# Patient Record
Sex: Female | Born: 1950
Health system: Southern US, Community
[De-identification: ages and names within clinical notes are randomized; demographics above are authoritative.]

## PROBLEM LIST (undated history)

## (undated) DIAGNOSIS — G5603 Carpal tunnel syndrome, bilateral upper limbs: Secondary | ICD-10-CM

## (undated) DIAGNOSIS — M7071 Other bursitis of hip, right hip: Secondary | ICD-10-CM

## (undated) DIAGNOSIS — H269 Unspecified cataract: Secondary | ICD-10-CM

## (undated) DIAGNOSIS — M2420 Disorder of ligament, unspecified site: Secondary | ICD-10-CM

## (undated) DIAGNOSIS — K219 Gastro-esophageal reflux disease without esophagitis: Secondary | ICD-10-CM

## (undated) DIAGNOSIS — M4800 Spinal stenosis, site unspecified: Secondary | ICD-10-CM

## (undated) DIAGNOSIS — M722 Plantar fascial fibromatosis: Secondary | ICD-10-CM

## (undated) DIAGNOSIS — N3010 Interstitial cystitis (chronic) without hematuria: Secondary | ICD-10-CM

## (undated) DIAGNOSIS — Z974 Presence of external hearing-aid: Secondary | ICD-10-CM

## (undated) DIAGNOSIS — IMO0002 Reserved for concepts with insufficient information to code with codable children: Secondary | ICD-10-CM

## (undated) DIAGNOSIS — R202 Paresthesia of skin: Secondary | ICD-10-CM

## (undated) DIAGNOSIS — M1990 Unspecified osteoarthritis, unspecified site: Secondary | ICD-10-CM

## (undated) DIAGNOSIS — F32A Depression, unspecified: Secondary | ICD-10-CM

## (undated) DIAGNOSIS — R011 Cardiac murmur, unspecified: Secondary | ICD-10-CM

## (undated) DIAGNOSIS — M75122 Complete rotator cuff tear or rupture of left shoulder, not specified as traumatic: Secondary | ICD-10-CM

## (undated) DIAGNOSIS — T7840XA Allergy, unspecified, initial encounter: Secondary | ICD-10-CM

## (undated) DIAGNOSIS — F419 Anxiety disorder, unspecified: Secondary | ICD-10-CM

## (undated) DIAGNOSIS — N281 Cyst of kidney, acquired: Secondary | ICD-10-CM

## (undated) DIAGNOSIS — M4322 Fusion of spine, cervical region: Secondary | ICD-10-CM

## (undated) DIAGNOSIS — M8580 Other specified disorders of bone density and structure, unspecified site: Secondary | ICD-10-CM

## (undated) HISTORY — PX: CYSTOSCOPY: SUR368

## (undated) HISTORY — PX: CARPAL TUNNEL RELEASE: SHX101

## (undated) HISTORY — PX: TONSILLECTOMY: SUR1361

## (undated) HISTORY — DX: Allergy, unspecified, initial encounter: T78.40XA

## (undated) HISTORY — DX: Depression, unspecified: F32.A

## (undated) HISTORY — DX: Complete rotator cuff tear or rupture of left shoulder, not specified as traumatic: M75.122

## (undated) HISTORY — PX: TYMPANOSTOMY TUBE PLACEMENT: SHX32

## (undated) HISTORY — DX: Spinal stenosis, site unspecified: M48.00

## (undated) HISTORY — DX: Carpal tunnel syndrome, bilateral upper limbs: G56.03

## (undated) HISTORY — DX: Disorder of ligament, unspecified site: M24.20

## (undated) HISTORY — DX: Unspecified cataract: H26.9

## (undated) HISTORY — DX: Gastro-esophageal reflux disease without esophagitis: K21.9

## (undated) HISTORY — PX: MYRINGOTOMY: SUR874

## (undated) HISTORY — DX: Other bursitis of hip, right hip: M70.71

## (undated) HISTORY — PX: TRIGGER FINGER RELEASE: SHX641

## (undated) HISTORY — DX: Interstitial cystitis (chronic) without hematuria: N30.10

## (undated) HISTORY — PX: LUMBAR LAMINECTOMY: SHX95

## (undated) HISTORY — PX: NASAL SINUS SURGERY: SHX719

## (undated) HISTORY — DX: Plantar fascial fibromatosis: M72.2

## (undated) HISTORY — PX: ANAL SPHINCTEROTOMY: SHX1140

## (undated) HISTORY — DX: Presence of external hearing-aid: Z97.4

## (undated) HISTORY — DX: Unspecified osteoarthritis, unspecified site: M19.90

## (undated) HISTORY — PX: SPINE SURGERY: SHX786

---

## 1996-02-18 HISTORY — PX: HEMORRHOID SURGERY: SHX153

## 1998-01-19 ENCOUNTER — Ambulatory Visit (HOSPITAL_COMMUNITY): Admission: RE | Admit: 1998-01-19 | Discharge: 1998-01-19 | Payer: Self-pay | Admitting: Internal Medicine

## 1998-01-19 ENCOUNTER — Encounter: Payer: Self-pay | Admitting: Internal Medicine

## 1998-05-07 ENCOUNTER — Other Ambulatory Visit: Admission: RE | Admit: 1998-05-07 | Discharge: 1998-05-07 | Payer: Self-pay | Admitting: Obstetrics and Gynecology

## 1999-05-23 ENCOUNTER — Encounter: Payer: Self-pay | Admitting: Internal Medicine

## 1999-05-23 ENCOUNTER — Ambulatory Visit (HOSPITAL_COMMUNITY): Admission: RE | Admit: 1999-05-23 | Discharge: 1999-05-23 | Payer: Self-pay | Admitting: Internal Medicine

## 1999-06-06 ENCOUNTER — Other Ambulatory Visit: Admission: RE | Admit: 1999-06-06 | Discharge: 1999-06-06 | Payer: Self-pay | Admitting: Obstetrics and Gynecology

## 2000-11-02 ENCOUNTER — Other Ambulatory Visit: Admission: RE | Admit: 2000-11-02 | Discharge: 2000-11-02 | Payer: Self-pay | Admitting: Obstetrics and Gynecology

## 2001-02-17 HISTORY — PX: ABDOMINAL HYSTERECTOMY: SHX81

## 2001-09-30 ENCOUNTER — Ambulatory Visit (HOSPITAL_COMMUNITY): Admission: RE | Admit: 2001-09-30 | Discharge: 2001-09-30 | Payer: Self-pay | Admitting: Gastroenterology

## 2001-10-01 ENCOUNTER — Emergency Department (HOSPITAL_COMMUNITY): Admission: EM | Admit: 2001-10-01 | Discharge: 2001-10-02 | Payer: Self-pay | Admitting: *Deleted

## 2001-10-21 ENCOUNTER — Other Ambulatory Visit: Admission: RE | Admit: 2001-10-21 | Discharge: 2001-10-21 | Payer: Self-pay | Admitting: Obstetrics and Gynecology

## 2001-11-16 ENCOUNTER — Inpatient Hospital Stay (HOSPITAL_COMMUNITY): Admission: RE | Admit: 2001-11-16 | Discharge: 2001-11-18 | Payer: Self-pay | Admitting: Obstetrics and Gynecology

## 2001-11-16 ENCOUNTER — Encounter (INDEPENDENT_AMBULATORY_CARE_PROVIDER_SITE_OTHER): Payer: Self-pay

## 2001-11-18 ENCOUNTER — Other Ambulatory Visit: Admission: RE | Admit: 2001-11-18 | Discharge: 2001-11-18 | Payer: Self-pay | Admitting: Obstetrics and Gynecology

## 2005-09-02 ENCOUNTER — Ambulatory Visit: Payer: Self-pay | Admitting: Licensed Clinical Social Worker

## 2005-09-23 ENCOUNTER — Ambulatory Visit: Payer: Self-pay | Admitting: Licensed Clinical Social Worker

## 2005-09-30 ENCOUNTER — Ambulatory Visit (HOSPITAL_COMMUNITY): Admission: RE | Admit: 2005-09-30 | Discharge: 2005-09-30 | Payer: Self-pay | Admitting: Dermatology

## 2005-09-30 ENCOUNTER — Ambulatory Visit: Payer: Self-pay | Admitting: Licensed Clinical Social Worker

## 2005-10-07 ENCOUNTER — Ambulatory Visit: Payer: Self-pay | Admitting: Licensed Clinical Social Worker

## 2005-10-14 ENCOUNTER — Ambulatory Visit: Payer: Self-pay | Admitting: Licensed Clinical Social Worker

## 2005-12-08 ENCOUNTER — Other Ambulatory Visit: Admission: RE | Admit: 2005-12-08 | Discharge: 2005-12-08 | Payer: Self-pay | Admitting: Obstetrics & Gynecology

## 2006-09-16 ENCOUNTER — Ambulatory Visit (HOSPITAL_COMMUNITY): Admission: RE | Admit: 2006-09-16 | Discharge: 2006-09-17 | Payer: Self-pay | Admitting: Specialist

## 2008-01-26 ENCOUNTER — Other Ambulatory Visit: Admission: RE | Admit: 2008-01-26 | Discharge: 2008-01-26 | Payer: Self-pay | Admitting: Obstetrics & Gynecology

## 2008-07-14 ENCOUNTER — Encounter: Admission: RE | Admit: 2008-07-14 | Discharge: 2008-07-14 | Payer: Self-pay | Admitting: Otolaryngology

## 2010-07-02 NOTE — Op Note (Signed)
Helen Glover, Helen Glover               ACCOUNT NO.:  [id number]   MEDICAL RECORD NO.:  [id number]          PATIENT TYPE:  AMB   LOCATION:  DAY                          FACILITY:  [facility]   PHYSICIAN:  Jene Every, M.D.    DATE OF BIRTH:  01-Mar-1950   DATE OF PROCEDURE:  09/16/2006  DATE OF DISCHARGE:                               OPERATIVE REPORT   PREOPERATIVE DIAGNOSIS:  Spinal stenosis L4-L5, bilateral.   POSTOPERATIVE DIAGNOSIS:  Spinal stenosis L4-L5, bilateral.   PROCEDURE PERFORMED:  Bilateral hemilaminotomy, lateral recess  decompression, foraminotomy.   ANESTHESIA:  General.   SURGEON:  Jene Every, M.D.   ASSISTANT:  Roma Schanz, P.A.-C.   BRIEF HISTORY:  The patient is a 60 year old with bilateral L5  radiculopathy and neurogenic claudication, EHL weakness and MRI  indicating stenosis at L4-L5 in the lateral recess fairly superiorly.  She had undergone epidural steroid injections with temporary relief.  Due to the persistence of her symptoms and EHL weakness, she is  indicated for bilateral decompression of L4-L5.  She had an asymptomatic  foraminal disc herniation at L3-L4, so we elected not to proceed with  that given the lack of symptomatology and the need for facetectomy and  fusion to address that disc.  The risks and benefits have been discussed  including wound infection, damage to neurovascular structures, CSF  leakage, epidural fibrosis, need for fusion in the future, anesthetic  complications, etc.   SURGICAL TECHNIQUE:  The patient was placed in the supine position.  After the induction of adequate anesthesia, 1 gram Kefzol, she was  placed prone on the Coleman frame.  All bony prominences were well  padded.  The lumbar region was prepped and draped in the usual fashion.  A 20 gauge spinal needle was utilized to localize the L4-L5 interspace  and confirmed with x-ray.  An incision was made from the spinous process  of L4 to L5.  The subcutaneous  tissue was dissected and electrocautery  was utilized to achieve hemostasis.  The dorsal lumbar fascia was  identified and divided in line with the skin incision.  The paraspinous  muscle was elevated from the lamina of L4 and L5.  The operating  microscope was draped and brought into the surgical field.   Attention ws turned towards the more symptomatic side on the left.  A  Leksell rongeur and 3 mm Kerrison were utilized to perform a  hemilaminotomy of the caudad edge of L4 to the cephalad attachment of  the ligamentum flavum, preserving an adequate pars distance.  I then  performed a foraminotomy of L5.  There was significant stenosis of the  lateral aspect of the sac and the L5 nerve root into the lateral recess.  I decompressed the lateral recess in the lower pedicle.  I gently  mobilized the nerve root medially.  There was a bulging disc but it was  not herniated.  Bipolar electrocautery was utilized to achieve  hemostasis.  A hockey stick probe was then placed freely in the foramen  of L4 and L5.  We passed a probe freely into the  next interspace without  any significant stenosis noted.  In a similar fashion, we decompressed  the right side performing a hemilaminotomy at the caudad edge of L4 and  the cephalad edge of L5 performing foraminotomies of L5, protecting the  nerve root, gently mobilizing them medially, decompressing the lateral  recess to the medial border of the pedicle, there was significant  compression of the L5 nerve root and the lateral recess.  This was  multifactorial.  In preserving the pars, I examined the disc and there  was no disc herniation.  Electrocautery was utilized to achieve  hemostasis.  A hockey stick probe passed freely in the foramen of L4 and  L5 following the decompression and up to the interspace at L3-L4 without  significant stenosis noted there.   After copious irrigation, bone wax was placed on the cancellous  surfaces.  I re-examined the  disc bilaterally, there was no disc  herniation, no pars defect.  We had obtained a confirmatory radiograph  with the McCullough retractor at the space at L4-L5.  We then removed  the [facility] retractor.  The paraspinous muscle were inspected with no  evidence of active bleeding.  The dorsal lumbar fascia was  reapproximated with #1 Vicryl figure-of-eight sutures, the subcutaneous  tissues reapproximated with 2-0 Vicryl simple sutures, and the skin was  reapproximated with 0 Prolene.  The wound was reinforced with Steri-  Strips.  A sterile dressing was applied.  He was placed supine on the  hospital bed, extubated without difficulty, and transported to the  recovery room in satisfactory condition.  The patient tolerated the  procedure well with no complications.      Jene Every, M.D.  Electronically Signed     JB/MEDQ  D:  09/16/2006  T:  09/16/2006  Job:  [id number]

## 2010-12-02 LAB — TYPE AND SCREEN
ABO/RH(D): O POS
Antibody Screen: NEGATIVE

## 2010-12-02 LAB — HEMOGLOBIN AND HEMATOCRIT, BLOOD
HCT: 40
Hemoglobin: 13.7

## 2010-12-02 LAB — PROTIME-INR
INR: 1
Prothrombin Time: 12.8

## 2010-12-02 LAB — ABO/RH: ABO/RH(D): O POS

## 2010-12-02 LAB — APTT: aPTT: 29

## 2011-10-07 ENCOUNTER — Encounter: Payer: Self-pay | Admitting: Gastroenterology

## 2011-11-16 ENCOUNTER — Ambulatory Visit: Payer: 59 | Admitting: Emergency Medicine

## 2011-11-16 VITALS — BP 108/64 | HR 78 | Temp 98.2°F | Resp 16 | Ht 59.0 in | Wt 118.0 lb

## 2011-11-16 DIAGNOSIS — IMO0002 Reserved for concepts with insufficient information to code with codable children: Secondary | ICD-10-CM

## 2011-11-16 MED ORDER — AMOXICILLIN-POT CLAVULANATE 875-125 MG PO TABS: 1.0000 | ORAL_TABLET | Freq: Two times a day (BID) | ORAL | Status: DC

## 2011-11-16 NOTE — Progress Notes (Signed)
[facility] [address], [place] [address] [phone]  Date:  11/16/2011   Name:  Helen Glover   DOB:  1950-07-21   MRN:  [id number]  PCP:  Nadean Corwin, MD    Chief Complaint: Paronychia and URI   History of Present Illness:  Helen Glover is a 61 y.o. very pleasant female patient who presents with the following:  Has been biting her cuticles, particularly on the right third and middle fingers.  Has pain in third finger associated with redness and swelling around the proximal nail fold.  No drainage, no cellulitis or lymphangitis.  There is no problem list on file for this patient.   No past medical history on file.  No past surgical history on file.  History  Substance Use Topics  . Smoking status: Former Games developer  . Smokeless tobacco: Not on file  . Alcohol Use: Not on file    No family history on file.  Allergies  Allergen Reactions  . Codeine Hives  . Sulfa Antibiotics Nausea And Vomiting    Medication list has been reviewed and updated.  Current Outpatient Prescriptions on File Prior to Visit  Medication Sig Dispense Refill  . omeprazole (PRILOSEC) 40 MG capsule Take 40 mg by mouth daily.        Review of Systems:  As per HPI, otherwise negative.    Physical Examination: Filed Vitals:   11/16/11 1151  BP: 108/64  Pulse: 78  Temp: 98.2 F (36.8 C)  Resp: 16   Filed Vitals:   11/16/11 1151  Height: 4\' 11"  (1.499 m)  Weight: 118 lb (53.524 kg)   Body mass index is 23.83 kg/(m^2). Ideal Body Weight: Weight in (lb) to have BMI = 25: 123.5    GEN: WDWN, NAD, Non-toxic, Alert & Oriented x 3 HEENT: Atraumatic, Normocephalic.  Ears and Nose: No external deformity. EXTR: No clubbing/cyanosis/edema NEURO: Normal gait.  PSYCH: Normally interactive. Conversant. Not depressed or anxious appearing.  Calm demeanor.  Hand:  Paronychia right third finger.  NATI.  Assessment and Plan: Paronychia augmentin Warm  soaks Follow up as needed.  Carmelina Dane, MD   I have reviewed and agree with documentation. Robert P. Merla Riches, M.D.

## 2011-11-17 ENCOUNTER — Telehealth: Payer: Self-pay

## 2011-11-17 ENCOUNTER — Ambulatory Visit: Payer: 59 | Admitting: Physician Assistant

## 2011-11-17 VITALS — BP 120/80 | HR 81 | Temp 98.0°F | Resp 18 | Wt 122.0 lb

## 2011-11-17 DIAGNOSIS — M79609 Pain in unspecified limb: Secondary | ICD-10-CM

## 2011-11-17 DIAGNOSIS — M79646 Pain in unspecified finger(s): Secondary | ICD-10-CM

## 2011-11-17 DIAGNOSIS — K219 Gastro-esophageal reflux disease without esophagitis: Secondary | ICD-10-CM

## 2011-11-17 DIAGNOSIS — IMO0002 Reserved for concepts with insufficient information to code with codable children: Secondary | ICD-10-CM

## 2011-11-17 DIAGNOSIS — N3010 Interstitial cystitis (chronic) without hematuria: Secondary | ICD-10-CM | POA: Insufficient documentation

## 2011-11-17 NOTE — Progress Notes (Signed)
Subjective:    Patient ID: Helen Glover, female    DOB: Apr 16, 1950, 61 y.o.   MRN: [id number]  HPI This 61 y.o. female presents for evaluation of a paronychia.  She was seen yesterday  With pain and swelling of the right middle finger and was placed on Augmentin.  She had no improvement overnight and this morning noted an area of "white" next to the nail at the area of maximum tenderness.  She called for advice, and was advised to come in for evaluation.  She is tolerating the medication without any difficulty.  No fever, chills.     Review of Systems As above.   Past Medical History  Diagnosis Date  . GERD (gastroesophageal reflux disease)   . Interstitial cystitis   . Eagle's syndrome   . Spinal stenosis     Past Surgical History  Procedure Date  . Abdominal hysterectomy   . Cesarean section   . Myringotomy   . Tonsillectomy     LEFT only, due to Eagle Syndrome  . Lumbar laminectomy   . Carpal tunnel release     LEFT  . Trigger finger release     RIGHT  . Anal sphincterotomy   . Nasal sinus surgery     Prior to Admission medications   Medication Sig Start Date End Date Taking? Authorizing Provider  amoxicillin-clavulanate (AUGMENTIN) 875-125 MG per tablet Take 1 tablet by mouth 2 (two) times daily. 11/16/11  Yes Phillips Odor, MD  omeprazole (PRILOSEC) 40 MG capsule Take 40 mg by mouth daily.   Yes Historical Provider, MD    Allergies  Allergen Reactions  . Codeine Hives  . Sulfa Antibiotics Nausea And Vomiting    History   Social History  . Marital Status: Married    Spouse Name: Tinnie Gens    Number of Children: 2  . Years of Education: N/A   Occupational History  . OWNER     [employer]   Social History Main Topics  . Smoking status: Former Games developer  . Smokeless tobacco: Never Used  . Alcohol Use: 4.2 - 4.8 oz/week    7-8 Glasses of wine per week  . Drug Use: No  . Sexually Active: Yes -- Female partner(s)    Birth Control/ Protection:  Surgical   Other Topics Concern  . Not on file   Social History Narrative  . No narrative on file    Family History  Problem Relation Age of Onset  . Diabetes Mother   . Hypertension Mother   . Hypertension Brother   . Hyperlipidemia Brother        Objective:   Physical Exam  Blood pressure 120/80, pulse 81, temperature 98 F (36.7 C), temperature source Oral, resp. rate 18, weight 122 lb (55.339 kg). There is no height on file to calculate BMI. (height yesterday measured 4'11") Well-developed, well nourished WF who is awake, alert and oriented, in NAD. HEENT: [place]/AT, sclera and conjunctiva are clear.   Lungs: normal effort. Extremities: no cyanosis, clubbing.  Mild edema, erythema and moderate-severe tenderness with palpation of the distal middle finger.   Skin: warm and dry, as above. Psychologic: good mood and appropriate affect, normal speech and behavior.  PROCEDURE: Verbal consent obtained.  Prep with Betadine.  11 blade to incise.  Moderate purulence expressed.  Culture obtained.  Area cleansed.  Small bandage applied.     Assessment & Plan:   1. Paronychia  Wound culture  2. Pain in finger  Continue the antibiotic and local wound care.  Anticipatory guidance provided.

## 2011-11-17 NOTE — Telephone Encounter (Signed)
The patient called with concerns about her finger infection.  The patient states she has some whitish-greenish areas near the nail and would like a return call to discuss this.  Please call the patient on her cell phone at [phone].

## 2011-11-17 NOTE — Patient Instructions (Signed)
Continue with the current care of your finger.

## 2011-11-17 NOTE — Telephone Encounter (Signed)
I called patient back, she was seen yesterday for this, she is getting worse. The area of paronychia is now discolored. I advised her to come in for this, it may need to be opened.

## 2011-11-21 LAB — WOUND CULTURE
Gram Stain: NONE SEEN
Gram Stain: NONE SEEN
Gram Stain: NONE SEEN

## 2012-04-03 ENCOUNTER — Other Ambulatory Visit: Payer: Self-pay

## 2012-06-03 ENCOUNTER — Encounter: Payer: Self-pay | Admitting: Gastroenterology

## 2012-11-17 HISTORY — PX: TONSILLECTOMY: SUR1361

## 2012-12-03 ENCOUNTER — Encounter: Payer: Self-pay | Admitting: Internal Medicine

## 2012-12-03 ENCOUNTER — Encounter: Payer: Self-pay | Admitting: Physician Assistant

## 2012-12-21 ENCOUNTER — Ambulatory Visit: Payer: 59 | Admitting: Physician Assistant

## 2012-12-21 ENCOUNTER — Encounter: Payer: Self-pay | Admitting: Physician Assistant

## 2012-12-21 VITALS — BP 128/70 | HR 72 | Temp 97.7°F | Resp 16 | Ht 59.0 in | Wt 120.0 lb

## 2012-12-21 DIAGNOSIS — Z87898 Personal history of other specified conditions: Secondary | ICD-10-CM | POA: Insufficient documentation

## 2012-12-21 DIAGNOSIS — R7303 Prediabetes: Secondary | ICD-10-CM | POA: Insufficient documentation

## 2012-12-21 DIAGNOSIS — E559 Vitamin D deficiency, unspecified: Secondary | ICD-10-CM | POA: Insufficient documentation

## 2012-12-21 DIAGNOSIS — I10 Essential (primary) hypertension: Secondary | ICD-10-CM | POA: Insufficient documentation

## 2012-12-21 DIAGNOSIS — Z0000 Encounter for general adult medical examination without abnormal findings: Secondary | ICD-10-CM

## 2012-12-21 DIAGNOSIS — E785 Hyperlipidemia, unspecified: Secondary | ICD-10-CM | POA: Insufficient documentation

## 2012-12-21 LAB — CBC WITH DIFFERENTIAL/PLATELET
Basophils Absolute: 0 10*3/uL (ref 0.0–0.1)
Basophils Relative: 1 % (ref 0–1)
Eosinophils Absolute: 0.1 10*3/uL (ref 0.0–0.7)
Eosinophils Relative: 2 % (ref 0–5)
HCT: 40.6 % (ref 36.0–46.0)
Hemoglobin: 13.5 g/dL (ref 12.0–15.0)
Lymphocytes Relative: 34 % (ref 12–46)
Lymphs Abs: 2.1 10*3/uL (ref 0.7–4.0)
MCH: 31.7 pg (ref 26.0–34.0)
MCHC: 33.3 g/dL (ref 30.0–36.0)
MCV: 95.3 fL (ref 78.0–100.0)
Monocytes Absolute: 0.5 10*3/uL (ref 0.1–1.0)
Monocytes Relative: 8 % (ref 3–12)
Neutro Abs: 3.5 10*3/uL (ref 1.7–7.7)
Neutrophils Relative %: 55 % (ref 43–77)
Platelets: 248 10*3/uL (ref 150–400)
RBC: 4.26 MIL/uL (ref 3.87–5.11)
RDW: 13 % (ref 11.5–15.5)
WBC: 6.2 10*3/uL (ref 4.0–10.5)

## 2012-12-21 LAB — BASIC METABOLIC PANEL WITH GFR
BUN: 14 mg/dL (ref 6–23)
CO2: 28 mEq/L (ref 19–32)
Calcium: 9.3 mg/dL (ref 8.4–10.5)
Chloride: 103 mEq/L (ref 96–112)
Creat: 0.65 mg/dL (ref 0.50–1.10)
GFR, Est African American: >89 mL/min
GFR, Est Non African American: >89 mL/min
Glucose, Bld: 98 mg/dL (ref 70–99)
Potassium: 4.3 mEq/L (ref 3.5–5.3)
Sodium: 139 mEq/L (ref 135–145)

## 2012-12-21 LAB — LIPID PANEL
Cholesterol: 218 mg/dL — ABNORMAL HIGH (ref 0–200)
HDL: 99 mg/dL (ref >39)
LDL Cholesterol: 108 mg/dL — ABNORMAL HIGH (ref 0–99)
Total CHOL/HDL Ratio: 2.2 Ratio
Triglycerides: 57 mg/dL (ref <150)
VLDL: 11 mg/dL (ref 0–40)

## 2012-12-21 LAB — HEPATIC FUNCTION PANEL
ALT: 16 U/L (ref 0–35)
AST: 18 U/L (ref 0–37)
Albumin: 4.4 g/dL (ref 3.5–5.2)
Alkaline Phosphatase: 52 U/L (ref 39–117)
Bilirubin, Direct: 0.1 mg/dL (ref 0.0–0.3)
Indirect Bilirubin: 0.4 mg/dL (ref 0.0–0.9)
Total Bilirubin: 0.5 mg/dL (ref 0.3–1.2)
Total Protein: 6.8 g/dL (ref 6.0–8.3)

## 2012-12-21 LAB — IRON AND TIBC
%SAT: 30 % (ref 20–55)
Iron: 93 ug/dL (ref 42–145)
TIBC: 308 ug/dL (ref 250–470)
UIBC: 215 ug/dL (ref 125–400)

## 2012-12-21 LAB — MAGNESIUM: Magnesium: 2 mg/dL (ref 1.5–2.5)

## 2012-12-21 LAB — HEMOGLOBIN A1C
Hgb A1c MFr Bld: 5.7 % — ABNORMAL HIGH (ref <5.7)
Mean Plasma Glucose: 117 mg/dL — ABNORMAL HIGH (ref <117)

## 2012-12-21 NOTE — Progress Notes (Signed)
Patient ID: Helen Glover, female   DOB: 12/29/1950, 62 y.o.   MRN: [id number]  Complete Physical HPI Patient presents for complete physical.  Patient's blood pressure has been controlled at home. Patient denies chest pain, shortness of breath, dizziness. Patient's cholesterol is diet controlled and LDL was 107 last visit, she denies myalgias . she has been working on diet and exercise for prediabetes, denies changes in vision, polys, and paresthesias. Last A1C in office was 5.5 and at goal.  .  Current outpatient prescriptions:Biotin 1 MG CAPS, Take by mouth., Disp: , Rfl: ;  Calcium Carbonate-Vitamin D (CALCIUM-VITAMIN D) 500-200 MG-UNIT per tablet, Take 1 tablet by mouth daily., Disp: , Rfl: ;  estradiol (VIVELLE-DOT) 0.0375 MG/24HR, Place 1 patch onto the skin 2 (two) times a week., Disp: , Rfl: ;  Flaxseed, Linseed, (FLAXSEED OIL PO), Take 1,000 mg by mouth daily., Disp: , Rfl:  meloxicam (MOBIC) 15 MG tablet, Take 15 mg by mouth daily., Disp: , Rfl: ;  pyridoxine (B-6) 500 MG tablet, Take 500 mg by mouth daily., Disp: , Rfl: ;  amoxicillin-clavulanate (AUGMENTIN) 875-125 MG per tablet, Take 1 tablet by mouth 2 (two) times daily., Disp: 20 tablet, Rfl: 0;  cholecalciferol (VITAMIN D) 1000 UNITS tablet, Take 3,000 Units by mouth daily. , Disp: , Rfl:  omeprazole (PRILOSEC) 40 MG capsule, Take 40 mg by mouth daily., Disp: , Rfl: ;  vitamin B-12 (CYANOCOBALAMIN) 1000 MCG tablet, Take 1,000 mcg by mouth daily., Disp: , Rfl: ;  vitamin C (ASCORBIC ACID) 500 MG tablet, Take 500 mg by mouth daily., Disp: , Rfl: . Immunization History  Administered Date(s) Administered  . DTaP 10/04/2010  . Influenza-Generic 11/29/2012  . Pneumococcal-Generic 12/04/2002  . Zoster 12/03/2005    Allergies  Allergen Reactions  . Codeine Hives  . Sulfa Antibiotics Nausea And Vomiting   Past Medical History  Diagnosis Date  . Interstitial cystitis   . Eagle's syndrome   . Spinal stenosis   . Prediabetes   .  Vitamin D deficiency   . Hyperlipidemia   . Hypertension     remote history diet controlled  . GERD (gastroesophageal reflux disease)    Past Surgical History  Procedure Laterality Date  . Cesarean section    . Myringotomy    . Tonsillectomy      LEFT only, due to Eagle Syndrome  . Lumbar laminectomy    . Carpal tunnel release      LEFT  . Trigger finger release      RIGHT  . Anal sphincterotomy    . Nasal sinus surgery    . Abdominal hysterectomy  2003  . Hemorrhoid surgery N/A 1998   Family History  Problem Relation Age of Onset  . Diabetes Mother   . Hypertension Mother   . Hyperlipidemia Mother   . Hypertension Brother   . Hyperlipidemia Brother   . Prostate cancer Father    History   Social History  . Marital Status: Married    Spouse Name: Tinnie Gens    Number of Children: 2  . Years of Education: N/A   Occupational History  . OWNER     [employer]   Social History Main Topics  . Smoking status: Former Smoker -- 0.50 packs/day for 15 years    Quit date: 12/04/1986  . Smokeless tobacco: Never Used  . Alcohol Use: 4.2 - 4.8 oz/week    7-8 Glasses of wine per week  . Drug Use: No  . Sexual Activity: Yes  Partners: Male    Pharmacist, hospital Protection: Surgical   Other Topics Concern  . Not on file   Social History Narrative  . No narrative on file   ROS Constitutional: Denies fever, chills, weight loss/gain, headaches, insomnia, fatigue, night sweats, and change in appetite. Eyes: Denies redness, blurred vision, diplopia, discharge, itchy, watery eyes.  ENT: Denies discharge, congestion, post nasal drip, epistaxis, sore throat, earache, hearing loss, dental pain, Tinnitus, Vertigo, Sinus pain, snoring.  Cardio: Denies chest pain, palpitations, irregular heartbeat, syncope, dyspnea, diaphoresis, orthopnea, PND, claudication, edema Respiratory: denies cough, dyspnea, DOE, pleurisy, hoarseness, laryngitis, wheezing.  Gastrointestinal: Patient  complains of some pills getting caught on right side of throat, no food or liquid. Denies  pain, cramps, nausea, vomiting, bloating, diarrhea, constipation, hematemesis, melena, hematochezia, jaundice, hemorrhoids Genitourinary: Denies dysuria, frequency, urgency, nocturia, hesitancy, discharge, hematuria, flank pain, vaginal dryness.  Breast: defer OB/GYN Musculoskeletal: Has paresthisias bilateral hands, seeing ortho for cervical stenosis. Otherwise denies myalgias.  Skin: Denies puritis, rash, hives, warts, acne, eczema, changing in skin lesion Neuro: Weakness, tremor, incoordination, spasms, pain Psychiatric: Denies confusion, memory loss, sensory loss Endocrine: Denies change in weight, skin, hair change, nocturia, and paresthesia, Diabetic Polys, visual blurring, hyper /hypo glycemic episodes.  Heme/Lymph: Excessive bleeding, bruising, enlarged lymph nodes Filed Vitals:   12/21/12 1044  BP: 128/70  Pulse: 72  Temp: 97.7 F (36.5 C)  Resp: 16   Physical Exam: General Appearance: Well nourished, in no apparent distress. Eyes: PERRLA, EOMs, conjunctiva no swelling or erythema, normal fundi and vessels. Sinuses: No Frontal/maxillary tenderness ENT/Mouth: Right tonsil swollen and enlarged without exudate. Ext aud canals clear, normal light reflex with TMs without erythema, bulging. Nares clear with no erythema, swelling, mucus on turbinates. No ulcers, cracking, on lips. Good dentition. No erythema, swelling, or exudate on post pharynx. Tongue normal, non-obstructing.  Hearing normal.  Neck: Supple, thyroid normal. No bruits or JVD. Respiratory: Respiratory effort normal, BS equal bilaterally without rales, rhonci, wheezing or stridor. Cardio: Heart sounds normal, regular rate and rhythm without murmurs, rubs or gallops. Peripheral pulses brisk and equal bilaterally, without edema. No aortic or femoral bruits. Chest: symmetric, with normal excursions and percussion. Breasts: Defer Dr.  Hyacinth Meeker Abdomen: Flat, soft, with bowl sounds. Nontender, no guarding, rebound, hernias, masses, or organomegaly.  Rectal: defer- getting colonoscopy with Dr. Madilyn Fireman.  Lymphatics: Non tender without lymphadenopathy.  Genitourinary: Defer Dr. Corrie Mckusick Musculoskeletal: Full ROM all peripheral extremities, joint stability, 5/5 strength, and normal gait. Skin: Defer Dr. Danella Deis  Neuro: Cranial nerves intact, reflexes equal bilaterally. Normal muscle tone, no cerebellar symptoms. Sensation intact.  Pysch: Awake and oriented X 3, normal affect, Insight and Judgment appropriate.   Assessment and Plan Patient Active Problem List   Diagnosis Date Noted  . Hyperlipidemia   . Hypertension   . Prediabetes   . Vitamin D deficiency   . Spinal stenosis   . GERD (gastroesophageal reflux disease) 11/17/2011  . Interstitial cystitis    Will schedule for 5 year colonoscopy. No symptoms but will do 5 year this time due to family history of polyps.  HTN- diet controlled Cholesterol- check Lipids, diet controlled.  Pre DM diet controlled. Check A1C Vitamin D Def- on supplements check Vit level GERD cont Prilosec Interstitial cystitis- continue diet controlled.

## 2012-12-22 LAB — URINALYSIS, COMPLETE
Bilirubin Urine: NEGATIVE
Casts: NONE SEEN
Crystals: NONE SEEN
Glucose, UA: NEGATIVE mg/dL
Hgb urine dipstick: NEGATIVE
Ketones, ur: NEGATIVE mg/dL
Leukocytes, UA: NEGATIVE
Nitrite: NEGATIVE
Protein, ur: NEGATIVE mg/dL
Specific Gravity, Urine: 1.007 (ref 1.005–1.030)
Urobilinogen, UA: 0.2 mg/dL (ref 0.0–1.0)
pH: 7 (ref 5.0–8.0)

## 2012-12-22 LAB — VITAMIN D 25 HYDROXY (VIT D DEFICIENCY, FRACTURES): Vit D, 25-Hydroxy: 66 ng/mL (ref 30–89)

## 2012-12-22 LAB — MICROALBUMIN / CREATININE URINE RATIO
Creatinine, Urine: 36.7 mg/dL
Microalb Creat Ratio: 13.6 mg/g (ref 0.0–30.0)
Microalb, Ur: 0.5 mg/dL (ref 0.00–1.89)

## 2012-12-22 LAB — TSH: TSH: 1.497 u[IU]/mL (ref 0.350–4.500)

## 2012-12-22 LAB — VITAMIN B12: Vitamin B-12: 1386 pg/mL — ABNORMAL HIGH (ref 211–911)

## 2012-12-22 LAB — INSULIN, FASTING: Insulin fasting, serum: 4 u[IU]/mL (ref 3–28)

## 2012-12-22 LAB — FERRITIN: Ferritin: 66 ng/mL (ref 10–291)

## 2012-12-28 ENCOUNTER — Telehealth: Payer: Self-pay

## 2012-12-28 NOTE — Telephone Encounter (Signed)
Patient aware of 11/4 lab results and instructions

## 2013-01-04 ENCOUNTER — Other Ambulatory Visit (INDEPENDENT_AMBULATORY_CARE_PROVIDER_SITE_OTHER): Payer: Self-pay | Admitting: Otolaryngology

## 2013-01-04 DIAGNOSIS — M542 Cervicalgia: Secondary | ICD-10-CM

## 2013-01-11 ENCOUNTER — Ambulatory Visit
Admission: RE | Admit: 2013-01-11 | Discharge: 2013-01-11 | Disposition: A | Discharge status: Home / Self Care | Payer: 59 | Source: Ambulatory Visit | Attending: Otolaryngology | Admitting: Otolaryngology

## 2013-01-11 DIAGNOSIS — M542 Cervicalgia: Secondary | ICD-10-CM

## 2013-01-11 MED ORDER — IOHEXOL 300 MG/ML  SOLN
75.0000 mL | Freq: Once | INTRAMUSCULAR | Status: AC | PRN
  Administered 2013-01-11: 75 mL via INTRAVENOUS

## 2013-01-26 ENCOUNTER — Other Ambulatory Visit: Payer: Self-pay | Admitting: Internal Medicine

## 2013-01-26 DIAGNOSIS — E079 Disorder of thyroid, unspecified: Secondary | ICD-10-CM

## 2013-01-31 ENCOUNTER — Inpatient Hospital Stay: Admission: RE | Admit: 2013-01-31 | Payer: 59 | Source: Ambulatory Visit

## 2013-02-02 ENCOUNTER — Ambulatory Visit
Admission: RE | Admit: 2013-02-02 | Discharge: 2013-02-02 | Disposition: A | Discharge status: Home / Self Care | Payer: 59 | Source: Ambulatory Visit | Attending: Internal Medicine | Admitting: Internal Medicine

## 2013-02-02 DIAGNOSIS — E079 Disorder of thyroid, unspecified: Secondary | ICD-10-CM

## 2013-02-17 DIAGNOSIS — T884XXA Failed or difficult intubation, initial encounter: Secondary | ICD-10-CM

## 2013-02-17 HISTORY — DX: Failed or difficult intubation, initial encounter: T88.4XXA

## 2013-04-05 ENCOUNTER — Other Ambulatory Visit: Payer: Self-pay | Admitting: Neurosurgery

## 2013-04-05 DIAGNOSIS — M5412 Radiculopathy, cervical region: Secondary | ICD-10-CM

## 2013-04-08 ENCOUNTER — Ambulatory Visit
Admission: RE | Admit: 2013-04-08 | Discharge: 2013-04-08 | Disposition: A | Discharge status: Home / Self Care | Payer: 59 | Source: Ambulatory Visit | Attending: Neurosurgery | Admitting: Neurosurgery

## 2013-04-08 VITALS — BP 107/58 | HR 51

## 2013-04-08 DIAGNOSIS — M5412 Radiculopathy, cervical region: Secondary | ICD-10-CM

## 2013-04-08 DIAGNOSIS — M4800 Spinal stenosis, site unspecified: Secondary | ICD-10-CM

## 2013-04-08 MED ORDER — ONDANSETRON HCL 4 MG/2ML IJ SOLN
4.0000 mg | Freq: Once | INTRAMUSCULAR | Status: AC
  Administered 2013-04-08: 4 mg via INTRAMUSCULAR

## 2013-04-08 MED ORDER — IOHEXOL 300 MG/ML  SOLN
10.0000 mL | Freq: Once | INTRAMUSCULAR | Status: AC | PRN
  Administered 2013-04-08: 10 mL via INTRATHECAL

## 2013-04-08 MED ORDER — MEPERIDINE HCL 100 MG/ML IJ SOLN
75.0000 mg | Freq: Once | INTRAMUSCULAR | Status: AC
  Administered 2013-04-08: 75 mg via INTRAMUSCULAR

## 2013-04-08 MED ORDER — DIAZEPAM 5 MG PO TABS
5.0000 mg | ORAL_TABLET | Freq: Once | ORAL | Status: AC
  Administered 2013-04-08: 5 mg via ORAL

## 2013-04-08 NOTE — Discharge Instructions (Signed)

## 2013-04-15 ENCOUNTER — Other Ambulatory Visit: Payer: Self-pay | Admitting: Gynecology

## 2013-04-15 NOTE — Telephone Encounter (Signed)
eScribe request from [facility] for refill on MINIVELLE Last filled - 04/13/12 X 1 YEAR Last AEX - 04/13/12 Next AEX - 06/23/13 Last MMG - 01/28/13, negative RX sent until AEX.

## 2013-04-19 ENCOUNTER — Other Ambulatory Visit: Payer: Self-pay | Admitting: Neurosurgery

## 2013-04-19 DIAGNOSIS — M47812 Spondylosis without myelopathy or radiculopathy, cervical region: Secondary | ICD-10-CM

## 2013-04-27 LAB — HM COLONOSCOPY

## 2013-05-04 ENCOUNTER — Other Ambulatory Visit: Payer: Self-pay | Admitting: Neurosurgery

## 2013-05-04 ENCOUNTER — Ambulatory Visit
Admission: RE | Admit: 2013-05-04 | Discharge: 2013-05-04 | Disposition: A | Discharge status: Home / Self Care | Payer: 59 | Source: Ambulatory Visit | Attending: Neurosurgery | Admitting: Neurosurgery

## 2013-05-04 DIAGNOSIS — M47812 Spondylosis without myelopathy or radiculopathy, cervical region: Secondary | ICD-10-CM

## 2013-05-04 MED ORDER — TRIAMCINOLONE ACETONIDE 40 MG/ML IJ SUSP (RADIOLOGY)
60.0000 mg | Freq: Once | INTRAMUSCULAR | Status: AC
  Administered 2013-05-04: 60 mg via INTRA_ARTICULAR

## 2013-05-04 MED ORDER — IOHEXOL 300 MG/ML  SOLN
1.0000 mL | Freq: Once | INTRAMUSCULAR | Status: AC | PRN
  Administered 2013-05-04: 1 mL via INTRA_ARTICULAR

## 2013-05-04 MED ORDER — DEXAMETHASONE SODIUM PHOSPHATE 4 MG/ML IJ SOLN: 4.0000 mg | Freq: Once | INTRAMUSCULAR | Status: DC

## 2013-05-04 NOTE — Discharge Instructions (Signed)

## 2013-06-20 ENCOUNTER — Encounter: Payer: Self-pay | Admitting: Physician Assistant

## 2013-06-23 ENCOUNTER — Ambulatory Visit: Payer: 59 | Admitting: Obstetrics & Gynecology

## 2013-06-27 ENCOUNTER — Encounter: Payer: Self-pay | Admitting: Obstetrics & Gynecology

## 2013-06-27 ENCOUNTER — Ambulatory Visit (INDEPENDENT_AMBULATORY_CARE_PROVIDER_SITE_OTHER): Payer: 59 | Admitting: Obstetrics & Gynecology

## 2013-06-27 VITALS — BP 130/72 | HR 68 | Resp 16 | Ht 58.5 in | Wt 121.4 lb

## 2013-06-27 DIAGNOSIS — Z124 Encounter for screening for malignant neoplasm of cervix: Secondary | ICD-10-CM

## 2013-06-27 DIAGNOSIS — Z01419 Encounter for gynecological examination (general) (routine) without abnormal findings: Secondary | ICD-10-CM

## 2013-06-27 MED ORDER — NORTRIPTYLINE HCL 10 MG/5ML PO SOLN: 2.5000 mg | Freq: Every day | ORAL | Status: DC

## 2013-06-27 MED ORDER — ESTRADIOL 0.0375 MG/24HR TD PTTW: 1.0000 | MEDICATED_PATCH | TRANSDERMAL | Status: DC

## 2013-06-27 NOTE — Patient Instructions (Signed)

## 2013-06-27 NOTE — Progress Notes (Signed)
63 y.o. [id number] MarriedCaucasianF here for annual exam.  IC is bothering pt more recently.  Not doing a strict IC diet but she really wants some iced tea from time to time.  Needs new rx for nortriptyline.  Uses a liquid 10mg /82ml.    No vaginal bleeding.  Full blood work 12/21/12 with Christel Mormon, PA.    Biggest issue this past year is neck pain.  Has seen Dr. Durene Cal.  Had CT myelogram and now injections.  This has helped.  She is waiting to see how long the injections will last.     Patient's last menstrual period was 10/18/2001.          Sexually active: no rarely The current method of family planning is status post hysterectomy.    Exercising: yes  weights, eliptical, walking, biking, and swimming Smoker:  Former smoker  Health Maintenance: Pap:  02/04/10 WNL History of abnormal Pap:  yes MMG:  01/28/13 normal Colonoscopy:  04/21/13-repeat in 5-10 years, Dr. Amedeo Plenty BMD:   01/27/12 TDaP:  09/24/10 Screening Labs: PCP, Hb today: PCP, Urine today: PCP   reports that she quit smoking about 26 years ago. She has never used smokeless tobacco. She reports that she drinks about 5 - 6 ounces of alcohol per week. She reports that she does not use illicit drugs.  Past Medical History  Diagnosis Date  . Interstitial cystitis   . Eagle's syndrome   . Spinal stenosis   . Prediabetes   . Vitamin D deficiency   . Hyperlipidemia   . Hypertension     remote history diet controlled  . GERD (gastroesophageal reflux disease)   . Anemia   . Plantar fasciitis   . Carpal tunnel syndrome, bilateral   . Arthritis     in neck    Past Surgical History  Procedure Laterality Date  . Cesarean section    . Myringotomy    . Tonsillectomy      LEFT only, due to Eagle Syndrome  . Lumbar laminectomy    . Carpal tunnel release      LEFT  . Trigger finger release      RIGHT  . Anal sphincterotomy    . Nasal sinus surgery    . Abdominal hysterectomy  2003    BSO  . Hemorrhoid surgery N/A 1998  .  Cystoscopy    . Colonoscopy      internal hemorrhoids  . Cortisone injection      in neck  . Tonsillectomy  10/14    right side due to Eagles syndrome  . Tympanostomy tube placement      Current Outpatient Prescriptions  Medication Sig Dispense Refill  . Biotin 1 MG CAPS Take by mouth.      . Calcium Carbonate-Vitamin D (CALCIUM-VITAMIN D) 500-200 MG-UNIT per tablet Take 1 tablet by mouth daily. Calcium magnesium      . cholecalciferol (VITAMIN D) 1000 UNITS tablet Take 3,000 Units by mouth daily.       . Flaxseed, Linseed, (FLAXSEED OIL PO) Take 1,000 mg by mouth daily.      Marland Kitchen glucosamine-chondroitin 500-400 MG tablet Take 1 tablet by mouth 3 (three) times daily.      . meloxicam (MOBIC) 15 MG tablet Take 15 mg by mouth daily.      . metroNIDAZOLE (METROGEL) 1 % gel Apply topically daily.      Marland Kitchen MINIVELLE 0.0375 MG/24HR APPLY 1 PATCH TWICE A WEEK AS DIRECTED  24 patch  0  .  omeprazole (PRILOSEC) 40 MG capsule Take 40 mg by mouth daily.      Marland Kitchen pyridoxine (B-6) 500 MG tablet Take 500 mg by mouth daily.      . vitamin B-12 (CYANOCOBALAMIN) 1000 MCG tablet Take 1,000 mcg by mouth daily.      . vitamin C (ASCORBIC ACID) 500 MG tablet Take 500 mg by mouth daily.      Marland Kitchen FINACEA 15 % cream        No current facility-administered medications for this visit.    Family History  Problem Relation Age of Onset  . Diabetes Mother   . Hypertension Mother   . Hyperlipidemia Mother   . Hypertension Brother   . Hyperlipidemia Brother   . Prostate cancer Father     Bone  . Osteoporosis Mother     ROS:  Pertinent items are noted in HPI.  Otherwise, a comprehensive ROS was negative.  Exam:   BP 130/72  Pulse 68  Resp 16  Ht 4' 10.5" (1.486 m)  Wt 121 lb 6.4 oz (55.067 kg)  BMI 24.94 kg/m2  [name] 10/18/2001  Weight change: +2#   Height: 4' 10.5" (148.6 cm)  Ht Readings from Last 3 Encounters:  06/27/13 4' 10.5" (1.486 m)  12/21/12 4\' 11"  (1.499 m)  11/16/11 4\' 11"  (1.499 m)     General appearance: alert, cooperative and appears stated age Head: Normocephalic, without obvious abnormality, atraumatic Neck: no adenopathy, supple, symmetrical, trachea midline and thyroid normal to inspection and palpation Lungs: clear to auscultation bilaterally Breasts: normal appearance, no masses or tenderness Heart: regular rate and rhythm Abdomen: soft, non-tender; bowel sounds normal; no masses,  no organomegaly Extremities: extremities normal, atraumatic, no cyanosis or edema Skin: Skin color, texture, turgor normal. No rashes or lesions Lymph nodes: Cervical, supraclavicular, and axillary nodes normal. No abnormal inguinal nodes palpated Neurologic: Grossly normal   Pelvic: External genitalia:  no lesions              Urethra:  normal appearing urethra with no masses, tenderness or lesions              Bartholins and Skenes: normal                 Vagina: normal appearing vagina with normal color and discharge, no lesions              Cervix: no lesions              Pap taken: yes Bimanual Exam:  Uterus:  uterus absent              Adnexa: no mass, fullness, tenderness               Rectovaginal: Confirms               Anus:  normal sphincter tone, no lesions  A:  Well Woman with normal exam PMP, on HRT H/O TAh/BSO on estrogen alone Grade C breast density Arthritis in neck Mild osteopenia I.C.  P:   Mammogram yearly.  D/W pt 3D MMG. pap smear obtained today. Needs Mobic rx.  Pt will call with dosage.   Vivelle dot 0.0375mg  patches twice weekly to skin.  #24/4RF. nortriptyline rx to pharmacy. return annually or prn  An After Visit Summary was printed and given to the patient.

## 2013-06-28 ENCOUNTER — Telehealth: Payer: Self-pay | Admitting: Obstetrics & Gynecology

## 2013-06-28 MED ORDER — MELOXICAM 15 MG PO TABS: 15.0000 mg | ORAL_TABLET | Freq: Every day | ORAL | Status: DC

## 2013-06-28 NOTE — Telephone Encounter (Signed)
Patient calling to inform Dr.Miller that dosage for Mobic is 15mg . Per OV on 5/11 order to be refilled when patient calls in with dosage information. Left message to call Meeker at [phone]. Advise Mobic 15mg  with refills until next AEX sent over to pharmacy of choice.

## 2013-06-28 NOTE — Telephone Encounter (Signed)
Patient calling re: meloxicam 15 mg tab. She needs refills and just needed to call back with the dosage for Dr. Sabra Heck, per the patient.  [facility]  [address]

## 2013-06-28 NOTE — Telephone Encounter (Signed)
Spoke with patient. Advised prescription sent to pharmacy of choice. Patient agreeable and verbalizes understanding.  Routing to provider for final review. Patient agreeable to disposition. Will close encounter

## 2013-06-30 LAB — IPS PAP SMEAR ONLY

## 2013-08-03 LAB — HM DEXA SCAN

## 2013-12-19 ENCOUNTER — Encounter: Payer: Self-pay | Admitting: Obstetrics & Gynecology

## 2013-12-21 ENCOUNTER — Encounter: Payer: Self-pay | Admitting: Physician Assistant

## 2013-12-21 ENCOUNTER — Ambulatory Visit (INDEPENDENT_AMBULATORY_CARE_PROVIDER_SITE_OTHER): Payer: 59 | Admitting: Physician Assistant

## 2013-12-21 VITALS — BP 102/60 | HR 64 | Temp 97.7°F | Resp 16 | Ht 59.0 in | Wt 123.0 lb

## 2013-12-21 DIAGNOSIS — Z0001 Encounter for general adult medical examination with abnormal findings: Secondary | ICD-10-CM

## 2013-12-21 DIAGNOSIS — E785 Hyperlipidemia, unspecified: Secondary | ICD-10-CM

## 2013-12-21 DIAGNOSIS — R6889 Other general symptoms and signs: Secondary | ICD-10-CM

## 2013-12-21 DIAGNOSIS — Z23 Encounter for immunization: Secondary | ICD-10-CM

## 2013-12-21 LAB — CBC WITH DIFFERENTIAL/PLATELET
Basophils Absolute: 0 10*3/uL (ref 0.0–0.1)
Basophils Relative: 0 % (ref 0–1)
Eosinophils Absolute: 0.2 10*3/uL (ref 0.0–0.7)
Eosinophils Relative: 3 % (ref 0–5)
HCT: 40.5 % (ref 36.0–46.0)
Hemoglobin: 13.3 g/dL (ref 12.0–15.0)
Lymphocytes Relative: 32 % (ref 12–46)
Lymphs Abs: 2.2 10*3/uL (ref 0.7–4.0)
MCH: 32 pg (ref 26.0–34.0)
MCHC: 32.8 g/dL (ref 30.0–36.0)
MCV: 97.6 fL (ref 78.0–100.0)
Monocytes Absolute: 0.6 10*3/uL (ref 0.1–1.0)
Monocytes Relative: 8 % (ref 3–12)
Neutro Abs: 3.9 10*3/uL (ref 1.7–7.7)
Neutrophils Relative %: 57 % (ref 43–77)
Platelets: 250 10*3/uL (ref 150–400)
RBC: 4.15 MIL/uL (ref 3.87–5.11)
RDW: 13.1 % (ref 11.5–15.5)
WBC: 6.9 10*3/uL (ref 4.0–10.5)

## 2013-12-21 NOTE — Progress Notes (Signed)
Complete Physical  Assessment and Plan: On estrogen then get on basa GERD- increase Prilosec, diet discussed 3D MGM PreDM-Discussed general issues about diabetes pathophysiology and management., Educational material distributed., Suggested low cholesterol diet., Encouraged aerobic exercise., Discussed foot care., Reminded to get yearly retinal exam. Chol--continue medications, check lipids, decrease fatty foods, increase activity.   Health Maintenance   Discussed med's effects and SE's. Screening labs and tests as requested with regular follow-up as recommended.  HPI  63 y.o. female  presents for a complete physical.   Her blood pressure has been controlled at home, today their BP is BP: 102/60 mmHg She does workout. She denies chest pain, shortness of breath, dizziness.  She is on cholesterol medication and denies myalgias. Her cholesterol is at goal. The cholesterol last visit was:   Lab Results  Component Value Date   CHOL 218* 12/21/2012   HDL 99 12/21/2012   LDLCALC 108* 12/21/2012   TRIG 57 12/21/2012   CHOLHDL 2.2 12/21/2012    She has been working on diet and exercise for prediabetes, and denies paresthesia of the feet, polydipsia and polyuria. Last A1C in the office was:  Lab Results  Component Value Date   HGBA1C 5.7* 12/21/2012   Patient is on Vitamin D supplement.   Lab Results  Component Value Date   VD25OH 66 12/21/2012     She is on estradiol 1 patch 2 x week and is not on low dose ASA.  Has GERD, has had some trouble swallowing, she will increase to 2 a day of the prilosec. She take naproxen for her back and neck pain but states she can not go without that.  Current Medications:  Current Outpatient Prescriptions on File Prior to Visit  Medication Sig Dispense Refill  . Biotin 1 MG CAPS Take by mouth.    . Calcium Carbonate-Vitamin D (CALCIUM-VITAMIN D) 500-200 MG-UNIT per tablet Take 1 tablet by mouth daily. Calcium magnesium    . cholecalciferol (VITAMIN  D) 1000 UNITS tablet Take 3,000 Units by mouth daily.     Marland Kitchen estradiol (MINIVELLE) 0.0375 MG/24HR Place 1 patch onto the skin 2 (two) times a week. 24 patch 4  . FINACEA 15 % cream     . Flaxseed, Linseed, (FLAXSEED OIL PO) Take 1,000 mg by mouth daily.    Marland Kitchen glucosamine-chondroitin 500-400 MG tablet Take 1 tablet by mouth 3 (three) times daily.    . metroNIDAZOLE (METROGEL) 1 % gel Apply topically daily.    Marland Kitchen omeprazole (PRILOSEC) 40 MG capsule Take 40 mg by mouth daily.    Marland Kitchen pyridoxine (B-6) 500 MG tablet Take 500 mg by mouth daily.    . vitamin B-12 (CYANOCOBALAMIN) 1000 MCG tablet Take 1,000 mcg by mouth daily.    . vitamin C (ASCORBIC ACID) 500 MG tablet Take 500 mg by mouth daily.     No current facility-administered medications on file prior to visit.   Health Maintenance:   Immunization History  Administered Date(s) Administered  . DTaP 10/04/2010  . Influenza-Unspecified 11/29/2012, 11/29/2013  . Pneumococcal-Unspecified 12/04/2002  . Zoster 12/03/2005   Tetanus: 2012 Pneumovax: 2004 Flu vaccine:2015 Prevnar 13: Will get today Zostavax: 2007 [name]: TAH/ BSO 2003 Pap: 06/2013 Dr. Ammie Ferrier MGM: 01/2013 + dense breast will get 3D MGM DEXA: within 2 years with Dr. Sabra Heck, normal  Colonoscopy: March 2015 due in 5 years EGD: Last Dental Exam: Dr. Toy Cookey, q 6 months  Patient Care Team: Unk Pinto, MD as PCP - General (Internal Medicine) Juanda Crumble  Donato Heinz, OD as Referring Physician (Optometry)- Jan/Feb 2014, yearly Missy Sabins, MD as Consulting Physician (Gastroenterology) Meredith Pel, MD as Consulting Physician (Orthopedic Surgery) Berenice Primas, MD as Referring Physician (Orthopedic Surgery) Lyman Speller, MD as Consulting Physician (Gynecology)  Allergies:  Allergies  Allergen Reactions  . Other     Band-aid  . Codeine Hives  . Sulfa Antibiotics Nausea And Vomiting   Medical History:  Past Medical History  Diagnosis Date  . Interstitial  cystitis   . Eagle's syndrome   . Spinal stenosis   . Prediabetes   . Vitamin D deficiency   . Hyperlipidemia   . Hypertension     remote history diet controlled  . GERD (gastroesophageal reflux disease)   . Anemia   . Plantar fasciitis   . Carpal tunnel syndrome, bilateral   . Arthritis     in neck   Surgical History:  Past Surgical History  Procedure Laterality Date  . Cesarean section    . Myringotomy    . Tonsillectomy      LEFT only, due to Eagle Syndrome  . Lumbar laminectomy    . Carpal tunnel release      LEFT  . Trigger finger release      RIGHT  . Anal sphincterotomy    . Nasal sinus surgery    . Abdominal hysterectomy  2003    BSO  . Hemorrhoid surgery N/A 1998  . Cystoscopy    . Colonoscopy      internal hemorrhoids  . Cortisone injection      in neck  . Tonsillectomy  10/14    right side due to Eagles syndrome  . Tympanostomy tube placement     Family History:  Family History  Problem Relation Age of Onset  . Diabetes Mother   . Hypertension Mother   . Hyperlipidemia Mother   . Hypertension Brother   . Hyperlipidemia Brother   . Prostate cancer Father     Bone  . Osteoporosis Mother    Social History:  History  Substance Use Topics  . Smoking status: Former Smoker -- 0.50 packs/day for 15 years    Quit date: 12/04/1986  . Smokeless tobacco: Never Used  . Alcohol Use: 5.0 - 6.0 oz/week    10-12 drink(s) per week    Review of Systems: [X]  = complains of  [ ]  = denies  General: Fatigue [ ]  Fever [ ]  Chills [ ]  Weakness [ ]   Insomnia [ ] Weight change [ ]  Night sweats [ ]   Change in appetite [ ]  Head: Head Trauma [ ]  Eyes: Wears glasses or corrective lens [ ]  Redness [ ]  Blurred vision [ ]  Diplopia [ ]  Discharge [ ]  Floaters [ ]  XBM:[id number] [ ]  hearing loss [ ]  Tinnitus [ ]  Ear Discharge [ ]   Congestion [ ]  Sinus Pain [ ]  Post Nasal Drip [ ]  Nose Bleeds [ ]  Rhinorrhea [ ]    Difficulty Swallowing [ ]  Snoring [ ]  Sore Throat [ ]  Cardiac:    Chest pain/pressure [ ]  SOB [ ]  Orthopnea [ ]   Palpitations [ ]   Paroxysmal nocturnal dyspnea[ ]  Claudication [ ]  Edema [ ]  Difficulty walking around block or climbing stairs [ ]  Pulmonary: Cough [ ]  Wheezing[ ]   SOB [ ]   Pleurisy [ ]  Asthma [ ]  GI: Nausea [ ]  Vomiting[ ]  Dysphagia[ ]  Heartburn[ ]  Abdominal pain [ ]  Constipation [ ] ; Diarrhea [ ]  BRBPR [ ]  Melena[ ]  Bloating [ ]   Hemorrhoids [ ]  Incontinence [ ]  GU: Hematuria[ ]  Dysuria [ ]  Nocturia[ ]  Urgency [ ]   Hesitancy [ ]  Discharge [ ]  Frequency [ ]  Incontinence [ ]  Breast:  Dimpling [ ]  Breast lumps [ ]   Breast Lesions [ ]  Nipple discharge [ ]    Neuro: Headaches[ ]  Vertigo[ ]  Paresthesias[ ]  Spasm [ ]  Speech changes [ ]  Incoordination [ ]  Dizziness [ ]  Numbness [ ]  Ortho: Arthritis [ ]  Joint pain [ ]  Muscle pain [ ]  Joint swelling [ ]  Back Pain [ ]  Weakness [ ]  Stiffness [ ]  Skin:  Rash [ ]   Pruritis [ ]  Change in skin lesion [ ]  Change in hair [ ]  Change in nails [ ]  Psych: Depression[ ]  Anxiety[ ]  Stress [ ]  Confusion [ ]  Memory loss [ ]   Heme/Lymph: Bleeding [ ]  Bruising [ ]  History of anemia [ ]  Enlarged lymph nodes [ ]   Endocrine: Visual blurring [ ]  Paresthesia [ ]  Polyuria [ ]  Polydipsia [ ]  Polyphagia [ ]   Heat/cold intolerance [ ]  Hypoglycemia [ ]  Thyroid Issues [ ]  Diabetes [ ]   Physical Exam: Estimated body mass index is 24.83 kg/(m^2) as calculated from the following:   Height as of this encounter: 4\' 11"  (1.499 m).   Weight as of this encounter: 123 lb (55.792 kg). BP 102/60 mmHg  Pulse 64  Temp(Src) 97.7 F (36.5 C)  Resp 16  Ht 4\' 11"  (1.499 m)  Wt 123 lb (55.792 kg)  BMI 24.83 kg/m2  [name] 10/18/2001 General Appearance: Well nourished, in no apparent distress.  Eyes: PERRLA, EOMs, conjunctiva no swelling or erythema, normal fundi and vessels.  Sinuses: No Frontal/maxillary tenderness  ENT/Mouth: Ext aud canals clear, normal light reflex with TMs without erythema, bulging. Good dentition. No erythema, swelling, or  exudate on post pharynx. Tonsils not swollen or erythematous. Hearing normal.  Neck: Supple, thyroid normal. No bruits  Respiratory: Respiratory effort normal, BS equal bilaterally without rales, rhonchi, wheezing or stridor.  Cardio: RRR without murmurs, rubs or gallops. Brisk peripheral pulses without edema.  Chest: symmetric, with normal excursions and percussion.  Breasts: defer  Abdomen: Soft, nontender, no guarding, rebound, hernias, masses, or organomegaly. .  Lymphatics: Non tender without lymphadenopathy.  Genitourinary: defer Musculoskeletal: Full ROM all peripheral extremities,5/5 strength, and normal gait.  Skin: Warm, dry without rashes, lesions, ecchymosis. Neuro: Cranial nerves intact, reflexes equal bilaterally. Normal muscle tone, no cerebellar symptoms. Sensation intact.  Psych: Awake and oriented X 3, normal affect, Insight and Judgment appropriate.   EKG: WNL no changes   Vicie Mutters 10:27 AM [facility]

## 2013-12-21 NOTE — Patient Instructions (Addendum)
Since on estrogen, add low dose, 81mg , enteric coated aspirin with food when you take your patch.  Get 3D mammogram  Preventative Care for Adults - Female      MAINTAIN REGULAR HEALTH EXAMS:  A routine yearly physical is a good way to check in with your primary care provider about your health and preventive screening. It is also an opportunity to share updates about your health and any concerns you have, and receive a thorough all-over exam.   Most health insurance companies pay for at least some preventative services.  Check with your health plan for specific coverages.  WHAT PREVENTATIVE SERVICES DO WOMEN NEED?  Adult women should have their weight and blood pressure checked regularly.   Women age 105 and older should have their cholesterol levels checked regularly.  Women should be screened for cervical cancer with a Pap smear and pelvic exam beginning at either age 68, or 3 years after they become sexually activity.    Breast cancer screening generally begins at age 65 with a mammogram and breast exam by your primary care provider.    Beginning at age 67 and continuing to age 38, women should be screened for colorectal cancer.  Certain people may need continued testing until age 9.  Updating vaccinations is part of preventative care.  Vaccinations help protect against diseases such as the flu.  Osteoporosis is a disease in which the bones lose minerals and strength as we age. Women ages 108 and over should discuss this with their caregivers, as should women after menopause who have other risk factors.  Lab tests are generally done as part of preventative care to screen for anemia and blood disorders, to screen for problems with the kidneys and liver, to screen for bladder problems, to check blood sugar, and to check your cholesterol level.  Preventative services generally include counseling about diet, exercise, avoiding tobacco, drugs, excessive alcohol consumption, and sexually  transmitted infections.    GENERAL RECOMMENDATIONS FOR GOOD HEALTH:  Healthy diet:  Eat a variety of foods, including fruit, vegetables, animal or vegetable protein, such as meat, fish, chicken, and eggs, or beans, lentils, tofu, and grains, such as rice.  Drink plenty of water daily.  Decrease saturated fat in the diet, avoid lots of red meat, processed foods, sweets, fast foods, and fried foods.  Exercise:  Aerobic exercise helps maintain good heart health. At least 30-40 minutes of moderate-intensity exercise is recommended. For example, a brisk walk that increases your heart rate and breathing. This should be done on most days of the week.   Find a type of exercise or a variety of exercises that you enjoy so that it becomes a part of your daily life.  Examples are running, walking, swimming, water aerobics, and biking.  For motivation and support, explore group exercise such as aerobic class, spin class, Zumba, Yoga,or  martial arts, etc.    Set exercise goals for yourself, such as a certain weight goal, walk or run in a race such as a 5k walk/run.  Speak to your primary care provider about exercise goals.  Disease prevention:  If you smoke or chew tobacco, find out from your caregiver how to quit. It can literally save your life, no matter how long you have been a tobacco user. If you do not use tobacco, never begin.   Maintain a healthy diet and normal weight. Increased weight leads to problems with blood pressure and diabetes.   The Body Mass Index or BMI is  a way of measuring how much of your body is fat. Having a BMI above 27 increases the risk of heart disease, diabetes, hypertension, stroke and other problems related to obesity. Your caregiver can help determine your BMI and based on it develop an exercise and dietary program to help you achieve or maintain this important measurement at a healthful level.  High blood pressure causes heart and blood vessel problems.  Persistent  high blood pressure should be treated with medicine if weight loss and exercise do not work.   Fat and cholesterol leaves deposits in your arteries that can block them. This causes heart disease and vessel disease elsewhere in your body.  If your cholesterol is found to be high, or if you have heart disease or certain other medical conditions, then you may need to have your cholesterol monitored frequently and be treated with medication.   Ask if you should have a cardiac stress test if your history suggests this. A stress test is a test done on a treadmill that looks for heart disease. This test can find disease prior to there being a problem.  Menopause can be associated with physical symptoms and risks. Hormone replacement therapy is available to decrease these. You should talk to your caregiver about whether starting or continuing to take hormones is right for you.   Osteoporosis is a disease in which the bones lose minerals and strength as we age. This can result in serious bone fractures. Risk of osteoporosis can be identified using a bone density scan. Women ages 62 and over should discuss this with their caregivers, as should women after menopause who have other risk factors. Ask your caregiver whether you should be taking a calcium supplement and Vitamin D, to reduce the rate of osteoporosis.   Avoid drinking alcohol in excess (more than two drinks per day).  Avoid use of street drugs. Do not share needles with anyone. Ask for professional help if you need assistance or instructions on stopping the use of alcohol, cigarettes, and/or drugs.  Brush your teeth twice a day with fluoride toothpaste, and floss once a day. Good oral hygiene prevents tooth decay and gum disease. The problems can be painful, unattractive, and can cause other health problems. Visit your dentist for a routine oral and dental check up and preventive care every 6-12 months.   Look at your skin regularly.  Use a mirror to  look at your back. Notify your caregivers of changes in moles, especially if there are changes in shapes, colors, a size larger than a pencil eraser, an irregular border, or development of new moles.  Safety:  Use seatbelts 100% of the time, whether driving or as a passenger.  Use safety devices such as hearing protection if you work in environments with loud noise or significant background noise.  Use safety glasses when doing any work that could send debris in to the eyes.  Use a helmet if you ride a bike or motorcycle.  Use appropriate safety gear for contact sports.  Talk to your caregiver about gun safety.  Use sunscreen with a SPF (or skin protection factor) of 15 or greater.  Lighter skinned people are at a greater risk of skin cancer. Don't forget to also wear sunglasses in order to protect your eyes from too much damaging sunlight. Damaging sunlight can accelerate cataract formation.   Practice safe sex. Use condoms. Condoms are used for birth control and to help reduce the spread of sexually transmitted infections (or STIs).  Some of the STIs are gonorrhea (the clap), chlamydia, syphilis, trichomonas, herpes, HPV (human papilloma virus) and HIV (human immunodeficiency virus) which causes AIDS. The herpes, HIV and HPV are viral illnesses that have no cure. These can result in disability, cancer and death.   Keep carbon monoxide and smoke detectors in your home functioning at all times. Change the batteries every 6 months or use a model that plugs into the wall.   Vaccinations:  Stay up to date with your tetanus shots and other required immunizations. You should have a booster for tetanus every 10 years. Be sure to get your flu shot every year, since 5%-20% of the [place] population comes down with the flu. The flu vaccine changes each year, so being vaccinated once is not enough. Get your shot in the fall, before the flu season peaks.   Other vaccines to consider:  Human Papilloma Virus or HPV  causes cancer of the cervix, and other infections that can be transmitted from person to person. There is a vaccine for HPV, and females should get immunized between the ages of 89 and 69. It requires a series of 3 shots.   Pneumococcal vaccine to protect against certain types of pneumonia.  This is normally recommended for adults age 54 or older.  However, adults younger than 62 years old with certain underlying conditions such as diabetes, heart or lung disease should also receive the vaccine.  Shingles vaccine to protect against Varicella Zoster if you are older than age 5, or younger than 62 years old with certain underlying illness.  Hepatitis A vaccine to protect against a form of infection of the liver by a virus acquired from food.  Hepatitis B vaccine to protect against a form of infection of the liver by a virus acquired from blood or body fluids, particularly if you work in health care.  If you plan to travel internationally, check with your local health department for specific vaccination recommendations.  Cancer Screening:  Breast cancer screening is essential to preventive care for women. All women age 41 and older should perform a breast self-exam every month. At age 70 and older, women should have their caregiver complete a breast exam each year. Women at ages 34 and older should have a mammogram (x-ray film) of the breasts. Your caregiver can discuss how often you need mammograms.    Cervical cancer screening includes taking a Pap smear (sample of cells examined under a microscope) from the cervix (end of the uterus). It also includes testing for HPV (Human Papilloma Virus, which can cause cervical cancer). Screening and a pelvic exam should begin at age 43, or 3 years after a woman becomes sexually active. Screening should occur every year, with a Pap smear but no HPV testing, up to age 72. After age 58, you should have a Pap smear every 3 years with HPV testing, if no HPV was  found previously.   Most routine colon cancer screening begins at the age of 51. On a yearly basis, doctors may provide special easy to use take-home tests to check for hidden blood in the stool. Sigmoidoscopy or colonoscopy can detect the earliest forms of colon cancer and is life saving. These tests use a small camera at the end of a tube to directly examine the colon. Speak to your caregiver about this at age 62, when routine screening begins (and is repeated every 5 years unless early forms of pre-cancerous polyps or small growths are found).

## 2013-12-22 ENCOUNTER — Encounter: Payer: Self-pay | Admitting: Internal Medicine

## 2013-12-22 LAB — IRON AND TIBC
%SAT: 36 % (ref 20–55)
Iron: 108 ug/dL (ref 42–145)
TIBC: 300 ug/dL (ref 250–470)
UIBC: 192 ug/dL (ref 125–400)

## 2013-12-22 LAB — URINALYSIS, ROUTINE W REFLEX MICROSCOPIC
Bilirubin Urine: NEGATIVE
Glucose, UA: NEGATIVE mg/dL
Hgb urine dipstick: NEGATIVE
Ketones, ur: NEGATIVE mg/dL
Nitrite: NEGATIVE
Protein, ur: NEGATIVE mg/dL
Specific Gravity, Urine: 1.006 (ref 1.005–1.030)
Urobilinogen, UA: 0.2 mg/dL (ref 0.0–1.0)
pH: 7.5 (ref 5.0–8.0)

## 2013-12-22 LAB — HEPATIC FUNCTION PANEL
ALT: 13 U/L (ref 0–35)
AST: 19 U/L (ref 0–37)
Albumin: 4.1 g/dL (ref 3.5–5.2)
Alkaline Phosphatase: 57 U/L (ref 39–117)
Bilirubin, Direct: 0.1 mg/dL (ref 0.0–0.3)
Indirect Bilirubin: 0.3 mg/dL (ref 0.2–1.2)
Total Bilirubin: 0.4 mg/dL (ref 0.2–1.2)
Total Protein: 6.7 g/dL (ref 6.0–8.3)

## 2013-12-22 LAB — MICROALBUMIN / CREATININE URINE RATIO
Creatinine, Urine: 14.4 mg/dL
Microalb, Ur: <0.2 mg/dL (ref <2.0)

## 2013-12-22 LAB — LIPID PANEL
Cholesterol: 206 mg/dL — ABNORMAL HIGH (ref 0–200)
HDL: 91 mg/dL (ref >39)
LDL Cholesterol: 95 mg/dL (ref 0–99)
Total CHOL/HDL Ratio: 2.3 Ratio
Triglycerides: 99 mg/dL (ref <150)
VLDL: 20 mg/dL (ref 0–40)

## 2013-12-22 LAB — HEMOGLOBIN A1C
Hgb A1c MFr Bld: 5.6 % (ref <5.7)
Mean Plasma Glucose: 114 mg/dL (ref <117)

## 2013-12-22 LAB — VITAMIN D 25 HYDROXY (VIT D DEFICIENCY, FRACTURES): Vit D, 25-Hydroxy: 81 ng/mL (ref 30–89)

## 2013-12-22 LAB — BASIC METABOLIC PANEL WITH GFR
BUN: 15 mg/dL (ref 6–23)
CO2: 26 mEq/L (ref 19–32)
Calcium: 9.2 mg/dL (ref 8.4–10.5)
Chloride: 105 mEq/L (ref 96–112)
Creat: 0.61 mg/dL (ref 0.50–1.10)
GFR, Est African American: >89 mL/min
GFR, Est Non African American: >89 mL/min
Glucose, Bld: 86 mg/dL (ref 70–99)
Potassium: 4.5 mEq/L (ref 3.5–5.3)
Sodium: 140 mEq/L (ref 135–145)

## 2013-12-22 LAB — URINALYSIS, MICROSCOPIC ONLY
Casts: NONE SEEN
Crystals: NONE SEEN
Squamous Epithelial / LPF: NONE SEEN

## 2013-12-22 LAB — MAGNESIUM: Magnesium: 1.9 mg/dL (ref 1.5–2.5)

## 2013-12-22 LAB — INSULIN, FASTING: Insulin fasting, serum: 2.7 u[IU]/mL (ref 2.0–19.6)

## 2013-12-22 LAB — VITAMIN B12: Vitamin B-12: 1014 pg/mL — ABNORMAL HIGH (ref 211–911)

## 2013-12-22 LAB — FERRITIN: Ferritin: 55 ng/mL (ref 10–291)

## 2013-12-22 LAB — TSH: TSH: 1.725 u[IU]/mL (ref 0.350–4.500)

## 2013-12-22 MED ORDER — CIPROFLOXACIN HCL 500 MG PO TABS: 500.0000 mg | ORAL_TABLET | Freq: Two times a day (BID) | ORAL | Status: AC

## 2014-04-13 ENCOUNTER — Ambulatory Visit (INDEPENDENT_AMBULATORY_CARE_PROVIDER_SITE_OTHER): Payer: 59 | Admitting: Emergency Medicine

## 2014-04-13 VITALS — BP 118/75 | HR 84 | Temp 98.7°F | Resp 18 | Wt 124.0 lb

## 2014-04-13 DIAGNOSIS — J209 Acute bronchitis, unspecified: Secondary | ICD-10-CM

## 2014-04-13 DIAGNOSIS — J0140 Acute pansinusitis, unspecified: Secondary | ICD-10-CM

## 2014-04-13 MED ORDER — AMOXICILLIN-POT CLAVULANATE 875-125 MG PO TABS: 1.0000 | ORAL_TABLET | Freq: Two times a day (BID) | ORAL | Status: DC

## 2014-04-13 NOTE — Progress Notes (Signed)
[facility] [address], [place] [address] [phone]  Date:  04/13/2014   Name:  Helen Glover   DOB:  05/11/1950   MRN:  [id number]  PCP:  Alesia Richards, MD    Chief Complaint: URI and Toe Pain   History of Present Illness:  Helen Glover is a 64 y.o. very pleasant female patient who presents with the following:  Ill for a week with nasal congestion and purulent drainage Has a cough with purulent sputum.  No wheezing or shortness of breath. No fever or chills  No nausea or vomiting. No stool change Malaise and myalgias Has a wart on her left second toe. No improvement with over the counter medications or other home remedies.  Denies other complaint or health concern today.   Patient Active Problem List   Diagnosis Date Noted  . Hyperlipidemia   . Hypertension   . Prediabetes   . Vitamin D deficiency   . Spinal stenosis   . GERD (gastroesophageal reflux disease) 11/17/2011  . Interstitial cystitis     Past Medical History  Diagnosis Date  . Interstitial cystitis   . Eagle's syndrome   . Spinal stenosis   . Prediabetes   . Vitamin D deficiency   . Hyperlipidemia   . Hypertension     remote history diet controlled  . GERD (gastroesophageal reflux disease)   . Anemia   . Plantar fasciitis   . Carpal tunnel syndrome, bilateral   . Arthritis     in neck    Past Surgical History  Procedure Laterality Date  . Cesarean section    . Myringotomy    . Tonsillectomy      LEFT only, due to Eagle Syndrome  . Lumbar laminectomy    . Carpal tunnel release      LEFT  . Trigger finger release      RIGHT  . Anal sphincterotomy    . Nasal sinus surgery    . Abdominal hysterectomy  2003    BSO  . Hemorrhoid surgery N/A 1998  . Cystoscopy    . Colonoscopy      internal hemorrhoids  . Cortisone injection      in neck  . Tonsillectomy  10/14    right side due to Eagles syndrome  . Tympanostomy tube placement      History   Substance Use Topics  . Smoking status: Former Smoker -- 0.50 packs/day for 15 years    Quit date: 12/04/1986  . Smokeless tobacco: Never Used  . Alcohol Use: 5.0 - 6.0 oz/week    10-12 drink(s) per week    Family History  Problem Relation Age of Onset  . Diabetes Mother   . Hypertension Mother   . Hyperlipidemia Mother   . Hypertension Brother   . Hyperlipidemia Brother   . Prostate cancer Father     Bone  . Osteoporosis Mother     Allergies  Allergen Reactions  . Other     Band-aid  . Codeine Hives  . Sulfa Antibiotics Nausea And Vomiting    Medication list has been reviewed and updated.  Current Outpatient Prescriptions on File Prior to Visit  Medication Sig Dispense Refill  . Biotin 1 MG CAPS Take by mouth.    . Calcium Carbonate-Vitamin D (CALCIUM-VITAMIN D) 500-200 MG-UNIT per tablet Take 1 tablet by mouth daily. Calcium magnesium    . cholecalciferol (VITAMIN D) 1000 UNITS tablet Take 3,000 Units by mouth daily.     Marland Kitchen  estradiol (MINIVELLE) 0.0375 MG/24HR Place 1 patch onto the skin 2 (two) times a week. 24 patch 4  . FINACEA 15 % cream     . Flaxseed, Linseed, (FLAXSEED OIL PO) Take 1,000 mg by mouth daily.    Marland Kitchen glucosamine-chondroitin 500-400 MG tablet Take 1 tablet by mouth 3 (three) times daily.    . metroNIDAZOLE (METROGEL) 1 % gel Apply topically daily.    . naproxen (NAPROSYN) 250 MG tablet Take 250 mg by mouth. Every 6-8 hours as needed    . omeprazole (PRILOSEC) 40 MG capsule Take 40 mg by mouth daily.    Marland Kitchen pyridoxine (B-6) 500 MG tablet Take 500 mg by mouth daily.    . vitamin B-12 (CYANOCOBALAMIN) 1000 MCG tablet Take 1,000 mcg by mouth daily.    . vitamin C (ASCORBIC ACID) 500 MG tablet Take 500 mg by mouth daily.     No current facility-administered medications on file prior to visit.    Review of Systems:  As per HPI, otherwise negative.    Physical Examination: Filed Vitals:   04/13/14 1120  BP: 118/75  Pulse: 84  Temp: 98.7 F (37.1  C)  Resp: 18   Filed Vitals:   04/13/14 1120  Weight: 124 lb (56.246 kg)   Body mass index is 25.03 kg/(m^2). Ideal Body Weight:    GEN: WDWN, NAD, Non-toxic, A & O x 3 HEENT: Atraumatic, Normocephalic. Neck supple. No masses, No LAD. Ears and Nose: No external deformity. CV: RRR, No M/G/R. No JVD. No thrill. No extra heart sounds. PULM: CTA B, no wheezes, crackles, rhonchi. No retractions. No resp. distress. No accessory muscle use. ABD: S, NT, ND, +BS. No rebound. No HSM. EXTR: No c/c/e NEURO Normal gait.  PSYCH: Normally interactive. Conversant. Not depressed or anxious appearing.  Calm demeanor.    Assessment and Plan: Wart on toe Dermatologist Sinusitis Bronchitis augmentin  Signed,  Ellison Carwin, MD

## 2014-04-13 NOTE — Patient Instructions (Signed)
Acute Bronchitis Bronchitis is inflammation of the airways that extend from the windpipe into the lungs (bronchi). The inflammation often causes mucus to develop. This leads to a cough, which is the most common symptom of bronchitis.  In acute bronchitis, the condition usually develops suddenly and goes away over time, usually in a couple weeks. Smoking, allergies, and asthma can make bronchitis worse. Repeated episodes of bronchitis may cause further lung problems.  CAUSES Acute bronchitis is most often caused by the same virus that causes a cold. The virus can spread from person to person (contagious) through coughing, sneezing, and touching contaminated objects. SIGNS AND SYMPTOMS   Cough.   Fever.   Coughing up mucus.   Body aches.   Chest congestion.   Chills.   Shortness of breath.   Sore throat.  DIAGNOSIS  Acute bronchitis is usually diagnosed through a physical exam. Your health care provider will also ask you questions about your medical history. Tests, such as chest X-rays, are sometimes done to rule out other conditions.  TREATMENT  Acute bronchitis usually goes away in a couple weeks. Oftentimes, no medical treatment is necessary. Medicines are sometimes given for relief of fever or cough. Antibiotic medicines are usually not needed but may be prescribed in certain situations. In some cases, an inhaler may be recommended to help reduce shortness of breath and control the cough. A cool mist vaporizer may also be used to help thin bronchial secretions and make it easier to clear the chest.  HOME CARE INSTRUCTIONS  Get plenty of rest.   Drink enough fluids to keep your urine clear or pale yellow (unless you have a medical condition that requires fluid restriction). Increasing fluids may help thin your respiratory secretions (sputum) and reduce chest congestion, and it will prevent dehydration.   Take medicines only as directed by your health care provider.  If  you were prescribed an antibiotic medicine, finish it all even if you start to feel better.  Avoid smoking and secondhand smoke. Exposure to cigarette smoke or irritating chemicals will make bronchitis worse. If you are a smoker, consider using nicotine gum or skin patches to help control withdrawal symptoms. Quitting smoking will help your lungs heal faster.   Reduce the chances of another bout of acute bronchitis by washing your hands frequently, avoiding people with cold symptoms, and trying not to touch your hands to your mouth, nose, or eyes.   Keep all follow-up visits as directed by your health care provider.  SEEK MEDICAL CARE IF: Your symptoms do not improve after 1 week of treatment.  SEEK IMMEDIATE MEDICAL CARE IF:  You develop an increased fever or chills.   You have chest pain.   You have severe shortness of breath.  You have bloody sputum.   You develop dehydration.  You faint or repeatedly feel like you are going to pass out.  You develop repeated vomiting.  You develop a severe headache. MAKE SURE YOU:   Understand these instructions.  Will watch your condition.  Will get help right away if you are not doing well or get worse. Document Released: 03/13/2004 Document Revised: 06/20/2013 Document Reviewed: 07/27/2012 ExitCare Patient Information 2015 [employer]. This information is not intended to replace advice given to you by your health care provider. Make sure you discuss any questions you have with your health care provider. Sinusitis Sinusitis is redness, soreness, and inflammation of the paranasal sinuses. Paranasal sinuses are air pockets within the bones of your face (beneath the   eyes, the middle of the forehead, or above the eyes). In healthy paranasal sinuses, mucus is able to drain out, and air is able to circulate through them by way of your nose. However, when your paranasal sinuses are inflamed, mucus and air can become trapped. This can  allow bacteria and other germs to grow and cause infection. Sinusitis can develop quickly and last only a short time (acute) or continue over a long period (chronic). Sinusitis that lasts for more than 12 weeks is considered chronic.  CAUSES  Causes of sinusitis include:  Allergies.  Structural abnormalities, such as displacement of the cartilage that separates your nostrils (deviated septum), which can decrease the air flow through your nose and sinuses and affect sinus drainage.  Functional abnormalities, such as when the small hairs (cilia) that line your sinuses and help remove mucus do not work properly or are not present. SIGNS AND SYMPTOMS  Symptoms of acute and chronic sinusitis are the same. The primary symptoms are pain and pressure around the affected sinuses. Other symptoms include:  Upper toothache.  Earache.  Headache.  Bad breath.  Decreased sense of smell and taste.  A cough, which worsens when you are lying flat.  Fatigue.  Fever.  Thick drainage from your nose, which often is green and may contain pus (purulent).  Swelling and warmth over the affected sinuses. DIAGNOSIS  Your health care provider will perform a physical exam. During the exam, your health care provider may:  Look in your nose for signs of abnormal growths in your nostrils (nasal polyps).  Tap over the affected sinus to check for signs of infection.  View the inside of your sinuses (endoscopy) using an imaging device that has a light attached (endoscope). If your health care provider suspects that you have chronic sinusitis, one or more of the following tests may be recommended:  Allergy tests.  Nasal culture. A sample of mucus is taken from your nose, sent to a lab, and screened for bacteria.  Nasal cytology. A sample of mucus is taken from your nose and examined by your health care provider to determine if your sinusitis is related to an allergy. TREATMENT  Most cases of acute  sinusitis are related to a viral infection and will resolve on their own within 10 days. Sometimes medicines are prescribed to help relieve symptoms (pain medicine, decongestants, nasal steroid sprays, or saline sprays).  However, for sinusitis related to a bacterial infection, your health care provider will prescribe antibiotic medicines. These are medicines that will help kill the bacteria causing the infection.  Rarely, sinusitis is caused by a fungal infection. In theses cases, your health care provider will prescribe antifungal medicine. For some cases of chronic sinusitis, surgery is needed. Generally, these are cases in which sinusitis recurs more than 3 times per year, despite other treatments. HOME CARE INSTRUCTIONS   Drink plenty of water. Water helps thin the mucus so your sinuses can drain more easily.  Use a humidifier.  Inhale steam 3 to 4 times a day (for example, sit in the bathroom with the shower running).  Apply a warm, moist washcloth to your face 3 to 4 times a day, or as directed by your health care provider.  Use saline nasal sprays to help moisten and clean your sinuses.  Take medicines only as directed by your health care provider.  If you were prescribed either an antibiotic or antifungal medicine, finish it all even if you start to feel better. SEEK IMMEDIATE MEDICAL   CARE IF:  You have increasing pain or severe headaches.  You have nausea, vomiting, or drowsiness.  You have swelling around your face.  You have vision problems.  You have a stiff neck.  You have difficulty breathing. MAKE SURE YOU:   Understand these instructions.  Will watch your condition.  Will get help right away if you are not doing well or get worse. Document Released: 02/03/2005 Document Revised: 06/20/2013 Document Reviewed: 02/18/2011 ExitCare Patient Information 2015 [employer]. This information is not intended to replace advice given to you by your health care provider.  Make sure you discuss any questions you have with your health care provider.  

## 2014-07-14 ENCOUNTER — Ambulatory Visit (INDEPENDENT_AMBULATORY_CARE_PROVIDER_SITE_OTHER): Payer: 59 | Admitting: Obstetrics & Gynecology

## 2014-07-14 ENCOUNTER — Encounter: Payer: Self-pay | Admitting: Obstetrics & Gynecology

## 2014-07-14 VITALS — BP 136/80 | HR 80 | Resp 16 | Ht 58.5 in | Wt 119.0 lb

## 2014-07-14 DIAGNOSIS — Z01419 Encounter for gynecological examination (general) (routine) without abnormal findings: Secondary | ICD-10-CM | POA: Diagnosis not present

## 2014-07-14 MED ORDER — ESTRADIOL 0.0375 MG/24HR TD PTTW: 1.0000 | MEDICATED_PATCH | TRANSDERMAL | Status: DC

## 2014-07-14 MED ORDER — NORTRIPTYLINE HCL 10 MG/5ML PO SOLN: 10.0000 mg | Freq: Every day | ORAL | Status: DC

## 2014-07-14 NOTE — Progress Notes (Signed)
Patient ID: Helen Glover, female   DOB: 02/08/1951, 64 y.o.   MRN: [id number]   64 y.o. [id number] MarriedCaucasianF here for annual exam.  Going to have back surgery.  Having a myelogram next week.  Pt reports she is having a lot of sciatica.  Going to [place], Dr. Tonita Cong.  He was asking about her BMD testing.  MRI showed small renal cyst.  Has urology follow up.  No vaginal bleeding.  Patient's last menstrual period was 10/18/2001.          Sexually active: No.  The current method of family planning is tubal ligation.    Exercising: No.  Patient is going to be having back surgery soon and hopes to get back to exercising afterwards Smoker:  Former smoker  Health Maintenance: Pap:  5-1--15 WNL, neg HR HPV testing 2013 History of abnormal Pap:  Yes had BX done and was cleared  MMG:  02-02-14 BI Rads 1 WNL  Colonoscopy:  04/21/2013 repeat in 10 years [facility] Dr Amedeo Plenty  BMD:   01-27-12, nl/-1.5 TDaP:  09-24-10 Screening Labs: PCP  Hb today: PCP , Urine today: PCP   reports that she quit smoking about 27 years ago. She has never used smokeless tobacco. She reports that she drinks about 3.6 - 4.2 oz of alcohol per week. She reports that she does not use illicit drugs.  Past Medical History  Diagnosis Date  . Interstitial cystitis   . Eagle's syndrome   . Spinal stenosis   . Prediabetes   . Vitamin D deficiency   . Hyperlipidemia   . Hypertension     remote history diet controlled  . GERD (gastroesophageal reflux disease)   . Anemia   . Plantar fasciitis   . Carpal tunnel syndrome, bilateral   . Arthritis     in neck    Past Surgical History  Procedure Laterality Date  . Cesarean section    . Myringotomy    . Tonsillectomy      LEFT only, due to Eagle Syndrome  . Lumbar laminectomy    . Carpal tunnel release      LEFT  . Trigger finger release      RIGHT  . Anal sphincterotomy    . Nasal sinus surgery    . Abdominal hysterectomy  2003    BSO  . Hemorrhoid surgery N/A 1998   . Cystoscopy    . Colonoscopy      internal hemorrhoids  . Cortisone injection      in neck  . Tonsillectomy  10/14    right side due to Eagles syndrome  . Tympanostomy tube placement      Current Outpatient Prescriptions  Medication Sig Dispense Refill  . Biotin 1 MG CAPS Take by mouth.    . Calcium Carbonate-Vitamin D (CALCIUM-VITAMIN D) 500-200 MG-UNIT per tablet Take 1 tablet by mouth daily. Calcium magnesium    . cholecalciferol (VITAMIN D) 1000 UNITS tablet Take 3,000 Units by mouth daily.     Marland Kitchen estradiol (MINIVELLE) 0.0375 MG/24HR Place 1 patch onto the skin 2 (two) times a week. 24 patch 4  . FINACEA 15 % cream     . Flaxseed, Linseed, (FLAXSEED OIL PO) Take 1,000 mg by mouth daily.    Marland Kitchen glucosamine-chondroitin 500-400 MG tablet Take 1 tablet by mouth 3 (three) times daily.    Marland Kitchen HYDROcodone-acetaminophen (NORCO) 7.5-325 MG per tablet   0  . metroNIDAZOLE (METROGEL) 1 % gel Apply topically daily.    Marland Kitchen  nortriptyline (PAMELOR) 10 MG/5ML solution Take 10 mg by mouth at bedtime.    Marland Kitchen omeprazole (PRILOSEC) 40 MG capsule Take 40 mg by mouth daily.    Marland Kitchen pyridoxine (B-6) 500 MG tablet Take 500 mg by mouth daily.    . vitamin B-12 (CYANOCOBALAMIN) 1000 MCG tablet Take 1,000 mcg by mouth daily.    . vitamin C (ASCORBIC ACID) 500 MG tablet Take 500 mg by mouth daily.    . naproxen (NAPROSYN) 250 MG tablet Take 250 mg by mouth. Every 6-8 hours as needed     No current facility-administered medications for this visit.    Family History  Problem Relation Age of Onset  . Diabetes Mother   . Hypertension Mother   . Hyperlipidemia Mother   . Hypertension Brother   . Hyperlipidemia Brother   . Prostate cancer Father     Bone  . Osteoporosis Mother     ROS:  Pertinent items are noted in HPI.  Otherwise, a comprehensive ROS was negative.  Exam:   BP 136/80 mmHg  Pulse 80  Resp 16  Ht 4' 10.5" (1.486 m)  Wt 119 lb (53.978 kg)  BMI 24.44 kg/m2  [name] 10/18/2001  Weight change:     Height: 4' 10.5" (148.6 cm)  Ht Readings from Last 3 Encounters:  07/14/14 4' 10.5" (1.486 m)  12/21/13 4\' 11"  (1.499 m)  06/27/13 4' 10.5" (1.486 m)    General appearance: alert, cooperative and appears stated age Head: Normocephalic, without obvious abnormality, atraumatic Neck: no adenopathy, supple, symmetrical, trachea midline and thyroid normal to inspection and palpation Lungs: clear to auscultation bilaterally Breasts: normal appearance, no masses or tenderness Heart: regular rate and rhythm Abdomen: soft, non-tender; bowel sounds normal; no masses,  no organomegaly Extremities: extremities normal, atraumatic, no cyanosis or edema Skin: Skin color, texture, turgor normal. No rashes or lesions Lymph nodes: Cervical, supraclavicular, and axillary nodes normal. No abnormal inguinal nodes palpated Neurologic: Grossly normal   Pelvic: External genitalia:  no lesions              Urethra:  normal appearing urethra with no masses, tenderness or lesions              Bartholins and Skenes: normal                 Vagina: normal appearing vagina with normal color and discharge, no lesions              Cervix: absent              Pap taken: No. Bimanual Exam:  Uterus:  uterus absent              Adnexa: no mass, fullness, tenderness               Rectovaginal: Confirms               Anus:  normal sphincter tone, no lesions  Chaperone was present for exam.  A:  Well Woman with normal exam PMP, on HRT H/O TAH/BSO on estrogen alone Grade C breast density Spinal stenosis in cervical and lumbar region of spine Mild osteopenia I.C.   P: Mammogram yearly. D/W pt 3D MMG. pap smear obtained 2015.  No pap today.   Minivelle dot 0.0375mg  patches twice weekly to skin. #24/4RF. nortriptyline rx to pharmacy.  10mg /53ml.  Pt typically uses 1/4 tsp (~2.5mg ) nightly but does increase when needed.  Pharmacy typically dispenses 43ml (about 60 doses return annually  or prn

## 2014-07-19 ENCOUNTER — Other Ambulatory Visit: Payer: Self-pay | Admitting: Specialist

## 2014-07-19 DIAGNOSIS — M48061 Spinal stenosis, lumbar region without neurogenic claudication: Secondary | ICD-10-CM

## 2014-07-21 ENCOUNTER — Other Ambulatory Visit: Payer: Self-pay | Admitting: Specialist

## 2014-07-21 ENCOUNTER — Inpatient Hospital Stay
Admission: RE | Admit: 2014-07-21 | Discharge: 2014-07-21 | Disposition: A | Discharge status: Home / Self Care | Payer: Self-pay | Source: Ambulatory Visit | Attending: Specialist | Admitting: Specialist

## 2014-07-21 ENCOUNTER — Ambulatory Visit
Admission: RE | Admit: 2014-07-21 | Discharge: 2014-07-21 | Disposition: A | Discharge status: Home / Self Care | Payer: Self-pay | Source: Ambulatory Visit | Attending: Specialist | Admitting: Specialist

## 2014-07-21 ENCOUNTER — Ambulatory Visit
Admission: RE | Admit: 2014-07-21 | Discharge: 2014-07-21 | Disposition: A | Discharge status: Home / Self Care | Payer: 59 | Source: Ambulatory Visit | Attending: Specialist | Admitting: Specialist

## 2014-07-21 DIAGNOSIS — M48061 Spinal stenosis, lumbar region without neurogenic claudication: Secondary | ICD-10-CM

## 2014-07-21 MED ORDER — MEPERIDINE HCL 100 MG/ML IJ SOLN
75.0000 mg | Freq: Once | INTRAMUSCULAR | Status: AC
  Administered 2014-07-21: 75 mg via INTRAMUSCULAR

## 2014-07-21 MED ORDER — DIAZEPAM 5 MG PO TABS
5.0000 mg | ORAL_TABLET | Freq: Once | ORAL | Status: AC
  Administered 2014-07-21: 5 mg via ORAL

## 2014-07-21 MED ORDER — IOHEXOL 180 MG/ML  SOLN
20.0000 mL | Freq: Once | INTRAMUSCULAR | Status: AC | PRN
  Administered 2014-07-21: 20 mL via INTRATHECAL

## 2014-07-21 MED ORDER — ONDANSETRON HCL 4 MG/2ML IJ SOLN: 4.0000 mg | Freq: Four times a day (QID) | INTRAMUSCULAR | Status: DC | PRN

## 2014-07-21 MED ORDER — ONDANSETRON HCL 4 MG/2ML IJ SOLN
4.0000 mg | Freq: Once | INTRAMUSCULAR | Status: AC
  Administered 2014-07-21: 4 mg via INTRAMUSCULAR

## 2014-07-21 NOTE — Discharge Instructions (Signed)
Myelogram Discharge Instructions  1. Go home and rest quietly for the next 24 hours.  It is important to lie flat for the next 24 hours.  Get up only to go to the restroom.  You may lie in the bed or on a couch on your back, your stomach, your left side or your right side.  You may have one pillow under your head.  You may have pillows between your knees while you are on your side or under your knees while you are on your back.  2. DO NOT drive today.  Recline the seat as far back as it will go, while still wearing your seat belt, on the way home.  3. You may get up to go to the bathroom as needed.  You may sit up for 10 minutes to eat.  You may resume your normal diet and medications unless otherwise indicated.  Drink lots of extra fluids today and tomorrow.  4. The incidence of headache, nausea, or vomiting is about 5% (one in 20 patients).  If you develop a headache, lie flat and drink plenty of fluids until the headache goes away.  Caffeinated beverages may be helpful.  If you develop severe nausea and vomiting or a headache that does not go away with flat bed rest, call [phone].  5. You may resume normal activities after your 24 hours of bed rest is over; however, do not exert yourself strongly or do any heavy lifting tomorrow. If when you get up you have a headache when standing, go back to bed and force fluids for another 24 hours.  6. Call your physician for a follow-up appointment.  The results of your myelogram will be sent directly to your physician by the following day.  7. If you have any questions or if complications develop after you arrive home, please call [phone].  Discharge instructions have been explained to the patient.  The patient, or the person responsible for the patient, fully understands these instructions.      May resume Nortriptyline on July 21, 2012, after 8:00 am.

## 2014-07-21 NOTE — Progress Notes (Signed)
Pt states she has been off for at least 2 days.  Discharge instructions explained to pt.

## 2014-07-24 ENCOUNTER — Ambulatory Visit: Payer: Self-pay | Admitting: Orthopedic Surgery

## 2014-07-31 ENCOUNTER — Ambulatory Visit: Payer: Self-pay | Admitting: Orthopedic Surgery

## 2014-08-02 ENCOUNTER — Other Ambulatory Visit (HOSPITAL_COMMUNITY): Payer: Self-pay | Admitting: *Deleted

## 2014-08-03 ENCOUNTER — Ambulatory Visit (HOSPITAL_COMMUNITY)
Admission: RE | Admit: 2014-08-03 | Discharge: 2014-08-03 | Disposition: A | Discharge status: Home / Self Care | Payer: 59 | Source: Ambulatory Visit | Attending: Orthopedic Surgery | Admitting: Orthopedic Surgery

## 2014-08-03 ENCOUNTER — Encounter (HOSPITAL_COMMUNITY)
Admission: RE | Admit: 2014-08-03 | Discharge: 2014-08-03 | Disposition: A | Discharge status: Home / Self Care | Payer: 59 | Source: Ambulatory Visit | Attending: Specialist | Admitting: Specialist

## 2014-08-03 ENCOUNTER — Encounter (HOSPITAL_COMMUNITY): Payer: Self-pay

## 2014-08-03 DIAGNOSIS — M47896 Other spondylosis, lumbar region: Secondary | ICD-10-CM | POA: Diagnosis present

## 2014-08-03 DIAGNOSIS — M5126 Other intervertebral disc displacement, lumbar region: Secondary | ICD-10-CM

## 2014-08-03 HISTORY — DX: Anxiety disorder, unspecified: F41.9

## 2014-08-03 HISTORY — DX: Cardiac murmur, unspecified: R01.1

## 2014-08-03 HISTORY — DX: Cyst of kidney, acquired: N28.1

## 2014-08-03 HISTORY — DX: Other specified disorders of bone density and structure, unspecified site: M85.80

## 2014-08-03 HISTORY — DX: Reserved for concepts with insufficient information to code with codable children: IMO0002

## 2014-08-03 HISTORY — DX: Paresthesia of skin: R20.2

## 2014-08-03 LAB — CBC
HCT: 38.6 % (ref 36.0–46.0)
Hemoglobin: 13.1 g/dL (ref 12.0–15.0)
MCH: 32 pg (ref 26.0–34.0)
MCHC: 33.9 g/dL (ref 30.0–36.0)
MCV: 94.1 fL (ref 78.0–100.0)
Platelets: 232 10*3/uL (ref 150–400)
RBC: 4.1 MIL/uL (ref 3.87–5.11)
RDW: 12.6 % (ref 11.5–15.5)
WBC: 6.3 10*3/uL (ref 4.0–10.5)

## 2014-08-03 LAB — BASIC METABOLIC PANEL
Anion gap: 7 (ref 5–15)
BUN: 18 mg/dL (ref 6–20)
CO2: 30 mmol/L (ref 22–32)
Calcium: 9.2 mg/dL (ref 8.9–10.3)
Chloride: 102 mmol/L (ref 101–111)
Creatinine, Ser: 0.66 mg/dL (ref 0.44–1.00)
GFR calc Af Amer: >60 mL/min (ref >60)
GFR calc non Af Amer: >60 mL/min (ref >60)
Glucose, Bld: 108 mg/dL — ABNORMAL HIGH (ref 65–99)
Potassium: 4.8 mmol/L (ref 3.5–5.1)
Sodium: 139 mmol/L (ref 135–145)

## 2014-08-03 LAB — SURGICAL PCR SCREEN
MRSA, PCR: NEGATIVE
Staphylococcus aureus: NEGATIVE

## 2014-08-03 NOTE — Patient Instructions (Signed)
YOUR PROCEDURE IS SCHEDULED ON :  08/09/14  REPORT TO [facility] MAIN ENTRANCE FOLLOW SIGNS TO SHORT STAY CENTER AT : 7:45 am  CALL THIS NUMBER IF YOU HAVE PROBLEMS THE MORNING OF SURGERY [phone]  REMEMBER:  DO NOT EAT FOOD OR DRINK LIQUIDS AFTER MIDNIGHT  TAKE THESE MEDICINES THE MORNING OF SURGERY:  YOU MAY NOT HAVE ANY METAL ON YOUR BODY INCLUDING HAIR PINS AND PIERCING'S. DO NOT WEAR JEWELRY, MAKEUP, LOTIONS, POWDERS OR PERFUMES. DO NOT WEAR NAIL POLISH. DO NOT SHAVE 48 HRS PRIOR TO SURGERY. MEN MAY SHAVE FACE AND NECK.  DO NOT [place]. [facility] IS NOT RESPONSIBLE FOR VALUABLES.  CONTACTS, DENTURES OR PARTIALS MAY NOT BE WORN TO SURGERY. LEAVE SUITCASE IN CAR. CAN BE BROUGHT TO ROOM AFTER SURGERY.  PATIENTS DISCHARGED THE DAY OF SURGERY WILL NOT BE ALLOWED TO DRIVE HOME.  PLEASE READ OVER THE FOLLOWING INSTRUCTION SHEETS _________________________________________________________________________________                                           - PREPARING FOR SURGERY  Before surgery, you can play an important role.  Because skin is not sterile, your skin needs to be as free of germs as possible.  You can reduce the number of germs on your skin by washing with CHG (chlorahexidine gluconate) soap before surgery.  CHG is an antiseptic cleaner which kills germs and bonds with the skin to continue killing germs even after washing. Please DO NOT use if you have an allergy to CHG or antibacterial soaps.  If your skin becomes reddened/irritated stop using the CHG and inform your nurse when you arrive at Short Stay. Do not shave (including legs and underarms) for at least 48 hours prior to the first CHG shower.  You may shave your face. Please follow these instructions carefully:   1.  Shower with CHG Soap the night before surgery and the  morning of Surgery.   2.  If you choose to wash your hair, wash your hair first as usual  with your  normal  Shampoo.   3.  After you shampoo, rinse your hair and body thoroughly to remove the  shampoo.                                         4.  Use CHG as you would any other liquid soap.  You can apply chg directly  to the skin and wash . Gently wash with scrungie or clean wascloth    5.  Apply the CHG Soap to your body ONLY FROM THE NECK DOWN.   Do not use on open                           Wound or open sores. Avoid contact with eyes, ears mouth and genitals (private parts).                        Genitals (private parts) with your normal soap.              6.  Wash thoroughly, paying special attention to the area where your surgery  will be performed.   7.  Thoroughly rinse your body with warm water from the neck down.   8.  DO NOT shower/wash with your normal soap after using and rinsing off  the CHG Soap .                9.  Pat yourself dry with a clean towel.             10.  Wear clean night clothes to bed after shower             11.  Place clean sheets on your bed the night of your first shower and do not  sleep with pets.  Day of Surgery : Do not apply any lotions/deodorants the morning of surgery.  Please wear clean clothes to the hospital/surgery center.  FAILURE TO FOLLOW THESE INSTRUCTIONS MAY RESULT IN THE CANCELLATION OF YOUR SURGERY    PATIENT SIGNATURE_________________________________  ______________________________________________________________________     Helen Glover  An incentive spirometer is a tool that can help keep your lungs clear and active. This tool measures how well you are filling your lungs with each breath. Taking long deep breaths may help reverse or decrease the chance of developing breathing (pulmonary) problems (especially infection) following:  A long period of time when you are unable to move or be active. BEFORE THE PROCEDURE   If the spirometer includes an indicator to show your best effort, your nurse or  respiratory therapist will set it to a desired goal.  If possible, sit up straight or lean slightly forward. Try not to slouch.  Hold the incentive spirometer in an upright position. INSTRUCTIONS FOR USE  1. Sit on the edge of your bed if possible, or sit up as far as you can in bed or on a chair. 2. Hold the incentive spirometer in an upright position. 3. Breathe out normally. 4. Place the mouthpiece in your mouth and seal your lips tightly around it. 5. Breathe in slowly and as deeply as possible, raising the piston or the ball toward the top of the column. 6. Hold your breath for 3-5 seconds or for as long as possible. Allow the piston or ball to fall to the bottom of the column. 7. Remove the mouthpiece from your mouth and breathe out normally. 8. Rest for a few seconds and repeat Steps 1 through 7 at least 10 times every 1-2 hours when you are awake. Take your time and take a few normal breaths between deep breaths. 9. The spirometer may include an indicator to show your best effort. Use the indicator as a goal to work toward during each repetition. 10. After each set of 10 deep breaths, practice coughing to be sure your lungs are clear. If you have an incision (the cut made at the time of surgery), support your incision when coughing by placing a pillow or rolled up towels firmly against it. Once you are able to get out of bed, walk around indoors and cough well. You may stop using the incentive spirometer when instructed by your caregiver.  RISKS AND COMPLICATIONS  Take your time so you do not get dizzy or light-headed.  If you are in pain, you may need to take or ask for pain medication before doing incentive spirometry. It is harder to take a deep breath if you are having pain. AFTER USE  Rest and breathe slowly and easily.  It can be helpful to keep track of a log of your progress. Your caregiver can provide you with a  simple table to help with this. If you are using the  spirometer at home, follow these instructions: [place] IF:   You are having difficultly using the spirometer.  You have trouble using the spirometer as often as instructed.  Your pain medication is not giving enough relief while using the spirometer.  You develop fever of 100.5 F (38.1 C) or higher. SEEK IMMEDIATE MEDICAL CARE IF:   You cough up bloody sputum that had not been present before.  You develop fever of 102 F (38.9 C) or greater.  You develop worsening pain at or near the incision site. MAKE SURE YOU:   Understand these instructions.  Will watch your condition.  Will get help right away if you are not doing well or get worse. Document Released: 06/16/2006 Document Revised: 04/28/2011 Document Reviewed: 08/17/2006 [facility] Patient Information 2014 [place], [place].   ________________________________________________________________________

## 2014-08-08 ENCOUNTER — Ambulatory Visit: Payer: Self-pay | Admitting: Orthopedic Surgery

## 2014-08-08 NOTE — H&P (Signed)
Helen Glover is an 64 y.o. female.   Chief Complaint: back and right leg pain HPI: Helen Glover follows up with her husband, still having severe pain. She is already scheduled for surgery. Her stenosis is fairly significant at 3-4, also done at 4-5 on the right. She has pain that radiates into the top of her foot in the L5 nerve root distribution. Past Medical History  Diagnosis Date  . Interstitial cystitis   . Eagle's syndrome   . Spinal stenosis   . GERD (gastroesophageal reflux disease)   . Plantar fasciitis   . Arthritis     in neck  . Wears hearing aid   . Complication of anesthesia     "sore throat"  . Heart murmur     as child  . Carpal tunnel syndrome, bilateral     rt wrist  . Tingling     rt foot  . HNP (herniated nucleus pulposus)   . Osteopenia   . Kidney cysts   . Anxiety     Past Surgical History  Procedure Laterality Date  . Cesarean section    . Myringotomy    . Lumbar laminectomy    . Carpal tunnel release      LEFT  . Trigger finger release      RIGHT  . Anal sphincterotomy    . Nasal sinus surgery    . Abdominal hysterectomy  2003    BSO  . Hemorrhoid surgery N/A 1998  . Cystoscopy    . Colonoscopy      internal hemorrhoids  . Cortisone injection      in neck  . Tympanostomy tube placement    . Tonsillectomy      LEFT only, due to Eagle Syndrome  . Tonsillectomy  10/14    right side due to Eagles syndrome    Family History  Problem Relation Age of Onset  . Diabetes Mother   . Hypertension Mother   . Hyperlipidemia Mother   . Hypertension Brother   . Hyperlipidemia Brother   . Prostate cancer Father     Bone  . Osteoporosis Mother    Social History:  reports that she quit smoking about 27 years ago. She has never used smokeless tobacco. She reports that she drinks about 3.6 - 4.2 oz of alcohol per week. She reports that she does not use illicit drugs.  Allergies:  Allergies  Allergen Reactions  . Other     Band-aid  .  Codeine Hives  . Sulfa Antibiotics Nausea And Vomiting     (Not in a hospital admission)  No results found for this or any previous visit (from the past 48 hour(s)). No results found.  Review of Systems  Constitutional: Negative.   HENT: Negative.   Eyes: Negative.   Respiratory: Negative.   Cardiovascular: Negative.   Gastrointestinal: Negative.   Genitourinary: Negative.   Musculoskeletal: Positive for back pain.  Skin: Negative.   Neurological: Positive for sensory change and focal weakness.  Psychiatric/Behavioral: Negative.     Last menstrual period 10/18/2001. Physical Exam  Constitutional: She is oriented to person, place, and time. She appears well-developed and well-nourished.  HENT:  Head: Normocephalic and atraumatic.  Eyes: Conjunctivae and EOM are normal. Pupils are equal, round, and reactive to light.  Neck: Normal range of motion. Neck supple.  Cardiovascular: Normal rate and regular rhythm.   Respiratory: Effort normal.  GI: Soft.  Musculoskeletal:  On exam, she is upright, in moderate distress. Mood and  affect is appropriate. She has a minimal discomfort with forward flexion and extension of the lumbar spine. Straight leg raises, buttock, thigh and calf pain exacerbated with dorsal augmentation maneuver. Trace EHL weakness is noted. On the right compared to the left, she has mild quad weakness on the right as well.  Neurological: She is alert and oriented to person, place, and time. She has normal reflexes.  Skin: Skin is warm and dry.    The CT myelogram demonstrates, does have spinal stenosis at 3-4 and 4-5, particularly in the lateral recess fairly significant. Mild foraminal narrowing at 5-1. She does have severe with near complete on her lateral extension and reduction in the dye column.  Assessment/Plan Spinal stenosis, history of lumbar decompression, progressive multilevel disc degeneration, minimal mechanical back pain.  We discussed options of  living with her symptoms versus a decompression, but she has failed therapy, activity modification, injection remains severely disabled, discussed decompression at 3-4 and 4-5. I had an extensive discussion of the risks and benefits of the lumbar decompression with the patient including bleeding, infection, damage to neurovascular structures, epidural fibrosis, CSF leak requiring repair. We also discussed increase in pain, adjacent segment disease, recurrent disc herniation, need for future surgery including repeat decompression and/or fusion. We also discussed risks of postoperative hematoma, paralysis, anesthetic complications including DVT, PE, death, cardiopulmonary dysfunction. In addition, the perioperative and postoperative courses were discussed in detail including the rehabilitative time and return to functional activity and work. I provided the patient with an illustrated handout and utilized the appropriate surgical models.  She is having the lesion in her kidney worked as well. Currently, has an appointment for that, surgery is scheduled for next week. Discussed time in the hospital and followup, her husband was present. We will have Dr. Jola Baptist review the myelogram again with Dr. Logan Bores. Feel her stenosis is more significant than interpreted by Dr. Jeralyn Ruths.  I had an extensive discussion of the risks and benefits of the lumbar decompression with the patient including bleeding, infection, damage to neurovascular structures, epidural fibrosis, CSF leak requiring repair. We also discussed increase in pain, adjacent segment disease, recurrent disc herniation, need for future surgery including repeat decompression and/or fusion. We also discussed risks of postoperative hematoma, paralysis, anesthetic complications including DVT, PE, death, cardiopulmonary dysfunction. In addition, the perioperative and postoperative courses were discussed in detail including the rehabilitative time and return to  functional activity and work. I provided the patient with an illustrated handout and utilized the appropriate surgical models.  Plan microlumbar decompression L3-4, possible redo L4-5  BISSELL, JACLYN M. PA-C for Dr.Beane 08/08/2014, 5:48 PM

## 2014-08-09 ENCOUNTER — Ambulatory Visit (HOSPITAL_COMMUNITY): Payer: 59 | Admitting: Anesthesiology

## 2014-08-09 ENCOUNTER — Ambulatory Visit (HOSPITAL_COMMUNITY): Payer: 59

## 2014-08-09 ENCOUNTER — Ambulatory Visit (HOSPITAL_COMMUNITY)
Admission: RE | Admit: 2014-08-09 | Discharge: 2014-08-11 | Disposition: A | Discharge status: Home / Self Care | Payer: 59 | Source: Ambulatory Visit | Attending: Specialist | Admitting: Specialist

## 2014-08-09 ENCOUNTER — Encounter (HOSPITAL_COMMUNITY): Payer: Self-pay | Admitting: *Deleted

## 2014-08-09 ENCOUNTER — Encounter (HOSPITAL_COMMUNITY)
Admission: RE | Disposition: A | Discharge status: Home / Self Care | Payer: Self-pay | Source: Ambulatory Visit | Attending: Specialist

## 2014-08-09 DIAGNOSIS — I10 Essential (primary) hypertension: Secondary | ICD-10-CM | POA: Insufficient documentation

## 2014-08-09 DIAGNOSIS — G9619 Other disorders of meninges, not elsewhere classified: Secondary | ICD-10-CM | POA: Diagnosis not present

## 2014-08-09 DIAGNOSIS — M8580 Other specified disorders of bone density and structure, unspecified site: Secondary | ICD-10-CM | POA: Diagnosis not present

## 2014-08-09 DIAGNOSIS — F419 Anxiety disorder, unspecified: Secondary | ICD-10-CM | POA: Diagnosis not present

## 2014-08-09 DIAGNOSIS — Z419 Encounter for procedure for purposes other than remedying health state, unspecified: Secondary | ICD-10-CM

## 2014-08-09 DIAGNOSIS — Z882 Allergy status to sulfonamides status: Secondary | ICD-10-CM | POA: Diagnosis not present

## 2014-08-09 DIAGNOSIS — Z885 Allergy status to narcotic agent status: Secondary | ICD-10-CM | POA: Diagnosis not present

## 2014-08-09 DIAGNOSIS — Z888 Allergy status to other drugs, medicaments and biological substances status: Secondary | ICD-10-CM | POA: Diagnosis not present

## 2014-08-09 DIAGNOSIS — K219 Gastro-esophageal reflux disease without esophagitis: Secondary | ICD-10-CM | POA: Insufficient documentation

## 2014-08-09 DIAGNOSIS — M5126 Other intervertebral disc displacement, lumbar region: Secondary | ICD-10-CM | POA: Insufficient documentation

## 2014-08-09 DIAGNOSIS — Z87891 Personal history of nicotine dependence: Secondary | ICD-10-CM | POA: Insufficient documentation

## 2014-08-09 DIAGNOSIS — M4806 Spinal stenosis, lumbar region: Secondary | ICD-10-CM | POA: Diagnosis not present

## 2014-08-09 HISTORY — PX: LUMBAR LAMINECTOMY/DECOMPRESSION MICRODISCECTOMY: SHX5026

## 2014-08-09 SURGERY — LUMBAR LAMINECTOMY/DECOMPRESSION MICRODISCECTOMY 2 LEVELS
Anesthesia: General | Site: Back

## 2014-08-09 MED ORDER — OXYCODONE-ACETAMINOPHEN 5-325 MG PO TABS
1.0000 | ORAL_TABLET | ORAL | Status: DC | PRN
  Administered 2014-08-09: 2 via ORAL
  Administered 2014-08-09: 1 via ORAL
  Administered 2014-08-10 (×4): 2 via ORAL
  Filled 2014-08-09: qty 1
  Filled 2014-08-09 (×5): qty 2

## 2014-08-09 MED ORDER — HYDROMORPHONE HCL 1 MG/ML IJ SOLN
0.5000 mg | INTRAMUSCULAR | Status: DC | PRN
Start: 2014-08-09 — End: 2014-08-11
  Administered 2014-08-09: 0.5 mg via INTRAVENOUS
  Administered 2014-08-10: 1 mg via INTRAVENOUS
  Administered 2014-08-10: 0.5 mg via INTRAVENOUS
  Filled 2014-08-09 (×2): qty 1

## 2014-08-09 MED ORDER — PROMETHAZINE HCL 25 MG/ML IJ SOLN
6.2500 mg | INTRAMUSCULAR | Status: DC | PRN
Start: 2014-08-09 — End: 2014-08-09

## 2014-08-09 MED ORDER — HYDROMORPHONE HCL 2 MG/ML IJ SOLN
INTRAMUSCULAR | Status: AC
  Filled 2014-08-09: qty 1

## 2014-08-09 MED ORDER — RISAQUAD PO CAPS
1.0000 | ORAL_CAPSULE | Freq: Every day | ORAL | Status: DC
  Administered 2014-08-09 – 2014-08-11 (×3): 1 via ORAL
  Filled 2014-08-09 (×4): qty 1

## 2014-08-09 MED ORDER — AZELAIC ACID 15 % EX GEL: 1.0000 "application " | Freq: Every day | CUTANEOUS | Status: DC

## 2014-08-09 MED ORDER — ROCURONIUM BROMIDE 100 MG/10ML IV SOLN
INTRAVENOUS | Status: AC
  Filled 2014-08-09: qty 1

## 2014-08-09 MED ORDER — FENTANYL CITRATE (PF) 100 MCG/2ML IJ SOLN
INTRAMUSCULAR | Status: DC | PRN
  Administered 2014-08-09 (×3): 50 ug via INTRAVENOUS
  Administered 2014-08-09: 100 ug via INTRAVENOUS

## 2014-08-09 MED ORDER — ONDANSETRON HCL 4 MG/2ML IJ SOLN
INTRAMUSCULAR | Status: DC | PRN
  Administered 2014-08-09: 4 mg via INTRAVENOUS

## 2014-08-09 MED ORDER — SENNOSIDES-DOCUSATE SODIUM 8.6-50 MG PO TABS
1.0000 | ORAL_TABLET | Freq: Every evening | ORAL | Status: DC | PRN
Start: 2014-08-09 — End: 2014-08-11
  Administered 2014-08-10: 1 via ORAL
  Filled 2014-08-09: qty 1

## 2014-08-09 MED ORDER — DOCUSATE SODIUM 100 MG PO CAPS
100.0000 mg | ORAL_CAPSULE | Freq: Two times a day (BID) | ORAL | Status: DC
  Administered 2014-08-09 – 2014-08-11 (×4): 100 mg via ORAL

## 2014-08-09 MED ORDER — BACITRACIN 50000 UNITS IM SOLR
INTRAMUSCULAR | Status: DC | PRN
Start: 2014-08-09 — End: 2014-08-09
  Administered 2014-08-09: 500 mL

## 2014-08-09 MED ORDER — PROPOFOL 10 MG/ML IV BOLUS
INTRAVENOUS | Status: AC
  Filled 2014-08-09: qty 20

## 2014-08-09 MED ORDER — METHOCARBAMOL 500 MG PO TABS
500.0000 mg | ORAL_TABLET | Freq: Four times a day (QID) | ORAL | Status: DC | PRN
  Administered 2014-08-09 – 2014-08-11 (×6): 500 mg via ORAL
  Filled 2014-08-09 (×6): qty 1

## 2014-08-09 MED ORDER — CEFAZOLIN SODIUM-DEXTROSE 2-3 GM-% IV SOLR
INTRAVENOUS | Status: AC
  Filled 2014-08-09: qty 50

## 2014-08-09 MED ORDER — ONDANSETRON HCL 4 MG/2ML IJ SOLN
4.0000 mg | INTRAMUSCULAR | Status: DC | PRN
  Administered 2014-08-09: 4 mg via INTRAVENOUS
  Filled 2014-08-09: qty 2

## 2014-08-09 MED ORDER — PROPOFOL 10 MG/ML IV BOLUS
INTRAVENOUS | Status: DC | PRN
  Administered 2014-08-09: 160 mg via INTRAVENOUS

## 2014-08-09 MED ORDER — MENTHOL 3 MG MT LOZG
1.0000 | LOZENGE | OROMUCOSAL | Status: DC | PRN
Start: 2014-08-09 — End: 2014-08-11
  Filled 2014-08-09: qty 9

## 2014-08-09 MED ORDER — HYDROMORPHONE HCL 1 MG/ML IJ SOLN
INTRAMUSCULAR | Status: AC
  Filled 2014-08-09: qty 1

## 2014-08-09 MED ORDER — OXYCODONE-ACETAMINOPHEN 5-325 MG PO TABS: 1.0000 | ORAL_TABLET | ORAL | Status: DC | PRN

## 2014-08-09 MED ORDER — SODIUM CHLORIDE 0.9 % IR SOLN
Status: AC
  Filled 2014-08-09: qty 1

## 2014-08-09 MED ORDER — ONDANSETRON HCL 4 MG/2ML IJ SOLN
INTRAMUSCULAR | Status: AC
  Filled 2014-08-09: qty 2

## 2014-08-09 MED ORDER — CEFAZOLIN SODIUM-DEXTROSE 2-3 GM-% IV SOLR
2.0000 g | INTRAVENOUS | Status: AC
  Administered 2014-08-09: 2 g via INTRAVENOUS

## 2014-08-09 MED ORDER — HYDROCODONE-ACETAMINOPHEN 5-325 MG PO TABS
1.0000 | ORAL_TABLET | ORAL | Status: DC | PRN
  Administered 2014-08-10 – 2014-08-11 (×4): 2 via ORAL
  Filled 2014-08-09 (×4): qty 2

## 2014-08-09 MED ORDER — FENTANYL CITRATE (PF) 100 MCG/2ML IJ SOLN
INTRAMUSCULAR | Status: AC
  Filled 2014-08-09: qty 2

## 2014-08-09 MED ORDER — ACETAMINOPHEN 650 MG RE SUPP: 650.0000 mg | RECTAL | Status: DC | PRN

## 2014-08-09 MED ORDER — KCL IN DEXTROSE-NACL 20-5-0.45 MEQ/L-%-% IV SOLN
INTRAVENOUS | Status: AC
  Administered 2014-08-09: 18:00:00 via INTRAVENOUS
  Filled 2014-08-09 (×2): qty 1000

## 2014-08-09 MED ORDER — CHLORHEXIDINE GLUCONATE 4 % EX LIQD: 60.0000 mL | Freq: Once | CUTANEOUS | Status: DC

## 2014-08-09 MED ORDER — MIDAZOLAM HCL 5 MG/5ML IJ SOLN
INTRAMUSCULAR | Status: DC | PRN
  Administered 2014-08-09: 1 mg via INTRAVENOUS

## 2014-08-09 MED ORDER — MIDAZOLAM HCL 2 MG/2ML IJ SOLN
INTRAMUSCULAR | Status: AC
  Filled 2014-08-09: qty 2

## 2014-08-09 MED ORDER — PROMETHAZINE HCL 25 MG PO TABS
12.5000 mg | ORAL_TABLET | Freq: Four times a day (QID) | ORAL | Status: DC | PRN
  Administered 2014-08-09 – 2014-08-11 (×2): 12.5 mg via ORAL
  Filled 2014-08-09 (×2): qty 1

## 2014-08-09 MED ORDER — CEFAZOLIN SODIUM-DEXTROSE 2-3 GM-% IV SOLR
2.0000 g | Freq: Three times a day (TID) | INTRAVENOUS | Status: AC
  Administered 2014-08-09 – 2014-08-10 (×3): 2 g via INTRAVENOUS
  Filled 2014-08-09 (×4): qty 50

## 2014-08-09 MED ORDER — LIDOCAINE HCL (CARDIAC) 20 MG/ML IV SOLN
INTRAVENOUS | Status: DC | PRN
  Administered 2014-08-09: 60 mg via INTRAVENOUS

## 2014-08-09 MED ORDER — BISACODYL 5 MG PO TBEC
5.0000 mg | DELAYED_RELEASE_TABLET | Freq: Every day | ORAL | Status: DC | PRN
  Administered 2014-08-11: 5 mg via ORAL
  Filled 2014-08-09: qty 1

## 2014-08-09 MED ORDER — NEOSTIGMINE METHYLSULFATE 10 MG/10ML IV SOLN
INTRAVENOUS | Status: DC | PRN
  Administered 2014-08-09: 2.5 mg via INTRAVENOUS

## 2014-08-09 MED ORDER — GLYCOPYRROLATE 0.2 MG/ML IJ SOLN
INTRAMUSCULAR | Status: DC | PRN
  Administered 2014-08-09: .3 mg via INTRAVENOUS

## 2014-08-09 MED ORDER — METHOCARBAMOL 500 MG PO TABS: 500.0000 mg | ORAL_TABLET | Freq: Three times a day (TID) | ORAL | Status: DC

## 2014-08-09 MED ORDER — ESTRADIOL 0.05 MG/24HR TD PTWK: 0.0500 mg | MEDICATED_PATCH | TRANSDERMAL | Status: DC

## 2014-08-09 MED ORDER — FENTANYL CITRATE (PF) 250 MCG/5ML IJ SOLN
INTRAMUSCULAR | Status: AC
  Filled 2014-08-09: qty 5

## 2014-08-09 MED ORDER — LACTATED RINGERS IV SOLN
INTRAVENOUS | Status: DC
  Administered 2014-08-09 (×2): via INTRAVENOUS

## 2014-08-09 MED ORDER — DEXAMETHASONE SODIUM PHOSPHATE 10 MG/ML IJ SOLN
INTRAMUSCULAR | Status: AC
  Filled 2014-08-09: qty 1

## 2014-08-09 MED ORDER — NORTRIPTYLINE HCL 10 MG PO CAPS
10.0000 mg | ORAL_CAPSULE | Freq: Every day | ORAL | Status: DC
  Administered 2014-08-09 – 2014-08-10 (×2): 10 mg via ORAL
  Filled 2014-08-09 (×3): qty 1

## 2014-08-09 MED ORDER — PHENYLEPHRINE HCL 10 MG/ML IJ SOLN
INTRAMUSCULAR | Status: AC
  Filled 2014-08-09: qty 1

## 2014-08-09 MED ORDER — ACETAMINOPHEN 325 MG PO TABS: 650.0000 mg | ORAL_TABLET | ORAL | Status: DC | PRN

## 2014-08-09 MED ORDER — BUPIVACAINE-EPINEPHRINE (PF) 0.5% -1:200000 IJ SOLN
INTRAMUSCULAR | Status: DC | PRN
  Administered 2014-08-09: 15 mL

## 2014-08-09 MED ORDER — LIDOCAINE HCL (CARDIAC) 20 MG/ML IV SOLN
INTRAVENOUS | Status: AC
  Filled 2014-08-09: qty 5

## 2014-08-09 MED ORDER — GLYCOPYRROLATE 0.2 MG/ML IJ SOLN
INTRAMUSCULAR | Status: AC
  Filled 2014-08-09: qty 2

## 2014-08-09 MED ORDER — VITAMIN C 500 MG PO TABS
500.0000 mg | ORAL_TABLET | Freq: Every day | ORAL | Status: DC
  Filled 2014-08-09 (×5): qty 1

## 2014-08-09 MED ORDER — MAGNESIUM CITRATE PO SOLN: 1.0000 | Freq: Once | ORAL | Status: AC | PRN

## 2014-08-09 MED ORDER — ROCURONIUM BROMIDE 100 MG/10ML IV SOLN
INTRAVENOUS | Status: DC | PRN
  Administered 2014-08-09 (×2): 5 mg via INTRAVENOUS
  Administered 2014-08-09: 10 mg via INTRAVENOUS
  Administered 2014-08-09: 40 mg via INTRAVENOUS
  Administered 2014-08-09: 5 mg via INTRAVENOUS

## 2014-08-09 MED ORDER — THROMBIN 5000 UNITS EX SOLR
CUTANEOUS | Status: AC
  Filled 2014-08-09: qty 10000

## 2014-08-09 MED ORDER — DEXAMETHASONE SODIUM PHOSPHATE 10 MG/ML IJ SOLN
INTRAMUSCULAR | Status: DC | PRN
  Administered 2014-08-09: 10 mg via INTRAVENOUS

## 2014-08-09 MED ORDER — THROMBIN 5000 UNITS EX SOLR
OROMUCOSAL | Status: DC | PRN
  Administered 2014-08-09: 11:00:00 via TOPICAL

## 2014-08-09 MED ORDER — HYDROMORPHONE HCL 1 MG/ML IJ SOLN
INTRAMUSCULAR | Status: DC | PRN
  Administered 2014-08-09 (×2): .5 mg via INTRAVENOUS
  Administered 2014-08-09: 0.5 mg via INTRAVENOUS

## 2014-08-09 MED ORDER — EPHEDRINE SULFATE 50 MG/ML IJ SOLN
INTRAMUSCULAR | Status: AC
  Filled 2014-08-09: qty 1

## 2014-08-09 MED ORDER — DOCUSATE SODIUM 100 MG PO CAPS: 100.0000 mg | ORAL_CAPSULE | Freq: Two times a day (BID) | ORAL | Status: DC | PRN

## 2014-08-09 MED ORDER — BUPIVACAINE-EPINEPHRINE (PF) 0.5% -1:200000 IJ SOLN
INTRAMUSCULAR | Status: AC
  Filled 2014-08-09: qty 30

## 2014-08-09 MED ORDER — ALUM & MAG HYDROXIDE-SIMETH 200-200-20 MG/5ML PO SUSP
30.0000 mL | Freq: Four times a day (QID) | ORAL | Status: DC | PRN
Start: 2014-08-09 — End: 2014-08-11

## 2014-08-09 MED ORDER — FENTANYL CITRATE (PF) 100 MCG/2ML IJ SOLN
25.0000 ug | INTRAMUSCULAR | Status: DC | PRN
  Administered 2014-08-09 (×3): 50 ug via INTRAVENOUS

## 2014-08-09 MED ORDER — METHOCARBAMOL 1000 MG/10ML IJ SOLN
500.0000 mg | Freq: Four times a day (QID) | INTRAVENOUS | Status: DC | PRN
  Administered 2014-08-09: 500 mg via INTRAVENOUS
  Filled 2014-08-09 (×2): qty 5

## 2014-08-09 MED ORDER — PHENOL 1.4 % MT LIQD: 1.0000 | OROMUCOSAL | Status: DC | PRN

## 2014-08-09 SURGICAL SUPPLY — 47 items
BAG SPEC THK2 15X12 ZIP CLS (MISCELLANEOUS)
BAG ZIPLOCK 12X15 (MISCELLANEOUS) IMPLANT
CLEANER TIP ELECTROSURG 2X2 (MISCELLANEOUS) ×2 IMPLANT
CLOTH 2% CHLOROHEXIDINE 3PK (PERSONAL CARE ITEMS) ×2 IMPLANT
DRAPE MICROSCOPE LEICA (MISCELLANEOUS) ×2 IMPLANT
DRAPE POUCH INSTRU U-SHP 10X18 (DRAPES) ×2 IMPLANT
DRAPE SHEET LG 3/4 BI-LAMINATE (DRAPES) ×1 IMPLANT
DRAPE SURG 17X11 SM STRL (DRAPES) ×2 IMPLANT
DRAPE UTILITY XL STRL (DRAPES) ×2 IMPLANT
DRSG AQUACEL AG ADV 3.5X 4 (GAUZE/BANDAGES/DRESSINGS) IMPLANT
DRSG AQUACEL AG ADV 3.5X 6 (GAUZE/BANDAGES/DRESSINGS) ×1 IMPLANT
DURAPREP 26ML APPLICATOR (WOUND CARE) ×2 IMPLANT
DURASEAL SPINE SEALANT 3ML (MISCELLANEOUS) IMPLANT
ELECT BLADE TIP CTD 4 INCH (ELECTRODE) ×1 IMPLANT
ELECT REM PT RETURN 9FT ADLT (ELECTROSURGICAL) ×2
ELECTRODE REM PT RTRN 9FT ADLT (ELECTROSURGICAL) ×1 IMPLANT
GLOVE BIO SURGEON STRL SZ7.5 (GLOVE) ×2 IMPLANT
GLOVE BIOGEL PI IND STRL 7.5 (GLOVE) ×1 IMPLANT
GLOVE BIOGEL PI INDICATOR 7.5 (GLOVE) ×2
GLOVE SURG SS PI 7.5 STRL IVOR (GLOVE) ×2 IMPLANT
GLOVE SURG SS PI 8.0 STRL IVOR (GLOVE) ×4 IMPLANT
GOWN STRL REUS W/TWL XL LVL3 (GOWN DISPOSABLE) ×5 IMPLANT
IV CATH 14GX2 1/4 (CATHETERS) ×1 IMPLANT
KIT BASIN OR (CUSTOM PROCEDURE TRAY) ×2 IMPLANT
KIT POSITIONING SURG ANDREWS (MISCELLANEOUS) ×2 IMPLANT
MANIFOLD NEPTUNE II (INSTRUMENTS) ×2 IMPLANT
NEEDLE SPNL 18GX3.5 QUINCKE PK (NEEDLE) ×4 IMPLANT
PACK LAMINECTOMY ORTHO (CUSTOM PROCEDURE TRAY) ×2 IMPLANT
PATTIES SURGICAL .5 X.5 (GAUZE/BANDAGES/DRESSINGS) ×2 IMPLANT
PATTIES SURGICAL .75X.75 (GAUZE/BANDAGES/DRESSINGS) ×1 IMPLANT
PATTIES SURGICAL 1X1 (DISPOSABLE) IMPLANT
PEN SKIN MARKING BROAD (MISCELLANEOUS) ×2 IMPLANT
SPONGE SURGIFOAM ABS GEL 100 (HEMOSTASIS) ×2 IMPLANT
STRIP CLOSURE SKIN 1/2X4 (GAUZE/BANDAGES/DRESSINGS) IMPLANT
SUT NURALON 4 0 TR CR/8 (SUTURE) IMPLANT
SUT PROLENE 3 0 PS 2 (SUTURE) IMPLANT
SUT VIC AB 1 CT1 27 (SUTURE)
SUT VIC AB 1 CT1 27XBRD ANTBC (SUTURE) IMPLANT
SUT VIC AB 1-0 CT2 27 (SUTURE) IMPLANT
SUT VIC AB 2-0 CT1 27 (SUTURE)
SUT VIC AB 2-0 CT1 TAPERPNT 27 (SUTURE) IMPLANT
SUT VIC AB 2-0 CT2 27 (SUTURE) IMPLANT
SYR 3ML LL SCALE MARK (SYRINGE) ×1 IMPLANT
TOWEL OR 17X26 10 PK STRL BLUE (TOWEL DISPOSABLE) ×2 IMPLANT
TOWEL OR NON WOVEN STRL DISP B (DISPOSABLE) ×2 IMPLANT
TRAY FOLEY W/METER SILVER 14FR (SET/KITS/TRAYS/PACK) ×1 IMPLANT
YANKAUER SUCT BULB TIP NO VENT (SUCTIONS) ×2 IMPLANT

## 2014-08-09 NOTE — Anesthesia Postprocedure Evaluation (Signed)
  Anesthesia Post-op Note  Patient: Helen Glover  Procedure(s) Performed: Procedure(s): MICRO LUMBAR DECOMPRESSION L3-4,  REDO DECOMPRESSION, MICRODISCECTOMY L4-5, FORAMINOTOMY L3-L4, L4-L5 BILATERAL (N/A)  Patient Location: PACU  Anesthesia Type:General  Level of Consciousness: awake  Airway and Oxygen Therapy: Patient Spontanous Breathing  Post-op Pain: mild  Post-op Assessment: Post-op Vital signs reviewed              Post-op Vital Signs: Reviewed  Last Vitals:  Filed Vitals:   08/09/14 1452  BP: 123/49  Pulse: 66  Temp: 36.9 C  Resp: 14    Complications: No apparent anesthesia complications

## 2014-08-09 NOTE — Transfer of Care (Signed)
Immediate Anesthesia Transfer of Care Note  Patient: Helen Glover  Procedure(s) Performed: Procedure(s): MICRO LUMBAR DECOMPRESSION L3-4,  REDO DECOMPRESSION, MICRODISCECTOMY L4-5, FORAMINOTOMY L3-L4, L4-L5 BILATERAL (N/A)  Patient Location: PACU  Anesthesia Type:General  Level of Consciousness: awake and alert   Airway & Oxygen Therapy: Patient Spontanous Breathing and Patient connected to face mask oxygen  Post-op Assessment: Report given to RN and Post -op Vital signs reviewed and stable  Post vital signs: Reviewed and stable  Last Vitals:  Filed Vitals:   08/09/14 1245  BP:   Pulse: 79  Temp:   Resp:     Complications: No apparent anesthesia complications

## 2014-08-09 NOTE — Progress Notes (Signed)
Called MD/PA about saturated surgical dressing. PA gave orders to change dressing to gauze, and ABD. PA also instructed to reinforce dressing as needed, and to chart amount of drainage

## 2014-08-09 NOTE — H&P (View-Only) (Signed)
Helen Glover is an 64 y.o. female.   Chief Complaint: back and right leg pain HPI: Helen Glover follows up with her husband, still having severe pain. She is already scheduled for surgery. Her stenosis is fairly significant at 3-4, also done at 4-5 on the right. She has pain that radiates into the top of her foot in the L5 nerve root distribution. Past Medical History  Diagnosis Date  . Interstitial cystitis   . Eagle's syndrome   . Spinal stenosis   . GERD (gastroesophageal reflux disease)   . Plantar fasciitis   . Arthritis     in neck  . Wears hearing aid   . Complication of anesthesia     "sore throat"  . Heart murmur     as child  . Carpal tunnel syndrome, bilateral     rt wrist  . Tingling     rt foot  . HNP (herniated nucleus pulposus)   . Osteopenia   . Kidney cysts   . Anxiety     Past Surgical History  Procedure Laterality Date  . Cesarean section    . Myringotomy    . Lumbar laminectomy    . Carpal tunnel release      LEFT  . Trigger finger release      RIGHT  . Anal sphincterotomy    . Nasal sinus surgery    . Abdominal hysterectomy  2003    BSO  . Hemorrhoid surgery N/A 1998  . Cystoscopy    . Colonoscopy      internal hemorrhoids  . Cortisone injection      in neck  . Tympanostomy tube placement    . Tonsillectomy      LEFT only, due to Eagle Syndrome  . Tonsillectomy  10/14    right side due to Eagles syndrome    Family History  Problem Relation Age of Onset  . Diabetes Mother   . Hypertension Mother   . Hyperlipidemia Mother   . Hypertension Brother   . Hyperlipidemia Brother   . Prostate cancer Father     Bone  . Osteoporosis Mother    Social History:  reports that she quit smoking about 27 years ago. She has never used smokeless tobacco. She reports that she drinks about 3.6 - 4.2 oz of alcohol per week. She reports that she does not use illicit drugs.  Allergies:  Allergies  Allergen Reactions  . Other     Band-aid  .  Codeine Hives  . Sulfa Antibiotics Nausea And Vomiting     (Not in a hospital admission)  No results found for this or any previous visit (from the past 48 hour(s)). No results found.  Review of Systems  Constitutional: Negative.   HENT: Negative.   Eyes: Negative.   Respiratory: Negative.   Cardiovascular: Negative.   Gastrointestinal: Negative.   Genitourinary: Negative.   Musculoskeletal: Positive for back pain.  Skin: Negative.   Neurological: Positive for sensory change and focal weakness.  Psychiatric/Behavioral: Negative.     Last menstrual period 10/18/2001. Physical Exam  Constitutional: She is oriented to person, place, and time. She appears well-developed and well-nourished.  HENT:  Head: Normocephalic and atraumatic.  Eyes: Conjunctivae and EOM are normal. Pupils are equal, round, and reactive to light.  Neck: Normal range of motion. Neck supple.  Cardiovascular: Normal rate and regular rhythm.   Respiratory: Effort normal.  GI: Soft.  Musculoskeletal:  On exam, she is upright, in moderate distress. Mood and  affect is appropriate. She has a minimal discomfort with forward flexion and extension of the lumbar spine. Straight leg raises, buttock, thigh and calf pain exacerbated with dorsal augmentation maneuver. Trace EHL weakness is noted. On the right compared to the left, she has mild quad weakness on the right as well.  Neurological: She is alert and oriented to person, place, and time. She has normal reflexes.  Skin: Skin is warm and dry.    The CT myelogram demonstrates, does have spinal stenosis at 3-4 and 4-5, particularly in the lateral recess fairly significant. Mild foraminal narrowing at 5-1. She does have severe with near complete on her lateral extension and reduction in the dye column.  Assessment/Plan Spinal stenosis, history of lumbar decompression, progressive multilevel disc degeneration, minimal mechanical back pain.  We discussed options of  living with her symptoms versus a decompression, but she has failed therapy, activity modification, injection remains severely disabled, discussed decompression at 3-4 and 4-5. I had an extensive discussion of the risks and benefits of the lumbar decompression with the patient including bleeding, infection, damage to neurovascular structures, epidural fibrosis, CSF leak requiring repair. We also discussed increase in pain, adjacent segment disease, recurrent disc herniation, need for future surgery including repeat decompression and/or fusion. We also discussed risks of postoperative hematoma, paralysis, anesthetic complications including DVT, PE, death, cardiopulmonary dysfunction. In addition, the perioperative and postoperative courses were discussed in detail including the rehabilitative time and return to functional activity and work. I provided the patient with an illustrated handout and utilized the appropriate surgical models.  She is having the lesion in her kidney worked as well. Currently, has an appointment for that, surgery is scheduled for next week. Discussed time in the hospital and followup, her husband was present. We will have Dr. Jola Baptist review the myelogram again with Dr. Logan Bores. Feel her stenosis is more significant than interpreted by Dr. Jeralyn Ruths.  I had an extensive discussion of the risks and benefits of the lumbar decompression with the patient including bleeding, infection, damage to neurovascular structures, epidural fibrosis, CSF leak requiring repair. We also discussed increase in pain, adjacent segment disease, recurrent disc herniation, need for future surgery including repeat decompression and/or fusion. We also discussed risks of postoperative hematoma, paralysis, anesthetic complications including DVT, PE, death, cardiopulmonary dysfunction. In addition, the perioperative and postoperative courses were discussed in detail including the rehabilitative time and return to  functional activity and work. I provided the patient with an illustrated handout and utilized the appropriate surgical models.  Plan microlumbar decompression L3-4, possible redo L4-5  Helen Glover M. PA-C for Dr.Beane 08/08/2014, 5:48 PM

## 2014-08-09 NOTE — Interval H&P Note (Signed)
History and Physical Interval Note:  08/09/2014 8:45 AM  Helen Glover  has presented today for surgery, with the diagnosis of HNP/STENOSIS  The various methods of treatment have been discussed with the patient and family. After consideration of risks, benefits and other options for treatment, the patient has consented to  Procedure(s): MICRO LUMBAR DECOMPRESSION L3-4/POSSIBLE REDO DECOMPRESSION L4-5 (N/A) as a surgical intervention .  The patient's history has been reviewed, patient examined, no change in status, stable for surgery.  I have reviewed the patient's chart and labs.  Questions were answered to the patient's satisfaction.     Feven Alderfer C

## 2014-08-09 NOTE — Brief Op Note (Deleted)
08/09/2014  12:31 PM  PATIENT:  Helen Glover  64 y.o. female  PRE-OPERATIVE DIAGNOSIS:  HNP/STENOSIS L3-L4, L4-L5  POST-OPERATIVE DIAGNOSIS:  HNP/STENOSIS L3-L4, L4-L5  PROCEDURE:  Procedure(s): MICRO LUMBAR DECOMPRESSION L3-4,  REDO DECOMPRESSION, MICRODISCECTOMY L4-5, FORAMINOTOMY L3-L4, L4-L5 BILATERAL (N/A)  SURGEON:  Surgeon(s) and Role:    * Susa Day, MD - Primary  PHYSICIAN ASSISTANT:   ASSISTANTS: [place]   ANESTHESIA:   general  EBL:  Total I/O In: -  Out: 300 [Urine:200; Blood:100]  BLOOD ADMINISTERED:none  DRAINS: none   LOCAL MEDICATIONS USED:  MARCAINE     SPECIMEN:  Source of Specimen:  [id number]  DISPOSITION OF SPECIMEN:  PATHOLOGY  COUNTS:  YES  TOURNIQUET:  * No tourniquets in log *  DICTATION: .Other Dictation: Dictation Number     PLAN OF CARE: Admit for overnight observation  PATIENT DISPOSITION:  PACU - hemodynamically stable.   Delay start of Pharmacological VTE agent (>24hrs) due to surgical blood loss or risk of bleeding: yes

## 2014-08-09 NOTE — Discharge Instructions (Signed)

## 2014-08-09 NOTE — Anesthesia Preprocedure Evaluation (Signed)
Anesthesia Evaluation  Patient identified by MRN, date of birth, ID band Patient awake    Reviewed: Allergy & Precautions, NPO status , Patient's Chart, lab work & pertinent test results  Airway Mallampati: II  TM Distance: >3 FB Neck ROM: Full    Dental   Pulmonary former smoker,  breath sounds clear to auscultation        Cardiovascular hypertension, Rhythm:Regular Rate:Normal     Neuro/Psych    GI/Hepatic Neg liver ROS, GERD-  ,  Endo/Other    Renal/GU Renal disease     Musculoskeletal   Abdominal   Peds  Hematology   Anesthesia Other Findings   Reproductive/Obstetrics                             Anesthesia Physical Anesthesia Plan  ASA: II  Anesthesia Plan: General   Post-op Pain Management:    Induction: Intravenous  Airway Management Planned: Oral ETT  Additional Equipment:   Intra-op Plan:   Post-operative Plan: Extubation in OR  Informed Consent: I have reviewed the patients History and Physical, chart, labs and discussed the procedure including the risks, benefits and alternatives for the proposed anesthesia with the patient or authorized representative who has indicated his/her understanding and acceptance.   Dental advisory given  Plan Discussed with: CRNA and Anesthesiologist  Anesthesia Plan Comments:         Anesthesia Quick Evaluation

## 2014-08-09 NOTE — Op Note (Signed)
Helen Glover, Helen Glover               ACCOUNT NO.:  [id number]  MEDICAL RECORD NO.:  [id number]  LOCATION:  36                         FACILITY:  [facility]  PHYSICIAN:  Susa Day, M.D.    DATE OF BIRTH:  Jun 18, 1950  DATE OF PROCEDURE:  08/09/2014 DATE OF DISCHARGE:                              OPERATIVE REPORT   PREOPERATIVE DIAGNOSES:  Spinal stenosis, recurrent disk herniation at L4-5, recurrent spinal stenosis at 4-5.  POSTOPERATIVE DIAGNOSES:  Spinal stenosis, recurrent disk herniation at L4-5, recurrent spinal stenosis at 4-5.  PROCEDURE PERFORMED: 1. Redo lumbar decompression, L4-5, L3-4,. 2. Gill laminectomy of L4. 3. Bilateral foraminotomies of L3, L4, and L5. 4. Microdiskectomy, 4-5 right.  ANESTHESIA:  General.  ASSISTANT:  Cleophas Dunker, PA.  HISTORY:  This is a 64 year old female with severe right lower extremity radicular pain, multi-dermatomal, anterolateral thigh pain radiating into the dorsum of the foot with EHL weakness.  She had an MRI followed by CT myelogram, has had a history of a decompression of 4-5 bilaterally.  In extension, she had a block at 3-4, had fairly significant lateral recess stenosis at 4-5 with disk protrusion at 4-5. Predominantly, right lower extremity pain, she was indicated for a decompression.  She had disk space narrowing at multiple levels. Discussed decompression at 3-4 and at 4-5, again the complete block. Due to the L5 radicular pain, I felt that her symptoms in the lateral recess at 4-5 were significant as well.  We discussed risks and benefits including bleeding, infection, damage to neurovascular structures, DVT, PE, anesthetic complications, persistent symptoms, need for fusion in the future, etc.  TECHNIQUE:  With the patient in a supine position, after the induction of adequate general anesthesia with 2 g Kefzol, Foley to gravity, all bony prominences well padded.  She was placed in prone position.  Lumbar region  was prepped and draped in usual sterile fashion.  Two 18-gauge spinal needles were utilized to localize the operative area.  I made an incision from the spinous process of 3 to just below 5.  Subcutaneous tissue was dissected.  Electrocautery was utilized to achieve hemostasis.  Marcaine 0.25% with epinephrine was infiltrated in the paraspinous tissues.  Dorsal lumbar fascia was identified and divided in line with skin incision.  Paraspinous muscles were elevated from the lamina of 3-4 and 4-5.  Confirmatory radiographs obtained with the spinous processes identified at 3, 4, and 5.  She had fairly absent interlaminar windows at 3-4 and at 4-5.  At 4-5, there was exuberant scar tissue noted.  We therefore felt a central decompression was warranted.  I removed the spinous processes of 3 and 4, partially of 5. First attention was turned towards 3-4.  We detached the ligamentum flavum from the caudad edge of 3 and the cephalad edge of 4 utilizing straight curette.  Hemilaminotomy was bilaterally performed in the inferior edge of 3, continued cephalad to the point of detaching the ligamentum flavum.  We then removed ligamentum flavum from the interspace.  We removed hypertrophic ligamentum bilaterally.  There was severe stenosis noted.  Neuro patties were placed beneath the ligamentum flavum.  We decompressed the lateral recess to the medial border of the  pedicle.  Undercutting the facets bilaterally performed foraminotomies of L3 and L4.  Noted was attenuated thecal sac at the area of the central constriction, but no evidence of CSF leakage.  We then skeletonized the previous bilateral hemilaminotomies at 4-5.  I then performed hemilaminotomy of the caudad edge of 4 bilaterally developing a plane between the thecal sac, the epidural fibrosis, and the remaining ligamentum flavum, and neural arch of 4.  The neural arch of L4 was then removed.  We decompressed the lateral recess to the medial  border of the pedicle.  Severe lateral recess stenosis was noted bilaterally at 4-5. Enlarged the foraminotomy of L5 on the right, identified the 5 root.  We gently mobilized it, decompressed the lateral recess to the medial border of the pedicle.  There was arthritic and hypertrophic facet. Remnants of the corticosteroid injection were noted along the lateral recess at 4-5.  Continued developing a plane meticulously between the previous area of the scar, the epidural fibrosis, and previous laminotomy up to the pedicle of 4.  This was done bilaterally. Performed foraminotomies on the left as well freeing up the thecal sac. There was no disk herniation noted at 3-4.  Noted was a focal HNP at 4-5 on the right further displacing the 5 root, therefore isolated the disk and performed an annulotomy and copious portion of the distal material was removed from the disc space from both sides of the operating room table.  Mobilized the disk space with micropituitary, Penfield, irrigated disk space with antibiotic irrigation.  Some adhesions were noted laterally on the right of thecal sac to the lateral aspect of the lamina at 4, this remained as there was significant adhesion.  I was able to pass a neural probe out the foramen of 3 and 4 bilaterally.  We confirmed the decompression with a probe, passed out the foramen of 3 and of 5.  Neural probe passed freely up the foramens of 3, 4, and 5 bilaterally particularly on the right where she was symptomatic. Bipolar electrocautery was utilized to achieve hemostasis as was bone wax on the cancellous surfaces.  Good restoration of thecal sac was noted.  Performed Valsalva's x3 to 40 mmHg.  No evidence of CSF leakage. I placed thrombin-soaked Gelfoam in the laminotomy defect.  Again, there were severe adhesions noted in previous laminotomy defects.  We meticulously developed a plane between the thecal sac, the epidural fibrosis, and the previous laminotomy  without incident.  Paraspinous muscles were inspected, no evidence of active bleeding.  Copiously irrigated the paraspinous musculature.  We repaired the dorsolumbar fascia with #1 Vicryl interrupted figure-of-eight sutures.  Subcutaneous tissue was repaired with 2-0 Vicryl simple sutures.  Skin was reapproximated with staples to the previous scar.  Sterile dressing applied.  Placed supine on the hospital bed, extubated without difficulty, and transported to the recovery room in a satisfactory condition.  The patient tolerated the procedure well.  No complications.  Assistant, Cleophas Dunker, PA was used throughout the case for patient positioning, gentle intermittent neural traction, and operating microscope closure, etc.     Susa Day, M.D.     Geralynn Rile  D:  08/09/2014  T:  08/09/2014  Job:  [id number]

## 2014-08-09 NOTE — Anesthesia Procedure Notes (Signed)
Procedure Name: Intubation Date/Time: 08/09/2014 9:23 AM Performed by: Anne Fu Pre-anesthesia Checklist: Patient identified, Emergency Drugs available, Suction available, Patient being monitored and Timeout performed Patient Re-evaluated:Patient Re-evaluated prior to inductionOxygen Delivery Method: Circle system utilized Preoxygenation: Pre-oxygenation with 100% oxygen Intubation Type: IV induction Ventilation: Mask ventilation without difficulty Laryngoscope Size: Mac and 3 Grade View: Grade III Tube type: Oral Tube size: 7.5 mm Number of attempts: 2 Airway Equipment and Method: Stylet Placement Confirmation: ETT inserted through vocal cords under direct vision,  positive ETCO2,  CO2 detector and breath sounds checked- equal and bilateral Secured at: 20 cm Tube secured with: Tape Dental Injury: Teeth and Oropharynx as per pre-operative assessment

## 2014-08-09 NOTE — Brief Op Note (Signed)
08/09/2014  12:40 PM  PATIENT:  Helen Glover  64 y.o. female  PRE-OPERATIVE DIAGNOSIS:  HNP/STENOSIS L3-L4, L4-L5  POST-OPERATIVE DIAGNOSIS:  HNP/STENOSIS L3-L4, L4-L5  PROCEDURE:  Procedure(s): MICRO LUMBAR DECOMPRESSION L3-4,  REDO DECOMPRESSION, MICRODISCECTOMY L4-5, FORAMINOTOMY L3-L4, L4-L5 BILATERAL (N/A)  SURGEON:  Surgeon(s) and Role:    * Susa Day, MD - Primary  PHYSICIAN ASSISTANT:   ASSISTANTS: Bissell   ANESTHESIA:   general  EBL:  Total I/O In: -  Out: 300 [Urine:200; Blood:100]  BLOOD ADMINISTERED:none  DRAINS: none   LOCAL MEDICATIONS USED:  MARCAINE     SPECIMEN:  Source of Specimen:  [id number]  DISPOSITION OF SPECIMEN:  PATHOLOGY  COUNTS:  YES  TOURNIQUET:  * No tourniquets in log *  DICTATION: .Other Dictation: Dictation Number [id number]  PLAN OF CARE: Admit for overnight observation  PATIENT DISPOSITION:  PACU - hemodynamically stable.   Delay start of Pharmacological VTE agent (>24hrs) due to surgical blood loss or risk of bleeding: yes

## 2014-08-10 ENCOUNTER — Encounter (HOSPITAL_COMMUNITY): Payer: Self-pay | Admitting: Specialist

## 2014-08-10 DIAGNOSIS — M5126 Other intervertebral disc displacement, lumbar region: Secondary | ICD-10-CM | POA: Diagnosis not present

## 2014-08-10 MED ORDER — IBUPROFEN 800 MG PO TABS: 800.0000 mg | ORAL_TABLET | Freq: Three times a day (TID) | ORAL | Status: DC | PRN

## 2014-08-10 NOTE — Progress Notes (Signed)
Subjective: 1 Day Post-Op Procedure(s) (LRB): MICRO LUMBAR DECOMPRESSION L3-4,  REDO DECOMPRESSION, MICRODISCECTOMY L4-5, FORAMINOTOMY L3-L4, L4-L5 BILATERAL (N/A) Patient reports pain as mild.  Seen by Dr. Tonita Cong this AM.  Objective: Vital signs in last 24 hours: Temp:  [97.8 F (36.6 C)-98.5 F (36.9 C)] 98.2 F (36.8 C) (06/23 0627) Pulse Rate:  [64-88] 68 (06/23 0627) Resp:  [7-19] 16 (06/23 0627) BP: (100-125)/(45-83) 107/51 mmHg (06/23 0627) SpO2:  [96 %-100 %] 100 % (06/23 0627) Weight:  [53.978 kg (119 lb)] 53.978 kg (119 lb) (06/22 0807)  Intake/Output from previous day: 06/22 0701 - 06/23 0700 In: 2810 [P.O.:720; I.V.:1985; IV Piggyback:105] Out: 2605 [Urine:2505; Blood:100] Intake/Output this shift:    No results for input(s): HGB in the last 72 hours. No results for input(s): WBC, RBC, HCT, PLT in the last 72 hours. No results for input(s): NA, K, CL, CO2, BUN, CREATININE, GLUCOSE, CALCIUM in the last 72 hours. No results for input(s): LABPT, INR in the last 72 hours.  Neurologically intact ABD soft Neurovascular intact Sensation intact distally Intact pulses distally Dorsiflexion/Plantar flexion intact Incision: dressing C/D/I and moderate drainage No cellulitis present Compartment soft no sign of DVT  Assessment/Plan: 1 Day Post-Op Procedure(s) (LRB): MICRO LUMBAR DECOMPRESSION L3-4,  REDO DECOMPRESSION, MICRODISCECTOMY L4-5, FORAMINOTOMY L3-L4, L4-L5 BILATERAL (N/A) Advance diet Up with therapy D/C IV fluids  Up with PT today If unable to void 6 hours after foley removed, bladder scan, straight cath prn Place new aquacel dressing today Possible D/C later today after PT if pain remains well controlled otherwise plan for D/C tomorrow  Cecilie Kicks. 08/10/2014, 7:36 AM

## 2014-08-10 NOTE — Evaluation (Signed)
Physical Therapy Evaluation Patient Details Name: Helen Glover MRN: [id number] DOB: [birth year] Today's Date: 08/10/2014   History of Present Illness  MICRO LUMBAR DECOMPRESSION L3-4, REDO DECOMPRESSION, MICRODISCECTOMY L4-5, FORAMINOTOMY L3-L4, L4-L5 BILATERAL   Clinical Impression  Patient moving well.  Patient will benefit from PT to address problems listed in note below.    Follow Up Recommendations No PT follow up    Equipment Recommendations  None recommended by PT    Recommendations for Other Services       Precautions / Restrictions Precautions Precautions: Back Precaution Booklet Issued: Yes (comment) Precaution Comments: Issued back care handout and reviewed precautions. Pt needed min cues for "no arching."  Restrictions Weight Bearing Restrictions: No      Mobility  Bed Mobility Overal bed mobility: Needs Assistance Bed Mobility: Rolling Rolling: Supervision         General bed mobility comments: cues for technique and back precautions  Transfers Overall transfer level: Needs assistance Equipment used: None Transfers: Sit to/from Stand Sit to Stand: Supervision         General transfer comment: cues for back precautions.  Ambulation/Gait Ambulation/Gait assistance: Supervision Ambulation Distance (Feet): 400 Feet Assistive device: None Gait Pattern/deviations: Step-through pattern     General Gait Details: gait is slow but requires no  AD  Stairs            Wheelchair Mobility    Modified Rankin (Stroke Patients Only)       Balance Overall balance assessment: Independent                                           Pertinent Vitals/Pain Pain Assessment: 0-10 Pain Score: 4  Pain Location: back Pain Descriptors / Indicators: Aching;Sore Pain Intervention(s): Monitored during session;Premedicated before session    Home Living Family/patient expects to be discharged to:: Private residence Living  Arrangements: Spouse/significant other;Parent Available Help at Discharge: Family           Home Equipment: Shower seat - built in      Prior Function Level of Independence: Independent               Journalist, newspaper        Extremity/Trunk Assessment   Upper Extremity Assessment: Overall WFL for tasks assessed           Lower Extremity Assessment: Overall WFL for tasks assessed         Communication   Communication: No difficulties  Cognition Arousal/Alertness: Awake/alert Behavior During Therapy: WFL for tasks assessed/performed Overall Cognitive Status: Within Functional Limits for tasks assessed                      General Comments      Exercises        Assessment/Plan    PT Assessment Patient needs continued PT services  PT Diagnosis Difficulty walking;Acute pain   PT Problem List Decreased strength;Decreased mobility;Decreased knowledge of precautions  PT Treatment Interventions Gait training;Stair training;Functional mobility training;Patient/family education   PT Goals (Current goals can be found in the Care Plan section) Acute Rehab PT Goals Patient Stated Goal: home when ready PT Goal Formulation: With patient Time For Goal Achievement: 08/12/14 Potential to Achieve Goals: Good    Frequency Min 5X/week   Barriers to discharge        Co-evaluation  End of Session   Activity Tolerance: Patient tolerated treatment well Patient left: in chair;with call bell/phone within reach;with nursing/sitter in room Nurse Communication: Mobility status    Functional Assessment Tool Used: clinical judgement Functional Limitation: Mobility: Walking and moving around Mobility: Walking and Moving Around Current Status (X5284): At least 20 percent but less than 40 percent impaired, limited or restricted Mobility: Walking and Moving Around Goal Status [phone]): At least 1 percent but less than 20 percent impaired, limited  or restricted    Time: 0905-0931 PT Time Calculation (min) (ACUTE ONLY): 26 min   Charges:   PT Evaluation $Initial PT Evaluation Tier I: 1 Procedure     PT G Codes:   PT G-Codes **NOT FOR INPATIENT CLASS** Functional Assessment Tool Used: clinical judgement Functional Limitation: Mobility: Walking and moving around Mobility: Walking and Moving Around Current Status (W1027): At least 20 percent but less than 40 percent impaired, limited or restricted Mobility: Walking and Moving Around Goal Status [phone]): At least 1 percent but less than 20 percent impaired, limited or restricted    Claretha Cooper 08/10/2014, 11:08 AM Tresa Endo PT [phone]

## 2014-08-10 NOTE — Evaluation (Addendum)
Occupational Therapy One Time Evaluation Patient Details Name: Helen Glover MRN: [id number] DOB: 1950-04-09 Today's Date: 08/10/2014    History of Present Illness MICRO LUMBAR DECOMPRESSION L3-4, REDO DECOMPRESSION, MICRODISCECTOMY L4-5, FORAMINOTOMY L3-L4, L4-L5 BILATERAL    Clinical Impression   Pt overall at supervision to min guard assist level with most ADL but requires mod assist with LB self care and toilet hygiene. Educated pt on toilet aid option and coverage to help her perform toilet hygiene on her own and adhere to precautions. Also educated on AE for LB self care to increase independence. Pt will have spouse and mother to help at d/c. All education completed with pt. No further acute OT needs.     Follow Up Recommendations  No OT follow up;Supervision - Intermittent    Equipment Recommendations  None recommended by OT    Recommendations for Other Services       Precautions / Restrictions Precautions Precautions: Back Precaution Booklet Issued: Yes (comment) Precaution Comments: Issued back care handout and reviewed precautions. Pt needed min cues for "no arching."  Restrictions Weight Bearing Restrictions: No      Mobility Bed Mobility               General bed mobility comments: in chair.  Transfers Overall transfer level: Needs assistance Equipment used: None Transfers: Sit to/from Stand Sit to Stand: Supervision         General transfer comment: cues for back precautions.    Balance                                            ADL Overall ADL's : Needs assistance/impaired Eating/Feeding: Independent;Sitting   Grooming: Wash/dry hands;Supervision/safety;Standing   Upper Body Bathing: Set up;Sitting;Supervision/ safety   Lower Body Bathing: moderate assistance;Sit to/from stand   Upper Body Dressing : Set up;Supervision/safety;Sitting   Lower Body Dressing: Moderate assistance;Sit to/from stand   Toilet Transfer:  Min guard;Ambulation;Comfort height toilet   Toileting- Clothing Manipulation and Hygiene: Moderate assistance;Sit to/from stand   Tub/ Shower Transfer: Walk-in shower;Min guard     General ADL Comments: Pt able to cross LE part of the way up but not comfortably and to sustain long enough to don clothing. 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 She plans to wear sandals and not wear socks. She has a long handle sponge to wash at LEs. Demonstrated how to use reacher to don LB clothing and pt verbalized understanding. Pt will need a toilet aid as she cant reach posterior periarea without twisting. She can reach front periarea if she stands and squats but not in sitting. She states she can obtain a toilet aid and educated on where to obtain. She has a seat in the shower so discussed sitting and using LHS. Pt needs intermittent cues to not twist to look at clock. She has a standard toilet and her feet do not touch the floor here so she has to slide forward on commode to get feet as much on the floor as possible. She can push through her thighs with UEs and stand from commode without LOB and feel this will be easier with her standard height commode as she is 4 foot 11 approximately and she states her feet do touch teh floor at home. Issued back care handout and reviewed information.  Vision     Perception     Praxis      Pertinent Vitals/Pain Pain Assessment: 0-10 Pain Score: 4  Pain Location: back Pain Descriptors / Indicators: Aching;Sore Pain Intervention(s): Monitored during session;Premedicated before session     Hand Dominance     Extremity/Trunk Assessment Upper Extremity Assessment Upper Extremity Assessment: Overall WFL for tasks assessed          Communication Communication Communication: No difficulties   Cognition Arousal/Alertness: Awake/alert Behavior During Therapy: WFL for tasks assessed/performed Overall  Cognitive Status: Within Functional Limits for tasks assessed                     General Comments       Exercises       Shoulder Instructions      Home Living Family/patient expects to be discharged to:: Private residence Living Arrangements: Spouse/significant other;Parent Available Help at Discharge: Family               Bathroom Shower/Tub: Occupational psychologist: Standard     Home Equipment: Civil engineer, contracting - built in          Prior Functioning/Environment Level of Independence: Independent             OT Diagnosis: Generalized weakness   OT Problem List:     OT Treatment/Interventions:      OT Goals(Current goals can be found in the care plan section) Acute Rehab OT Goals Patient Stated Goal: home when ready OT Goal Formulation: With patient  OT Frequency:     Barriers to D/C:            Co-evaluation              End of Session    Activity Tolerance: Patient tolerated treatment well Patient left: in chair;with call bell/phone within reach   Time: 0950-1027 OT Time Calculation (min): 37 min Charges:  OT General Charges $OT Visit: 1 Procedure OT Evaluation $Initial OT Evaluation Tier I: 1 Procedure OT Treatments $Therapeutic Activity: 8-22 mins G-Codes: OT G-codes **NOT FOR INPATIENT CLASS** Functional Assessment Tool Used: clinical judgement Functional Limitation: Self care Self Care Current Status (I2979): At least 20 percent but less than 40 percent impaired, limited or restricted Self Care Goal Status (G9211): At least 20 percent but less than 40 percent impaired, limited or restricted Self Care Discharge Status [phone]): At least 20 percent but less than 40 percent impaired, limited or restricted  [place], [place] 08/10/2014, 11:06 AM

## 2014-08-10 NOTE — Progress Notes (Signed)
Physical Therapy Treatment Patient Details Name: Helen Glover MRN: [id number] DOB: [birth year] Today's Date: 08/10/2014    History of Present Illness MICRO LUMBAR DECOMPRESSION L3-4, REDO DECOMPRESSION, MICRODISCECTOMY L4-5, FORAMINOTOMY L3-L4, L4-L5 BILATERAL     PT Comments    Progressing, requires no assistive device fopr ambulation. Has practiced stairs.  Follow Up Recommendations  No PT follow up     Equipment Recommendations  None recommended by PT    Recommendations for Other Services       Precautions / Restrictions Precautions Precautions: Back    Mobility  Bed Mobility   Bed Mobility: Rolling;Sit to Sidelying         Sit to sidelying: Supervision General bed mobility comments: cues for technique and back precautions  Transfers   Equipment used: None Transfers: Sit to/from Stand Sit to Stand: Supervision         General transfer comment: cues for back precautions.  Ambulation/Gait Ambulation/Gait assistance: Supervision Ambulation Distance (Feet): 200 Feet Assistive device: None Gait Pattern/deviations: Step-through pattern     General Gait Details: gait is slow but requires no  AD   Stairs Stairs: Yes Stairs assistance: Supervision Stair Management: One rail Right;Alternating pattern;Forwards Number of Stairs: 3    Wheelchair Mobility    Modified Rankin (Stroke Patients Only)       Balance                                    Cognition Arousal/Alertness: Awake/alert                          Exercises      General Comments        Pertinent Vitals/Pain Pain Score: 5  Pain Location: back Pain Descriptors / Indicators: Aching;Tightness Pain Intervention(s): Monitored during session;Premedicated before session    Home Living                      Prior Function            PT Goals (current goals can now be found in the care plan section) Progress towards PT goals: Progressing  toward goals    Frequency  Min 5X/week    PT Plan Current plan remains appropriate    Co-evaluation             End of Session   Activity Tolerance: Patient tolerated treatment well Patient left: in bed;with call bell/phone within reach     Time: 1340-1354 PT Time Calculation (min) (ACUTE ONLY): 14 min  Charges:  $Gait Training: 8-22 mins                    G Codes:      Claretha Cooper 08/10/2014, 4:40 PM

## 2014-08-11 DIAGNOSIS — M5126 Other intervertebral disc displacement, lumbar region: Secondary | ICD-10-CM | POA: Diagnosis not present

## 2014-08-11 MED ORDER — PROMETHAZINE HCL 12.5 MG PO TABS: 12.5000 mg | ORAL_TABLET | Freq: Four times a day (QID) | ORAL | Status: DC | PRN

## 2014-08-11 NOTE — Progress Notes (Signed)
Spoke with Lacie Draft, who said that patient told her that dulcolax is usually very effective for her and that it is okay to proceed with discharge.

## 2014-08-11 NOTE — Progress Notes (Signed)
Physical Therapy Treatment Patient Details Name: Helen Glover MRN: [id number] DOB: 12-05-1950 Today's Date: 08/11/2014    History of Present Illness MICRO LUMBAR DECOMPRESSION L3-4, REDO DECOMPRESSION, MICRODISCECTOMY L4-5, FORAMINOTOMY L3-L4, L4-L5 BILATERAL     PT Comments    Patient reports increased pain. Reviewed  That patient should limit sitting and  Move around every hour,ambulate frequently.   Follow Up Recommendations  No PT follow up     Equipment Recommendations  None recommended by PT    Recommendations for Other Services       Precautions / Restrictions Precautions Precautions: Back Precaution Comments: reviewed precautions Restrictions Weight Bearing Restrictions: No    Mobility  Bed Mobility             Sit to sidelying: Supervision General bed mobility comments: cues for technique and back precautions  Transfers   Equipment used: None Transfers: Sit to/from Stand Sit to Stand: Modified independent (Device/Increase time)            Ambulation/Gait Ambulation/Gait assistance: Supervision Ambulation Distance (Feet): 200 Feet Assistive device: None Gait Pattern/deviations: Step-through pattern     General Gait Details: gait is slow but requires no  AD   Stairs            Wheelchair Mobility    Modified Rankin (Stroke Patients Only)       Balance                                    Cognition Arousal/Alertness: Awake/alert                          Exercises General Exercises - Lower Extremity Hip Flexion/Marching: AROM;Both;10 reps;Standing    General Comments        Pertinent Vitals/Pain Pain Score: 8  Pain Location: back and R leg to foot Pain Descriptors / Indicators: Aching;Tightness;Tingling;Radiating;Guarding;Grimacing Pain Intervention(s): Premedicated before session;Repositioned;Monitored during session    Home Living                      Prior Function             PT Goals (current goals can now be found in the care plan section) Progress towards PT goals: Progressing toward goals    Frequency  Min 5X/week    PT Plan Current plan remains appropriate    Co-evaluation             End of Session   Activity Tolerance: Patient limited by pain Patient left: in bed;with call bell/phone within reach     Time: 9767-3419 PT Time Calculation (min) (ACUTE ONLY): 17 min  Charges:                       G Codes:      Claretha Cooper 08/11/2014, 8:59 AM Tresa Endo PT [phone]

## 2014-08-11 NOTE — Progress Notes (Signed)
[place] and left message for [facility] to notify that patient's stomach is distended, firm and hypoactive. Patient is passing gas. Last BM 08-08-14. Awaiting callback.

## 2014-08-11 NOTE — Discharge Summary (Signed)
Physician Discharge Summary   Patient ID: Helen Glover MRN: [id number] DOB/AGE: 09/07/1950 64 y.o.  Admit date: 08/09/2014 Discharge date: 08/11/2014  Primary Diagnosis:   HNP/STENOSIS L3-L4, L4-L5  Admission Diagnoses:  Past Medical History  Diagnosis Date  . Interstitial cystitis   . Eagle's syndrome   . Spinal stenosis   . GERD (gastroesophageal reflux disease)   . Plantar fasciitis   . Arthritis     in neck  . Wears hearing aid   . Complication of anesthesia     "sore throat"  . Heart murmur     as child  . Carpal tunnel syndrome, bilateral     rt wrist  . Tingling     rt foot  . HNP (herniated nucleus pulposus)   . Osteopenia   . Kidney cysts   . Anxiety    Discharge Diagnoses:   Principal Problem:   Spinal stenosis Active Problems:   HNP (herniated nucleus pulposus), lumbar   Spinal stenosis at L4-L5 level  Procedure:  Procedure(s) (LRB): MICRO LUMBAR DECOMPRESSION L3-4,  REDO DECOMPRESSION, MICRODISCECTOMY L4-5, FORAMINOTOMY L3-L4, L4-L5 BILATERAL (N/A)   Consults: None  HPI:  see H&P    Laboratory Data: Hospital Outpatient Visit on 08/03/2014  Component Date Value Ref Range Status  . MRSA, PCR 08/03/2014 NEGATIVE  NEGATIVE Final  . Staphylococcus aureus 08/03/2014 NEGATIVE  NEGATIVE Final   Comment:        The Xpert SA Assay (FDA approved for NASAL specimens in patients over 67 years of age), is one component of a comprehensive surveillance program.  Test performance has been validated by [facility] for patients greater than or equal to 20 year old. It is not intended to diagnose infection nor to guide or monitor treatment.   . Sodium 08/03/2014 139  135 - 145 mmol/L Final  . Potassium 08/03/2014 4.8  3.5 - 5.1 mmol/L Final  . Chloride 08/03/2014 102  101 - 111 mmol/L Final  . CO2 08/03/2014 30  22 - 32 mmol/L Final  . Glucose, Bld 08/03/2014 108* 65 - 99 mg/dL Final  . BUN 08/03/2014 18  6 - 20 mg/dL Final  . Creatinine, Ser  08/03/2014 0.66  0.44 - 1.00 mg/dL Final  . Calcium 08/03/2014 9.2  8.9 - 10.3 mg/dL Final  . GFR calc non Af Amer 08/03/2014 >60  >60 mL/min Final  . GFR calc Af Amer 08/03/2014 >60  >60 mL/min Final   Comment: (NOTE) The eGFR has been calculated using the CKD EPI equation. This calculation has not been validated in all clinical situations. eGFR's persistently <60 mL/min signify possible Chronic Kidney Disease.   . Anion gap 08/03/2014 7  5 - 15 Final  . WBC 08/03/2014 6.3  4.0 - 10.5 K/uL Final  . RBC 08/03/2014 4.10  3.87 - 5.11 MIL/uL Final  . Hemoglobin 08/03/2014 13.1  12.0 - 15.0 g/dL Final  . HCT 08/03/2014 38.6  36.0 - 46.0 % Final  . MCV 08/03/2014 94.1  78.0 - 100.0 fL Final  . MCH 08/03/2014 32.0  26.0 - 34.0 pg Final  . MCHC 08/03/2014 33.9  30.0 - 36.0 g/dL Final  . RDW 08/03/2014 12.6  11.5 - 15.5 % Final  . Platelets 08/03/2014 232  150 - 400 K/uL Final   No results for input(s): HGB in the last 72 hours. No results for input(s): WBC, RBC, HCT, PLT in the last 72 hours. No results for input(s): NA, K, CL, CO2, BUN, CREATININE, GLUCOSE, CALCIUM in  the last 72 hours. No results for input(s): LABPT, INR in the last 72 hours.  X-Rays:Dg Lumbar Spine 2-3 Views  08/03/2014   CLINICAL DATA:  Lumbar spine surgery.  EXAM: LUMBAR SPINE - 2-3 VIEW  COMPARISON:  Myelogram 07/21/2014.  CT 07/21/2014  FINDINGS: Lumbar vertebral numbered as per prior CT myelogram. Diffuse degenerative change. No acute abnormality. Mild scoliosis concave left.  IMPRESSION: Diffuse degenerative change with mild scoliosis concave left.   Electronically Signed   By: Marcello Moores  Register   On: 08/03/2014 12:13   Ct Lumbar Spine W Contrast  08/07/2014   ADDENDUM REPORT: 08/07/2014 08:02  ADDENDUM: Spinal stenosis at L3-4 increases with upright extension, where it appears severe.  Study was reviewed by telephone with Dr. Tonita Cong on 08/07/2014 at 8:00 am.   Electronically Signed   By: Logan Bores   On: 08/07/2014  08:02   08/07/2014   CLINICAL DATA:  Lumbar spinal stenosis. Right foot pain. Some low back and right lower extremity pain. Prior lumbar surgery.  EXAM: LUMBAR MYELOGRAM  FLUOROSCOPY TIME:  Fluoroscopy Time (in minutes and seconds): 0 minutes 56 seconds  Number of Acquired Images:  12  PROCEDURE: After thorough discussion of risks and benefits of the procedure including bleeding, infection, injury to nerves, blood vessels, adjacent structures as well as headache and CSF leak, written and oral informed consent was obtained. Consent was obtained by Dr. Logan Bores. Time out form was completed.  Patient was positioned prone on the fluoroscopy table. Local anesthesia was provided with 1% lidocaine without epinephrine after prepped and draped in the usual sterile fashion. Puncture was performed at L5-S1 using a 3 1/2 inch 22-gauge spinal needle via a left paramedian approach. Using a single pass through the dura, the needle was placed within the thecal sac, with return of clear CSF. 15 mL of Omnipaque-180 was injected into the thecal sac, with normal opacification of the nerve roots and cauda equina consistent with free flow within the subarachnoid space.  I personally performed the lumbar puncture and administered the intrathecal contrast. I also personally supervised acquisition of the myelogram images.  TECHNIQUE: Contiguous axial images were obtained through the Lumbar spine after the intrathecal infusion of contrast. Coronal and sagittal reconstructions were obtained of the axial image sets.  COMPARISON:  Lumbar spine MRI 06/19/2014 from [place]: LUMBAR MYELOGRAM FINDINGS:  Lumbar vertebral alignment is within normal limits without evidence of listhesis on upright neutral, flexion, or extension imaging. There are moderate-sized ventral extradural defects at L3-4 and L4-5, slightly larger at L4-5 and resulting in likely moderate spinal stenosis at both levels. Small ventral extradural  defects are present at L1-2 and L2-3.  CT LUMBAR MYELOGRAM FINDINGS:  There is trace retrolisthesis of L3 on L4 of 2 mm, unchanged. Vertebral body heights are preserved. Mild disc space narrowing is present from L2-3 to L5-S1. Vacuum disc is noted at L4-5. 3 mm sclerotic focus in the L3 vertebral body likely represents a bone island. Conus medullaris terminates at L1. There is mild redundancy of the nerve roots of the cauda equina in the mid lumbar spine due to spinal stenosis. 1.2 cm ilpomatous lesion in the medial interpolar right kidney likely represents an angiomyolipoma. Mild atherosclerotic aortic calcification is noted.  L1-2:  Mild disc bulging without stenosis, unchanged.  L2-3:  Mild disc bulging without significant stenosis, unchanged.  L3-4: Moderate circumferential disc bulging and facet and ligamentum flavum hypertrophy result in moderate spinal stenosis and mild bilateral neural foraminal  stenosis, unchanged.  L4-5: Prominent circumferential disc bulging and facet and ligamentum flavum hypertrophy result in moderate spinal stenosis and moderate bilateral neural foraminal stenosis, unchanged. Lateral recess narrowing could potentially effect either L5 nerve root.  L5-S1: Mild disc bulging slightly asymmetric to the right results in mild right lateral recess narrowing with slight posterior displacement of the right S1 nerve root in the lateral recess. There is mild right neural foraminal narrowing without spinal stenosis.  IMPRESSION: 1. Mild-to-moderate multilevel disc and facet degeneration, worst at L4-5 where there is moderate spinal stenosis and moderate bilateral neural foraminal stenosis. 2. Moderate spinal stenosis at L3-4. 3. Mild right lateral recess and right neural foraminal narrowing at L5-S1.  Electronically Signed: By: Logan Bores On: 07/21/2014 11:20   Dg Spine Portable 1 View  08/09/2014   CLINICAL DATA:  Lumbar decompression at L3-4 and L4-5  EXAM: PORTABLE SPINE - 1 VIEW   COMPARISON:  Lumbar spine film of 08/03/2014  FINDINGS: Cross-table lateral view of the lumbar spine labeled 3 shows 1 instrument directed cephalad toward the posterior body of L3 with the second instrument directed caudally toward the posterior body of L5.  IMPRESSION: Instruments for localization as noted above.   Electronically Signed   By: Ivar Drape M.D.   On: 08/09/2014 11:57   Dg Spine Portable 1 View  08/09/2014   CLINICAL DATA:  L3-4 and L4-5 decompression.  Subsequent encounter.  EXAM: PORTABLE SPINE - 1 VIEW  COMPARISON:  08/03/2014.  FINDINGS: 0937 hrs. Same numbering scheme used today as on the previous exam. Soft tissue retractors are seen in the lower back. The tip of the caudal most probe is overlying a positioned just posterior to the L5 pedicle. The middle probe tip overlies a position just posterior to the L4 pedicle. A third surgical instrument position most cranially has the tip overlying a space just posterior to the L3 pedicle.  IMPRESSION: Interoperative localization.   Electronically Signed   By: Misty Stanley M.D.   On: 08/09/2014 10:19   Dg Spine Portable 1 View  08/09/2014   CLINICAL DATA:  Lumbar decompression.  EXAM: PORTABLE SPINE - 1 VIEW  COMPARISON:  08/03/2014 .  FINDINGS: Lumbar vertebra are numbered as per prior exam. Metallic markers are noted at the L3 and L5 levels posteriorly.  IMPRESSION: Surgical metallic markers are noted at the L3 and L5 levels posteriorly .   Electronically Signed   By: Marcello Moores  Register   On: 08/09/2014 09:52   Dg Myelography Lumbar Inj Lumbosacral  08/07/2014   ADDENDUM REPORT: 08/07/2014 08:02  ADDENDUM: Spinal stenosis at L3-4 increases with upright extension, where it appears severe.  Study was reviewed by telephone with Dr. Tonita Cong on 08/07/2014 at 8:00 am.   Electronically Signed   By: Logan Bores   On: 08/07/2014 08:02   08/07/2014   CLINICAL DATA:  Lumbar spinal stenosis. Right foot pain. Some low back and right lower extremity pain.  Prior lumbar surgery.  EXAM: LUMBAR MYELOGRAM  FLUOROSCOPY TIME:  Fluoroscopy Time (in minutes and seconds): 0 minutes 56 seconds  Number of Acquired Images:  12  PROCEDURE: After thorough discussion of risks and benefits of the procedure including bleeding, infection, injury to nerves, blood vessels, adjacent structures as well as headache and CSF leak, written and oral informed consent was obtained. Consent was obtained by Dr. Logan Bores. Time out form was completed.  Patient was positioned prone on the fluoroscopy table. Local anesthesia was provided with 1% lidocaine without  epinephrine after prepped and draped in the usual sterile fashion. Puncture was performed at L5-S1 using a 3 1/2 inch 22-gauge spinal needle via a left paramedian approach. Using a single pass through the dura, the needle was placed within the thecal sac, with return of clear CSF. 15 mL of Omnipaque-180 was injected into the thecal sac, with normal opacification of the nerve roots and cauda equina consistent with free flow within the subarachnoid space.  I personally performed the lumbar puncture and administered the intrathecal contrast. I also personally supervised acquisition of the myelogram images.  TECHNIQUE: Contiguous axial images were obtained through the Lumbar spine after the intrathecal infusion of contrast. Coronal and sagittal reconstructions were obtained of the axial image sets.  COMPARISON:  Lumbar spine MRI 06/19/2014 from [place]: LUMBAR MYELOGRAM FINDINGS:  Lumbar vertebral alignment is within normal limits without evidence of listhesis on upright neutral, flexion, or extension imaging. There are moderate-sized ventral extradural defects at L3-4 and L4-5, slightly larger at L4-5 and resulting in likely moderate spinal stenosis at both levels. Small ventral extradural defects are present at L1-2 and L2-3.  CT LUMBAR MYELOGRAM FINDINGS:  There is trace retrolisthesis of L3 on L4 of 2 mm,  unchanged. Vertebral body heights are preserved. Mild disc space narrowing is present from L2-3 to L5-S1. Vacuum disc is noted at L4-5. 3 mm sclerotic focus in the L3 vertebral body likely represents a bone island. Conus medullaris terminates at L1. There is mild redundancy of the nerve roots of the cauda equina in the mid lumbar spine due to spinal stenosis. 1.2 cm ilpomatous lesion in the medial interpolar right kidney likely represents an angiomyolipoma. Mild atherosclerotic aortic calcification is noted.  L1-2:  Mild disc bulging without stenosis, unchanged.  L2-3:  Mild disc bulging without significant stenosis, unchanged.  L3-4: Moderate circumferential disc bulging and facet and ligamentum flavum hypertrophy result in moderate spinal stenosis and mild bilateral neural foraminal stenosis, unchanged.  L4-5: Prominent circumferential disc bulging and facet and ligamentum flavum hypertrophy result in moderate spinal stenosis and moderate bilateral neural foraminal stenosis, unchanged. Lateral recess narrowing could potentially effect either L5 nerve root.  L5-S1: Mild disc bulging slightly asymmetric to the right results in mild right lateral recess narrowing with slight posterior displacement of the right S1 nerve root in the lateral recess. There is mild right neural foraminal narrowing without spinal stenosis.  IMPRESSION: 1. Mild-to-moderate multilevel disc and facet degeneration, worst at L4-5 where there is moderate spinal stenosis and moderate bilateral neural foraminal stenosis. 2. Moderate spinal stenosis at L3-4. 3. Mild right lateral recess and right neural foraminal narrowing at L5-S1.  Electronically Signed: By: Logan Bores On: 07/21/2014 11:20    EKG: Orders placed or performed in visit on 12/21/13  . EKG 12-Lead     Hospital Course: Patient was admitted to [facility] and taken to the OR and underwent the above state procedure without complications.  Patient tolerated the  procedure well and was later transferred to the recovery room and then to the orthopaedic floor for postoperative care.  They were given PO and IV analgesics for pain control following their surgery.  They were given 24 hours of postoperative antibiotics.   PT was consulted postop to assist with mobility and transfers.  The patient was allowed to be WBAT with therapy and was taught back precautions. Discharge planning was consulted to help with postop disposition and equipment needs.  Patient had a fair night on the  evening of surgery and started to get up OOB with therapy on day one. Patient was seen in rounds and was ready to go home on day one.  They were given discharge instructions and dressing directions.  They were instructed on when to follow up in the office with Dr. Tonita Cong.   Diet: Regular diet Activity:WBAT; Lspine precautions Follow-up:in 10 days Disposition - Home Discharged Condition: good   Discharge Instructions    Call MD / Call 911    Complete by:  As directed   If you experience chest pain or shortness of breath, CALL 911 and be transported to the hospital emergency room.  If you develope a fever above 101 F, pus (white drainage) or increased drainage or redness at the wound, or calf pain, call your surgeon's office.     Constipation Prevention    Complete by:  As directed   Drink plenty of fluids.  Prune juice may be helpful.  You may use a stool softener, such as Colace (over the counter) 100 mg twice a day.  Use MiraLax (over the counter) for constipation as needed.     Diet - low sodium heart healthy    Complete by:  As directed      Increase activity slowly as tolerated    Complete by:  As directed             Medication List    STOP taking these medications        FLAXSEED OIL PO     L-Arginine 1000 MG Tabs      TAKE these medications        Biotin 1 MG Caps  Take 1 capsule by mouth daily.     calcium-vitamin D 500-200 MG-UNIT per tablet  Take 1 tablet  by mouth daily. Calcium magnesium     cholecalciferol 1000 UNITS tablet  Commonly known as:  VITAMIN D  Take 3,000 Units by mouth daily.     docusate sodium 100 MG capsule  Commonly known as:  COLACE  Take 1 capsule (100 mg total) by mouth 2 (two) times daily as needed for mild constipation.     estradiol 0.0375 MG/24HR  Commonly known as:  [place] 1 patch onto the skin 2 (two) times a week.     FINACEA 15 % cream  Generic drug:  Azelaic Acid  Apply 1 application topically daily.     glucosamine-chondroitin 500-400 MG tablet  Take 1 tablet by mouth 3 (three) times daily.     HYDROcodone-acetaminophen 7.5-325 MG per tablet  Commonly known as:  NORCO  Take 1 tablet by mouth every 6 (six) hours as needed for moderate pain.     ibuprofen 800 MG tablet  Commonly known as:  ADVIL,MOTRIN  Take 1 tablet (800 mg total) by mouth every 8 (eight) hours as needed for mild pain. Resume 5 days post-op     methocarbamol 500 MG tablet  Commonly known as:  ROBAXIN  Take 1 tablet (500 mg total) by mouth 3 (three) times daily.     metroNIDAZOLE 1 % gel  Commonly known as:  METROGEL  Apply 1 application topically daily.     nortriptyline 10 MG/5ML solution  Commonly known as:  PAMELOR  Take 5 mLs (10 mg total) by mouth at bedtime.     omeprazole 40 MG capsule  Commonly known as:  PRILOSEC  Take 40 mg by mouth daily.     oxyCODONE-acetaminophen 5-325 MG per tablet  Commonly  known as:  PERCOCET  Take 1 tablet by mouth every 4 (four) hours as needed.     promethazine 12.5 MG tablet  Commonly known as:  PHENERGAN  Take 1 tablet (12.5 mg total) by mouth every 6 (six) hours as needed for nausea or vomiting.     pyridoxine 500 MG tablet  Commonly known as:  B-6  Take 500 mg by mouth daily.     vitamin B-12 1000 MCG tablet  Commonly known as:  CYANOCOBALAMIN  Take 1,000 mcg by mouth daily.     vitamin C 500 MG tablet  Commonly known as:  ASCORBIC ACID  Take 500 mg by mouth  daily.           Follow-up Information    Follow up with BEANE,JEFFREY C, MD In 2 weeks.   Specialty:  Orthopedic Surgery   Why:  For suture removal   Contact information:   [address] [place] [address] [phone]       Follow up with Johnn Hai, MD In 2 weeks.   Specialty:  Orthopedic Surgery   Contact information:   [address] [place] [address] [phone]       Signed: Lacie Draft, PA-C Orthopaedic Surgery 08/11/2014, 7:59 AM

## 2014-08-11 NOTE — Progress Notes (Signed)
Subjective: 2 Days Post-Op Procedure(s) (LRB): MICRO LUMBAR DECOMPRESSION L3-4,  REDO DECOMPRESSION, MICRODISCECTOMY L4-5, FORAMINOTOMY L3-L4, L4-L5 BILATERAL (N/A) Patient reports pain as moderate.  Reports some increased leg pain yesterday later in the day. It did seem to help to walk, after she got moving. She does note the R great toe feels more normal and she has less pain over the dorsal foot. Noting some slight pain in the left lateral hip today not radiating down the leg. Voiding without difficulty. No BM yet but + flatus.  Objective: Vital signs in last 24 hours: Temp:  [97.5 F (36.4 C)-98.7 F (37.1 C)] 98.7 F (37.1 C) (06/24 0507) Pulse Rate:  [70-88] 88 (06/24 0507) Resp:  [16] 16 (06/24 0507) BP: (96-116)/(41-63) 101/54 mmHg (06/24 0507) SpO2:  [95 %-100 %] 95 % (06/24 0507)  Intake/Output from previous day: 06/23 0701 - 06/24 0700 In: 1215 [P.O.:840; I.V.:325; IV Piggyback:50] Out: 700 [Urine:700] Intake/Output this shift:    No results for input(s): HGB in the last 72 hours. No results for input(s): WBC, RBC, HCT, PLT in the last 72 hours. No results for input(s): NA, K, CL, CO2, BUN, CREATININE, GLUCOSE, CALCIUM in the last 72 hours. No results for input(s): LABPT, INR in the last 72 hours.  Neurologically intact ABD soft Neurovascular intact Sensation intact distally Intact pulses distally Dorsiflexion/Plantar flexion intact Incision: dressing C/D/I and no drainage No cellulitis present Compartment soft no sign of DVT  Assessment/Plan: 2 Days Post-Op Procedure(s) (LRB): MICRO LUMBAR DECOMPRESSION L3-4,  REDO DECOMPRESSION, MICRODISCECTOMY L4-5, FORAMINOTOMY L3-L4, L4-L5 BILATERAL (N/A) Advance diet Up with therapy D/C IV fluids  Discussed D/C instructions, dressing instructions, Lspine precautions Encouraged walking Discussed D/C medications, she does have Norco at home, can alternate Norco and Percocet prn for pain depending on severity, discussed  precautions with Tylenol Will discuss with Dr. Tonita Cong Plan D/C later today  Lacie Draft M. 08/11/2014, 7:53 AM

## 2014-08-11 NOTE — Plan of Care (Signed)
Problem: Discharge Progression Outcomes Goal: Incision without S/S infection Outcome: Completed/Met Date Met:  08/11/14 No drainage through dressing; aquacell dressing in place so incision not visible at this time. No symptoms of infection.

## 2014-11-30 ENCOUNTER — Encounter: Payer: Self-pay | Admitting: Obstetrics & Gynecology

## 2014-12-01 ENCOUNTER — Telehealth: Payer: Self-pay | Admitting: Emergency Medicine

## 2014-12-01 NOTE — Telephone Encounter (Signed)
Routing mychart message to Dr. Miller for review and advice.   

## 2014-12-01 NOTE — Telephone Encounter (Signed)
Yes she can use both.  Can send in rx for Estrace 1gm pv twice weekly or premarin 1/2 gm pv twice weekly if she needs rx sent.  I'm not sure where she is getting the vaginal valium but [facility] dose compound this.  Not sure if she is getting compounded version or not but that may make a difference with the vaginal valium.

## 2014-12-01 NOTE — Telephone Encounter (Signed)
Message left to return call to Pressley Barsky at [phone].    

## 2014-12-01 NOTE — Telephone Encounter (Signed)
Telephone call for triage created to discuss message with patient and disposition as appropriate.   

## 2014-12-01 NOTE — Telephone Encounter (Signed)
Chief Complaint  Patient presents with  . Advice Only    Patient sent mychart message     ===View-only below this line===   ----- Message -----    From: Tedra Senegal    Sent: 11/30/2014  7:49 AM EDT      To: Lyman Speller, MD Subject: Non-Urgent Medical Question  Dr. Sabra Heck,  I have a new urologist who is treating me for IC symptoms. I am currently trying to use "vaginal valium," but the tablet is not  dissolving properly, so he is suggesting using estrogen cream. Is it okay to use the cream, as well as, using the Minivelle patch? I am also working with a PT at his office for pelvic floor issues, as well.  Thank you, [facility]

## 2014-12-04 NOTE — Telephone Encounter (Signed)
Returning call.

## 2014-12-04 NOTE — Telephone Encounter (Signed)
Spoke with patient. She is given message from Dr. Sabra Heck. She is very happy to hear that she can do both vaginal estrogen and her Minivelle patch. Patient states she will discuss the vaginal valium compounded with her Urologist and if necessary will call back if needs anything further from Dr. Sabra Heck.  Routing to provider for final review. Patient agreeable to disposition. Will close encounter.

## 2014-12-25 ENCOUNTER — Encounter: Payer: Self-pay | Admitting: Physician Assistant

## 2014-12-25 ENCOUNTER — Ambulatory Visit (INDEPENDENT_AMBULATORY_CARE_PROVIDER_SITE_OTHER): Payer: 59 | Admitting: Physician Assistant

## 2014-12-25 VITALS — BP 110/70 | HR 66 | Temp 97.9°F | Resp 14 | Ht 59.5 in | Wt 116.0 lb

## 2014-12-25 DIAGNOSIS — K219 Gastro-esophageal reflux disease without esophagitis: Secondary | ICD-10-CM

## 2014-12-25 DIAGNOSIS — Z0000 Encounter for general adult medical examination without abnormal findings: Secondary | ICD-10-CM

## 2014-12-25 DIAGNOSIS — H9193 Unspecified hearing loss, bilateral: Secondary | ICD-10-CM

## 2014-12-25 DIAGNOSIS — Z0001 Encounter for general adult medical examination with abnormal findings: Secondary | ICD-10-CM

## 2014-12-25 DIAGNOSIS — R7303 Prediabetes: Secondary | ICD-10-CM

## 2014-12-25 DIAGNOSIS — R5383 Other fatigue: Secondary | ICD-10-CM

## 2014-12-25 DIAGNOSIS — E785 Hyperlipidemia, unspecified: Secondary | ICD-10-CM

## 2014-12-25 DIAGNOSIS — N3010 Interstitial cystitis (chronic) without hematuria: Secondary | ICD-10-CM

## 2014-12-25 DIAGNOSIS — Z79899 Other long term (current) drug therapy: Secondary | ICD-10-CM

## 2014-12-25 DIAGNOSIS — I10 Essential (primary) hypertension: Secondary | ICD-10-CM

## 2014-12-25 DIAGNOSIS — Z1159 Encounter for screening for other viral diseases: Secondary | ICD-10-CM

## 2014-12-25 DIAGNOSIS — E559 Vitamin D deficiency, unspecified: Secondary | ICD-10-CM

## 2014-12-25 DIAGNOSIS — M48061 Spinal stenosis, lumbar region without neurogenic claudication: Secondary | ICD-10-CM

## 2014-12-25 HISTORY — DX: Unspecified hearing loss, bilateral: H91.93

## 2014-12-25 LAB — IRON AND TIBC
%SAT: 22 % (ref 11–50)
Iron: 63 ug/dL (ref 45–160)
TIBC: 283 ug/dL (ref 250–450)
UIBC: 220 ug/dL (ref 125–400)

## 2014-12-25 LAB — CBC WITH DIFFERENTIAL/PLATELET
Basophils Absolute: 0 10*3/uL (ref 0.0–0.1)
Basophils Relative: 0 % (ref 0–1)
Eosinophils Absolute: 0.1 10*3/uL (ref 0.0–0.7)
Eosinophils Relative: 2 % (ref 0–5)
HCT: 38.1 % (ref 36.0–46.0)
Hemoglobin: 12.5 g/dL (ref 12.0–15.0)
Lymphocytes Relative: 22 % (ref 12–46)
Lymphs Abs: 1.6 10*3/uL (ref 0.7–4.0)
MCH: 31.6 pg (ref 26.0–34.0)
MCHC: 32.8 g/dL (ref 30.0–36.0)
MCV: 96.5 fL (ref 78.0–100.0)
MPV: 11.1 fL (ref 8.6–12.4)
Monocytes Absolute: 0.5 10*3/uL (ref 0.1–1.0)
Monocytes Relative: 7 % (ref 3–12)
Neutro Abs: 5.1 10*3/uL (ref 1.7–7.7)
Neutrophils Relative %: 69 % (ref 43–77)
Platelets: 268 10*3/uL (ref 150–400)
RBC: 3.95 MIL/uL (ref 3.87–5.11)
RDW: 13.1 % (ref 11.5–15.5)
WBC: 7.4 10*3/uL (ref 4.0–10.5)

## 2014-12-25 LAB — MAGNESIUM: Magnesium: 1.9 mg/dL (ref 1.5–2.5)

## 2014-12-25 LAB — LIPID PANEL
Cholesterol: 199 mg/dL (ref 125–200)
HDL: 91 mg/dL (ref >=46)
LDL Cholesterol: 97 mg/dL (ref <130)
Total CHOL/HDL Ratio: 2.2 Ratio (ref <=5.0)
Triglycerides: 57 mg/dL (ref <150)
VLDL: 11 mg/dL (ref <30)

## 2014-12-25 LAB — VITAMIN B12: Vitamin B-12: 1089 pg/mL — ABNORMAL HIGH (ref 211–911)

## 2014-12-25 LAB — BASIC METABOLIC PANEL WITH GFR
BUN: 18 mg/dL (ref 7–25)
CO2: 24 mmol/L (ref 20–31)
Calcium: 8.6 mg/dL (ref 8.6–10.4)
Chloride: 103 mmol/L (ref 98–110)
Creat: 0.56 mg/dL (ref 0.50–0.99)
GFR, Est African American: >89 mL/min (ref >=60)
GFR, Est Non African American: >89 mL/min (ref >=60)
Glucose, Bld: 92 mg/dL (ref 65–99)
Potassium: 4.1 mmol/L (ref 3.5–5.3)
Sodium: 140 mmol/L (ref 135–146)

## 2014-12-25 LAB — TSH: TSH: 1.548 u[IU]/mL (ref 0.350–4.500)

## 2014-12-25 LAB — HEPATIC FUNCTION PANEL
ALT: 14 U/L (ref 6–29)
AST: 19 U/L (ref 10–35)
Albumin: 4 g/dL (ref 3.6–5.1)
Alkaline Phosphatase: 69 U/L (ref 33–130)
Bilirubin, Direct: 0.1 mg/dL (ref <=0.2)
Indirect Bilirubin: 0.3 mg/dL (ref 0.2–1.2)
Total Bilirubin: 0.4 mg/dL (ref 0.2–1.2)
Total Protein: 6.5 g/dL (ref 6.1–8.1)

## 2014-12-25 LAB — FERRITIN: Ferritin: 44 ng/mL (ref 10–291)

## 2014-12-25 LAB — HEMOGLOBIN A1C
Hgb A1c MFr Bld: 5.5 % (ref <5.7)
Mean Plasma Glucose: 111 mg/dL (ref <117)

## 2014-12-25 MED ORDER — AZITHROMYCIN 250 MG PO TABS: ORAL_TABLET | ORAL | Status: AC

## 2014-12-25 NOTE — Patient Instructions (Addendum)
Please pick one of the over the counter allergy medications below and take it once daily for allergies.  Claritin or loratadine cheapest but likely the weakest  Zyrtec or certizine at night because it can make you sleepy The strongest is allegra or fexafinadine  Cheapest at [facility], [facility], [facility]  Add ENTERIC COATED low dose 81 mg Aspirin daily OR can do every other day if you have easy bruising to protect your heart and head. As well as to reduce risk of Colon Cancer by 20 %, Skin Cancer by 26 % , Melanoma by 46% and Pancreatic cancer by 60%   Sinusitis can be uncomfortable. People with sinusitis have congestion with yellow/green/gray discharge, sinus pain/pressure, pain around the eyes. Sinus infections almost ALWAYS stem from a viral infection and antibiotics don't work against a virus. Even when bacteria is responsible, the infections usually clear up on their own in a week or so.   PLEASE TRY TO DO OVER THE COUNTER TREATMENT AND PREDNISONE FOR 5-7 DAYS AND IF YOU ARE NOT GETTING BETTER OR GETTING WORSE THEN YOU CAN START ON AN ANTIBIOTIC GIVEN.  Can take the prednisone AT NIGHT WITH DINNER, it take 8-12 hours to start working so it will NOT affect your sleeping if you take it at night with your food!! Take two pills the first night and 1 or two pill the second night and then 1 pill the other nights.   Risk of antibiotic use: About 1 in 4 people who take antibiotics have side effects including stomach problems, dizziness, or rashes. Those problems clear up soon after stopping the drugs, but in rare cases antibiotics can cause severe allergic reaction. Over use of antibiotics also encourages the growth of bacteria that can't be controlled easily with drugs. That makes you more vunerable to antibiotic-resistant infections and undermines the benefits of antibiotics for others.   Waste of Money: Antibiotics often aren't very expensive, but any money spent on unnecessary drugs is money down the  drain.   When are antibiotics needed? Only when symptoms last longer than a week.  Start to improve but then worsen again  -It can take up to 2 weeks to feel better.   -If you do not get better in 7-10 days (Have fever, facial pain, dental pain and swelling), then please call the office and it is now appropriate to start an antibiotic.   -Please take Tylenol or Ibuprofen for pain. -Acetaminiphen 325mg  orally every 4-6 hours for pain.  Max: 10 per day -Ibuprofen 200mg  orally every 6-8 hours for pain.  Take with food to avoid ulcers.   Max 10 per day  Please pick one of the over the counter allergy medications below and take it once daily for allergies.  Claritin or loratadine cheapest but likely the weakest  Zyrtec or certizine at night because it can make you sleepy The strongest is allegra or fexafinadine  Cheapest at [facility], [employer], [facility]  -While drinking fluids, pinch and hold nose close and swallow.  This will help open up your eustachian tubes to drain the fluid behind your ear drums. -Try steam showers to open your nasal passages.   Drink lots of water to stay hydrated and to thin mucous.  Flonase/Nasonex is to help the inflammation.  Take 2 sprays in each nostril at bedtime.  Make sure you spray towards the outside of each nostril towards the outer corner of your eye, hold nose close and tilt head back.  This will help the medication get  into your sinuses.  If you do not like this medication, then use saline nasal sprays same directions as above for Flonase. Stop the medication right away if you get blurring of your vision or nose bleeds.  Sinusitis Sinusitis is redness, soreness, and inflammation of the paranasal sinuses. Paranasal sinuses are air pockets within the bones of your face (beneath the eyes, the middle of the forehead, or above the eyes). In healthy paranasal sinuses, mucus is able to drain out, and air is able to circulate through them by way of your nose. However,  when your paranasal sinuses are inflamed, mucus and air can become trapped. This can allow bacteria and other germs to grow and cause infection. Sinusitis can develop quickly and last only a short time (acute) or continue over a long period (chronic). Sinusitis that lasts for more than 12 weeks is considered chronic.  CAUSES  Causes of sinusitis include: Allergies. Structural abnormalities, such as displacement of the cartilage that separates your nostrils (deviated septum), which can decrease the air flow through your nose and sinuses and affect sinus drainage. Functional abnormalities, such as when the small hairs (cilia) that line your sinuses and help remove mucus do not work properly or are not present. SIGNS AND SYMPTOMS  Symptoms of acute and chronic sinusitis are the same. The primary symptoms are pain and pressure around the affected sinuses. Other symptoms include: Upper toothache. Earache. Headache. Bad breath. Decreased sense of smell and taste. A cough, which worsens when you are lying flat. Fatigue. Fever. Thick drainage from your nose, which often is green and may contain pus (purulent). Swelling and warmth over the affected sinuses. DIAGNOSIS  Your health care provider will perform a physical exam. During the exam, your health care provider may: Look in your nose for signs of abnormal growths in your nostrils (nasal polyps).  Tap over the affected sinus to check for signs of infection. View the inside of your sinuses (endoscopy) using an imaging device that has a light attached (endoscope). If your health care provider suspects that you have chronic sinusitis, one or more of the following tests may be recommended: Allergy tests. Nasal culture. A sample of mucus is taken from your nose, sent to a lab, and screened for bacteria. Nasal cytology. A sample of mucus is taken from your nose and examined by your health care provider to determine if your sinusitis is related to an  allergy. TREATMENT  Most cases of acute sinusitis are related to a viral infection and will resolve on their own within 10 days. Sometimes medicines are prescribed to help relieve symptoms (pain medicine, decongestants, nasal steroid sprays, or saline sprays).  However, for sinusitis related to a bacterial infection, your health care provider will prescribe antibiotic medicines. These are medicines that will help kill the bacteria causing the infection.  Rarely, sinusitis is caused by a fungal infection. In theses cases, your health care provider will prescribe antifungal medicine. For some cases of chronic sinusitis, surgery is needed. Generally, these are cases in which sinusitis recurs more than 3 times per year, despite other treatments. HOME CARE INSTRUCTIONS  Drink plenty of water. Water helps thin the mucus so your sinuses can drain more easily. Use a humidifier. Inhale steam 3 to 4 times a day (for example, sit in the bathroom with the shower running). Apply a warm, moist washcloth to your face 3 to 4 times a day, or as directed by your health care provider. Use saline nasal sprays to help moisten  and clean your sinuses. Take medicines only as directed by your health care provider. If you were prescribed either an antibiotic or antifungal medicine, finish it all even if you start to feel better. SEEK IMMEDIATE MEDICAL CARE IF: You have increasing pain or severe headaches. You have nausea, vomiting, or drowsiness. You have swelling around your face. You have vision problems. You have a stiff neck. You have difficulty breathing. MAKE SURE YOU:  Understand these instructions. Will watch your condition. Will get help right away if you are not doing well or get worse. Document Released: 02/03/2005 Document Revised: 06/20/2013 Document Reviewed: 02/18/2011 [facility] Patient Information 2015 [place], [place]. This information is not intended to replace advice given to you by your health care  provider. Make sure you discuss any questions you have with your health care provider.  GETTING OFF OF PPI's    Nexium/protonix/prilosec/Omeprazole/Dexilant/Aciphex are called PPI's, they are great at healing your stomach but should only be taken for a short period of time.     Recent studies have shown that taken for a long time they  can increase the risk of osteoporosis (weakening of your bones), pneumonia, low magnesium, restless legs, Cdiff (infection that causes diarrhea), DEMENTIA and most recently kidney damage / disease / insufficiency.     Due to this information we want to try to stop the PPI but if you try to stop it abruptly this can cause rebound acid and worsening symptoms.   So this is how we want you to get off the PPI:  - Start taking the nexium/protonix/prilosec/PPI  every other day with  zantac (ranitidine) 2 x a day for 2-4 weeks  - then decrease the PPI to every 3 days while taking the zantac (ranitidine) twice a day the other  days for 2-4  Weeks  - then you can try the zantac (ranitidine) once at night or up to 2 x day as needed.  - you can continue on this once at night or stop all together  - Avoid alcohol, spicy foods, NSAIDS (aleve, ibuprofen) at this time. See foods below.   +++++++++++++++++++++++++++++++++++++++++++  Food Choices for Gastroesophageal Reflux Disease  When you have gastroesophageal reflux disease (GERD), the foods you eat and your eating habits are very important. Choosing the right foods can help ease the discomfort of GERD. WHAT GENERAL GUIDELINES DO I NEED TO FOLLOW?  Choose fruits, vegetables, whole grains, low-fat dairy products, and low-fat meat, fish, and poultry.  Limit fats such as oils, salad dressings, butter, nuts, and avocado.  Keep a food diary to identify foods that cause symptoms.  Avoid foods that cause reflux. These may be different for different people.  Eat frequent small meals instead of three large meals each  day.  Eat your meals slowly, in a relaxed setting.  Limit fried foods.  Cook foods using methods other than frying.  Avoid drinking alcohol.  Avoid drinking large amounts of liquids with your meals.  Avoid bending over or lying down until 2-3 hours after eating.   WHAT FOODS ARE NOT RECOMMENDED? The following are some foods and drinks that may worsen your symptoms:  Vegetables Tomatoes. Tomato juice. Tomato and spaghetti sauce. Chili peppers. Onion and garlic. Horseradish. Fruits Oranges, grapefruit, and lemon (fruit and juice). Meats High-fat meats, fish, and poultry. This includes hot dogs, ribs, ham, sausage, salami, and bacon. Dairy Whole milk and chocolate milk. Sour cream. Cream. Butter. Ice cream. Cream cheese.  Beverages Coffee and tea, with or without caffeine. Carbonated  beverages or energy drinks. Condiments Hot sauce. Barbecue sauce.  Sweets/Desserts Chocolate and cocoa. Donuts. Peppermint and spearmint. Fats and Oils High-fat foods, including [place] fries and potato chips. Other Vinegar. Strong spices, such as black pepper, white pepper, red pepper, cayenne, curry powder, cloves, ginger, and chili powder. Nexium/protonix/prilosec are called PPI's, they are great at healing your stomach but should only be taken for a short period of time.   Preventive Care for Adults  A healthy lifestyle and preventive care can promote health and wellness. Preventive health guidelines for women include the following key practices.  A routine yearly physical is a good way to check with your health care provider about your health and preventive screening. It is a chance to share any concerns and updates on your health and to receive a thorough exam.  Visit your dentist for a routine exam and preventive care every 6 months. Brush your teeth twice a day and floss once a day. Good oral hygiene prevents tooth decay and gum disease.  The frequency of eye exams is based on your age,  health, family medical history, use of contact lenses, and other factors. Follow your health care provider's recommendations for frequency of eye exams.  Eat a healthy diet. Foods like vegetables, fruits, whole grains, low-fat dairy products, and lean protein foods contain the nutrients you need without too many calories. Decrease your intake of foods high in solid fats, added sugars, and salt. Eat the right amount of calories for you.Get information about a proper diet from your health care provider, if necessary.  Regular physical exercise is one of the most important things you can do for your health. Most adults should get at least 150 minutes of moderate-intensity exercise (any activity that increases your heart rate and causes you to sweat) each week. In addition, most adults need muscle-strengthening exercises on 2 or more days a week.  Maintain a healthy weight. The body mass index (BMI) is a screening tool to identify possible weight problems. It provides an estimate of body fat based on height and weight. Your health care provider can find your BMI and can help you achieve or maintain a healthy weight.For adults 20 years and older:  A BMI below 18.5 is considered underweight.  A BMI of 18.5 to 24.9 is normal.  A BMI of 25 to 29.9 is considered overweight.  A BMI of 30 and above is considered obese.  Maintain normal blood lipids and cholesterol levels by exercising and minimizing your intake of saturated fat. Eat a balanced diet with plenty of fruit and vegetables. Blood tests for lipids and cholesterol should begin at age 83 and be repeated every 5 years. If your lipid or cholesterol levels are high, you are over 50, or you are at high risk for heart disease, you may need your cholesterol levels checked more frequently.Ongoing high lipid and cholesterol levels should be treated with medicines if diet and exercise are not working.  If you smoke, find out from your health care provider  how to quit. If you do not use tobacco, do not start.  Lung cancer screening is recommended for adults aged 23-80 years who are at high risk for developing lung cancer because of a history of smoking. A yearly low-dose CT scan of the lungs is recommended for people who have at least a 30-pack-year history of smoking and are a current smoker or have quit within the past 15 years. A pack year of smoking is smoking an average of  1 pack of cigarettes a day for 1 year (for example: 1 pack a day for 30 years or 2 packs a day for 15 years). Yearly screening should continue until the smoker has stopped smoking for at least 15 years. Yearly screening should be stopped for people who develop a health problem that would prevent them from having lung cancer treatment.  High blood pressure causes heart disease and increases the risk of stroke. Your blood pressure should be checked at least every 1 to 2 years. Ongoing high blood pressure should be treated with medicines if weight loss and exercise do not work.  If you are 53-23 years old, ask your health care provider if you should take aspirin to prevent strokes.  Diabetes screening involves taking a blood sample to check your fasting blood sugar level. This should be done once every 3 years, after age 57, if you are within normal weight and without risk factors for diabetes. Testing should be considered at a younger age or be carried out more frequently if you are overweight and have at least 1 risk factor for diabetes.  Breast cancer screening is essential preventive care for women. You should practice "breast self-awareness." This means understanding the normal appearance and feel of your breasts and may include breast self-examination. Any changes detected, no matter how small, should be reported to a health care provider. Women in their 73s and 30s should have a clinical breast exam (CBE) by a health care provider as part of a regular health exam every 1 to 3  years. After age 8, women should have a CBE every year. Starting at age 40, women should consider having a mammogram (breast X-ray test) every year. Women who have a family history of breast cancer should talk to their health care provider about genetic screening. Women at a high risk of breast cancer should talk to their health care providers about having an MRI and a mammogram every year.  Breast cancer gene (BRCA)-related cancer risk assessment is recommended for women who have family members with BRCA-related cancers. BRCA-related cancers include breast, ovarian, tubal, and peritoneal cancers. Having family members with these cancers may be associated with an increased risk for harmful changes (mutations) in the breast cancer genes BRCA1 and BRCA2. Results of the assessment will determine the need for genetic counseling and BRCA1 and BRCA2 testing.  Routine pelvic exams to screen for cancer are no longer recommended for nonpregnant women who are considered low risk for cancer of the pelvic organs (ovaries, uterus, and vagina) and who do not have symptoms. Ask your health care provider if a screening pelvic exam is right for you.  If you have had past treatment for cervical cancer or a condition that could lead to cancer, you need Pap tests and screening for cancer for at least 20 years after your treatment. If Pap tests have been discontinued, your risk factors (such as having a new sexual partner) need to be reassessed to determine if screening should be resumed. Some women have medical problems that increase the chance of getting cervical cancer. In these cases, your health care provider may recommend more frequent screening and Pap tests.  Colorectal cancer can be detected and often prevented. Most routine colorectal cancer screening begins at the age of 87 years and continues through age 2 years. However, your health care provider may recommend screening at an earlier age if you have risk factors  for colon cancer. On a yearly basis, your health care provider may  provide home test kits to check for hidden blood in the stool. Use of a small camera at the end of a tube, to directly examine the colon (sigmoidoscopy or colonoscopy), can detect the earliest forms of colorectal cancer. Talk to your health care provider about this at age 73, when routine screening begins. Direct exam of the colon should be repeated every 5-10 years through age 72 years, unless early forms of pre-cancerous polyps or small growths are found.  Hepatitis C blood testing is recommended for all people born from [birth year] through 1964 and any individual with known risks for hepatitis C.  Pra  Osteoporosis is a disease in which the bones lose minerals and strength with aging. This can result in serious bone fractures or breaks. The risk of osteoporosis can be identified using a bone density scan. Women ages 70 years and over and women at risk for fractures or osteoporosis should discuss screening with their health care providers. Ask your health care provider whether you should take a calcium supplement or vitamin D to reduce the rate of osteoporosis.  Menopause can be associated with physical symptoms and risks. Hormone replacement therapy is available to decrease symptoms and risks. You should talk to your health care provider about whether hormone replacement therapy is right for you.  Use sunscreen. Apply sunscreen liberally and repeatedly throughout the day. You should seek shade when your shadow is shorter than you. Protect yourself by wearing long sleeves, pants, a wide-brimmed hat, and sunglasses year round, whenever you are outdoors.  Once a month, do a whole body skin exam, using a mirror to look at the skin on your back. Tell your health care provider of new moles, moles that have irregular borders, moles that are larger than a pencil eraser, or moles that have changed in shape or color.  Stay current with required  vaccines (immunizations).  Influenza vaccine. All adults should be immunized every year.  Tetanus, diphtheria, and acellular pertussis (Td, Tdap) vaccine. Pregnant women should receive 1 dose of Tdap vaccine during each pregnancy. The dose should be obtained regardless of the length of time since the last dose. Immunization is preferred during the 27th-36th week of gestation. An adult who has not previously received Tdap or who does not know her vaccine status should receive 1 dose of Tdap. This initial dose should be followed by tetanus and diphtheria toxoids (Td) booster doses every 10 years. Adults with an unknown or incomplete history of completing a 3-dose immunization series with Td-containing vaccines should begin or complete a primary immunization series including a Tdap dose. Adults should receive a Td booster every 10 years.  Varicella vaccine. An adult without evidence of immunity to varicella should receive 2 doses or a second dose if she has previously received 1 dose. Pregnant females who do not have evidence of immunity should receive the first dose after pregnancy. This first dose should be obtained before leaving the health care facility. The second dose should be obtained 4-8 weeks after the first dose.  Human papillomavirus (HPV) vaccine. Females aged 13-26 years who have not received the vaccine previously should obtain the 3-dose series. The vaccine is not recommended for use in pregnant females. However, pregnancy testing is not needed before receiving a dose. If a female is found to be pregnant after receiving a dose, no treatment is needed. In that case, the remaining doses should be delayed until after the pregnancy. Immunization is recommended for any person with an immunocompromised condition through the  age of 29 years if she did not get any or all doses earlier. During the 3-dose series, the second dose should be obtained 4-8 weeks after the first dose. The third dose should be  obtained 24 weeks after the first dose and 16 weeks after the second dose.  Zoster vaccine. One dose is recommended for adults aged 54 years or older unless certain conditions are present.  Measles, mumps, and rubella (MMR) vaccine. Adults born before [birth year] generally are considered immune to measles and mumps. Adults born in [birth year] or later should have 1 or more doses of MMR vaccine unless there is a contraindication to the vaccine or there is laboratory evidence of immunity to each of the three diseases. A routine second dose of MMR vaccine should be obtained at least 28 days after the first dose for students attending postsecondary schools, health care workers, or international travelers. People who received inactivated measles vaccine or an unknown type of measles vaccine during 1963-1967 should receive 2 doses of MMR vaccine. People who received inactivated mumps vaccine or an unknown type of mumps vaccine before 1979 and are at high risk for mumps infection should consider immunization with 2 doses of MMR vaccine. For females of childbearing age, rubella immunity should be determined. If there is no evidence of immunity, females who are not pregnant should be vaccinated. If there is no evidence of immunity, females who are pregnant should delay immunization until after pregnancy. Unvaccinated health care workers born before [birth year] who lack laboratory evidence of measles, mumps, or rubella immunity or laboratory confirmation of disease should consider measles and mumps immunization with 2 doses of MMR vaccine or rubella immunization with 1 dose of MMR vaccine.  Pneumococcal 13-valent conjugate (PCV13) vaccine. When indicated, a person who is uncertain of her immunization history and has no record of immunization should receive the PCV13 vaccine. An adult aged 69 years or older who has certain medical conditions and has not been previously immunized should receive 1 dose of PCV13 vaccine. This PCV13 should be  followed with a dose of pneumococcal polysaccharide (PPSV23) vaccine. The PPSV23 vaccine dose should be obtained at least 8 weeks after the dose of PCV13 vaccine. An adult aged 20 years or older who has certain medical conditions and previously received 1 or more doses of PPSV23 vaccine should receive 1 dose of PCV13. The PCV13 vaccine dose should be obtained 1 or more years after the last PPSV23 vaccine dose.    Pneumococcal polysaccharide (PPSV23) vaccine. When PCV13 is also indicated, PCV13 should be obtained first. All adults aged 78 years and older should be immunized. An adult younger than age 88 years who has certain medical conditions should be immunized. Any person who resides in a nursing home or long-term care facility should be immunized. An adult smoker should be immunized. People with an immunocompromised condition and certain other conditions should receive both PCV13 and PPSV23 vaccines. People with human immunodeficiency virus (HIV) infection should be immunized as soon as possible after diagnosis. Immunization during chemotherapy or radiation therapy should be avoided. Routine use of PPSV23 vaccine is not recommended for American Indians, [place] Natives, or people younger than 65 years unless there are medical conditions that require PPSV23 vaccine. When indicated, people who have unknown immunization and have no record of immunization should receive PPSV23 vaccine. One-time revaccination 5 years after the first dose of PPSV23 is recommended for people aged 19-64 years who have chronic kidney failure, nephrotic syndrome, asplenia, or immunocompromised conditions. People who  received 1-2 doses of PPSV23 before age 73 years should receive another dose of PPSV23 vaccine at age 47 years or later if at least 5 years have passed since the previous dose. Doses of PPSV23 are not needed for people immunized with PPSV23 at or after age 25 years.  Preventive Services / Frequency   Ages 33 to 40  years  Blood pressure check.  Lipid and cholesterol check.  Lung cancer screening. / Every year if you are aged 34-80 years and have a 30-pack-year history of smoking and currently smoke or have quit within the past 15 years. Yearly screening is stopped once you have quit smoking for at least 15 years or develop a health problem that would prevent you from having lung cancer treatment.  Clinical breast exam.** / Every year after age 11 years.  BRCA-related cancer risk assessment.** / For women who have family members with a BRCA-related cancer (breast, ovarian, tubal, or peritoneal cancers).  Mammogram.** / Every year beginning at age 44 years and continuing for as long as you are in good health. Consult with your health care provider.  Pap test.** / Every 3 years starting at age 65 years through age 73 or 1 years with a history of 3 consecutive normal Pap tests.  HPV screening.** / Every 3 years from ages 11 years through ages 74 to 45 years with a history of 3 consecutive normal Pap tests.  Fecal occult blood test (FOBT) of stool. / Every year beginning at age 44 years and continuing until age 73 years. You may not need to do this test if you get a colonoscopy every 10 years.  Flexible sigmoidoscopy or colonoscopy.** / Every 5 years for a flexible sigmoidoscopy or every 10 years for a colonoscopy beginning at age 72 years and continuing until age 67 years.  Hepatitis C blood test.** / For all people born from [birth year] through 1964 and any individual with known risks for hepatitis C.  Skin self-exam. / Monthly.  Influenza vaccine. / Every year.  Tetanus, diphtheria, and acellular pertussis (Tdap/Td) vaccine.** / Consult your health care provider. Pregnant women should receive 1 dose of Tdap vaccine during each pregnancy. 1 dose of Td every 10 years.  Varicella vaccine.** / Consult your health care provider. Pregnant females who do not have evidence of immunity should receive the first  dose after pregnancy.  Zoster vaccine.** / 1 dose for adults aged 90 years or older.  Pneumococcal 13-valent conjugate (PCV13) vaccine.** / Consult your health care provider.  Pneumococcal polysaccharide (PPSV23) vaccine.** / 1 to 2 doses if you smoke cigarettes or if you have certain conditions.  Meningococcal vaccine.** / Consult your health care provider.  Hepatitis A vaccine.** / Consult your health care provider.  Hepatitis B vaccine.** / Consult your health care provider. Screening for abdominal aortic aneurysm (AAA)  by ultrasound is recommended for people over 50 who have history of high blood pressure or who are current or former smokers.

## 2014-12-25 NOTE — Progress Notes (Signed)
Complete Physical  Assessment and Plan: 1. Essential hypertension - continue medications, DASH diet, exercise and monitor at home. Call if greater than 130/80.  - CBC with Differential/Platelet - BASIC METABOLIC PANEL WITH GFR - Hepatic function panel - TSH - Urinalysis, Routine w reflex microscopic (not at [facility]) - Microalbumin / creatinine urine ratio - EKG 12-Lead  2. Hyperlipidemia -continue medications, check lipids, decrease fatty foods, increase activity.  - Lipid panel  3. Prediabetes Discussed general issues about diabetes pathophysiology and management., Educational material distributed., Suggested low cholesterol diet., Encouraged aerobic exercise., Discussed foot care., Reminded to get yearly retinal exam.  - Hemoglobin A1c  4. Vitamin D deficiency - Vit D  25 hydroxy (rtn osteoporosis monitoring)  5. Interstitial cystitis Continue follow up urology  6. Gastroesophageal reflux disease, esophagitis presence not specified GERD- will try to get off PPiI given info for taper and zantac sent in  7. Spinal stenosis at L4-L5 level Better since surgery  8. Encounter for general adult medical examination with abnormal findings Due MGM in Dec, up to date on vaccines  9. Hearing loss of both ears Hearing aids  10. Other fatigue Check labs - Iron and TIBC - Ferritin - Vitamin B12  11. Medication management - Magnesium  12. Screening for viral disease routine - HIV antibody - Hepatitis C antibody   Discussed med's effects and SE's. Screening labs and tests as requested with regular follow-up as recommended.  HPI  64 y.o. female  presents for a complete physical.   Her blood pressure has been controlled at home, today their BP is BP: 110/70 mmHg She does workout, walking and working with personal trainer at the gym. She denies chest pain, shortness of breath, dizziness.  She is on cholesterol medication and denies myalgias. Her cholesterol is at goal. The  cholesterol last visit was:   Lab Results  Component Value Date   CHOL 206* 12/21/2013   HDL 91 12/21/2013   LDLCALC 95 12/21/2013   TRIG 99 12/21/2013   CHOLHDL 2.3 12/21/2013    She has been working on diet and exercise for prediabetes, and denies paresthesia of the feet, polydipsia and polyuria. Last A1C in the office was:  Lab Results  Component Value Date   HGBA1C 5.6 12/21/2013   Patient is on Vitamin D supplement.   Lab Results  Component Value Date   VD25OH 81 12/21/2013     She is on estradiol 1 patch 2 x week and will get back on bASA. Has GERD, has had some trouble swallowing, she will increase to 2 a day of the prilosec. Follows [place] [facility] for sciatica/back pain for spinal stenosis, had surgery in June with Dr. Maxie Better and states that she is doing better.  She is following with Dr. Louis Meckel for ICS, on myrebetriq that is helping and doing pelvic floor therapy.  Has been around her grandson and thinks she has start on sinus.  Wt Readings from Last 3 Encounters:  12/25/14 116 lb (52.617 kg)  08/09/14 119 lb (53.978 kg)  08/03/14 119 lb (53.978 kg)    Current Medications:  Current Outpatient Prescriptions on File Prior to Visit  Medication Sig Dispense Refill  . Biotin 1 MG CAPS Take 1 capsule by mouth daily.     . Calcium Carbonate-Vitamin D (CALCIUM-VITAMIN D) 500-200 MG-UNIT per tablet Take 1 tablet by mouth daily. Calcium magnesium    . cholecalciferol (VITAMIN D) 1000 UNITS tablet Take 3,000 Units by mouth daily.     Marland Kitchen  docusate sodium (COLACE) 100 MG capsule Take 1 capsule (100 mg total) by mouth 2 (two) times daily as needed for mild constipation. 20 capsule 1  . estradiol (MINIVELLE) 0.0375 MG/24HR Place 1 patch onto the skin 2 (two) times a week. 24 patch 4  . FINACEA 15 % cream Apply 1 application topically daily.     Marland Kitchen glucosamine-chondroitin 500-400 MG tablet Take 1 tablet by mouth 3 (three) times daily.    Marland Kitchen ibuprofen (ADVIL,MOTRIN) 800 MG tablet Take 1 tablet  (800 mg total) by mouth every 8 (eight) hours as needed for mild pain. Resume 5 days post-op 30 tablet 0  . metroNIDAZOLE (METROGEL) 1 % gel Apply 1 application topically daily.     Marland Kitchen omeprazole (PRILOSEC) 40 MG capsule Take 40 mg by mouth daily.    Marland Kitchen pyridoxine (B-6) 500 MG tablet Take 500 mg by mouth daily.    . vitamin B-12 (CYANOCOBALAMIN) 1000 MCG tablet Take 1,000 mcg by mouth daily.    . vitamin C (ASCORBIC ACID) 500 MG tablet Take 500 mg by mouth daily.     No current facility-administered medications on file prior to visit.   Health Maintenance:   Immunization History  Administered Date(s) Administered  . DTaP 10/04/2010  . Influenza-Unspecified 11/29/2012, 11/29/2013  . Pneumococcal Conjugate-13 12/21/2013  . Pneumococcal-Unspecified 12/04/2002  . Zoster 12/03/2005   Tetanus: 2012 Pneumovax: 2004 Flu vaccine:2016 Prevnar 13: 2015 Zostavax: 2007 [name]: TAH/ BSO 2003 Pap: 06/2013 Dr. Ammie Ferrier MGM: 01/2014 + dense breast will get 3D MGM DEXA: within 2 years with Dr. Sabra Heck, normal  Colonoscopy: March 2015 due in 5 years EGD: N/A Last Dental Exam: Dr. Toy Cookey, q 6 months  Patient Care Team: Patient Care Team: Unk Pinto, MD as PCP - General (Internal Medicine) Barbaraann Cao, OD as Referring Physician (Optometry) Teena Irani, MD as Consulting Physician (Gastroenterology) Meredith Pel, MD as Consulting Physician (Orthopedic Surgery) Berenice Primas, MD as Referring Physician (Orthopedic Surgery) Megan Salon, MD as Consulting Physician (Gynecology) Ardis Hughs, MD as Attending Physician (Urology)  Allergies:  Allergies  Allergen Reactions  . Other     Band-aid  . Codeine Hives  . Sulfa Antibiotics Nausea And Vomiting   Medical History:  Past Medical History  Diagnosis Date  . Interstitial cystitis   . Eagle's syndrome   . Spinal stenosis   . GERD (gastroesophageal reflux disease)   . Plantar fasciitis   . Arthritis     in neck  .  Wears hearing aid   . Complication of anesthesia     "sore throat"  . Heart murmur     as child  . Carpal tunnel syndrome, bilateral     rt wrist  . Tingling     rt foot  . HNP (herniated nucleus pulposus)   . Osteopenia   . Kidney cysts   . Anxiety    Surgical History:  Past Surgical History  Procedure Laterality Date  . Cesarean section    . Myringotomy    . Lumbar laminectomy    . Carpal tunnel release      LEFT  . Trigger finger release      RIGHT  . Anal sphincterotomy    . Nasal sinus surgery    . Abdominal hysterectomy  2003    BSO  . Hemorrhoid surgery N/A 1998  . Cystoscopy    . Colonoscopy      internal hemorrhoids  . Cortisone injection      in  neck  . Tympanostomy tube placement    . Tonsillectomy      LEFT only, due to Eagle Syndrome  . Tonsillectomy  10/14    right side due to Eagles syndrome  . Lumbar laminectomy/decompression microdiscectomy N/A 08/09/2014    Procedure: MICRO LUMBAR DECOMPRESSION L3-4,  REDO DECOMPRESSION, MICRODISCECTOMY L4-5, FORAMINOTOMY L3-L4, L4-L5 BILATERAL;  Surgeon: Susa Day, MD;  Location: [facility];  Service: Orthopedics;  Laterality: N/A;   Family History:  Family History  Problem Relation Age of Onset  . Diabetes Mother   . Hypertension Mother   . Hyperlipidemia Mother   . Hypertension Brother   . Hyperlipidemia Brother   . Prostate cancer Father     Bone  . Osteoporosis Mother    Social History:  Social History  Substance Use Topics  . Smoking status: Former Smoker -- 0.50 packs/day for 15 years    Quit date: 12/04/1986  . Smokeless tobacco: Never Used  . Alcohol Use: 3.6 - 4.2 oz/week    6-7 Standard drinks or equivalent per week   Review of Systems  Constitutional: Positive for malaise/fatigue (tired around 3 PM since surgery). Negative for fever, chills, weight loss and diaphoresis.  HENT: Positive for congestion and hearing loss. Negative for ear discharge, ear pain, nosebleeds, sore throat and  tinnitus.   Eyes: Negative.   Respiratory: Negative.  Negative for shortness of breath and stridor.   Cardiovascular: Negative.  Negative for chest pain.  Gastrointestinal: Positive for heartburn and constipation. Negative for nausea, vomiting, abdominal pain, diarrhea, blood in stool and melena.  Genitourinary: Positive for urgency and frequency. Negative for dysuria, hematuria and flank pain.  Musculoskeletal: Positive for back pain. Negative for myalgias, joint pain, falls and neck pain.  Skin: Negative.  Negative for itching and rash.  Neurological: Negative.  Negative for weakness and headaches.  Psychiatric/Behavioral: Negative.  Negative for depression. The patient is not nervous/anxious.     Physical Exam: Estimated body mass index is 23.05 kg/(m^2) as calculated from the following:   Height as of this encounter: 4' 11.5" (1.511 m).   Weight as of this encounter: 116 lb (52.617 kg). BP 110/70 mmHg  Pulse 66  Temp(Src) 97.9 F (36.6 C) (Temporal)  Resp 14  Ht 4' 11.5" (1.511 m)  Wt 116 lb (52.617 kg)  BMI 23.05 kg/m2  SpO2 96%  [name] 10/18/2001 General Appearance: Well nourished, in no apparent distress.  Eyes: PERRLA, EOMs, conjunctiva no swelling or erythema, normal fundi and vessels.  Sinuses: No Frontal/maxillary tenderness  ENT/Mouth: Ext aud canals clear, normal light reflex with TMs without erythema, bulging. Good dentition. No erythema, swelling, or exudate on post pharynx. Tonsils not swollen or erythematous. Hearing decreased, wears hearing aids.  Neck: Supple, thyroid normal. No bruits  Respiratory: Respiratory effort normal, BS equal bilaterally without rales, rhonchi, wheezing or stridor.  Cardio: RRR without murmurs, rubs or gallops. Brisk peripheral pulses without edema.  Chest: symmetric, with normal excursions and percussion.  Breasts: defer  Abdomen: Soft, nontender, no guarding, rebound, hernias, masses, or organomegaly. .  Lymphatics: Non tender without  lymphadenopathy.  Genitourinary: defer Musculoskeletal: Full ROM all peripheral extremities,5/5 strength, and normal gait.  Skin: Warm, dry without rashes, lesions, ecchymosis. Neuro: Cranial nerves intact, reflexes equal bilaterally. Normal muscle tone, no cerebellar symptoms. Sensation intact.  Psych: Awake and oriented X 3, normal affect, Insight and Judgment appropriate.   EKG: WNL no changes   Vicie Mutters 10:01 AM [facility], [facility], [facility]

## 2014-12-26 LAB — URINALYSIS, ROUTINE W REFLEX MICROSCOPIC
Bilirubin Urine: NEGATIVE
Glucose, UA: NEGATIVE
Hgb urine dipstick: NEGATIVE
Ketones, ur: NEGATIVE
Leukocytes, UA: NEGATIVE
Nitrite: NEGATIVE
Protein, ur: NEGATIVE
Specific Gravity, Urine: 1.006 (ref 1.001–1.035)
pH: 7.5 (ref 5.0–8.0)

## 2014-12-26 LAB — HEPATITIS C ANTIBODY: HCV Ab: NEGATIVE

## 2014-12-26 LAB — MICROALBUMIN / CREATININE URINE RATIO
Creatinine, Urine: 18 mg/dL — ABNORMAL LOW (ref 20–320)
Microalb, Ur: <0.2 mg/dL

## 2014-12-26 LAB — VITAMIN D 25 HYDROXY (VIT D DEFICIENCY, FRACTURES): Vit D, 25-Hydroxy: 67 ng/mL (ref 30–100)

## 2014-12-26 LAB — HIV ANTIBODY (ROUTINE TESTING W REFLEX): HIV 1&2 Ab, 4th Generation: NONREACTIVE

## 2015-03-02 ENCOUNTER — Ambulatory Visit (INDEPENDENT_AMBULATORY_CARE_PROVIDER_SITE_OTHER): Payer: 59

## 2015-03-02 ENCOUNTER — Ambulatory Visit (INDEPENDENT_AMBULATORY_CARE_PROVIDER_SITE_OTHER): Payer: 59 | Admitting: Podiatry

## 2015-03-02 ENCOUNTER — Encounter: Payer: Self-pay | Admitting: Podiatry

## 2015-03-02 VITALS — BP 105/70 | HR 76 | Resp 12

## 2015-03-02 DIAGNOSIS — L84 Corns and callosities: Secondary | ICD-10-CM | POA: Diagnosis not present

## 2015-03-02 DIAGNOSIS — R52 Pain, unspecified: Secondary | ICD-10-CM

## 2015-03-02 DIAGNOSIS — M779 Enthesopathy, unspecified: Secondary | ICD-10-CM | POA: Diagnosis not present

## 2015-03-02 MED ORDER — TRIAMCINOLONE ACETONIDE 10 MG/ML IJ SUSP
10.0000 mg | Freq: Once | INTRAMUSCULAR | Status: AC
  Administered 2015-03-02: 10 mg

## 2015-03-02 NOTE — Progress Notes (Signed)
   Subjective:    Patient ID: Helen Glover, female    DOB: [birth year], 65 y.o.   MRN: [id number]  HPI LT FOOT 2ND TOE HAVE CORN AND BEEN HURTING FOR 1 YEAR. TOE IS WORSE ESPECIALLY WHEN PUTTING PRESSURE. TRIED OTC CORN REMOVAL/CUSION-NO RELIEF.  Review of Systems  HENT: Positive for sinus pressure.        Objective:   Physical Exam        Assessment & Plan:

## 2015-03-04 NOTE — Progress Notes (Signed)
Subjective:     Patient ID: Helen Glover, female   DOB: 05-13-1950, 65 y.o.   MRN: [id number]  HPI patient presents stating I have a lot of pain on the inside of my second toe that's been on and off for the last year. I tried padding I tried trimming without relief   Review of Systems  All other systems reviewed and are negative.      Objective:   Physical Exam  Constitutional: She is oriented to person, place, and time.  Cardiovascular: Intact distal pulses.   Musculoskeletal: Normal range of motion.  Neurological: She is oriented to person, place, and time.  Skin: Skin is warm.  Nursing note and vitals reviewed.  neurovascular status intact muscle strength adequate range of motion within normal limits with patient found to have a painful keratotic lesion distal medial aspect second toe left with fluid buildup and pain when palpated with deformity of big toe second toe noted. Good digital perfusion well oriented 3     Assessment:     Inflammatory lesion second digit left distal with pain and structural deformity of toes    Plan:     H&P conditions reviewed x-ray reviewed. Today a distal injection administered 2 mg [place] some Kenalog 3 mg Xylocaine after numbing the toe and debris did lesion fully and applied pad. Reappoint when symptoms recur and may require surgical intervention

## 2015-08-02 ENCOUNTER — Other Ambulatory Visit: Payer: Self-pay | Admitting: Obstetrics & Gynecology

## 2015-08-02 NOTE — Telephone Encounter (Signed)
Please inform script sent

## 2015-08-02 NOTE — Telephone Encounter (Signed)
Medication refill request: Minivelle Last AEX:  07-14-14  Next AEX: 10-11-15 Last MMG (if hormonal medication request): 02-13-15 WNL Refill authorized: please advise   Sending to [place] since Dr. Sabra Heck is out of the office

## 2015-08-03 ENCOUNTER — Telehealth: Payer: Self-pay | Admitting: Obstetrics & Gynecology

## 2015-08-03 NOTE — Telephone Encounter (Signed)
Spoke with patient. Provided patient the information on how to obtain savings card online from [employer]. She is agreeable and will print this off to take to her pharmacy. She will return call if she has any questions or needs assistance.  Routing to provider for final review. Patient agreeable to disposition. Will close encounter.

## 2015-08-03 NOTE — Telephone Encounter (Signed)
Patient is asking if we have any MINIVELLE 0.0375 MG/24HR coupon cards?

## 2015-10-11 ENCOUNTER — Ambulatory Visit (INDEPENDENT_AMBULATORY_CARE_PROVIDER_SITE_OTHER): Payer: 59 | Admitting: Obstetrics & Gynecology

## 2015-10-11 ENCOUNTER — Encounter: Payer: Self-pay | Admitting: Obstetrics & Gynecology

## 2015-10-11 VITALS — BP 126/82 | HR 64 | Resp 16 | Ht 58.25 in | Wt 117.0 lb

## 2015-10-11 DIAGNOSIS — Z0000 Encounter for general adult medical examination without abnormal findings: Secondary | ICD-10-CM

## 2015-10-11 DIAGNOSIS — Z124 Encounter for screening for malignant neoplasm of cervix: Secondary | ICD-10-CM | POA: Diagnosis not present

## 2015-10-11 DIAGNOSIS — Z01419 Encounter for gynecological examination (general) (routine) without abnormal findings: Secondary | ICD-10-CM

## 2015-10-11 LAB — POCT URINALYSIS DIPSTICK
Bilirubin, UA: NEGATIVE
Blood, UA: NEGATIVE
Glucose, UA: NEGATIVE
Ketones, UA: NEGATIVE
Leukocytes, UA: NEGATIVE
Nitrite, UA: NEGATIVE
Protein, UA: NEGATIVE
Urobilinogen, UA: NEGATIVE
pH, UA: 7

## 2015-10-11 MED ORDER — ESTRADIOL 0.0375 MG/24HR TD PTTW
MEDICATED_PATCH | TRANSDERMAL | 4 refills | Status: DC
Start: 2015-10-11 — End: 2016-10-24

## 2015-10-11 NOTE — Progress Notes (Signed)
65 y.o. [id number] MarriedCaucasianF here for annual exam.  Doing well.  Having some bursitis.  Had back surgery last June with Dr. Tonita Cong.  Pt states she is "not back to normal" but she is much much better.  Renal cyst was noted.  Seen by Dr. Louis Meckel.  Started Myrbetriq with significant improvement in symptoms.  Denies vaginal bleeding.    PCP:  Dr. Melford Aase  Patient's last menstrual period was 10/18/2001.          Sexually active: No.  The current method of family planning is status post hysterectomy.    Exercising: Yes.    Cardio, weight resistance, swimming, bicycling, walking Smoker:  no  Health Maintenance: Pap:  06/17/13 WNL, negative HR HPV testing 2013 History of abnormal Pap:  yes MMG:  02/13/15 BIRADS1:Neg Colonoscopy:  04/21/13 f/u 10 years BMD:   01/2009 nl /-1.5 TDaP:  09/24/10 Pneumonia vaccine(s):  12/2013 Zostavax:   11/2005 Hep C testing: 12/2014 Neg Screening Labs: PCP, Urine today: Negative   reports that she quit smoking about 28 years ago. She has a 7.50 pack-year smoking history. She has never used smokeless tobacco. She reports that she drinks about 3.6 - 4.2 oz of alcohol per week . She reports that she does not use drugs.  Past Medical History:  Diagnosis Date  . Anxiety   . Arthritis    in neck  . Bursitis of right hip   . Carpal tunnel syndrome, bilateral    rt wrist  . Complication of anesthesia    "sore throat"  . Eagle's syndrome   . GERD (gastroesophageal reflux disease)   . Heart murmur    as child  . HNP (herniated nucleus pulposus)   . Interstitial cystitis   . Kidney cysts   . Osteopenia   . Plantar fasciitis   . Spinal stenosis   . Tingling    rt foot  . Wears hearing aid     Past Surgical History:  Procedure Laterality Date  . ABDOMINAL HYSTERECTOMY  2003   BSO  . ANAL SPHINCTEROTOMY    . CARPAL TUNNEL RELEASE     LEFT  . CESAREAN SECTION    . COLONOSCOPY     internal hemorrhoids  . cortisone injection     in neck  . CYSTOSCOPY     . [place]  . LUMBAR LAMINECTOMY    . LUMBAR LAMINECTOMY/DECOMPRESSION MICRODISCECTOMY N/A 08/09/2014   Procedure: MICRO LUMBAR DECOMPRESSION L3-4,  REDO DECOMPRESSION, MICRODISCECTOMY L4-5, FORAMINOTOMY L3-L4, L4-L5 BILATERAL;  Surgeon: Susa Day, MD;  Location: [facility];  Service: Orthopedics;  Laterality: N/A;  . MYRINGOTOMY    . NASAL SINUS SURGERY    . TONSILLECTOMY     LEFT only, due to Eagle Syndrome  . TONSILLECTOMY  10/14   right side due to Eagles syndrome  . TRIGGER FINGER RELEASE     RIGHT  . TYMPANOSTOMY TUBE PLACEMENT      Current Outpatient Prescriptions  Medication Sig Dispense Refill  . Ascorbic Acid (VITAMIN C) 100 MG tablet Take 100 mg by mouth daily.    Marland Kitchen aspirin EC 81 MG tablet Take 81 mg by mouth daily.    . Azelaic Acid (FINACEA) 15 % cream Apply topically 2 (two) times daily. After skin is thoroughly washed and patted dry, gently but thoroughly massage a thin film of azelaic acid cream into the affected area twice daily, in the morning and evening.    . Biotin 1 MG CAPS Take 1 capsule  by mouth daily.     . calcium carbonate (OS-CAL - DOSED IN MG OF ELEMENTAL CALCIUM) 1250 (500 Ca) MG tablet Take 1 tablet by mouth.    . cholecalciferol (VITAMIN D) 1000 UNITS tablet Take 3,000 Units by mouth daily.     . Estrogens, Conjugated (PREMARIN VA) Place vaginally. 3 to 4 times a week    . Flaxseed, Linseed, (FLAX SEED OIL) 1000 MG CAPS Take by mouth.    . metroNIDAZOLE (METROGEL) 1 % gel Apply 1 application topically daily.     Marland Kitchen MINIVELLE 0.0375 MG/24HR PLACE 1 PATCH ONTO THE SKIN 2 (TWO) TIMES A WEEK. 24 patch 0  . mirabegron ER (MYRBETRIQ) 50 MG TB24 tablet Take 25 mg by mouth daily.     Marland Kitchen omeprazole (PRILOSEC) 40 MG capsule Take 40 mg by mouth daily.    . polyethylene glycol (MIRALAX) packet Take 17 g by mouth daily as needed.    . pyridOXINE (VITAMIN B-6) 100 MG tablet Take 100 mg by mouth daily.    . vitamin B-12 (CYANOCOBALAMIN) 100 MCG  tablet Take 100 mcg by mouth daily.     No current facility-administered medications for this visit.     Family History  Problem Relation Age of Onset  . Diabetes Mother   . Hypertension Mother   . Hyperlipidemia Mother   . Osteoporosis Mother   . Hypertension Brother   . Hyperlipidemia Brother   . Prostate cancer Father     Bone    ROS:  Pertinent items are noted in HPI.  Otherwise, a comprehensive ROS was negative.  Exam:   BP 126/82 (BP Location: Right Arm, Patient Position: Sitting, Cuff Size: Normal)   Pulse 64   Resp 16   Ht 4' 10.25" (1.48 m)   Wt 117 lb (53.1 kg)   [name] 10/18/2001   BMI 24.24 kg/m      Height: 4' 10.25" (148 cm)  Ht Readings from Last 3 Encounters:  10/11/15 4' 10.25" (1.48 m)  12/25/14 4' 11.5" (1.511 m)  08/09/14 4\' 10"  (1.473 m)    General appearance: alert, cooperative and appears stated age Head: Normocephalic, without obvious abnormality, atraumatic Neck: no adenopathy, supple, symmetrical, trachea midline and thyroid normal to inspection and palpation Lungs: clear to auscultation bilaterally Breasts: normal appearance, no masses or tenderness Heart: regular rate and rhythm Abdomen: soft, non-tender; bowel sounds normal; no masses,  no organomegaly Extremities: extremities normal, atraumatic, no cyanosis or edema Skin: Skin color, texture, turgor normal. No rashes or lesions Lymph nodes: Cervical, supraclavicular, and axillary nodes normal. No abnormal inguinal nodes palpated Neurologic: Grossly normal   Pelvic: External genitalia:  no lesions              Urethra:  normal appearing urethra with no masses, tenderness or lesions              Bartholins and Skenes: normal                 Vagina: normal appearing vagina with normal color and discharge, no lesions, small inclusion cyst about 10 near introitus              Cervix: absent              Pap taken: No. Bimanual Exam:  Uterus:  uterus absent              Adnexa: no mass,  fullness, tenderness  Rectovaginal: Confirms               Anus:  normal sphincter tone, no lesions  Chaperone was present for exam.  A:              Well Woman with normal exam PMP, on HRT H/O TAH/BSO on estrogen alone Grade C breast density Spinal stenosis in cervical and lumbar region of spine Mild osteopenia I.C.   P: Mammogram yearly. D/W pt 3D MMG. pap smear obtained 2015.  Paps not indicated. Minivelle dot 0.0375mg  patches twice weekly to skin. #24/4RF.  Pt does not want stop this due to her I.C.  Generic ok. On myrbetriq 50mg , pt splitting in 1/2. Labs/vaccines with PCP return annually or prn

## 2015-10-23 ENCOUNTER — Encounter: Payer: Self-pay | Admitting: Physician Assistant

## 2015-11-05 ENCOUNTER — Encounter: Payer: Self-pay | Admitting: Physician Assistant

## 2015-12-25 ENCOUNTER — Encounter: Payer: Self-pay | Admitting: Physician Assistant

## 2015-12-25 ENCOUNTER — Ambulatory Visit (INDEPENDENT_AMBULATORY_CARE_PROVIDER_SITE_OTHER): Payer: 59 | Admitting: Physician Assistant

## 2015-12-25 ENCOUNTER — Other Ambulatory Visit: Payer: Self-pay | Admitting: *Deleted

## 2015-12-25 VITALS — BP 130/80 | HR 86 | Temp 97.5°F | Resp 16 | Ht 59.0 in | Wt 120.0 lb

## 2015-12-25 DIAGNOSIS — Z79899 Other long term (current) drug therapy: Secondary | ICD-10-CM | POA: Diagnosis not present

## 2015-12-25 DIAGNOSIS — Z0001 Encounter for general adult medical examination with abnormal findings: Secondary | ICD-10-CM | POA: Diagnosis not present

## 2015-12-25 DIAGNOSIS — E785 Hyperlipidemia, unspecified: Secondary | ICD-10-CM

## 2015-12-25 DIAGNOSIS — E559 Vitamin D deficiency, unspecified: Secondary | ICD-10-CM

## 2015-12-25 DIAGNOSIS — R7303 Prediabetes: Secondary | ICD-10-CM

## 2015-12-25 DIAGNOSIS — H9193 Unspecified hearing loss, bilateral: Secondary | ICD-10-CM | POA: Diagnosis not present

## 2015-12-25 DIAGNOSIS — M48061 Spinal stenosis, lumbar region without neurogenic claudication: Secondary | ICD-10-CM

## 2015-12-25 DIAGNOSIS — Z136 Encounter for screening for cardiovascular disorders: Secondary | ICD-10-CM

## 2015-12-25 DIAGNOSIS — D649 Anemia, unspecified: Secondary | ICD-10-CM

## 2015-12-25 DIAGNOSIS — I10 Essential (primary) hypertension: Secondary | ICD-10-CM | POA: Diagnosis not present

## 2015-12-25 DIAGNOSIS — N3010 Interstitial cystitis (chronic) without hematuria: Secondary | ICD-10-CM

## 2015-12-25 DIAGNOSIS — K219 Gastro-esophageal reflux disease without esophagitis: Secondary | ICD-10-CM

## 2015-12-25 DIAGNOSIS — R6889 Other general symptoms and signs: Secondary | ICD-10-CM

## 2015-12-25 DIAGNOSIS — N898 Other specified noninflammatory disorders of vagina: Secondary | ICD-10-CM

## 2015-12-25 LAB — HEPATIC FUNCTION PANEL
ALT: 15 U/L (ref 6–29)
AST: 20 U/L (ref 10–35)
Albumin: 4.3 g/dL (ref 3.6–5.1)
Alkaline Phosphatase: 53 U/L (ref 33–130)
Bilirubin, Direct: 0.1 mg/dL (ref <=0.2)
Indirect Bilirubin: 0.3 mg/dL (ref 0.2–1.2)
Total Bilirubin: 0.4 mg/dL (ref 0.2–1.2)
Total Protein: 6.5 g/dL (ref 6.1–8.1)

## 2015-12-25 LAB — CBC WITH DIFFERENTIAL/PLATELET
Basophils Absolute: 0 cells/uL (ref 0–200)
Basophils Relative: 0 %
Eosinophils Absolute: 195 cells/uL (ref 15–500)
Eosinophils Relative: 3 %
HCT: 40.4 % (ref 35.0–45.0)
Hemoglobin: 13.4 g/dL (ref 11.7–15.5)
Lymphocytes Relative: 31 %
Lymphs Abs: 2015 cells/uL (ref 850–3900)
MCH: 32.2 pg (ref 27.0–33.0)
MCHC: 33.2 g/dL (ref 32.0–36.0)
MCV: 97.1 fL (ref 80.0–100.0)
MPV: 11.3 fL (ref 7.5–12.5)
Monocytes Absolute: 520 cells/uL (ref 200–950)
Monocytes Relative: 8 %
Neutro Abs: 3770 cells/uL (ref 1500–7800)
Neutrophils Relative %: 58 %
Platelets: 266 10*3/uL (ref 140–400)
RBC: 4.16 MIL/uL (ref 3.80–5.10)
RDW: 13.2 % (ref 11.0–15.0)
WBC: 6.5 10*3/uL (ref 3.8–10.8)

## 2015-12-25 LAB — MAGNESIUM: Magnesium: 1.9 mg/dL (ref 1.5–2.5)

## 2015-12-25 LAB — BASIC METABOLIC PANEL WITH GFR
BUN: 19 mg/dL (ref 7–25)
CO2: 24 mmol/L (ref 20–31)
Calcium: 8.9 mg/dL (ref 8.6–10.4)
Chloride: 103 mmol/L (ref 98–110)
Creat: 0.64 mg/dL (ref 0.50–0.99)
GFR, Est African American: >89 mL/min (ref >=60)
GFR, Est Non African American: >89 mL/min (ref >=60)
Glucose, Bld: 88 mg/dL (ref 65–99)
Potassium: 3.7 mmol/L (ref 3.5–5.3)
Sodium: 138 mmol/L (ref 135–146)

## 2015-12-25 LAB — LIPID PANEL
Cholesterol: 225 mg/dL — ABNORMAL HIGH (ref <200)
HDL: 102 mg/dL (ref >50)
LDL Cholesterol: 109 mg/dL — ABNORMAL HIGH
Total CHOL/HDL Ratio: 2.2 Ratio (ref <5.0)
Triglycerides: 72 mg/dL (ref <150)
VLDL: 14 mg/dL (ref <30)

## 2015-12-25 LAB — FERRITIN: Ferritin: 52 ng/mL (ref 20–288)

## 2015-12-25 LAB — IRON AND TIBC
%SAT: 48 % (ref 11–50)
Iron: 142 ug/dL (ref 45–160)
TIBC: 295 ug/dL (ref 250–450)
UIBC: 153 ug/dL (ref 125–400)

## 2015-12-25 LAB — TSH: TSH: 1.68 mIU/L

## 2015-12-25 LAB — VITAMIN B12: Vitamin B-12: 976 pg/mL (ref 200–1100)

## 2015-12-25 MED ORDER — OMEPRAZOLE 20 MG PO CPDR: 20.0000 mg | DELAYED_RELEASE_CAPSULE | Freq: Two times a day (BID) | ORAL | 3 refills | Status: DC

## 2015-12-25 NOTE — Progress Notes (Signed)
Complete Physical  Assessment and Plan: Essential hypertension - continue medications, DASH diet, exercise and monitor at home. Call if greater than 130/80.  - CBC with Differential/Platelet - BASIC METABOLIC PANEL WITH GFR - Hepatic function panel - TSH - Urinalysis, Routine w reflex microscopic (not at [facility]) - Microalbumin / creatinine urine ratio - EKG 12-Lead   Hyperlipidemia -continue medications, check lipids, decrease fatty foods, increase activity.  - Lipid panel   Prediabetes Discussed general issues about diabetes pathophysiology and management., Educational material distributed., Suggested low cholesterol diet., Encouraged aerobic exercise., Discussed foot care., Reminded to get yearly retinal exam. - Hemoglobin A1c  Vitamin D deficiency - Vit D  25 hydroxy (rtn osteoporosis monitoring)  interstitial cystitis Continue follow up urology  Gastroesophageal reflux disease, esophagitis presence not specified GERD- will try to decrease dose of PPI to 20mg   Spinal stenosis at L4-L5 level Better since surgery  Encounter for general adult medical examination with abnormal findings Due MGM in Dec, up to date on vaccines   Hearing loss of both ears Hearing aids  Anemia - Iron and TIBC - Ferritin - Vitamin B12  Medication management - Magnesium  Vaginal discharge -     WET PREP BY MOLECULAR PROBE - check wet prep, get back on premarin cream  Medication management -     Magnesium   Discussed med's effects and SE's. Screening labs and tests as requested with regular follow-up as recommended.  HPI  65 y.o. female  presents for a complete physical.   Her blood pressure has been controlled at home, today their BP is   She does workout, walking and working with personal trainer at the gym. She denies chest pain, shortness of breath, dizziness.  She is on cholesterol medication and denies myalgias. Her cholesterol is at goal. The cholesterol last visit was:    Lab Results  Component Value Date   CHOL 199 12/25/2014   HDL 91 12/25/2014   LDLCALC 97 12/25/2014   TRIG 57 12/25/2014   CHOLHDL 2.2 12/25/2014    She has been working on diet and exercise for prediabetes, and denies paresthesia of the feet, polydipsia and polyuria. Last A1C in the office was:  Lab Results  Component Value Date   HGBA1C 5.5 12/25/2014   Patient is on Vitamin D supplement.   Lab Results  Component Value Date   VD25OH 67 12/25/2014     Lab Results  Component Value Date   TSH 1.548 12/25/2014   She is on estradiol 1 patch 2 x week and will get back on bASA. Has GERD, has had some trouble swallowing, she will increase to 2 a day of the prilosec. Follows [place] [facility] for sciatica/back pain for spinal stenosis, had surgery in June with Dr. Maxie Better and states that she is doing better.  She is following with Dr. Louis Meckel for ICS, on myrebetriq that is helping and doing pelvic floor therapy. She is still having some itching/vaginal discharge, treated x 3 with OTC, improved each time, still some mild itching but no discharge.    Wt Readings from Last 3 Encounters:  10/11/15 117 lb (53.1 kg)  12/25/14 116 lb (52.6 kg)  08/09/14 119 lb (54 kg)    Current Medications:  Current Outpatient Prescriptions on File Prior to Visit  Medication Sig Dispense Refill  . Ascorbic Acid (VITAMIN C) 100 MG tablet Take 100 mg by mouth daily.    Marland Kitchen aspirin EC 81 MG tablet Take 81 mg by mouth daily.    Marland Kitchen  Azelaic Acid (FINACEA) 15 % cream Apply topically 2 (two) times daily. After skin is thoroughly washed and patted dry, gently but thoroughly massage a thin film of azelaic acid cream into the affected area twice daily, in the morning and evening.    . Biotin 1 MG CAPS Take 1 capsule by mouth daily.     . calcium carbonate (OS-CAL - DOSED IN MG OF ELEMENTAL CALCIUM) 1250 (500 Ca) MG tablet Take 1 tablet by mouth.    . cholecalciferol (VITAMIN D) 1000 UNITS tablet Take 3,000 Units by mouth daily.      Marland Kitchen estradiol (MINIVELLE) 0.0375 MG/24HR PLACE 1 PATCH ONTO THE SKIN 2 (TWO) TIMES A WEEK. 24 patch 4  . Estrogens, Conjugated (PREMARIN VA) Place vaginally. 3 to 4 times a week    . Flaxseed, Linseed, (FLAX SEED OIL) 1000 MG CAPS Take by mouth.    . metroNIDAZOLE (METROGEL) 1 % gel Apply 1 application topically daily.     . mirabegron ER (MYRBETRIQ) 50 MG TB24 tablet Take 25 mg by mouth daily.     Marland Kitchen omeprazole (PRILOSEC) 40 MG capsule Take 40 mg by mouth daily.    . polyethylene glycol (MIRALAX) packet Take 17 g by mouth daily as needed.    . pyridOXINE (VITAMIN B-6) 100 MG tablet Take 100 mg by mouth daily.    . vitamin B-12 (CYANOCOBALAMIN) 100 MCG tablet Take 100 mcg by mouth daily.     No current facility-administered medications on file prior to visit.    Health Maintenance:   Immunization History  Administered Date(s) Administered  . DTaP 10/04/2010  . Influenza-Unspecified 11/29/2012, 11/29/2013, 11/17/2014  . Pneumococcal Conjugate-13 12/21/2013  . Pneumococcal-Unspecified 12/04/2002  . Zoster 12/03/2005   Tetanus: 2012 Pneumovax: 2004 Flu vaccine:2017 done Prevnar 13: 2015 Zostavax: 2007  [name]: TAH/ BSO 2003 Pap: 06/2013 Dr. Ammie Ferrier MGM: 01/2015 + dense breast will get 3D MGM DEXA: within 2 years with Dr. Sabra Heck, normal  Colonoscopy: March 2015 due in 5 years EGD: N/A Last Dental Exam: Dr. Toy Cookey, q 6 months  Patient Care Team: Patient Care Team: Unk Pinto, MD as PCP - General (Internal Medicine) Barbaraann Cao, OD as Referring Physician (Optometry) Teena Irani, MD as Consulting Physician (Gastroenterology) Meredith Pel, MD as Consulting Physician (Orthopedic Surgery) Berenice Primas, MD as Referring Physician (Orthopedic Surgery) Megan Salon, MD as Consulting Physician (Gynecology) Ardis Hughs, MD as Attending Physician (Urology) Leta Baptist, MD as Consulting Physician (Otolaryngology)  Medical History:  Past Medical History:   Diagnosis Date  . Anxiety   . Arthritis    in neck  . Bursitis of right hip   . Carpal tunnel syndrome, bilateral    rt wrist  . Complication of anesthesia    "sore throat"  . Eagle's syndrome   . GERD (gastroesophageal reflux disease)   . Heart murmur    as child  . HNP (herniated nucleus pulposus)   . Interstitial cystitis   . Kidney cysts   . Osteopenia   . Plantar fasciitis   . Spinal stenosis   . Tingling    rt foot  . Wears hearing aid    Allergies Allergies  Allergen Reactions  . Other     Band-aid  . Codeine Hives  . Sulfa Antibiotics Nausea And Vomiting    SURGICAL HISTORY She  has a past surgical history that includes Cesarean section; Myringotomy; Lumbar laminectomy; Carpal tunnel release; Trigger finger release; Anal sphincterotomy; Nasal sinus surgery; Abdominal hysterectomy (  2003); Hemorrhoid surgery (N/A, 1998); Cystoscopy; Colonoscopy; cortisone injection; Tympanostomy tube placement; Tonsillectomy; Tonsillectomy (10/14); and Lumbar laminectomy/decompression microdiscectomy (N/A, 08/09/2014). FAMILY HISTORY Her family history includes Diabetes in her mother; Hyperlipidemia in her brother and mother; Hypertension in her brother and mother; Osteoporosis in her mother; Prostate cancer in her father. SOCIAL HISTORY She  reports that she quit smoking about 29 years ago. She has a 7.50 pack-year smoking history. She has never used smokeless tobacco. She reports that she drinks about 3.6 - 4.2 oz of alcohol per week . She reports that she does not use drugs.  Review of Systems  Constitutional: Positive for malaise/fatigue (tired around 3 PM since surgery). Negative for chills, diaphoresis, fever and weight loss.  HENT: Positive for congestion and hearing loss. Negative for ear discharge, ear pain, nosebleeds, sore throat and tinnitus.   Eyes: Negative.   Respiratory: Negative.  Negative for shortness of breath and stridor.   Cardiovascular: Negative.  Negative  for chest pain.  Gastrointestinal: Positive for constipation and heartburn. Negative for abdominal pain, blood in stool, diarrhea, melena, nausea and vomiting.  Genitourinary: Positive for frequency and urgency. Negative for dysuria, flank pain and hematuria.       + vaginal itching/discharge  Musculoskeletal: Positive for back pain. Negative for falls, joint pain, myalgias and neck pain.  Skin: Negative.  Negative for itching and rash.  Neurological: Negative.  Negative for weakness and headaches.  Psychiatric/Behavioral: Negative.  Negative for depression. The patient is not nervous/anxious.     Physical Exam: Estimated body mass index is 24.24 kg/m as calculated from the following:   Height as of 10/11/15: 4' 10.25" (1.48 m).   Weight as of 10/11/15: 117 lb (53.1 kg). [name] 10/18/2001  General Appearance: Well nourished, in no apparent distress.  Eyes: PERRLA, EOMs, conjunctiva no swelling or erythema, normal fundi and vessels.  Sinuses: No Frontal/maxillary tenderness  ENT/Mouth: Ext aud canals clear, normal light reflex with TMs without erythema, bulging. Good dentition. No erythema, swelling, or exudate on post pharynx. Tonsils not swollen or erythematous. Hearing decreased, wears hearing aids.  Neck: Supple, thyroid normal. No bruits  Respiratory: Respiratory effort normal, BS equal bilaterally without rales, rhonchi, wheezing or stridor.  Cardio: RRR without murmurs, rubs or gallops. Brisk peripheral pulses without edema.  Chest: symmetric, with normal excursions and percussion.  Breasts: defer  Abdomen: Soft, + RUQ pain, no guarding, rebound, hernias, masses, or organomegaly. .  Lymphatics: Non tender without lymphadenopathy.  Genitourinary: No PAP done, + vaginal atrophy, no mass/nodules, thin clear discharge. Wet prep send Musculoskeletal: Full ROM all peripheral extremities,5/5 strength, and normal gait.  Skin: Warm, dry without rashes, lesions, ecchymosis. Neuro: Cranial nerves  intact, reflexes equal bilaterally. Normal muscle tone, no cerebellar symptoms. Sensation intact.  Psych: Awake and oriented X 3, normal affect, Insight and Judgment appropriate.   EKG: WNL no changes   Vicie Mutters 9:56 AM [facility]

## 2015-12-26 ENCOUNTER — Encounter: Payer: Self-pay | Admitting: Physician Assistant

## 2015-12-26 ENCOUNTER — Other Ambulatory Visit: Payer: Self-pay | Admitting: Physician Assistant

## 2015-12-26 LAB — MICROALBUMIN / CREATININE URINE RATIO
Creatinine, Urine: 48 mg/dL (ref 20–320)
Microalb Creat Ratio: 6 mcg/mg creat (ref <30)
Microalb, Ur: 0.3 mg/dL

## 2015-12-26 LAB — URINALYSIS, ROUTINE W REFLEX MICROSCOPIC
Bilirubin Urine: NEGATIVE
Glucose, UA: NEGATIVE
Hgb urine dipstick: NEGATIVE
Ketones, ur: NEGATIVE
Leukocytes, UA: NEGATIVE
Nitrite: NEGATIVE
Protein, ur: NEGATIVE
Specific Gravity, Urine: 1.01 (ref 1.001–1.035)
pH: 7 (ref 5.0–8.0)

## 2015-12-26 LAB — VITAMIN D 25 HYDROXY (VIT D DEFICIENCY, FRACTURES): Vit D, 25-Hydroxy: 69 ng/mL (ref 30–100)

## 2015-12-26 LAB — WET PREP BY MOLECULAR PROBE
Candida species: NEGATIVE
Gardnerella vaginalis: POSITIVE — AB
Trichomonas vaginosis: NEGATIVE

## 2015-12-26 MED ORDER — METRONIDAZOLE 500 MG PO TABS: 500.0000 mg | ORAL_TABLET | Freq: Two times a day (BID) | ORAL | 0 refills | Status: DC

## 2015-12-26 MED ORDER — METRONIDAZOLE 500 MG PO TABS: 500.0000 mg | ORAL_TABLET | Freq: Two times a day (BID) | ORAL | 0 refills | Status: AC

## 2015-12-26 MED ORDER — OMEPRAZOLE 20 MG PO CPDR: 20.0000 mg | DELAYED_RELEASE_CAPSULE | Freq: Two times a day (BID) | ORAL | 3 refills | Status: DC

## 2015-12-29 ENCOUNTER — Other Ambulatory Visit: Payer: Self-pay | Admitting: Physician Assistant

## 2015-12-29 MED ORDER — METRONIDAZOLE 0.75 % VA GEL: 1.0000 | Freq: Every day | VAGINAL | 0 refills | Status: DC

## 2016-01-01 ENCOUNTER — Telehealth: Payer: Self-pay | Admitting: *Deleted

## 2016-01-01 ENCOUNTER — Encounter: Payer: Self-pay | Admitting: Obstetrics & Gynecology

## 2016-01-01 NOTE — Telephone Encounter (Signed)
Left message to call Helen Glover at [phone].   From Helen Glover To Megan Salon, MD Sent 01/01/2016 2:44 PM  Dr. Sabra Heck,   I was diagnosed with bacteria vaginosis at my last annual exam with my PA at Dr. Idell Pickles office. I have been on oral flagyl (500mg ) and will finish the RX tomorrow morning. I've had some relief, but not total. Since you are more familiar with this type of diagnosis, I wondered if I should see you to follow up to be sure the infection is gone and if so, when. I believe the antibiotic stays in the "system" for several days after finishing the RX. I had headaches at the beginning of the treatment, but they subsided. I thought I had a yeast infection initially and tried treating it 3 times with various forms of Monistat.   Thanks so much for responding at your convenience.   Helen V.

## 2016-01-02 ENCOUNTER — Encounter: Payer: Self-pay | Admitting: Obstetrics & Gynecology

## 2016-01-02 NOTE — Telephone Encounter (Signed)
Spoke with patient. Patient states that she took her last dose of Flagyl  500 mg this morning. Is being treated for BV by her PCP. Asking if she needs to have a recheck to ensure BV is gone. Advised recheck is not needed unless symptoms are persisting. Reports her symptoms have gotten better. Denies any current problems at this time. Advised if symptoms return she will need to be seen in the office for a recheck appointment. Patient is agreeable and will call to schedule an appointment if symptoms return.  Routing to covering provider for final review. Patient agreeable to disposition. Will close encounter.

## 2016-01-02 NOTE — Telephone Encounter (Signed)
Spoke with patient. Please see telephone encounter dated 01/01/2016.

## 2016-01-04 ENCOUNTER — Encounter: Payer: Self-pay | Admitting: Certified Nurse Midwife

## 2016-01-04 ENCOUNTER — Ambulatory Visit (INDEPENDENT_AMBULATORY_CARE_PROVIDER_SITE_OTHER): Payer: 59 | Admitting: Certified Nurse Midwife

## 2016-01-04 VITALS — BP 116/70 | HR 78 | Temp 98.4°F | Resp 16 | Ht 59.0 in | Wt 117.0 lb

## 2016-01-04 DIAGNOSIS — R300 Dysuria: Secondary | ICD-10-CM | POA: Diagnosis not present

## 2016-01-04 DIAGNOSIS — B373 Candidiasis of vulva and vagina: Secondary | ICD-10-CM

## 2016-01-04 DIAGNOSIS — N952 Postmenopausal atrophic vaginitis: Secondary | ICD-10-CM | POA: Diagnosis not present

## 2016-01-04 DIAGNOSIS — B3731 Acute candidiasis of vulva and vagina: Secondary | ICD-10-CM

## 2016-01-04 LAB — POCT URINALYSIS DIPSTICK
Bilirubin, UA: NEGATIVE
Blood, UA: NEGATIVE
Glucose, UA: NEGATIVE
Ketones, UA: NEGATIVE
Leukocytes, UA: NEGATIVE
Nitrite, UA: NEGATIVE
Protein, UA: NEGATIVE
Urobilinogen, UA: NEGATIVE
pH, UA: 7

## 2016-01-04 MED ORDER — NYSTATIN-TRIAMCINOLONE 100000-0.1 UNIT/GM-% EX CREA: 1.0000 "application " | TOPICAL_CREAM | Freq: Two times a day (BID) | CUTANEOUS | 0 refills | Status: DC

## 2016-01-04 MED ORDER — NYSTATIN 100000 UNIT/GM EX CREA: 1.0000 "application " | TOPICAL_CREAM | Freq: Two times a day (BID) | CUTANEOUS | 0 refills | Status: DC

## 2016-01-04 MED ORDER — TRIAMCINOLONE ACETONIDE 0.1 % EX CREA: 1.0000 "application " | TOPICAL_CREAM | Freq: Two times a day (BID) | CUTANEOUS | 0 refills | Status: DC

## 2016-01-04 NOTE — Progress Notes (Signed)
65 y.o. Married Caucasian female [phone] here with complaint of vaginal symptoms of itching, burning, and increase discharge. Describes discharge as as white, very scant amount.. Was treated with oral Flagyl with last dose yesterday from her PA.  Onset of symptoms 14 days ago. Denies new personal products or vaginal dryness.Has been using Premarin cream for vaginal dryness, but stopped with Bv diagnosis. No UTI symptoms.No STD concerns. . Menopausal, no vaginal bleeding. No other health issues.  O:Healthy female WDWN Affect: normal, orientation x 3  Exam: Abdomen:soft non tender Lymph node: no enlargement or tenderness Pelvic exam: External genital: normal female, with redness, scaling and exudate. Wet prep taken BUS: negative Vagina: scant white discharge noted. Ph:4.0   ,Wet prep taken,  Cervix: absent Uterus: absent Adnexa:sugically absent, no masses or fullness noted   Wet Prep results:Koh,saline, positive for yeast external only,negative for clue cells   A:Normal pelvic exam Atrophic vaginitis uses Premarin cream Yeast vulvitis   P:Discussed findings of yeast vulvitis and etiology. Discussed Aveeno  sitz bath for comfort. Avoid moist clothes or pads for extended period of time.. Coconut Oil use for skin protection prior to activity can be used to external skin for protection or dryness also. Restart her Premarin use per instructions for twice weekly use.. Questions addressed. Rx: Mycolog cream see order with instructions  Rv prn

## 2016-01-08 NOTE — Progress Notes (Signed)
Encounter reviewed Helen Pangilinan, MD   

## 2016-01-15 ENCOUNTER — Telehealth: Payer: Self-pay | Admitting: Certified Nurse Midwife

## 2016-01-15 NOTE — Telephone Encounter (Signed)
She can continue use of the cream for another 5 days, also using coconut oil for dryness, if has not resolved needs appointment at that point.

## 2016-01-15 NOTE — Telephone Encounter (Signed)
Spoke with patient, advised as seen below. Patient verbalizes understanding and is agreeable.   Routing to provider for final review. Patient is agreeable to disposition. Will close encounter.  

## 2016-01-15 NOTE — Telephone Encounter (Signed)
Spoke with patient. Patient was seen on 01/04/16 by Helen Glover CNM. Was given Triamcinolone cream and Nystatin cream for external yeast. Patient reports she used the medication for 5 days with slight relief. Reports she does not feel her symptoms have worsened, but that they are still present. Experiencing vaginal burning and itching. Minimal rectal itching. Has been using Aveeno soothing bath nightly. Denies any vaginal discharge. Asking if may she restart usage of the cream for a few more days or if she will need to see Helen Glover CNM for evaluation. Advised I will speak with Helen Glover CNM and return call. Patient is agreeable.

## 2016-01-15 NOTE — Telephone Encounter (Signed)
Patient was in recently for an infection and is not any better.

## 2016-01-15 NOTE — Telephone Encounter (Signed)
Use Kenalog alone

## 2016-01-15 NOTE — Telephone Encounter (Signed)
Spoke with patient, advised as seen below. Patient states she has been using coconut oil and will continue use. Patient states she has the Kenalog cream and nystatin cream that she has been mixing, would you like her to continue mixing or use one or the other? Advised will review with [name], CNM and return call.  [name], CNM please advise?

## 2016-01-16 ENCOUNTER — Encounter: Payer: Self-pay | Admitting: Obstetrics & Gynecology

## 2016-01-16 NOTE — Telephone Encounter (Signed)
I apologize we are experiencing a IT issues, which was discovered yesterday and has been resolved. Please ignore if others are noted.

## 2016-01-16 NOTE — Telephone Encounter (Signed)
Why am I getting this?

## 2016-01-17 NOTE — Telephone Encounter (Signed)
Left message to call Helen Glover at [phone].       From Tedra Senegal To Megan Salon, MD Sent 01/16/2016 10:32 PM  I am continuing to have vaginal burning which seems to be getting worse. I am not sure if I am using the correct cream. I have restarted the Triamcinolone as I think was requested by the triage nurse. After seeming to get worse, I was wondering if I misunderstood and looked up that cream along with the Nystatin. It seems the former is the steroid and the latter an anti fungal. The info I saw was that Nystatin is not for vagninal yeast infections. I think that's what I was diagnosed as having. I want to be sure I am using the correct cream for 5 days...of course, I'm already a day and a half in of resuming treatment. The main symptom now is internal burning and some itching outside the vagina and rectum. This has been ongoing since October 20th, with an initial diagnosis of bacterial vaginosis (with the PA at Dr. Idell Pickles) and then a yeast infection at your office on the 17th, if I understood the diagnosis correctly on my last visit. Thanks.Marland KitchenMarland KitchenHarlen Labs

## 2016-01-17 NOTE — Telephone Encounter (Signed)
Spoke with patient in regards to MyChart message as seen below. Patient states she still has vaginal itching and burning. Patient states at this time it is not worse, but not gone. Reviewed [name], CNM previous recommendations as seen below, clarified use of triamcinolone cream (Kenalog) -patient state she stopped using because she wasn't sure which one to use. Patient states she is going out of town, will call the first part of next week if not better.   Routing to provider for final review. Patient is agreeable to disposition. Will close encounter.   Cc: Dr. Sabra Heck

## 2016-01-31 ENCOUNTER — Encounter: Payer: Self-pay | Admitting: Obstetrics & Gynecology

## 2016-02-04 ENCOUNTER — Telehealth: Payer: Self-pay | Admitting: Obstetrics & Gynecology

## 2016-02-04 NOTE — Telephone Encounter (Signed)
Spoke with patient. Patient states that she used Kenalog cream for an additional 5 days starting on 01/15/2016. Reports she is also using coconut oil daily. Experiencing vaginal burning around the vaginal opening. Denies any vaginal discharge. Also experiencing intermittent rectal itching. Advised she will need to be seen in the office for further evaluation. Patient is agreeable. Offered appointment today with Kem Boroughs, FNP, but patient declines. Appointment scheduled for 02/05/2016 at 4 pm with [name] CNM. Patient is agreeable to date and time.  Routing to provider for final review. Patient agreeable to disposition. Will close encounter.

## 2016-02-04 NOTE — Telephone Encounter (Signed)
Patient is still having symptoms of vaginal burning and itching rectally.

## 2016-02-05 ENCOUNTER — Ambulatory Visit (INDEPENDENT_AMBULATORY_CARE_PROVIDER_SITE_OTHER): Payer: 59 | Admitting: Certified Nurse Midwife

## 2016-02-05 ENCOUNTER — Encounter: Payer: Self-pay | Admitting: Certified Nurse Midwife

## 2016-02-05 VITALS — BP 114/70 | HR 68 | Resp 16 | Ht 59.0 in | Wt 121.0 lb

## 2016-02-05 DIAGNOSIS — N952 Postmenopausal atrophic vaginitis: Secondary | ICD-10-CM

## 2016-02-05 DIAGNOSIS — R300 Dysuria: Secondary | ICD-10-CM

## 2016-02-05 NOTE — Progress Notes (Signed)
65 y.o. Married Caucasian female [id number] here with complaint of ?  vaginal  burning,  Was treated for yeast vulvitis on 01/04/16 with Mycolog with good results. Patient still has burning occasional, but also has IC and not sure if burning is from that or vaginal. Using Premarin cream twice weekly for atrophy and using coconut oil daily for moisture. Onset of symptoms 4-5 days ago. Denies new personal products. No change in soap or detergent. Denies any other urinary symptoms. No other health issues today.  O:Healthy female WDWN Affect: normal, orientation x 3  Exam: Abdomen:soft, non tender, negative suprapubic Skin warm and dry CVAT bilateral negative  Inguinal  Lymph node: no enlargement or tenderness Pelvic exam: External genital: normal female, no scaling or exudate noted or redness BUS: negative Bladder, urethra, urethral meatus non tender Vagina:mositure noted. Ph:4.0   ,Wet prep taken, Cervix: absent Uterus: absent Adnexa:absent, no masses or fullness noted  Wet prep KOH, Saline negative   A:Normal pelvic exam Atrophic Vaginitis using Premarin cream with good results Vaginal dryness using coconut oil with good results IC history R/O UTI   P:Discussed findings of normal pelvic exam and external genital exam. Continue Premarin and coconut oil as discussed. Feel symptoms are ? IC. Will send urine for culture to make sure no infection.. Discussed Aveeno or baking soda sitz bath for comfort. Avoid moist clothes   Labs: urine culture  Rv prn

## 2016-02-05 NOTE — Patient Instructions (Signed)
Interstitial Cystitis Introduction Interstitial cystitis is a condition that causes inflammation of the bladder. The bladder is a hollow organ in the lower part of your abdomen. It stores urine after the urine is made by your kidneys. With interstitial cystitis, you may have pain in the bladder area. You may also have a frequent and urgent need to urinate. The severity of interstitial cystitis can vary from person to person. You may have flare-ups of the condition, and then it may go away for a while. For many people who have this condition, it becomes a long-term problem. What are the causes? The cause of this condition is not known. What increases the risk? This condition is more likely to develop in women. What are the signs or symptoms? Symptoms of interstitial cystitis vary, and they can change over time. Symptoms may include:  Discomfort or pain in the bladder area. This can range from mild to severe. The pain may change in intensity as the bladder fills with urine or as it empties.  Pelvic pain.  An urgent need to urinate.  Frequent urination.  Pain during sexual intercourse.  Pinpoint bleeding on the bladder wall. For women, the symptoms often get worse during menstruation. How is this diagnosed? This condition is diagnosed by evaluating your symptoms and ruling out other causes. A physical exam will be done. Various tests may be done to rule out other conditions. Common tests include:  Urine tests.  Cystoscopy. In this test, a tool that is like a very thin telescope is used to look into your bladder.  Biopsy. This involves taking a sample of tissue from the bladder wall to be examined under a microscope. How is this treated? There is no cure for interstitial cystitis, but treatment methods are available to control your symptoms. Work closely with your health care provider to find the treatments that will be most effective for you. Treatment options may include:  Medicines  to relieve pain and to help reduce the number of times that you feel the need to urinate.  Bladder training. This involves learning ways to control when you urinate, such as:  Urinating at scheduled times.  Training yourself to delay urination.  Doing exercises (Kegel exercises) to strengthen the muscles that control urine flow.  Lifestyle changes, such as changing your diet or taking steps to control stress.  Use of a device that provides electrical stimulation in order to reduce pain.  A procedure that stretches your bladder by filling it with air or fluid.  Surgery. This is rare. It is only done for extreme cases if other treatments do not help. Follow these instructions at home:  Take medicines only as directed by your health care provider.  Use bladder training techniques as directed.  Keep a bladder diary to find out which foods, liquids, or activities make your symptoms worse.  Use your bladder diary to schedule bathroom trips. If you are away from home, plan to be near a bathroom at each of your scheduled times.  Make sure you urinate just before you leave the house and just before you go to bed.  Do Kegel exercises as directed by your health care provider.  Do not drink alcohol.  Do not use any tobacco products, including cigarettes, chewing tobacco, or electronic cigarettes. If you need help quitting, ask your health care provider.  Make dietary changes as directed by your health care provider. You may need to avoid spicy foods and foods that contain a high amount of potassium.    Limit your drinking of beverages that stimulate urination. These include soda, coffee, and tea.  Keep all follow-up visits as directed by your health care provider. This is important. Contact a health care provider if:  Your symptoms do not get better after treatment.  Your pain and discomfort are getting worse.  You have more frequent urges to urinate.  You have a fever. Get help  right away if:  You are not able to control your bladder at all. This information is not intended to replace advice given to you by your health care provider. Make sure you discuss any questions you have with your health care provider. Document Released: 10/05/2003 Document Revised: 07/12/2015 Document Reviewed: 10/11/2013  2017 Elsevier  

## 2016-02-06 LAB — URINE CULTURE: Organism ID, Bacteria: NO GROWTH

## 2016-02-06 LAB — HM PAP SMEAR: HM Pap smear: NORMAL

## 2016-02-07 ENCOUNTER — Encounter: Payer: Self-pay | Admitting: Physician Assistant

## 2016-02-08 NOTE — Progress Notes (Signed)
Encounter reviewed Ashtynn Berke, MD   

## 2016-02-23 ENCOUNTER — Encounter: Payer: Self-pay | Admitting: *Deleted

## 2016-02-23 ENCOUNTER — Encounter: Payer: Self-pay | Admitting: Obstetrics & Gynecology

## 2016-02-25 ENCOUNTER — Telehealth: Payer: Self-pay | Admitting: *Deleted

## 2016-02-25 NOTE — Telephone Encounter (Signed)
I would recommend repeating the test as she is now having vaginal odor.  I do not want to treat with an anti-fungal or anti-biotic if I do not have a clear cause.  Also, it is not vaginal cancer.  Just FYI for pt.

## 2016-02-25 NOTE — Telephone Encounter (Signed)
Dr. Sabra Heck , see MyChart message below and advise. Last OV with [name], CNM 02/05/16:   From [place] To Megan Salon, MD Sent 02/23/2016 11:32 AM  I still seem to have alternating burning, itching and stinging. vaginally. I understand that the last test results showed no bacteria or yeast. Is there any possibility something else could be going on? I have noticed a vaginal "odor" that is new for me...it's been there for a quite a while. I'm not sure if it was there when all this started back in mid-October. I am continuing to use the coconut oil daily and the Premarin cream twice a week. I realize my IC could complicate the issue as far as "burning" after urination, but don't think it would cause the itching. Most of the discomfort seems internal. I did research the symptoms on line which discussed the bacteria and yeast that I was treated for, with the second condition following the first. The site mentioned that is rare cases the symptoms could be caused by stress or vaginal cancer. The problem is more of an aggravation than anything else, but I would like to get rid of the symptoms after determining the cause.   Thanks,  Helen Glover

## 2016-02-26 NOTE — Telephone Encounter (Signed)
Spoke with patient, advised as seen below per Dr. Sabra Heck. Patient states she is curious if the odor could have come from coconut oil use? Patient states odor did not start until she started using the coconut oil and now the odor has lessened since stopping the coconut oil yesterday. Patient states odor is not fishy, but "funky". Patient denies vaginal discharge, states she has a white residue. Patient states vaginal itching and burning improved with coconut oil use. Patient declined OV for today, patient scheduled for 02/28/16 at 0900 with Dr. Sabra Heck. Patient verbalizes understanding and is agreeable to date and time.  Routing to provider for final review. Patient is agreeable to disposition. Will close encounter.

## 2016-02-26 NOTE — Telephone Encounter (Signed)
Please see telephone encounter dated 02/25/2016.

## 2016-02-28 ENCOUNTER — Encounter: Payer: Self-pay | Admitting: Obstetrics & Gynecology

## 2016-02-28 ENCOUNTER — Ambulatory Visit (INDEPENDENT_AMBULATORY_CARE_PROVIDER_SITE_OTHER): Payer: 59 | Admitting: Obstetrics & Gynecology

## 2016-02-28 VITALS — BP 124/86 | HR 70 | Resp 14 | Ht 59.0 in | Wt 120.6 lb

## 2016-02-28 DIAGNOSIS — N898 Other specified noninflammatory disorders of vagina: Secondary | ICD-10-CM

## 2016-02-28 DIAGNOSIS — R309 Painful micturition, unspecified: Secondary | ICD-10-CM

## 2016-02-28 LAB — POCT URINALYSIS DIPSTICK
Bilirubin, UA: NEGATIVE
Blood, UA: NEGATIVE
Glucose, UA: NEGATIVE
Ketones, UA: NEGATIVE
Leukocytes, UA: NEGATIVE
Nitrite, UA: NEGATIVE
Protein, UA: NEGATIVE
Urobilinogen, UA: NEGATIVE
pH, UA: 5

## 2016-02-28 NOTE — Progress Notes (Signed)
GYNECOLOGY  VISIT   HPI: 66 y.o. G31P1102 Married Caucasian female with here for complaint of intermittent vaginal discharge and odor that had been occurring for over a month.  She was using coconut oil religiously.  She feels that the odor and discharge has improved.  She was having associated itching that is better as well.  Pt is also having some dysuria but has IC and states "this might be my IC".  She did have a urine culture done in December that was negative and she would like her urine tested in the office today.  Denies back pain, pelvic pain, fever, hematuria.  GYNECOLOGIC HISTORY: Patient's last menstrual period was 10/18/2001. Contraception: PMP  Patient Active Problem List   Diagnosis Date Noted  . Hearing loss of both ears 12/25/2014  . Spinal stenosis at L4-L5 level 08/09/2014  . Hyperlipidemia   . Hypertension   . Prediabetes   . Vitamin D deficiency   . GERD (gastroesophageal reflux disease) 11/17/2011  . Interstitial cystitis     Past Medical History:  Diagnosis Date  . Anxiety   . Arthritis    in neck  . Bursitis of right hip   . Carpal tunnel syndrome, bilateral    rt wrist  . Complication of anesthesia    "sore throat"  . Eagle's syndrome   . GERD (gastroesophageal reflux disease)   . Heart murmur    as child  . HNP (herniated nucleus pulposus)   . Interstitial cystitis   . Kidney cysts   . Osteopenia   . Plantar fasciitis   . Spinal stenosis   . Tingling    rt foot  . Wears hearing aid     Past Surgical History:  Procedure Laterality Date  . ABDOMINAL HYSTERECTOMY  2003   BSO  . ANAL SPHINCTEROTOMY    . CARPAL TUNNEL RELEASE     LEFT  . CESAREAN SECTION    . COLONOSCOPY     internal hemorrhoids  . cortisone injection     in neck  . CYSTOSCOPY    . [place]  . LUMBAR LAMINECTOMY    . LUMBAR LAMINECTOMY/DECOMPRESSION MICRODISCECTOMY N/A 08/09/2014   Procedure: MICRO LUMBAR DECOMPRESSION L3-4,  REDO DECOMPRESSION,  MICRODISCECTOMY L4-5, FORAMINOTOMY L3-L4, L4-L5 BILATERAL;  Surgeon: Susa Day, MD;  Location: [facility];  Service: Orthopedics;  Laterality: N/A;  . MYRINGOTOMY    . NASAL SINUS SURGERY    . TONSILLECTOMY     LEFT only, due to Eagle Syndrome  . TONSILLECTOMY  10/14   right side due to Eagles syndrome  . TRIGGER FINGER RELEASE     RIGHT  . TYMPANOSTOMY TUBE PLACEMENT      MEDS:  Reviewed in EPIC and UTD  ALLERGIES: Other; Codeine; and Sulfa antibiotics  Family History  Problem Relation Age of Onset  . Diabetes Mother   . Hypertension Mother   . Hyperlipidemia Mother   . Osteoporosis Mother   . Hypertension Brother   . Hyperlipidemia Brother   . Prostate cancer Father     Bone    SH:  Married, non smoker  Review of Systems  Gastrointestinal: Negative for constipation and diarrhea.  Genitourinary: Positive for dysuria (and history of IC). Negative for hematuria.  All other systems reviewed and are negative.   PHYSICAL EXAMINATION:    BP 124/86 (BP Location: Right Arm, Patient Position: Sitting, Cuff Size: Normal)   Pulse 70   Resp 14   Ht 4\' 11"  (1.499 m)  Wt 120 lb 9.6 oz (54.7 kg)   [name] 10/18/2001   BMI 24.36 kg/m     General appearance: alert, cooperative and appears stated age Abdomen: soft, non-tender; bowel sounds normal; no masses,  no organomegaly  Pelvic: External genitalia:  no lesions              Urethra:  normal appearing urethra with no masses, tenderness or lesions              Bartholins and Skenes: normal                 Vagina: normal appearing vagina with normal color and discharge, no lesions              Cervix: absent              Bimanual Exam:  Uterus:  uterus absent              Adnexa: no mass, fullness, tenderness              Rectovaginal: No.              Anus:  normal sphincter tone, no lesions, hemorrhoid 10 o'clock  Chaperone was present for exam.  Assessment: Vaginal discharge and itching that may be related to prior BV  infection in November vs   Plan: Affirm pending.  GC/Chl not obtained as pt declined having any new sexual partner and declined this testing No urine culture obtained today as dip ua was negative.

## 2016-02-29 ENCOUNTER — Telehealth: Payer: Self-pay | Admitting: Obstetrics & Gynecology

## 2016-02-29 LAB — WET PREP BY MOLECULAR PROBE
Candida species: NEGATIVE
Gardnerella vaginalis: POSITIVE — AB
Trichomonas vaginosis: NEGATIVE

## 2016-02-29 MED ORDER — FLUCONAZOLE 150 MG PO TABS: 150.0000 mg | ORAL_TABLET | Freq: Once | ORAL | 0 refills | Status: AC

## 2016-02-29 MED ORDER — METRONIDAZOLE 0.75 % VA GEL: 1.0000 | Freq: Every day | VAGINAL | 0 refills | Status: DC

## 2016-02-29 NOTE — Telephone Encounter (Signed)
Left message to call Kaitlyn at [phone]. 

## 2016-02-29 NOTE — Telephone Encounter (Signed)
Patient returning call.

## 2016-02-29 NOTE — Telephone Encounter (Signed)
Patient called to let Dr. Sabra Heck know she started a steroid dose pack for her knee this morning.  She also said, "I am expecting some results today after seeing Dr. Sabra Heck yesterday.  I wanted to let Dr. Sabra Heck know I am starting this in case she needs this information with respect to any medications I may need from my results."

## 2016-02-29 NOTE — Telephone Encounter (Signed)
Spoke with patient. Advised of message as seen below from Helen Glover. Patient is agreeable and verbalizes understanding. Rx for Metrogel place 1 applicator nightly for 5 days 0RF and diflucan 150 mg po x 1 repeat at the end of five days #2 0RF sent to pharmacy on file.  Routing to provider for final review. Patient agreeable to disposition. Will close encounter.

## 2016-02-29 NOTE — Telephone Encounter (Signed)
Routing to [place] for review. Affirm testing performed on 02/28/2016 has returned positive for BV. Okay to offer treatment with Flagyl or Metrogel? Anything additional for this patient?

## 2016-02-29 NOTE — Telephone Encounter (Signed)
Please let her know the affirm showed BV.  She can use either the metrogel [id number] one applicator full nightly x 5 nights or flagyl 500mg  bid x 7 days.  Needs diflucan 150mg  po x 1, repeat at end of five days (#2tabs/0RF) if uses metrogel.  Steroids are fine with this.

## 2016-03-14 ENCOUNTER — Telehealth: Payer: Self-pay | Admitting: Obstetrics & Gynecology

## 2016-03-14 NOTE — Telephone Encounter (Addendum)
Spoke with patient. Patient states she feels she has another bacterial infection. Patient states she was treated for BV and yeast 02/29/16, completed metrogel and diflucan x2.  Patient states towards end of last week she started having vaginal itching with odor again and white vaginal discharge. Patient states she thought this was yeast and treated with Monistat 3 day. Reports no longer has vaginal discharge. Patient states vaginal itching and burning has been more noticeable, with odor  last few days. Patient states she started using premarin cream 03/13/16. Patient denies any urinary complaints, fever or pain. Patient states would like to schedule with Dr. Sabra Heck on 03/17/16 if anything available. Patient scheduled for 03/17/16 at 4pm with Dr. Sabra Heck. Advised patient can use coconut oil externally for relief of itching. Patient verbalizes understanding and is agreeable to date and time.  Routing to provider for final review. Patient is agreeable to disposition. Will close encounter.

## 2016-03-14 NOTE — Telephone Encounter (Signed)
Patient thinks she has either BV or another yeast infection, would like to speak to a nurse about it.

## 2016-03-17 ENCOUNTER — Encounter: Payer: Self-pay | Admitting: Obstetrics & Gynecology

## 2016-03-17 ENCOUNTER — Ambulatory Visit (INDEPENDENT_AMBULATORY_CARE_PROVIDER_SITE_OTHER): Payer: 59 | Admitting: Obstetrics & Gynecology

## 2016-03-17 VITALS — BP 116/70 | HR 76 | Resp 12 | Ht 59.0 in | Wt 122.0 lb

## 2016-03-17 DIAGNOSIS — N9489 Other specified conditions associated with female genital organs and menstrual cycle: Secondary | ICD-10-CM

## 2016-03-17 DIAGNOSIS — N898 Other specified noninflammatory disorders of vagina: Secondary | ICD-10-CM | POA: Diagnosis not present

## 2016-03-17 DIAGNOSIS — N949 Unspecified condition associated with female genital organs and menstrual cycle: Secondary | ICD-10-CM | POA: Diagnosis not present

## 2016-03-17 NOTE — Progress Notes (Signed)
GYNECOLOGY  VISIT   HPI: 66 y.o. G50P1102 Married Caucasian female here for follow up of recurrent vaginal discharge and odor.  She was diagnosed with BV in November and then again 02/28/16.  After treatment with Metrogel in early January, pt reports symptoms resolved.  However, she started having increased discharge with itching a few days later.  She treated this with OTC Monistat.  Now she states she feels "raw".  She doesn't really have that much discharge.  She denies odor.  She is just uncomfortable.  She has stopped using the coconut oil.  She is still using Premarin vaginal cream but she is going to stop this as well until I can really help her sort this out and get her back to baseline.  Denies urinary symptoms.  No fever.  Patient's last menstrual period was 10/18/2001.    GYNECOLOGIC HISTORY: Patient's last menstrual period was 10/18/2001. Contraception: hysterectomy Menopausal hormone therapy: Minivelle 0.0375mg  patches  Patient Active Problem List   Diagnosis Date Noted  . Hearing loss of both ears 12/25/2014  . Spinal stenosis at L4-L5 level 08/09/2014  . Hyperlipidemia   . Hypertension   . Prediabetes   . Vitamin D deficiency   . GERD (gastroesophageal reflux disease) 11/17/2011  . Interstitial cystitis     Past Medical History:  Diagnosis Date  . Anxiety   . Arthritis    in neck  . Bursitis of right hip   . Carpal tunnel syndrome, bilateral    rt wrist  . Complication of anesthesia    "sore throat"  . Eagle's syndrome   . GERD (gastroesophageal reflux disease)   . Heart murmur    as child  . HNP (herniated nucleus pulposus)   . Interstitial cystitis   . Kidney cysts   . Osteopenia   . Plantar fasciitis   . Spinal stenosis   . Tingling    rt foot  . Wears hearing aid     Past Surgical History:  Procedure Laterality Date  . ABDOMINAL HYSTERECTOMY  2003   BSO  . ANAL SPHINCTEROTOMY    . CARPAL TUNNEL RELEASE     LEFT  . CESAREAN SECTION    .  COLONOSCOPY     internal hemorrhoids  . cortisone injection     in neck  . CYSTOSCOPY    . [place]  . LUMBAR LAMINECTOMY    . LUMBAR LAMINECTOMY/DECOMPRESSION MICRODISCECTOMY N/A 08/09/2014   Procedure: MICRO LUMBAR DECOMPRESSION L3-4,  REDO DECOMPRESSION, MICRODISCECTOMY L4-5, FORAMINOTOMY L3-L4, L4-L5 BILATERAL;  Surgeon: Susa Day, MD;  Location: [facility];  Service: Orthopedics;  Laterality: N/A;  . MYRINGOTOMY    . NASAL SINUS SURGERY    . TONSILLECTOMY     LEFT only, due to Eagle Syndrome  . TONSILLECTOMY  10/14   right side due to Eagles syndrome  . TRIGGER FINGER RELEASE     RIGHT  . TYMPANOSTOMY TUBE PLACEMENT      MEDS:  Reviewed in EPIC and UTD  ALLERGIES: Other; Codeine; and Sulfa antibiotics  Family History  Problem Relation Age of Onset  . Diabetes Mother   . Hypertension Mother   . Hyperlipidemia Mother   . Osteoporosis Mother   . Hypertension Brother   . Hyperlipidemia Brother   . Prostate cancer Father     Bone    SH:  Married, non smoker  Review of Systems  Genitourinary:       Vaginal burning   All other systems reviewed and  are negative.   PHYSICAL EXAMINATION:    BP 116/70 (BP Location: Right Arm, Patient Position: Sitting, Cuff Size: Normal)   Pulse 76   Resp 12   Ht 4\' 11"  (1.499 m)   Wt 122 lb (55.3 kg)   [name] 10/18/2001   BMI 24.64 kg/m     General appearance: alert, cooperative and appears stated age Abdomen: soft, non-tender; bowel sounds normal; no masses,  no organomegaly  Pelvic: External genitalia:  no lesions              Urethra:  normal appearing urethra with no masses, tenderness or lesions              Bartholins and Skenes: normal                 Vagina: normal appearing vagina with normal color and discharge, no lesions              Cervix: absent              Bimanual Exam:  Uterus:  uterus absent              Adnexa: no mass, fullness, tenderness              Rectovaginal: No.               Anus:  No lesions  Chaperone was present for exam.  Assessment: Vaginal burning, irritation, discharge Possible recurrent BV  Plan: D/w pt regimen for treatment for recurrent BV Also d/w pt using boric acid suppositories for treatment of BV this time to see if results will be better If affirm is negative, will use hydrocortisone suppositories Affirm pending as well as GC/Chl

## 2016-03-18 LAB — GC/CHLAMYDIA PROBE AMP
CT Probe RNA: NOT DETECTED
GC Probe RNA: NOT DETECTED

## 2016-03-18 LAB — WET PREP BY MOLECULAR PROBE
Candida species: NEGATIVE
Gardnerella vaginalis: POSITIVE — AB
Trichomonas vaginosis: NEGATIVE

## 2016-03-18 MED ORDER — NONFORMULARY OR COMPOUNDED ITEM: 0 refills | Status: DC

## 2016-03-18 NOTE — Addendum Note (Signed)
Addended by: Megan Salon on: 03/18/2016 05:56 PM   Modules accepted: Orders

## 2016-03-19 ENCOUNTER — Other Ambulatory Visit: Payer: Self-pay | Admitting: *Deleted

## 2016-03-19 ENCOUNTER — Telehealth: Payer: Self-pay | Admitting: Obstetrics & Gynecology

## 2016-03-19 NOTE — Telephone Encounter (Signed)
Patient left a voice mail on lunch time returning our call.

## 2016-03-19 NOTE — Telephone Encounter (Signed)
Rx faxed to pharmacy. Tried calling patient to notify, no answer.

## 2016-03-19 NOTE — Telephone Encounter (Signed)
Left message on patient's vm (per DPR) that prescription has been sent to pharmacy.

## 2016-03-19 NOTE — Telephone Encounter (Signed)
Patient calling to check on prescription for boric acid that was being sent to [facility]. She checked and it is not there. Pharmacy number is [phone].

## 2016-03-22 ENCOUNTER — Encounter: Payer: Self-pay | Admitting: Obstetrics & Gynecology

## 2016-03-27 ENCOUNTER — Encounter: Payer: Self-pay | Admitting: Obstetrics & Gynecology

## 2016-03-28 ENCOUNTER — Telehealth: Payer: Self-pay

## 2016-03-28 NOTE — Telephone Encounter (Signed)
Left message to call [place] at [phone].  Visit Follow-Up Question  Message [id number]  From JANSEN FAWBUSH To Megan Salon, MD Sent 03/27/2016 5:00 PM  I am continuing to use the boric acid suppositories, but if better, only a little...today seems worse than the last several days. Perhaps I need to give the treatment longer to work. I wondered if I should be doing anything else...someone mentioned probiotics to me. I've not used them before, but wondered if that is something I should try. You can let me know if I need to come back in next week if symptoms don't improve or give it more time. I don't mean to be a "regular customer..."   Helen Glover   Responsible Party   Pool - [facility] No one has taken responsibility for this message.  No actions have been taken on this message.

## 2016-03-28 NOTE — Telephone Encounter (Signed)
Left message to call Kaitlyn at [phone]. 

## 2016-03-31 NOTE — Telephone Encounter (Signed)
Spoke with patient. Patient states that her symptoms have decreased over the weekend. Is having mild intermittent internal burning and external itching. Denies any vaginal discharge. Patient is using boric acid nightly. Has started taking an oral probiotic Ultimate Flora orally daily. Patient has a follow up appointment scheduled with Dr.Miller on 05/02/2016. Asking if she needs to move this appointment forward or okay to keep using boric acid and monitor symptoms. Advised I will speak with Dr.Miller and return call with further recommendations. Patient is agreeable.

## 2016-04-01 NOTE — Telephone Encounter (Signed)
Please see telephone encounter dated 03/28/2016.

## 2016-04-02 NOTE — Telephone Encounter (Signed)
Spoke with patient. Advised I have spoken with Dr.Miller who recommends that the patient continue using Boric Acid capsules as it takes a while to adjust to the medication. If she is still having irritation she may use the Boric Acid every other night. May apply a small amount of Vaseline externally for irritation.  Patient is agreeable and verbalizes understanding. Aware if symptoms persist after an additional week of using the medication will need to call so her follow up appointment can be moved up. Patient is agreeable.  Routing to [place] for review before closing.

## 2016-04-09 ENCOUNTER — Telehealth: Payer: Self-pay | Admitting: Obstetrics & Gynecology

## 2016-04-09 DIAGNOSIS — M961 Postlaminectomy syndrome, not elsewhere classified: Secondary | ICD-10-CM | POA: Diagnosis not present

## 2016-04-09 DIAGNOSIS — M5136 Other intervertebral disc degeneration, lumbar region: Secondary | ICD-10-CM | POA: Diagnosis not present

## 2016-04-09 NOTE — Telephone Encounter (Signed)
[facility] to [facility] with [facility]. She states she is not doing any better.

## 2016-04-09 NOTE — Telephone Encounter (Signed)
Spoke with patient. Patient states that she is now using Boric Acid suppositories every other day and has been using Vaseline externally once a day for vaginal irritation as recommended by Dr.Miller (see note from 03/28/2016). States she is still having a lot of external vaginal irritation. Denies any vaginal discharge or itching. Reports some relief with use of vaseline, but feels her symptoms are not improving much. "I don't know if it is from the medicine or not. I have looked at the area and I am not sure what it is supposed to look like." Advised patient she will need to be seen in the office for further evaluation. Patient is agreeable. Appointment scheduled for 04/11/2016 at 8:30 am with Dr.Miller. Patient is agreeable to date and time.  Routing to provider for final review. Patient agreeable to disposition. Will close encounter.

## 2016-04-11 ENCOUNTER — Ambulatory Visit (INDEPENDENT_AMBULATORY_CARE_PROVIDER_SITE_OTHER): Payer: 59 | Admitting: Obstetrics & Gynecology

## 2016-04-11 ENCOUNTER — Encounter: Payer: Self-pay | Admitting: Obstetrics & Gynecology

## 2016-04-11 VITALS — BP 126/74 | HR 72 | Resp 14 | Wt 119.6 lb

## 2016-04-11 DIAGNOSIS — N9089 Other specified noninflammatory disorders of vulva and perineum: Secondary | ICD-10-CM | POA: Diagnosis not present

## 2016-04-11 MED ORDER — CLOBETASOL PROPIONATE 0.05 % EX OINT: 1.0000 "application " | TOPICAL_OINTMENT | Freq: Two times a day (BID) | CUTANEOUS | 0 refills | Status: DC

## 2016-04-11 NOTE — Progress Notes (Signed)
GYNECOLOGY  VISIT   HPI: 66 y.o. G63P1102 Married Caucasian female with recurrent BV symptoms.  Using boric acid for treatment.  Reports discharge is resolved and internal itching is resolved.  Now, she is having external itching/irritation that she notes when wearing her jeans or after urination.  Denies vaginal bleeding.  Going to [place] next week so wanted to be seen before going away for a few day.  Patient's last menstrual period was 10/18/2001.    GYNECOLOGIC HISTORY: Patient's last menstrual period was 10/18/2001. Contraception: hysterectomy Menopausal hormone therapy: vivelle dot  Patient Active Problem List   Diagnosis Date Noted  . Hearing loss of both ears 12/25/2014  . Spinal stenosis at L4-L5 level 08/09/2014  . Hyperlipidemia   . Hypertension   . Prediabetes   . Vitamin D deficiency   . GERD (gastroesophageal reflux disease) 11/17/2011  . Interstitial cystitis     Past Medical History:  Diagnosis Date  . Anxiety   . Arthritis    in neck  . Bursitis of right hip   . Carpal tunnel syndrome, bilateral    rt wrist  . Complication of anesthesia    "sore throat"  . Eagle's syndrome   . GERD (gastroesophageal reflux disease)   . Heart murmur    as child  . HNP (herniated nucleus pulposus)   . Interstitial cystitis   . Kidney cysts   . Osteopenia   . Plantar fasciitis   . Spinal stenosis   . Tingling    rt foot  . Wears hearing aid     Past Surgical History:  Procedure Laterality Date  . ABDOMINAL HYSTERECTOMY  2003   BSO  . ANAL SPHINCTEROTOMY    . CARPAL TUNNEL RELEASE     LEFT  . CESAREAN SECTION    . COLONOSCOPY     internal hemorrhoids  . cortisone injection     in neck  . CYSTOSCOPY    . [place]  . LUMBAR LAMINECTOMY    . LUMBAR LAMINECTOMY/DECOMPRESSION MICRODISCECTOMY N/A 08/09/2014   Procedure: MICRO LUMBAR DECOMPRESSION L3-4,  REDO DECOMPRESSION, MICRODISCECTOMY L4-5, FORAMINOTOMY L3-L4, L4-L5 BILATERAL;  Surgeon:  Susa Day, MD;  Location: [facility];  Service: Orthopedics;  Laterality: N/A;  . MYRINGOTOMY    . NASAL SINUS SURGERY    . TONSILLECTOMY     LEFT only, due to Eagle Syndrome  . TONSILLECTOMY  10/14   right side due to Eagles syndrome  . TRIGGER FINGER RELEASE     RIGHT  . TYMPANOSTOMY TUBE PLACEMENT      MEDS:  Reviewed in EPIC and UTD  ALLERGIES: Other; Codeine; and Sulfa antibiotics  Family History  Problem Relation Age of Onset  . Diabetes Mother   . Hypertension Mother   . Hyperlipidemia Mother   . Osteoporosis Mother   . Hypertension Brother   . Hyperlipidemia Brother   . Prostate cancer Father     Bone    SH:  Married, non smoker  Review of Systems  Genitourinary:       Vulvar irritation  All other systems reviewed and are negative.   PHYSICAL EXAMINATION:    BP 126/74 (BP Location: Right Arm, Patient Position: Sitting, Cuff Size: Normal)   Pulse 72   Resp 14   Wt 119 lb 9.6 oz (54.3 kg)   [name] 10/18/2001   BMI 24.16 kg/m     General appearance: alert, cooperative and appears stated age Inguinal:  No LAD  Pelvic: External genitalia:  Small area at top of labia majora where there is erythematous and thickened tissue c/w itching              Urethra:  normal appearing urethra with no masses, tenderness or lesions              Bartholins and Skenes: normal                 Vagina: normal appearing vagina with normal color and discharge, no lesions              Cervix: no lesions              Anus:  normal sphincter tone, small area of erythema at 12 o'clock, small external hemorrhoid  Chaperone was present for exam.  Assessment: Recurrent BV External irritation  Plan: Clobetasol 0.05% ointment bid topically. Pt will continue with boric acid suppositories every other day.  Recheck scheduled for 3/16.  Pt will stop the boric acid suppositories at least one week before her follow up appt.

## 2016-04-23 ENCOUNTER — Telehealth: Payer: Self-pay | Admitting: Obstetrics & Gynecology

## 2016-04-23 NOTE — Telephone Encounter (Signed)
If patient is having significant increase in the burning, she can come sooner for a recheck with Dr. Sabra Heck.  It is hard to know by phone if she should continue with the ointment or if she has a new issue such as yeast infection.   Cc- Dr. Sabra Heck

## 2016-04-23 NOTE — Telephone Encounter (Signed)
Spoke with patient. Patient states she was seen by Dr. Sabra Heck on 04/11/16 and started clobetasol ointment twice a day for vaginal itching and burning. Patient states she is still experiencing a little itching and more external burning, especially when urinating. Patient states she is using boric acid suppositories every other night and has one left. Patient states she is aware she is to stop using suppositories 1 week prior to f/u OV on 3/16. Patient states she took an Aveeno bath this morning and initially had relief from burning until urinating. Patient asking if she should stop clobetasol ointment until next OV? Advised patient Dr. Sabra Heck is out of the office today, will review with covering provider and return call with recommendations, patient is agreeable.  Dr. Quincy Simmonds, please advise?  Cc: Dr. Sabra Heck

## 2016-04-23 NOTE — Telephone Encounter (Signed)
Patient has a 6 week recheck 05/02/16. Patient is not "doing any better and is wondering if she needs to stop using the ointment until she returns to see Dr. Sabra Heck"?

## 2016-04-23 NOTE — Telephone Encounter (Signed)
Spoke with patient, advised as seen below per Dr. Quincy Simmonds. Patient scheduled for 04/24/16 at 10am with Dr. Sabra Heck. Patient is agreeable to date and time.  Routing to provider for final review. Patient is agreeable to disposition. Will close encounter.

## 2016-04-24 ENCOUNTER — Encounter: Payer: Self-pay | Admitting: Obstetrics & Gynecology

## 2016-04-24 ENCOUNTER — Ambulatory Visit (INDEPENDENT_AMBULATORY_CARE_PROVIDER_SITE_OTHER): Payer: 59 | Admitting: Obstetrics & Gynecology

## 2016-04-24 VITALS — BP 120/76 | HR 76 | Resp 16 | Ht 59.0 in | Wt 120.0 lb

## 2016-04-24 DIAGNOSIS — N898 Other specified noninflammatory disorders of vagina: Secondary | ICD-10-CM | POA: Diagnosis not present

## 2016-04-24 NOTE — Progress Notes (Signed)
GYNECOLOGY  VISIT   HPI: 66 y.o. G43P1102 Married Caucasian female here with recurrent BV symptoms.  She has been using boric acid suppositories.  She has stopped these because she has been feeling a little irritated.  It has been since Monday since she used the last one.  Denies any urinary symptoms.  Denies vaginal bleeding or back pain.  Reviewed with pt possible treatments for recurrent BV.  Questions answered.   GYNECOLOGIC HISTORY: Patient's last menstrual period was 10/18/2001. Contraception: PMP Menopausal hormone therapy: Minivelle 0.0375mg  patch twice weekly  Patient Active Problem List   Diagnosis Date Noted  . Hearing loss of both ears 12/25/2014  . Spinal stenosis at L4-L5 level 08/09/2014  . Hyperlipidemia   . Hypertension   . Prediabetes   . Vitamin D deficiency   . GERD (gastroesophageal reflux disease) 11/17/2011  . Interstitial cystitis     Past Medical History:  Diagnosis Date  . Anxiety   . Arthritis    in neck  . Bursitis of right hip   . Carpal tunnel syndrome, bilateral    rt wrist  . Complication of anesthesia    "sore throat"  . Eagle's syndrome   . GERD (gastroesophageal reflux disease)   . Heart murmur    as child  . HNP (herniated nucleus pulposus)   . Interstitial cystitis   . Kidney cysts   . Osteopenia   . Plantar fasciitis   . Spinal stenosis   . Tingling    rt foot  . Wears hearing aid     Past Surgical History:  Procedure Laterality Date  . ABDOMINAL HYSTERECTOMY  2003   BSO  . ANAL SPHINCTEROTOMY    . CARPAL TUNNEL RELEASE     LEFT  . CESAREAN SECTION    . COLONOSCOPY     internal hemorrhoids  . cortisone injection     in neck  . CYSTOSCOPY    . [place]  . LUMBAR LAMINECTOMY    . LUMBAR LAMINECTOMY/DECOMPRESSION MICRODISCECTOMY N/A 08/09/2014   Procedure: MICRO LUMBAR DECOMPRESSION L3-4,  REDO DECOMPRESSION, MICRODISCECTOMY L4-5, FORAMINOTOMY L3-L4, L4-L5 BILATERAL;  Surgeon: Susa Day, MD;   Location: [facility];  Service: Orthopedics;  Laterality: N/A;  . MYRINGOTOMY    . NASAL SINUS SURGERY    . TONSILLECTOMY     LEFT only, due to Eagle Syndrome  . TONSILLECTOMY  10/14   right side due to Eagles syndrome  . TRIGGER FINGER RELEASE     RIGHT  . TYMPANOSTOMY TUBE PLACEMENT      MEDS:  Reviewed in EPIC and UTD  ALLERGIES: Other; Codeine; and Sulfa antibiotics  Family History  Problem Relation Age of Onset  . Diabetes Mother   . Hypertension Mother   . Hyperlipidemia Mother   . Osteoporosis Mother   . Hypertension Brother   . Hyperlipidemia Brother   . Prostate cancer Father     Bone    SH:  Married, non smoker  Review of Systems  All other systems reviewed and are negative.   PHYSICAL EXAMINATION:    BP 120/76 (BP Location: Right Arm, Patient Position: Sitting, Cuff Size: Normal)   Pulse 76   Resp 16   Ht 4\' 11"  (1.499 m)   Wt 120 lb (54.4 kg)   [name] 10/18/2001   BMI 24.24 kg/m     General appearance: alert, cooperative and appears stated age Abdomen: soft, non-tender; bowel sounds normal; no masses,  no organomegaly  Pelvic: External genitalia:  no lesions              Urethra:  normal appearing urethra with no masses, tenderness or lesions              Bartholins and Skenes: normal                 Vagina: normal appearing vagina with normal color and discharge, no lesions              Cervix: absent              Bimanual Exam:  Uterus:  uterus absent              Adnexa: no mass, fullness, tenderness              Anus:  no lesions  Chaperone was present for exam.  Assessment: Vaginal/vulvar burning H/O BV x 3 within last few months  Plan: Affirm pending.  If BV is present, will treat and then start treatment for chronic BV.

## 2016-04-25 ENCOUNTER — Other Ambulatory Visit: Payer: Self-pay | Admitting: Obstetrics & Gynecology

## 2016-04-25 ENCOUNTER — Encounter: Payer: Self-pay | Admitting: Obstetrics & Gynecology

## 2016-04-25 LAB — WET PREP BY MOLECULAR PROBE
Candida species: NOT DETECTED
Gardnerella vaginalis: DETECTED — AB
Trichomonas vaginosis: NOT DETECTED

## 2016-04-25 MED ORDER — METRONIDAZOLE 500 MG PO TABS
500.0000 mg | ORAL_TABLET | Freq: Two times a day (BID) | ORAL | 0 refills | Status: DC
Start: 2016-04-25 — End: 2016-05-02

## 2016-04-25 MED ORDER — METRONIDAZOLE 0.75 % VA GEL: VAGINAL | 3 refills | Status: DC

## 2016-04-29 NOTE — Telephone Encounter (Signed)
Patient called in and scheduled an appointment for a three month recheck on 07/24/16 with Dr. Sabra Heck.

## 2016-04-30 ENCOUNTER — Encounter: Payer: Self-pay | Admitting: Obstetrics & Gynecology

## 2016-04-30 ENCOUNTER — Telehealth: Payer: Self-pay | Admitting: *Deleted

## 2016-04-30 NOTE — Telephone Encounter (Signed)
Spoke with patient. Patient states she will finish oral flagyl tomorrow and symptoms are not better or worse. Patient reports vagina feels like sandpaper. Patient reports she will start Metrogel Sunday night for a Sun/Thurs schedule. Patient states she will give medication through the weekend to see if oral flagyl helps and will return call to office for scheduling OV if symptoms are not better or they have gotten worse. Advised patient RN would update Dr. Sabra Heck and return call with any additional recommendations, patient is agreeable.  Routing to provider for final review. Patient is agreeable to disposition. Will close encounter.   From Tedra [place] To Megan Salon, MD Sent 04/30/2016 7:26 AM  I have two days left of my antibiotic for the BV and haven't had very much symptom relief...a little bit. If this is to be expected, that's fine, but want to be sure I shouldn't feel symptom free prior to waiting from tomorrow until Sunday to begin the vaginal treatment that I am to do twice a week for 3 months. I thought I'd do Sunday and Thursday nights. If I am not symptom free or lots better, should I begin the vaginal gel on Friday night? I could do Friday and Monday nights....   Thanks so much for feedback,  Helen Glover

## 2016-04-30 NOTE — Telephone Encounter (Addendum)
See telephone encounter dated 04/30/16.

## 2016-05-02 ENCOUNTER — Ambulatory Visit: Payer: 59 | Admitting: Obstetrics & Gynecology

## 2016-05-02 ENCOUNTER — Encounter: Payer: Self-pay | Admitting: Obstetrics & Gynecology

## 2016-05-02 ENCOUNTER — Ambulatory Visit (INDEPENDENT_AMBULATORY_CARE_PROVIDER_SITE_OTHER): Payer: 59 | Admitting: Obstetrics & Gynecology

## 2016-05-02 VITALS — BP 118/70 | HR 62 | Resp 12 | Ht 59.0 in | Wt 120.0 lb

## 2016-05-02 DIAGNOSIS — N760 Acute vaginitis: Secondary | ICD-10-CM

## 2016-05-02 NOTE — Progress Notes (Signed)
GYNECOLOGY  VISIT   HPI: 66 y.o. G22P1102 Married Caucasian female here with hx of recurrent BV who now has a small, tender "bump" on her vulva that she would like me to look at today.  Denies bleeding or drainage.  Denies fever.  Has just finished the oral flagyl.  Still feeling some irritation.  Is going to start using metrogel pv twice weekly.  Has rx already.  D/W pt retesting with sureswab today to ensure BV is currently gone.  Pt comfortable proceeding.  GYNECOLOGIC HISTORY: Patient's last menstrual period was 10/18/2001. Contraception: hysterectomy Menopausal hormone therapy: Minivelle 0.0375mg  patch twice weekly  Patient Active Problem List   Diagnosis Date Noted  . Hearing loss of both ears 12/25/2014  . Spinal stenosis at L4-L5 level 08/09/2014  . Hyperlipidemia   . Hypertension   . Prediabetes   . Vitamin D deficiency   . GERD (gastroesophageal reflux disease) 11/17/2011  . Interstitial cystitis     Past Medical History:  Diagnosis Date  . Anxiety   . Arthritis    in neck  . Bursitis of right hip   . Carpal tunnel syndrome, bilateral    rt wrist  . Complication of anesthesia    "sore throat"  . Eagle's syndrome   . GERD (gastroesophageal reflux disease)   . Heart murmur    as child  . HNP (herniated nucleus pulposus)   . Interstitial cystitis   . Kidney cysts   . Osteopenia   . Plantar fasciitis   . Spinal stenosis   . Tingling    rt foot  . Wears hearing aid     Past Surgical History:  Procedure Laterality Date  . ABDOMINAL HYSTERECTOMY  2003   BSO  . ANAL SPHINCTEROTOMY    . CARPAL TUNNEL RELEASE     LEFT  . CESAREAN SECTION    . COLONOSCOPY     internal hemorrhoids  . cortisone injection     in neck  . CYSTOSCOPY    . [place]  . LUMBAR LAMINECTOMY    . LUMBAR LAMINECTOMY/DECOMPRESSION MICRODISCECTOMY N/A 08/09/2014   Procedure: MICRO LUMBAR DECOMPRESSION L3-4,  REDO DECOMPRESSION, MICRODISCECTOMY L4-5, FORAMINOTOMY L3-L4,  L4-L5 BILATERAL;  Surgeon: Susa Day, MD;  Location: [facility];  Service: Orthopedics;  Laterality: N/A;  . MYRINGOTOMY    . NASAL SINUS SURGERY    . TONSILLECTOMY     LEFT only, due to Eagle Syndrome  . TONSILLECTOMY  10/14   right side due to Eagles syndrome  . TRIGGER FINGER RELEASE     RIGHT  . TYMPANOSTOMY TUBE PLACEMENT      MEDS:  Reviewed in EPIC and UTD  ALLERGIES: Other; Codeine; and Sulfa antibiotics  Family History  Problem Relation Age of Onset  . Diabetes Mother   . Hypertension Mother   . Hyperlipidemia Mother   . Osteoporosis Mother   . Hypertension Brother   . Hyperlipidemia Brother   . Prostate cancer Father     Bone    SH:  Married, non smoker  Review of Systems  All other systems reviewed and are negative.   PHYSICAL EXAMINATION:    BP 118/70 (BP Location: Right Arm, Patient Position: Sitting, Cuff Size: Normal)   Pulse 62   Resp 12   Ht 4\' 11"  (1.499 m)   Wt 120 lb (54.4 kg)   [name] 10/18/2001   BMI 24.24 kg/m     Physical Exam  Constitutional: She appears well-nourished.  GI: Soft. Bowel  sounds are normal.  Genitourinary: Vagina normal.    There is no rash, tenderness, lesion or injury on the right labia. There is no rash, tenderness, lesion or injury on the left labia.  Genitourinary Comments: Cervix and uterus absent.  Vagina without lesions or changes.    Lymphadenopathy:       Right: No inguinal adenopathy present.       Left: No inguinal adenopathy present.  Skin: Skin is warm and dry.  Psychiatric: She has a normal mood and affect.    Chaperone was present for exam.  Assessment: Recurrent BV Current vulvar burning Small sebaceous gland inflammation  Plan: Sureswab vaginitis/vaginosis obtained Pt will start metrogel pv twice weekly on Sunday Topical antibiotic ointment to be applied to small lesion Will follow up and make additional recommendations once sureswab is back

## 2016-05-02 NOTE — Telephone Encounter (Signed)
Patient called back with additional concerns about her symptoms and requested an appointment with Dr. Sabra Heck today.  She is coming in at 9:45 AM for a 10:00 AM appointment. Patient is appreciative.

## 2016-05-02 NOTE — Telephone Encounter (Signed)
Patient is scheduled for OV 05/02/16 with Dr. Sabra Heck. Will close encounter.

## 2016-05-05 LAB — SURESWAB BACTERIAL VAGINOSIS/ITIS
Atopobium vaginae: NOT DETECTED Log (cells/mL)
C. albicans, DNA: NOT DETECTED
C. glabrata, DNA: NOT DETECTED
C. parapsilosis, DNA: NOT DETECTED
C. tropicalis, DNA: NOT DETECTED
Gardnerella vaginalis: >8 Log (cells/mL)
LACTOBACILLUS SPECIES: NOT DETECTED Log (cells/mL)
MEGASPHAERA SPECIES: NOT DETECTED Log (cells/mL)
T. vaginalis RNA, QL TMA: NOT DETECTED

## 2016-05-06 ENCOUNTER — Telehealth: Payer: Self-pay | Admitting: Obstetrics & Gynecology

## 2016-05-06 MED ORDER — FLUCONAZOLE 150 MG PO TABS: 150.0000 mg | ORAL_TABLET | Freq: Once | ORAL | 3 refills | Status: AC

## 2016-05-06 NOTE — Telephone Encounter (Signed)
Left message to call Kaitlyn at [phone]. 

## 2016-05-06 NOTE — Telephone Encounter (Signed)
I spoke with her personally.  Rx for diflucan sent to pharmacy.  RFs also given as she is going to use metrogel, one applicator pv twice weekly for next several months due to recurrent BV.  Recent sureswab results given to pt as well over the phone.  Ok to close encounter.

## 2016-05-06 NOTE — Telephone Encounter (Signed)
Spoke with patient. Patient was seen on 05/02/2016 for recurrent vaginitis with Dr.Miller. Patient states that she started Metrogel 1 applicator vaginally on Sunday 05/04/2016. Reports she woke up this morning and is having thick white discharge with itching. Feels she has a yeast infection from ongoing treatment of BV. Asking for medication for yeast as she will be going out of town on 05/08/2016. Advised will review with Dr.Miller and return call with further recommendations. Patient is agreeable.  Dr.Miller, okay for rx for Diflucan 150 mg po x 1, repeat in 72 hours if symptoms persist?

## 2016-05-06 NOTE — Telephone Encounter (Signed)
Patient returning your call.

## 2016-05-06 NOTE — Telephone Encounter (Signed)
Patient has a question for the nurse °

## 2016-05-06 NOTE — Telephone Encounter (Signed)
Routing to you as I'm not sure I did it the first time.  Ok to close encounter.

## 2016-05-13 ENCOUNTER — Encounter: Payer: Self-pay | Admitting: Obstetrics & Gynecology

## 2016-05-19 ENCOUNTER — Encounter: Payer: Self-pay | Admitting: Obstetrics & Gynecology

## 2016-05-19 ENCOUNTER — Ambulatory Visit (INDEPENDENT_AMBULATORY_CARE_PROVIDER_SITE_OTHER): Payer: Medicare Other | Admitting: Obstetrics & Gynecology

## 2016-05-19 VITALS — BP 120/80 | HR 62 | Resp 12 | Ht 59.0 in | Wt 122.0 lb

## 2016-05-19 DIAGNOSIS — N760 Acute vaginitis: Secondary | ICD-10-CM

## 2016-05-19 DIAGNOSIS — B3731 Acute candidiasis of vulva and vagina: Secondary | ICD-10-CM

## 2016-05-19 DIAGNOSIS — B9689 Other specified bacterial agents as the cause of diseases classified elsewhere: Secondary | ICD-10-CM | POA: Diagnosis not present

## 2016-05-19 DIAGNOSIS — N898 Other specified noninflammatory disorders of vagina: Secondary | ICD-10-CM

## 2016-05-19 DIAGNOSIS — L298 Other pruritus: Secondary | ICD-10-CM | POA: Diagnosis not present

## 2016-05-19 DIAGNOSIS — B373 Candidiasis of vulva and vagina: Secondary | ICD-10-CM | POA: Diagnosis not present

## 2016-05-19 MED ORDER — TERCONAZOLE 0.4 % VA CREA: 1.0000 | TOPICAL_CREAM | Freq: Every day | VAGINAL | 0 refills | Status: DC

## 2016-05-19 NOTE — Progress Notes (Signed)
GYNECOLOGY  VISIT   HPI: 66 y.o. G24P1102 Married Caucasian female here for complaint of vaginal itching.  She has been diagnosed with recurrent or persistent BV over the past several months.    She's had several Affirm tests that have been positive for BV.  12/25/15.  Positive for BV.  Treated with flagyl 500mg  bid x 7 days.  (Done with PCP). 03/06/15.  Wet prep + for yeast.  Saw CNM, Debbi Hollice Espy.  Treated with Mycolog and premarin vaginal cream 02/05/16.  Still with vaginal burning sensation.  Saw Debbi Hollice Espy again.  Wet prep negative.  Advised coconut oil, Aveeno bath, and to continue Premarin cream. 02/28/16.  Complaint of intermittent vaginal discharge and odor.  Affirm positive for BV.  Treated with Metrogel and diflucan if has resultant yeast from Metrogel.  Pt thought she had yeast at the end of the treatment so used OTC monistat x 3 days. 03/17/16.  Complaint of vaginal discharge and odor.  Affirm positive for BV.  GC/Chl was negative.  Started boric acid suppositories for treatment.   04/11/16.  Itching and discharge was resolve but she was having some vulvar irritation and had external irritation on examination.  Topical clobetasol was started.   04/21/16.  She stopped the boric acid on her own. 04/24/16.  Office visit for recurrent symptoms after she stopped the boric acids.  Affirm positive for BV.  Treated with flagyl 500mg  bid x 7 days then started on suppresive dosing of metrogel twice weekly. 05/02/16.  Repeating testing with sureswab after flagyl treatment showed BV still present.  Gardnerella vaginalis noted.  When report was given, pt still felt like she had yeast and was treated with Diflucan. Today--pt being seen as she still has itching.  She's now taking four diflucan without improvement.    Denies vaginal bleeding.  Not SA.  Not using any other products on the vulva or vaginally.  H interstitial cystitis but feels she is doing well from that standpoint.    GYNECOLOGIC  HISTORY: Patient's last menstrual period was 10/18/2001. Contraception: hysterectomy Menopausal hormone therapy: Minivelle 0.0375mg  twice weekly  Patient Active Problem List   Diagnosis Date Noted  . Hearing loss of both ears 12/25/2014  . Spinal stenosis at L4-L5 level 08/09/2014  . Hyperlipidemia   . Hypertension   . Prediabetes   . Vitamin D deficiency   . GERD (gastroesophageal reflux disease) 11/17/2011  . Interstitial cystitis     Past Medical History:  Diagnosis Date  . Anxiety   . Arthritis    in neck  . Bursitis of right hip   . Carpal tunnel syndrome, bilateral    rt wrist  . Complication of anesthesia    "sore throat"  . Eagle's syndrome   . GERD (gastroesophageal reflux disease)   . Heart murmur    as child  . HNP (herniated nucleus pulposus)   . Interstitial cystitis   . Kidney cysts   . Osteopenia   . Plantar fasciitis   . Spinal stenosis   . Tingling    rt foot  . Wears hearing aid     Past Surgical History:  Procedure Laterality Date  . ABDOMINAL HYSTERECTOMY  2003   BSO  . ANAL SPHINCTEROTOMY    . CARPAL TUNNEL RELEASE     LEFT  . CESAREAN SECTION    . COLONOSCOPY     internal hemorrhoids  . cortisone injection     in neck  . CYSTOSCOPY    . HEMORRHOID  SURGERY N/A 1998  . LUMBAR LAMINECTOMY    . LUMBAR LAMINECTOMY/DECOMPRESSION MICRODISCECTOMY N/A 08/09/2014   Procedure: MICRO LUMBAR DECOMPRESSION L3-4,  REDO DECOMPRESSION, MICRODISCECTOMY L4-5, FORAMINOTOMY L3-L4, L4-L5 BILATERAL;  Surgeon: Susa Day, MD;  Location: [facility];  Service: Orthopedics;  Laterality: N/A;  . MYRINGOTOMY    . NASAL SINUS SURGERY    . TONSILLECTOMY     LEFT only, due to Eagle Syndrome  . TONSILLECTOMY  10/14   right side due to Eagles syndrome  . TRIGGER FINGER RELEASE     RIGHT  . TYMPANOSTOMY TUBE PLACEMENT      MEDS:  Reviewed in EPIC and UTD  ALLERGIES: Other; Codeine; and Sulfa antibiotics  Family History  Problem Relation Age of Onset  .  Diabetes Mother   . Hypertension Mother   . Hyperlipidemia Mother   . Osteoporosis Mother   . Hypertension Brother   . Hyperlipidemia Brother   . Prostate cancer Father     Bone    SH:  Married, non smoker  Review of Systems  Genitourinary:       Vaginal irritation  All other systems reviewed and are negative.   PHYSICAL EXAMINATION:    BP 120/80 (BP Location: Right Arm, Patient Position: Sitting, Cuff Size: Small)   Pulse 62   Resp 12   Ht 4\' 11"  (1.499 m)   Wt 122 lb (55.3 kg)   [name] 10/18/2001   BMI 24.64 kg/m     General appearance: alert, cooperative and appears stated age Abdomen: soft, non-tender; bowel sounds normal; no masses,  no organomegaly  Pelvic: External genitalia:  no lesions              Urethra:  normal appearing urethra with no masses, tenderness or lesions              Bartholins and Skenes: normal                 Vagina: normal appearing vagina with normal color and discharge, no lesions              Cervix: absent              Bimanual Exam:  Uterus:  uterus absent              Adnexa: no mass, fullness, tenderness               Anus:  no lesions  Chaperone was present for exam.  Assessment: Recurrent BV Vaginal irritation  Plan: Repeat sureswab vaginitis/osis obtained today.  Likely BV still present.  Consider referral to specialist as I cannot seem to get this under control.  Pt comfortable with this.

## 2016-05-22 ENCOUNTER — Other Ambulatory Visit: Payer: Self-pay | Admitting: *Deleted

## 2016-05-22 ENCOUNTER — Telehealth: Payer: Self-pay | Admitting: *Deleted

## 2016-05-22 LAB — SURESWAB BACTERIAL VAGINOSIS/ITIS
Atopobium vaginae: NOT DETECTED Log (cells/mL)
C. albicans, DNA: NOT DETECTED
C. glabrata, DNA: NOT DETECTED
C. parapsilosis, DNA: DETECTED — AB
C. tropicalis, DNA: NOT DETECTED
Gardnerella vaginalis: 7.5 Log (cells/mL)
LACTOBACILLUS SPECIES: NOT DETECTED Log (cells/mL)
MEGASPHAERA SPECIES: NOT DETECTED Log (cells/mL)
T. vaginalis RNA, QL TMA: NOT DETECTED

## 2016-05-22 MED ORDER — TINIDAZOLE 500 MG PO TABS: 500.0000 mg | ORAL_TABLET | Freq: Two times a day (BID) | ORAL | 0 refills | Status: AC

## 2016-05-22 NOTE — Telephone Encounter (Signed)
Spoke with patient, advised as seen below per Dr. Quincy Simmonds. Patient thankful for return call and verbalizes understanding.  Routing to provider for final review. Patient is agreeable to disposition. Will close encounter.  Cc: Dr. Sabra Heck

## 2016-05-22 NOTE — Telephone Encounter (Signed)
Patient calling about RX tinidazole. Patient states she reviewed medication online, side effects and dosing. Patient states she is concerned with taking it twice a day when it is recommended once a day for 3-5 days. Patient would like to confirm that this is correct. Advised patient per Dr. Ammie Ferrier recommendation, bid x7 days. Advised patient Dr. Sabra Heck is out of the office for the afternoon, can review with covering provider and return call. Patient is agreeable.  Dr. Quincy Simmonds, can you advise on Tinidazole?   Cc: Dr. Sabra Heck    Notes recorded by Megan Salon, MD on 05/22/2016 at 12:07 PM EDT Please inform pt that [place] still present. Would like to try a different medication for her. Tinidazole 500mg  bid x 7 days. This may be more expensive as it is a newer medication. I'd like to see her two or three days after she has finished the oral medication to retest for BV to see if it is gone. Ok to call in RX to pharmacy.

## 2016-05-22 NOTE — Telephone Encounter (Signed)
OK for Tindamax 500 mg po bid for 7 days for recurrence of bacterial vaginosis.  This is a different regimen than used for simple bacterial vaginosis infections.

## 2016-05-23 ENCOUNTER — Telehealth: Payer: Self-pay | Admitting: *Deleted

## 2016-05-23 NOTE — Telephone Encounter (Signed)
Geni Bers from [facility] calling on patients behalf. Was advised patient received Shingrix vaccine on 05/22/16, was concerned of interactions with starting tinidazole. Advised pharmacy no drug interactions noted when comparing on up to date. Pharmacy asking to clarify ok to start medication within 24 hours of vaccine? Advised will review with provider and return call.   Dr. Sabra Heck -ok to start tinidazole today?

## 2016-05-23 NOTE — Telephone Encounter (Signed)
Yes.  It is ok to start the Tinidazole today.  Please let her know.

## 2016-05-23 NOTE — Telephone Encounter (Signed)
Spoke with patient, advised as seen below per Dr. Sabra Heck. Patient states she started the medication last night, just wanted clarification. Patient verbalizes understanding.  Routing to provider for final review. Patient is agreeable to disposition. Will close encounter.

## 2016-05-27 ENCOUNTER — Encounter: Payer: Self-pay | Admitting: Obstetrics & Gynecology

## 2016-05-27 DIAGNOSIS — M5136 Other intervertebral disc degeneration, lumbar region: Secondary | ICD-10-CM | POA: Diagnosis not present

## 2016-05-27 DIAGNOSIS — M961 Postlaminectomy syndrome, not elsewhere classified: Secondary | ICD-10-CM | POA: Diagnosis not present

## 2016-05-27 DIAGNOSIS — Z4789 Encounter for other orthopedic aftercare: Secondary | ICD-10-CM | POA: Diagnosis not present

## 2016-05-28 NOTE — Telephone Encounter (Addendum)
Patient called in and took the slot with Dr. Sabra Heck on 05/29/16 at 2:30 PM. She has additional questions for the nurse about the current antibiotics she is taking and requests a call back at: [phone].

## 2016-05-29 ENCOUNTER — Ambulatory Visit (INDEPENDENT_AMBULATORY_CARE_PROVIDER_SITE_OTHER): Payer: Medicare Other | Admitting: Obstetrics & Gynecology

## 2016-05-29 ENCOUNTER — Encounter: Payer: Self-pay | Admitting: Obstetrics & Gynecology

## 2016-05-29 VITALS — BP 122/64 | HR 80 | Resp 16 | Ht 59.0 in | Wt 120.0 lb

## 2016-05-29 DIAGNOSIS — N949 Unspecified condition associated with female genital organs and menstrual cycle: Secondary | ICD-10-CM

## 2016-05-29 MED ORDER — HYDROCORTISONE ACETATE 25 MG RE SUPP: RECTAL | 0 refills | Status: DC

## 2016-05-29 NOTE — Progress Notes (Signed)
GYNECOLOGY  VISIT   HPI: 66 y.o. G56P1102 Married Caucasian female here for follow up of persistent BV.  She was changed to Tindamax 500mg  twice daily for 6 days.  She did not finish the last two.  She continues to have vaginal burning and irritation.  She states she has stopped swimming and started wearing cotton underwear.    Denies urinary symptoms.  Not SA.  GYNECOLOGIC HISTORY: Patient's last menstrual period was 10/18/2001. Contraception: hysterectomy Menopausal hormone therapy: vivelle dot  Patient Active Problem List   Diagnosis Date Noted  . Hearing loss of both ears 12/25/2014  . Spinal stenosis at L4-L5 level 08/09/2014  . Hyperlipidemia   . Hypertension   . Prediabetes   . Vitamin D deficiency   . GERD (gastroesophageal reflux disease) 11/17/2011  . Interstitial cystitis     Past Medical History:  Diagnosis Date  . Anxiety   . Arthritis    in neck  . Bursitis of right hip   . Carpal tunnel syndrome, bilateral    rt wrist  . Complication of anesthesia    "sore throat"  . Eagle's syndrome   . GERD (gastroesophageal reflux disease)   . Heart murmur    as child  . HNP (herniated nucleus pulposus)   . Interstitial cystitis   . Kidney cysts   . Osteopenia   . Plantar fasciitis   . Spinal stenosis   . Tingling    rt foot  . Wears hearing aid     Past Surgical History:  Procedure Laterality Date  . ABDOMINAL HYSTERECTOMY  2003   BSO  . ANAL SPHINCTEROTOMY    . CARPAL TUNNEL RELEASE     LEFT  . CESAREAN SECTION    . COLONOSCOPY     internal hemorrhoids  . cortisone injection     in neck  . CYSTOSCOPY    . [place]  . LUMBAR LAMINECTOMY    . LUMBAR LAMINECTOMY/DECOMPRESSION MICRODISCECTOMY N/A 08/09/2014   Procedure: MICRO LUMBAR DECOMPRESSION L3-4,  REDO DECOMPRESSION, MICRODISCECTOMY L4-5, FORAMINOTOMY L3-L4, L4-L5 BILATERAL;  Surgeon: Susa Day, MD;  Location: [facility];  Service: Orthopedics;  Laterality: N/A;  . MYRINGOTOMY     . NASAL SINUS SURGERY    . TONSILLECTOMY     LEFT only, due to Eagle Syndrome  . TONSILLECTOMY  10/14   right side due to Eagles syndrome  . TRIGGER FINGER RELEASE     RIGHT  . TYMPANOSTOMY TUBE PLACEMENT      MEDS:  Reviewed in EPIC and UTD  ALLERGIES: Other; Codeine; and Sulfa antibiotics  Family History  Problem Relation Age of Onset  . Diabetes Mother   . Hypertension Mother   . Hyperlipidemia Mother   . Osteoporosis Mother   . Hypertension Brother   . Hyperlipidemia Brother   . Prostate cancer Father     Bone    SH:  Married, non smoker  Review of Systems  All other systems reviewed and are negative.   PHYSICAL EXAMINATION:    BP 122/64 (BP Location: Right Arm, Patient Position: Sitting, Cuff Size: Normal)   Pulse 80   Resp 16   Ht 4\' 11"  (1.499 m)   Wt 120 lb (54.4 kg)   [name] 10/18/2001   BMI 24.24 kg/m     General appearance: alert, cooperative and appears stated age Abdomen: soft, non-tender; bowel sounds normal; no masses,  no organomegaly  Pelvic: External genitalia:  no lesions  Urethra:  normal appearing urethra with no masses, tenderness or lesions              Bartholins and Skenes: normal                 Vagina: normal appearing vagina with normal color and discharge, no lesions              Cervix: absent              Bimanual Exam:  Uterus:  uterus absent              Adnexa: no mass, fullness, tenderness              Anus:  no lesions  Chaperone was present for exam.  Assessment: Persistent BV Vaginal burning  Plan: Vaginal culture obtained sureswab vaginitis/osis obtained Will try vaginal hydrocortisone suppositories 25mg  nightly for symptom relief while waiting on results

## 2016-06-01 LAB — SURESWAB BACTERIAL VAGINOSIS/ITIS
Atopobium vaginae: NOT DETECTED Log (cells/mL)
C. albicans, DNA: NOT DETECTED
C. glabrata, DNA: NOT DETECTED
C. parapsilosis, DNA: NOT DETECTED
C. tropicalis, DNA: NOT DETECTED
Gardnerella vaginalis: 6.6 Log (cells/mL)
LACTOBACILLUS SPECIES: NOT DETECTED Log (cells/mL)
MEGASPHAERA SPECIES: NOT DETECTED Log (cells/mL)
T. vaginalis RNA, QL TMA: NOT DETECTED

## 2016-06-02 ENCOUNTER — Ambulatory Visit: Payer: Self-pay | Admitting: Obstetrics & Gynecology

## 2016-06-06 LAB — CULTURE, ROUTINE-GENITAL: Organism ID, Bacteria: NORMAL

## 2016-06-09 ENCOUNTER — Telehealth: Payer: Self-pay | Admitting: Obstetrics & Gynecology

## 2016-06-09 NOTE — Telephone Encounter (Signed)
Spoke with patient. Patient states that she is calling to check on the status of the remainder of her testing results from 05/29/2016. States she was contacted by dermatologist in [place], but was waiting to get all results before scheduling. Advised will review with Dr.Miller and return call. Patient is agreeable.

## 2016-06-09 NOTE — Telephone Encounter (Signed)
Patient wants to speak with the nurse no information given. °

## 2016-06-09 NOTE — Telephone Encounter (Signed)
Left message to call Kaitlyn at [phone]. 

## 2016-06-09 NOTE — Telephone Encounter (Signed)
Patient returning your call.

## 2016-06-10 ENCOUNTER — Other Ambulatory Visit: Payer: Self-pay | Admitting: Obstetrics & Gynecology

## 2016-06-10 NOTE — Telephone Encounter (Signed)
Patient is returning a call to Kaitlyn. °

## 2016-06-10 NOTE — Telephone Encounter (Signed)
Attempted to reach patient at both [phone] and [phone] okay per ROI. Patient did not answer at either number and voicemail was not available as phone rang and rang.

## 2016-06-10 NOTE — Telephone Encounter (Signed)
Routing to Dr.Miller for review and advise. 

## 2016-06-10 NOTE — Telephone Encounter (Signed)
Called pt personally.  Reviewed results.  She is going to resume suppressive therapy for BV with metrogel 5.03% cream, one applicator full two nights weekly for 3 months.  Will plan to repeat the sureswap in 3 months.  Rx sent to pharmacy.  Ok to close encounter.

## 2016-06-13 NOTE — Telephone Encounter (Signed)
Patient is scheduled for 3 month follow up on 09/09/2016 at 4 pm with Dr.Miller.  Routing to provider for final review. Patient agreeable to disposition. Will close encounter.

## 2016-06-21 ENCOUNTER — Encounter: Payer: Self-pay | Admitting: Obstetrics & Gynecology

## 2016-06-30 ENCOUNTER — Telehealth: Payer: Self-pay | Admitting: Obstetrics & Gynecology

## 2016-06-30 NOTE — Telephone Encounter (Signed)
Pharmacy called again about this patient.

## 2016-06-30 NOTE — Telephone Encounter (Signed)
Patient's pharmacy is calling to check with a clinical staff member about a prior authorization for [place] patch.

## 2016-06-30 NOTE — Telephone Encounter (Addendum)
PA has not been received from patient's pharmacy. PA completed via Cover My Meds for this patient on 06/30/2016 to [place].

## 2016-07-01 ENCOUNTER — Encounter: Payer: Self-pay | Admitting: Obstetrics & Gynecology

## 2016-07-01 NOTE — Telephone Encounter (Signed)
We have approved this medication from 06/28/2016 through 02/16/2017. Patient has been notified via [place].  Routing to provider for final review. Patient agreeable to disposition. Will close encounter.

## 2016-07-07 ENCOUNTER — Ambulatory Visit (INDEPENDENT_AMBULATORY_CARE_PROVIDER_SITE_OTHER): Payer: Medicare Other | Admitting: Obstetrics & Gynecology

## 2016-07-07 ENCOUNTER — Encounter: Payer: Self-pay | Admitting: Obstetrics & Gynecology

## 2016-07-07 VITALS — BP 102/68 | HR 72 | Resp 16 | Ht 59.0 in | Wt 122.8 lb

## 2016-07-07 DIAGNOSIS — N761 Subacute and chronic vaginitis: Secondary | ICD-10-CM | POA: Diagnosis not present

## 2016-07-07 DIAGNOSIS — A499 Bacterial infection, unspecified: Secondary | ICD-10-CM | POA: Diagnosis not present

## 2016-07-07 NOTE — Progress Notes (Signed)
GYNECOLOGY  VISIT   HPI: 66 y.o. G93P1102 Married Caucasian female here for repeating testing for BV.  She is using metrogel 0.75% twice weekly.  She has been with her daughter in [place] who has a new baby.  She has been self treated for yeast due to white and clumpy discharge that she thinks may actually be the metrogel 0.75%.    She used Diflucan 150mg  on days 5/6, 5/9, 5/12, 5/15.  Today she asks if the metrogel will give a clumpy white discharge--which it does.  Now she thinks this is what is causing the discharge.  I have really encouraged her to not to use anything that may be for vaginitis symptoms without having a test to confirm this.  She really did not want to come back to [place] the last few weeks because of her daughter's new baby.  She voices understanding going forward.  Today, she states she feels better but still is not sure her symptoms are resolved.  She feels a little "irritated".  Otherwise, denies symptoms.  GYNECOLOGIC HISTORY: Patient's last menstrual period was 10/18/2001. Contraception: hysterectomy Menopausal hormone therapy: Vivelle dot  Patient Active Problem List   Diagnosis Date Noted  . Hearing loss of both ears 12/25/2014  . Spinal stenosis at L4-L5 level 08/09/2014  . Hyperlipidemia   . Hypertension   . Prediabetes   . Vitamin D deficiency   . GERD (gastroesophageal reflux disease) 11/17/2011  . Interstitial cystitis     Past Medical History:  Diagnosis Date  . Anxiety   . Arthritis    in neck  . Bursitis of right hip   . Carpal tunnel syndrome, bilateral    rt wrist  . Complication of anesthesia    "sore throat"  . Eagle's syndrome   . GERD (gastroesophageal reflux disease)   . Heart murmur    as child  . HNP (herniated nucleus pulposus)   . Interstitial cystitis   . Kidney cysts   . Osteopenia   . Plantar fasciitis   . Spinal stenosis   . Tingling    rt foot  . Wears hearing aid     Past Surgical History:  Procedure Laterality Date   . ABDOMINAL HYSTERECTOMY  2003   BSO  . ANAL SPHINCTEROTOMY    . CARPAL TUNNEL RELEASE     LEFT  . CESAREAN SECTION    . COLONOSCOPY     internal hemorrhoids  . cortisone injection     in neck  . CYSTOSCOPY    . [place]  . LUMBAR LAMINECTOMY    . LUMBAR LAMINECTOMY/DECOMPRESSION MICRODISCECTOMY N/A 08/09/2014   Procedure: MICRO LUMBAR DECOMPRESSION L3-4,  REDO DECOMPRESSION, MICRODISCECTOMY L4-5, FORAMINOTOMY L3-L4, L4-L5 BILATERAL;  Surgeon: Susa Day, MD;  Location: [facility];  Service: Orthopedics;  Laterality: N/A;  . MYRINGOTOMY    . NASAL SINUS SURGERY    . TONSILLECTOMY     LEFT only, due to Eagle Syndrome  . TONSILLECTOMY  10/14   right side due to Eagles syndrome  . TRIGGER FINGER RELEASE     RIGHT  . TYMPANOSTOMY TUBE PLACEMENT      MEDS:  Reviewed in EPIC and UTD  ALLERGIES: Other; Codeine; and Sulfa antibiotics  Family History  Problem Relation Age of Onset  . Diabetes Mother   . Hypertension Mother   . Hyperlipidemia Mother   . Osteoporosis Mother   . Hypertension Brother   . Hyperlipidemia Brother   . Prostate cancer Father  Bone    SH:  Married, non smoker  Review of Systems  All other systems reviewed and are negative.   PHYSICAL EXAMINATION:    BP 102/68 (BP Location: Right Arm, Patient Position: Sitting, Cuff Size: Normal)   Pulse 72   Resp 16   Ht 4\' 11"  (1.499 m)   Wt 122 lb 12.8 oz (55.7 kg)   [name] 10/18/2001   BMI 24.80 kg/m     General appearance: alert, cooperative and appears stated age Abdomen: soft, non-tender; bowel sounds normal; no masses,  no organomegaly  Pelvic: External genitalia:  no lesions              Urethra:  n0ormal appearing urethra with no masses, tenderness or lesions              Bartholins and Skenes: normal                 Vagina: normal appearing vagina with normal color and discharge, no lesions              Cervix: absent              Bimanual Exam:  Uterus:  uterus absent               Adnexa: no mass, fullness, tenderness              Anus:  normal sphincter tone, no lesions  Chaperone was present for exam.  Assessment: Recurrent BV, using twice weekly Metrogel 0.75%  Plan: Pt will continue with dosing until repeat testing is back.

## 2016-07-10 LAB — SURESWAB BACTERIAL VAGINOSIS/ITIS
Atopobium vaginae: NOT DETECTED Log (cells/mL)
C. albicans, DNA: NOT DETECTED
C. glabrata, DNA: NOT DETECTED
C. parapsilosis, DNA: NOT DETECTED
C. tropicalis, DNA: NOT DETECTED
Gardnerella vaginalis: 6.5 Log (cells/mL)
LACTOBACILLUS SPECIES: NOT DETECTED Log (cells/mL)
MEGASPHAERA SPECIES: NOT DETECTED Log (cells/mL)
T. vaginalis RNA, QL TMA: NOT DETECTED

## 2016-07-16 ENCOUNTER — Encounter: Payer: Self-pay | Admitting: Obstetrics & Gynecology

## 2016-07-22 DIAGNOSIS — H7203 Central perforation of tympanic membrane, bilateral: Secondary | ICD-10-CM | POA: Diagnosis not present

## 2016-07-22 DIAGNOSIS — H6983 Other specified disorders of Eustachian tube, bilateral: Secondary | ICD-10-CM | POA: Diagnosis not present

## 2016-07-22 DIAGNOSIS — H838X3 Other specified diseases of inner ear, bilateral: Secondary | ICD-10-CM | POA: Diagnosis not present

## 2016-07-24 ENCOUNTER — Ambulatory Visit: Payer: 59 | Admitting: Obstetrics & Gynecology

## 2016-08-06 DIAGNOSIS — M940 Chondrocostal junction syndrome [Tietze]: Secondary | ICD-10-CM | POA: Insufficient documentation

## 2016-08-15 DIAGNOSIS — M25521 Pain in right elbow: Secondary | ICD-10-CM | POA: Diagnosis not present

## 2016-08-15 DIAGNOSIS — G5601 Carpal tunnel syndrome, right upper limb: Secondary | ICD-10-CM | POA: Diagnosis not present

## 2016-09-01 ENCOUNTER — Encounter: Payer: Self-pay | Admitting: Obstetrics & Gynecology

## 2016-09-03 ENCOUNTER — Telehealth: Payer: Self-pay | Admitting: *Deleted

## 2016-09-03 NOTE — Telephone Encounter (Signed)
Call to patient regarding My Chart message. Patient states she just wanted to advise Dr Sabra Heck of dose and duration of antibiotic prior to appointment. Plans to keep follow-up for next week and will discuss appoitnment with Dr Oletta Lamas at that time. Advised Dr Sabra Heck currently out of office but will review this call when she returns.  Routing to provider for final review. Patient agreeable to disposition. Will close encounter.    Routing to Dr Quincy Simmonds, covering for Dr Sabra Heck.

## 2016-09-03 NOTE — Telephone Encounter (Signed)
My Chart message from patient:  This is not a question, but wanted to let you know that I am at the end of taking Amoxicillin 875 mg tablets twice a day for 7 days. An endodontist prescribed the medication for a "dental" issue. I will see you on the 24th for lab testing for BV...so far no sign of a yeast infection due to the antibiotic. Hope it stays that way and the BV is continuing to improve. Would love to cancel that appointment in [place] with Dr. Oletta Lamas scheduled for mid-August.    Helen Glover

## 2016-09-09 ENCOUNTER — Encounter: Payer: Self-pay | Admitting: Obstetrics & Gynecology

## 2016-09-09 ENCOUNTER — Ambulatory Visit (INDEPENDENT_AMBULATORY_CARE_PROVIDER_SITE_OTHER): Payer: Medicare Other | Admitting: Obstetrics & Gynecology

## 2016-09-09 VITALS — BP 112/72 | HR 76 | Resp 12 | Ht 59.0 in | Wt 118.5 lb

## 2016-09-09 DIAGNOSIS — Z79899 Other long term (current) drug therapy: Secondary | ICD-10-CM | POA: Diagnosis not present

## 2016-09-09 DIAGNOSIS — N760 Acute vaginitis: Secondary | ICD-10-CM

## 2016-09-09 MED ORDER — METRONIDAZOLE 0.75 % VA GEL: VAGINAL | 3 refills | Status: DC

## 2016-09-09 NOTE — Progress Notes (Addendum)
GYNECOLOGY  VISIT   HPI: 66 y.o. G60P1102 Married Caucasian female here for repeat testing for chronic/recurrent BV.  Reports she is feeling better and thinking about her "anatomy" much less as symptoms have significantly improved.  Using metrogel one applicator twice weekly.  Until this week, denies any discharge or itching or irritation.  Denies vaginal bleeding.  She has been on Amoxicillin bid x 7 days for infection around tooth.  Just finished that over the weekend.  She saw her dentist today and now doing salt water washes.  Area is not fully healed so she may need something else done as well.  Denies pelvic pain or urinary symptoms.  GYNECOLOGIC HISTORY: Patient's last menstrual period was 10/18/2001. Contraception: hysterectomy  Menopausal hormone therapy: Minivelle 0.0375mg  patches twice weekly  Patient Active Problem List   Diagnosis Date Noted  . Hearing loss of both ears 12/25/2014  . Spinal stenosis at L4-L5 level 08/09/2014  . Hyperlipidemia   . Hypertension   . Prediabetes   . Vitamin D deficiency   . GERD (gastroesophageal reflux disease) 11/17/2011  . Interstitial cystitis     Past Medical History:  Diagnosis Date  . Anxiety   . Arthritis    in neck  . Bursitis of right hip   . Carpal tunnel syndrome, bilateral    rt wrist  . Complication of anesthesia    "sore throat"  . Eagle's syndrome   . GERD (gastroesophageal reflux disease)   . Heart murmur    as child  . HNP (herniated nucleus pulposus)   . Interstitial cystitis   . Kidney cysts   . Osteopenia   . Plantar fasciitis   . Spinal stenosis   . Tingling    rt foot  . Wears hearing aid     Past Surgical History:  Procedure Laterality Date  . ABDOMINAL HYSTERECTOMY  2003   BSO  . ANAL SPHINCTEROTOMY    . CARPAL TUNNEL RELEASE     LEFT  . CESAREAN SECTION    . COLONOSCOPY     internal hemorrhoids  . cortisone injection     in neck  . CYSTOSCOPY    . [place]  . LUMBAR  LAMINECTOMY    . LUMBAR LAMINECTOMY/DECOMPRESSION MICRODISCECTOMY N/A 08/09/2014   Procedure: MICRO LUMBAR DECOMPRESSION L3-4,  REDO DECOMPRESSION, MICRODISCECTOMY L4-5, FORAMINOTOMY L3-L4, L4-L5 BILATERAL;  Surgeon: Susa Day, MD;  Location: [facility];  Service: Orthopedics;  Laterality: N/A;  . MYRINGOTOMY    . NASAL SINUS SURGERY    . TONSILLECTOMY     LEFT only, due to Eagle Syndrome  . TONSILLECTOMY  10/14   right side due to Eagles syndrome  . TRIGGER FINGER RELEASE     RIGHT  . TYMPANOSTOMY TUBE PLACEMENT      MEDS:  Reviewed in EPIC and UTD  ALLERGIES: Other; Codeine; and Sulfa antibiotics  Family History  Problem Relation Age of Onset  . Diabetes Mother   . Hypertension Mother   . Hyperlipidemia Mother   . Osteoporosis Mother   . Hypertension Brother   . Hyperlipidemia Brother   . Prostate cancer Father        Bone    SH:  Married, non smoker  Review of Systems  Constitutional: Negative.   HENT:       Mild tooth pain  Gastrointestinal: Negative.   Genitourinary: Negative.     PHYSICAL EXAMINATION:    BP 112/72 (BP Location: Right Arm, Patient Position: Sitting, Cuff Size: Normal)  Pulse 76   Resp 12   Ht 4\' 11"  (1.499 m)   Wt 118 lb 8 oz (53.8 kg)   [name] 10/18/2001   BMI 23.93 kg/m     General appearance: alert, cooperative and appears stated age Abdomen: soft, non-tender; bowel sounds normal; no masses,  no organomegaly  Pelvic: External genitalia:  no lesions              Urethra:  normal appearing urethra with no masses, tenderness or lesions              Bartolins and Skenes: normal                 Vagina: normal appearing vagina with normal color and discharge, no lesions              Cervix: absent              Bimanual Exam:  Uterus:  uterus absent              Adnexa: no mass, fullness, tenderness              Rectovaginal: No.              Anus:  no lesions  Chaperone was present for exam.  Assessment: Long term use of Metrogel for  recurrent bacterial vaginosis Recent antibiotic use Desires diabetes testing  Plan: Continue metrogel 0.75% pv twice weekly.  Rx to pharmacy. Nuswab BV and candida testing pending. HbA1C

## 2016-09-10 LAB — HEMOGLOBIN A1C
Est. average glucose Bld gHb Est-mCnc: 105 mg/dL
Hgb A1c MFr Bld: 5.3 % (ref 4.8–5.6)

## 2016-09-14 ENCOUNTER — Encounter: Payer: Self-pay | Admitting: Obstetrics & Gynecology

## 2016-09-15 ENCOUNTER — Encounter: Payer: Self-pay | Admitting: Obstetrics & Gynecology

## 2016-09-18 ENCOUNTER — Telehealth: Payer: Self-pay | Admitting: Obstetrics & Gynecology

## 2016-09-18 NOTE — Telephone Encounter (Signed)
Patient received some of her results and is calling to see if the other results have come in. Would also like to know if she should cancel the appointment she has with the doctor she was referred to in [place] sine the issue has resolved.

## 2016-09-18 NOTE — Telephone Encounter (Signed)
Patient calling wanting to know if yeast culture results had come back. Advised patient no, but lab states should be final sometime today. She also wants to know, since she is doing better, should she keep appointment with specialist Dr. Daniel Nones in [place] on 10-02-16? Advised patient Dr.Miller may want to wait for yeast results before advising her, but will discuss with Dr.Miller and call her back. Routing to Dr. Sabra Heck [phone] or Cell [phone]

## 2016-09-19 LAB — NUSWAB BV AND CANDIDA, NAA

## 2016-09-19 NOTE — Telephone Encounter (Signed)
Please let her know the yeast tests resulted today and are negative.  So, she does not need any treatment for yeast at this time.  1)  I think it is fine to continue with the twice weekly metrogel and complete six months.  If that is what she desires, I would like to see her in three months after she finishes.  2)  Yes it is ok to cancel the appt with Dr. Oletta Lamas in [place].  I think we've finally gotten this under control.

## 2016-09-19 NOTE — Telephone Encounter (Signed)
Spoke with patient.

## 2016-09-19 NOTE — Telephone Encounter (Signed)
Spoke with patient and notified of Dr.Miller's message below. Made patient f/u appointment 11-20-16 1:45pm and she states will cancel appointment with [email or web address] in [place].

## 2016-09-23 ENCOUNTER — Ambulatory Visit (INDEPENDENT_AMBULATORY_CARE_PROVIDER_SITE_OTHER): Payer: Medicare Other | Admitting: Internal Medicine

## 2016-09-23 VITALS — BP 132/76 | HR 72 | Temp 97.9°F | Resp 16 | Ht 59.0 in | Wt 118.0 lb

## 2016-09-23 DIAGNOSIS — M940 Chondrocostal junction syndrome [Tietze]: Secondary | ICD-10-CM

## 2016-09-23 MED ORDER — PREDNISONE 20 MG PO TABS: ORAL_TABLET | ORAL | 0 refills | Status: DC

## 2016-09-23 NOTE — Patient Instructions (Signed)
Costochondritis Costochondritis is swelling and irritation (inflammation) of the tissue (cartilage) that connects your ribs to your breastbone (sternum). This causes pain in the front of your chest. The pain usually starts gradually and involves more than one rib. What are the causes? The exact cause of this condition is not always known. It results from stress on the cartilage where your ribs attach to your sternum. The cause of this stress could be:  Chest injury (trauma).  Exercise or activity, such as lifting.  Severe coughing.  What increases the risk? You may be at higher risk for this condition if you:  Are female.  Are 30?65 years old.  Recently started a new exercise or work activity.  Have low levels of vitamin D.  Have a condition that makes you cough frequently.  What are the signs or symptoms? The main symptom of this condition is chest pain. The pain:  Usually starts gradually and can be sharp or dull.  Gets worse with deep breathing, coughing, or exercise.  Gets better with rest.  May be worse when you press on the sternum-rib connection (tenderness).  How is this diagnosed? This condition is diagnosed based on your symptoms, medical history, and a physical exam. Your health care provider will check for tenderness when pressing on your sternum. This is the most important finding. You may also have tests to rule out other causes of chest pain. These may include:  A chest X-ray to check for lung problems.  An electrocardiogram (ECG) to see if you have a heart problem that could be causing the pain.  An imaging scan to rule out a chest or rib fracture.  How is this treated? This condition usually goes away on its own over time. Your health care provider may prescribe an NSAID to reduce pain and inflammation. Your health care provider may also suggest that you:  Rest and avoid activities that make pain worse.  Apply heat or cold to the area to reduce pain  and inflammation.  Do exercises to stretch your chest muscles.  If these treatments do not help, your health care provider may inject a numbing medicine at the sternum-rib connection to help relieve the pain. Follow these instructions at home:  Avoid activities that make pain worse. This includes any activities that use chest, abdominal, and side muscles.  If directed, put ice on the painful area: ? Put ice in a plastic bag. ? Place a towel between your skin and the bag. ? Leave the ice on for 20 minutes, 2-3 times a day.  If directed, apply heat to the affected area as often as told by your health care provider. Use the heat source that your health care provider recommends, such as a moist heat pack or a heating pad. ? Place a towel between your skin and the heat source. ? Leave the heat on for 20-30 minutes. ? Remove the heat if your skin turns bright red. This is especially important if you are unable to feel pain, heat, or cold. You may have a greater risk of getting burned.  Take over-the-counter and prescription medicines only as told by your health care provider.  Return to your normal activities as told by your health care provider. Ask your health care provider what activities are safe for you.  Keep all follow-up visits as told by your health care provider. This is important. Contact a health care provider if:  You have chills or a fever.  Your pain does not go   away or it gets worse.  You have a cough that does not go away (is persistent). Get help right away if:  You have shortness of breath. This information is not intended to replace advice given to you by your health care provider. Make sure you discuss any questions you have with your health care provider. Document Released: 11/13/2004 Document Revised: 08/24/2015 Document Reviewed: 05/30/2015 Elsevier Interactive Patient Education  2018 [employer] Inc[employer]   

## 2016-09-23 NOTE — Progress Notes (Signed)
   Subjective:    Patient ID: Helen Glover, female    DOB: 1950/12/30, 66 y.o.   MRN: [id number]  HPI  Patient presents w/several day hx/o intermittent transient sharp stabbing pains in the Rt lower anterior para-sternal area. Denies injury, dyspnea, fever, chills, sweats .   Review of Systems  10 point systems review negative except as above.    Objective:   Physical Exam  BP 132/76   Pulse 72   Temp 97.9 F (36.6 C)   Resp 16   Ht 4\' 11"  (1.499 m)   Wt 118 lb (53.5 kg)   [name] 10/18/2001   BMI 23.83 kg/m   HEENT - WNL. Neck - supple.  Chest - Clear equal BS. (+) exquisite tenderness if the Right R8 sternal-chondral articulation. Cor - Nl HS. RRR w/o sig mgr. Abd - soft & benign. MS- FROM w/o deformities.  Gait Nl. Neuro -  Nl w/o focal abnormalities.    Assessment & Plan:   1. Costochondritis  - predniSONE  20 MG tablet; 1 tab 3 x day for 3 days, then 1 tab 2 x day for 3 days, then 1 tab 1 x day for 5 days  Dispense: 20 tablet; Refill: 0  - recc trial with heating pad.

## 2016-09-24 ENCOUNTER — Encounter: Payer: Self-pay | Admitting: Internal Medicine

## 2016-09-25 ENCOUNTER — Encounter: Payer: Self-pay | Admitting: Internal Medicine

## 2016-10-16 DIAGNOSIS — L821 Other seborrheic keratosis: Secondary | ICD-10-CM | POA: Diagnosis not present

## 2016-10-16 DIAGNOSIS — M795 Residual foreign body in soft tissue: Secondary | ICD-10-CM | POA: Diagnosis not present

## 2016-10-22 ENCOUNTER — Encounter: Payer: Self-pay | Admitting: Internal Medicine

## 2016-10-22 DIAGNOSIS — M25521 Pain in right elbow: Secondary | ICD-10-CM | POA: Diagnosis not present

## 2016-10-22 DIAGNOSIS — G5601 Carpal tunnel syndrome, right upper limb: Secondary | ICD-10-CM | POA: Diagnosis not present

## 2016-10-24 ENCOUNTER — Other Ambulatory Visit: Payer: Self-pay | Admitting: Obstetrics & Gynecology

## 2016-10-24 NOTE — Telephone Encounter (Signed)
Medication refill request: minivelle  Last AEX:  10/11/15 SM Next AEX: 01/16/17  Last MMG (if hormonal medication request): 01/21/16 [id number]:neg  Refill authorized: 10/11/15 #[id number]. Today please advise.

## 2016-10-26 NOTE — Progress Notes (Signed)
Assessment and Plan:  Diagnoses and all orders for this visit:  Right upper quadrant pain -     US Abdomen Limited RUQ; Future  Fatigue, unspecified type -     EKG 12-Lead   EKG within normal limits in the office today; counseled on symptoms of MI in women, provided heart healthy lifestyle information.   Exam in office today suggests possible gallbladder pathology ; will refer for abdominal US to rule out.     Further disposition pending results of labs. Discussed med's effects and SE's.   Over 30 minutes of exam, counseling, chart review, and critical decision making was performed.   Future Appointments Date Time Provider [place]  11/20/2016 1:45 PM Megan Salon, MD [place] None  12/25/2016 10:00 AM Vicie Mutters, PA-C [id number] None  01/16/2017 1:00 PM Megan Salon, MD [place] None      -----------------------------------------------------------------------------------------------------------------------------------------  HPI 66 y.o.female presents for RUQ abdominal pain. She reports this has been going on intermittently for approximately 4 weeks. She was diagnosed with costochondritis and treated with prednisone by Dr. Melford Aase; she states that the pain intensity of her rib pain has decreased from 8/10 to 4/10. However, the patient also describes a RUQ abdominal pain as dull ache/pressure, of variable length, typically 5-10 minutes that is somewhat associated with consumption of food. She denies activity or positioning improving or worsening her symptoms.   She has a history of GERD, denies recent episodes with current PPI treatment. She denies nausea, vomiting, constipation, diarrhea, melena or hematochezia. Endorses increased gas.    The patient also reports an episode recently where she experienced unusual fatigue and mild SOB with exertion while hiking with her daughter; the patient denies concurrent CP, dizziness, diaphoresis, nausea, upper extremity or  jaw pain.    PMH: Includes GERD, heart murmur as a child, interstitial cystitis and kidney cysts.  PSH: Includes a cesarean section and abdominal hysterectomy.   Allergies  Allergen Reactions  . Other     Band-aid  . Codeine Hives  . Sulfa Antibiotics Nausea And Vomiting    Current Outpatient Prescriptions on File Prior to Visit  Medication Sig  . Ascorbic Acid (VITAMIN C) 100 MG tablet Take 100 mg by mouth daily.  Marland Kitchen aspirin EC 81 MG tablet Take 81 mg by mouth daily.  . Azelaic Acid (FINACEA) 15 % cream Apply topically 2 (two) times daily. After skin is thoroughly washed and patted dry, gently but thoroughly massage a thin film of azelaic acid cream into the affected area twice daily, in the morning and evening.  . Biotin 1 MG CAPS Take 1 capsule by mouth daily.   . calcium carbonate (OS-CAL - DOSED IN MG OF ELEMENTAL CALCIUM) 1250 (500 Ca) MG tablet Take 1 tablet by mouth.  . cholecalciferol (VITAMIN D) 1000 UNITS tablet Take 3,000 Units by mouth daily.   . Flaxseed, Linseed, (FLAX SEED OIL) 1000 MG CAPS Take by mouth.  Marland Kitchen MINIVELLE 0.0375 MG/24HR PLACE 1 PATCH ONTO THE SKIN 2 (TWO) TIMES A WEEK.  . mirabegron ER (MYRBETRIQ) 50 MG TB24 tablet Take 25 mg by mouth daily.   Marland Kitchen omeprazole (PRILOSEC) 40 MG capsule Take 40 mg by mouth 2 (two) times daily.  . polyethylene glycol (MIRALAX) packet Take 17 g by mouth daily as needed.  . Probiotic Product (PROBIOTIC PO) Take by mouth daily.  Marland Kitchen pyridOXINE (VITAMIN B-6) 100 MG tablet Take 100 mg by mouth daily.  . vitamin B-12 (CYANOCOBALAMIN) 100 MCG tablet Take 100  mcg by mouth daily.   No current facility-administered medications on file prior to visit.     ROS:  Review of Systems  Constitutional: Positive for malaise/fatigue. Negative for chills, diaphoresis and fever. Weight loss: Single episode of unusual fatigue with exertion.   Respiratory: Negative for cough, shortness of breath and wheezing.   Cardiovascular: Negative for chest  pain, palpitations, orthopnea, claudication, leg swelling and PND.  Gastrointestinal: Positive for abdominal pain. Negative for blood in stool, constipation, diarrhea, heartburn, melena, nausea and vomiting.  Genitourinary: Negative for dysuria, flank pain, frequency, hematuria and urgency.  Musculoskeletal: Negative for back pain and myalgias.  Neurological: Negative for dizziness, sensory change, weakness and headaches.  All other systems reviewed and are negative.    Physical Exam: Filed Weights   10/27/16 1626  Weight: 121 lb (54.9 kg)   BP 120/66   Pulse 80   Temp 97.7 F (36.5 C)   Ht 4\' 11"  (1.499 m)   Wt 121 lb (54.9 kg)   [name] 10/18/2001   SpO2 98%   BMI 24.44 kg/m  General Appearance: Well nourished, in no apparent distress. Eyes: PERRLA, EOMs, conjunctiva no swelling or erythema Sinuses: No Frontal/maxillary tenderness ENT/Mouth: Ext aud canals clear, TMs without erythema; right side has tube present, left TM with large perforation (per the patient chronic in nature). No erythema, swelling, or exudate on post pharynx.  Tonsils not swollen or erythematous. Hearing normal.  Neck: Supple, thyroid normal.  Respiratory: Respiratory effort normal, BS equal bilaterally without rales, rhonchi, wheezing or stridor.  Cardio: RRR with no MRGs. Brisk peripheral pulses without edema.  Abdomen: Soft, + BS.  No guarding, rebound, hernias, masses. +Murphey's sign Lymphatics: Non tender without lymphadenopathy.  Musculoskeletal: Full ROM, 5/5 strength, normal gait. Point tenderness over right lateral 8th/9th ribs.   Skin: Warm, dry without rashes, lesions, ecchymosis.  Neuro: Normal muscle tone, no cerebellar symptoms. Sensation intact.  Psych: Awake and oriented X 3, normal affect, Insight and Judgment appropriate.     [name], NP 5:31 PM [facility]

## 2016-10-27 ENCOUNTER — Ambulatory Visit (INDEPENDENT_AMBULATORY_CARE_PROVIDER_SITE_OTHER): Payer: Medicare Other | Admitting: Adult Health

## 2016-10-27 ENCOUNTER — Encounter: Payer: Self-pay | Admitting: Adult Health

## 2016-10-27 VITALS — BP 120/66 | HR 80 | Temp 97.7°F | Ht 59.0 in | Wt 121.0 lb

## 2016-10-27 DIAGNOSIS — R5383 Other fatigue: Secondary | ICD-10-CM | POA: Diagnosis not present

## 2016-10-27 DIAGNOSIS — R1011 Right upper quadrant pain: Secondary | ICD-10-CM

## 2016-10-27 NOTE — Patient Instructions (Signed)
We are sending you to have an abdominal ultrasound performed to help rule out any gallbladder disease based on today's symptoms and exam.   As we discussed, women often do not experience typical chest pain with heart attacks. If you experience any unusual fatigue, chest pressure, shortness of breath, nausea, diaphoresis, etc. Unusual symptoms with exertion, please call us or go the the ER.   Here is some information to help you keep your heart healthy: Move it! - Aim for 30 mins of activity every day. Take it slowly at first. Talk to [place] before starting any new exercise program.   Lose it.  -Body Mass Index (BMI) can indicate if you need to lose weight. A healthy range is 18.5-24.9. For a BMI calculator, go to [email or web address]  Waist Management -Excess abdominal fat is a risk factor for heart disease, diabetes, asthma, stroke and more. Ideal waist circumference is less than 35" for women and less than 40" for men.   Eat Right -focus on fruits, vegetables, whole grains, and meals you make yourself. Avoid foods with trans fat and high sugar/sodium content.   Snooze or Snore? - Loud snoring can be a sign of sleep apnea, a significant risk factor for high blood pressure, heart attach, stroke, and heart arrhythmias.  Kick the habit -Quit Smoking! Avoid second hand smoke. A single cigarette raises your blood pressure for 20 mins and increases the risk of heart attack and stroke for the next 24 hours.   Are Aspirin and Supplements right for you? -Add ENTERIC COATED low dose 81 mg Aspirin daily OR can do every other day if you have easy bruising to protect your heart and head. As well as to reduce risk of Colon Cancer by 20 %, Skin Cancer by 26 % , Melanoma by 46% and Pancreatic cancer by 60%  Say "No to Stress -There may be little you can do about problems that cause stress. However, techniques such as long walks, meditation, and exercise can help you manage it.   Start Now! - Make changes one  at a time and set reasonable goals to increase your likelihood of success.    Cholecystitis Cholecystitis is inflammation of the gallbladder. It is often called a gallbladder attack. The gallbladder is a pear-shaped organ that lies beneath the liver on the right side of the body. The gallbladder stores bile, which is a fluid that helps the body to digest fats. If bile builds up in your gallbladder, your gallbladder becomes inflamed. This condition may occur suddenly (be acute). Repeat episodes of acute cholecystitis or prolonged episodes may lead to a long-term (chronic) condition. Cholecystitis is serious and it requires treatment. What are the causes? The most common cause of this condition is gallstones. Gallstones can block the tube (duct) that carries bile out of your gallbladder. This causes bile to build up. Other causes of this condition include:  Damage to the gallbladder due to a decrease in blood flow.  Infections in the bile ducts.  Scars or kinks in the bile ducts.  Tumors in the liver, pancreas, or gallbladder.  What increases the risk? This condition is more likely to develop in:  People who have sickle cell disease.  People who take birth control pills or use estrogen.  People who have alcoholic liver disease.  People who have liver cirrhosis.  People who have their nutrition delivered through a vein (parenteral nutrition).  People who do not eat or drink (do fasting) for a long period of time.  People who are obese.  People who have rapid weight loss.  People who are pregnant.  People who have increased triglyceride levels.  People who have pancreatitis.  What are the signs or symptoms? Symptoms of this condition include:  Abdominal pain, especially in the upper right area of the abdomen.  Abdominal tenderness or bloating.  Nausea.  Vomiting.  Fever.  Chills.  Yellowing of the skin and the whites of the eyes (jaundice).  How is this  diagnosed? This condition is diagnosed with a medical history and physical exam. You may also have other tests, including:  Imaging tests, such as: ? An ultrasound of the gallbladder. ? A CT scan of the abdomen. ? A gallbladder nuclear scan (HIDA scan). This scan allows your health care provider to see the bile moving from your liver to your gallbladder and to your small intestine. ? MRI.  Blood tests, such as: ? A complete blood count, because the white blood cell count may be higher than normal. ? Liver function tests, because some levels may be higher than normal with certain types of gallstones.  How is this treated? Treatment may include:  Fasting for a certain amount of time.  IV fluids.  Medicine to treat pain or vomiting.  Antibiotic medicine.  Surgery to remove your gallbladder (cholecystectomy). This may happen immediately or at a later time.  Follow these instructions at home: Home care will depend on your treatment. In general:  Take over-the-counter and prescription medicines only as told by your health care provider.  If you were prescribed an antibiotic medicine, take it as told by your health care provider. Do not stop taking the antibiotic even if you start to feel better.  Follow instructions from your health care provider about what to eat or drink. When you are allowed to eat, avoid eating or drinking anything that triggers your symptoms.  Keep all follow-up visits as told by your health care provider. This is important.  Contact a health care provider if:  Your pain is not controlled with medicine.  You have a fever. Get help right away if:  Your pain moves to another part of your abdomen or to your back.  You continue to have symptoms or you develop new symptoms even with treatment. This information is not intended to replace advice given to you by your health care provider. Make sure you discuss any questions you have with your health care  provider. Document Released: 02/03/2005 Document Revised: 06/14/2015 Document Reviewed: 05/17/2014 Elsevier Interactive Patient Education  2017 [employer].

## 2016-10-29 ENCOUNTER — Encounter: Payer: Self-pay | Admitting: Adult Health

## 2016-10-29 ENCOUNTER — Ambulatory Visit
Admission: RE | Admit: 2016-10-29 | Discharge: 2016-10-29 | Disposition: A | Discharge status: Home / Self Care | Payer: Medicare Other | Source: Ambulatory Visit | Attending: Adult Health | Admitting: Adult Health

## 2016-10-29 DIAGNOSIS — R1011 Right upper quadrant pain: Secondary | ICD-10-CM

## 2016-11-03 ENCOUNTER — Ambulatory Visit: Payer: Self-pay | Admitting: Physician Assistant

## 2016-11-17 DIAGNOSIS — N3281 Overactive bladder: Secondary | ICD-10-CM | POA: Diagnosis not present

## 2016-11-20 ENCOUNTER — Encounter: Payer: Self-pay | Admitting: Obstetrics & Gynecology

## 2016-11-20 ENCOUNTER — Ambulatory Visit (INDEPENDENT_AMBULATORY_CARE_PROVIDER_SITE_OTHER): Payer: Medicare Other | Admitting: Obstetrics & Gynecology

## 2016-11-20 VITALS — BP 106/70 | HR 70 | Resp 14 | Wt 119.0 lb

## 2016-11-20 DIAGNOSIS — N760 Acute vaginitis: Secondary | ICD-10-CM

## 2016-11-20 MED ORDER — METRONIDAZOLE 0.75 % VA GEL: 1.0000 | Freq: Every day | VAGINAL | 0 refills | Status: DC

## 2016-11-20 NOTE — Progress Notes (Signed)
GYNECOLOGY  VISIT  CC:   Recheck recurrent BV  HPI: 66 y.o. [id number] Married Caucasian female here for recheck of recurrent BV.  Pt reports she's been off the Metrogel for about three weeks and as she was running out she decreased to weekly so did this for about two more weeks.  Denies vaginal bleeding.  Has "typical" urinary symptoms due to her IC.  Saw urologist, Dr. Sherilyn Cooter yesterday.  Myrbetriq has helped.  Overall, feeling much better.    GYNECOLOGIC HISTORY: Patient's last menstrual period was 10/18/2001. Contraception: hysterectomy Menopausal hormone therapy: none  Patient Active Problem List   Diagnosis Date Noted  . Hearing loss of both ears 12/25/2014  . Hyperlipidemia   . Hypertension   . Prediabetes   . Vitamin D deficiency   . GERD (gastroesophageal reflux disease) 11/17/2011  . Interstitial cystitis     Past Medical History:  Diagnosis Date  . Anxiety   . Arthritis    in neck  . Bursitis of right hip   . Carpal tunnel syndrome, bilateral    rt wrist  . Complication of anesthesia    "sore throat"  . Eagle's syndrome   . GERD (gastroesophageal reflux disease)   . Heart murmur    as child  . HNP (herniated nucleus pulposus)   . Interstitial cystitis   . Kidney cysts   . Osteopenia   . Plantar fasciitis   . Spinal stenosis   . Tingling    rt foot  . Wears hearing aid     Past Surgical History:  Procedure Laterality Date  . ABDOMINAL HYSTERECTOMY  2003   BSO  . ANAL SPHINCTEROTOMY    . CARPAL TUNNEL RELEASE     LEFT  . CESAREAN SECTION    . COLONOSCOPY     internal hemorrhoids  . cortisone injection     in neck  . CYSTOSCOPY    . [place]  . LUMBAR LAMINECTOMY    . LUMBAR LAMINECTOMY/DECOMPRESSION MICRODISCECTOMY N/A 08/09/2014   Procedure: MICRO LUMBAR DECOMPRESSION L3-4,  REDO DECOMPRESSION, MICRODISCECTOMY L4-5, FORAMINOTOMY L3-L4, L4-L5 BILATERAL;  Surgeon: Susa Day, MD;  Location: [facility];  Service: Orthopedics;   Laterality: N/A;  . MYRINGOTOMY    . NASAL SINUS SURGERY    . TONSILLECTOMY     LEFT only, due to Eagle Syndrome  . TONSILLECTOMY  10/14   right side due to Eagles syndrome  . TRIGGER FINGER RELEASE     RIGHT  . TYMPANOSTOMY TUBE PLACEMENT      MEDS:   Current Outpatient Prescriptions on File Prior to Visit  Medication Sig Dispense Refill  . Ascorbic Acid (VITAMIN C) 100 MG tablet Take 100 mg by mouth daily.    Marland Kitchen aspirin EC 81 MG tablet Take 81 mg by mouth daily.    . Azelaic Acid (FINACEA) 15 % cream Apply topically 2 (two) times daily. After skin is thoroughly washed and patted dry, gently but thoroughly massage a thin film of azelaic acid cream into the affected area twice daily, in the morning and evening.    . Biotin 1 MG CAPS Take 1 capsule by mouth daily.     . calcium carbonate (OS-CAL - DOSED IN MG OF ELEMENTAL CALCIUM) 1250 (500 Ca) MG tablet Take 1 tablet by mouth.    . cholecalciferol (VITAMIN D) 1000 UNITS tablet Take 3,000 Units by mouth daily.     . Flaxseed, Linseed, (FLAX SEED OIL) 1000 MG CAPS Take by mouth.    Marland Kitchen  MINIVELLE 0.0375 MG/24HR PLACE 1 PATCH ONTO THE SKIN 2 (TWO) TIMES A WEEK. 24 patch 1  . mirabegron ER (MYRBETRIQ) 50 MG TB24 tablet Take 25 mg by mouth daily.     Marland Kitchen omeprazole (PRILOSEC) 40 MG capsule Take 40 mg by mouth 2 (two) times daily.  3  . polyethylene glycol (MIRALAX) packet Take 17 g by mouth daily as needed.    . Probiotic Product (PROBIOTIC PO) Take by mouth daily.    Marland Kitchen pyridOXINE (VITAMIN B-6) 100 MG tablet Take 100 mg by mouth daily.    . vitamin B-12 (CYANOCOBALAMIN) 100 MCG tablet Take 100 mcg by mouth daily.     No current facility-administered medications on file prior to visit.     ALLERGIES: Other; Codeine; and Sulfa antibiotics  Family History  Problem Relation Age of Onset  . Diabetes Mother   . Hypertension Mother   . Hyperlipidemia Mother   . Osteoporosis Mother   . Hypertension Brother   . Hyperlipidemia Brother   .  Prostate cancer Father        Bone    SH:  Married, non smoker  Review of Systems  All other systems reviewed and are negative.   PHYSICAL EXAMINATION:    BP 106/70 (BP Location: Right Arm, Patient Position: Sitting, Cuff Size: Normal)   Pulse 70   Resp 14   Wt 119 lb (54 kg)   [name] 10/18/2001   BMI 24.04 kg/m     General appearance: alert, cooperative and appears stated age Abdomen: soft, non-tender; bowel sounds normal; no masses,  no organomegaly  Pelvic: External genitalia:  no lesions              Urethra:  normal appearing urethra with no masses, tenderness or lesions              Bartholins and Skenes: normal                 Vagina: normal appearing vagina with normal color and discharge, no lesions              Cervix: absent              Bimanual Exam:  Uterus:  uterus absent              Adnexa: normal adnexa              Anus:  no lesions  Chaperone was present for exam.  Assessment: Recurrent vaginitis, BV  Plan: Nuswab obtained today.  Results will be communicated to pt.   Rx for Metrogel 0.75% given to pt to keep if needed.

## 2016-11-24 DIAGNOSIS — Z23 Encounter for immunization: Secondary | ICD-10-CM | POA: Diagnosis not present

## 2016-11-25 DIAGNOSIS — D3001 Benign neoplasm of right kidney: Secondary | ICD-10-CM | POA: Diagnosis not present

## 2016-11-25 LAB — NUSWAB BV AND CANDIDA, NAA
Candida albicans, NAA: NEGATIVE
Candida glabrata, NAA: NEGATIVE

## 2016-12-08 ENCOUNTER — Encounter: Payer: Self-pay | Admitting: Adult Health

## 2016-12-08 ENCOUNTER — Ambulatory Visit (INDEPENDENT_AMBULATORY_CARE_PROVIDER_SITE_OTHER): Payer: Medicare Other | Admitting: Adult Health

## 2016-12-08 VITALS — BP 126/82 | HR 68 | Temp 97.3°F | Ht 59.0 in | Wt 118.0 lb

## 2016-12-08 DIAGNOSIS — J0101 Acute recurrent maxillary sinusitis: Secondary | ICD-10-CM | POA: Diagnosis not present

## 2016-12-08 MED ORDER — AZITHROMYCIN 250 MG PO TABS: ORAL_TABLET | ORAL | 1 refills | Status: AC

## 2016-12-08 MED ORDER — PREDNISONE 20 MG PO TABS: ORAL_TABLET | ORAL | 0 refills | Status: DC

## 2016-12-08 NOTE — Patient Instructions (Signed)

## 2016-12-08 NOTE — Progress Notes (Signed)
Assessment and Plan:  Helen Glover was seen today for uri.  Diagnoses and all orders for this visit:  Acute recurrent maxillary sinusitis - Discussed the importance of avoiding unnecessary antibiotic therapy. Suggested symptomatic OTC remedies Nasal saline spray for congestion. Nasal steroids, allergy pill, oral steroids Follow up as needed. Patient leaving town this weekend; antibiotic prescription printed to be filled should symptoms not begin to improve in the next 3 days.   -     predniSONE (DELTASONE) 20 MG tablet; 2 tablets daily for 3 days, 1 tablet daily for 4 days. -     azithromycin (ZITHROMAX) 250 MG tablet; Take 2 tablets (500 mg) on  Day 1,  followed by 1 tablet (250 mg) once daily on Days 2 through 5.  Further disposition pending results of labs. Discussed med's effects and SE's.   Over 15 minutes of exam, counseling, chart review, and critical decision making was performed.   Future Appointments Date Time Provider [place]  12/25/2016 10:00 AM Vicie Mutters, PA-C [id number] None  01/16/2017 1:00 PM Megan Salon, MD [place] None    ------------------------------------------------------------------------------------------------------------------   HPI BP 126/82   Pulse 68   Temp (!) 97.3 F (36.3 C)   Ht 4\' 11"  (1.499 m)   Wt 118 lb (53.5 kg)   [name] 10/18/2001   SpO2 98%   BMI 23.83 kg/m   66 y.o.female presents for congestion, hoarse, sore throat, sinus pressure x 6 days. She denies fever/chills, HA/dizziness, ear pain, facial pain, changes in vision/hearing, CP/SOB/wheezing, N/V/D/abdominal pain.  She does endorse fatigue. Has been on mucinex DM -detromethorphan + guaifenesin as well as pheneylephrine x 4 days which has helped some. Patient is headed out of town this Thursday and is concerned about these symptoms continuing or becoming worse.   She reports a history of recurrent sinus infections, most recent was this time last year.   Sees Dr. Benjamine Mola  annually. She has a permanently perforated L TM, R with tube present.   Past Medical History:  Diagnosis Date  . Anxiety   . Arthritis    in neck  . Bursitis of right hip   . Carpal tunnel syndrome, bilateral    rt wrist  . Complication of anesthesia    "sore throat"  . Eagle's syndrome   . GERD (gastroesophageal reflux disease)   . Heart murmur    as child  . HNP (herniated nucleus pulposus)   . Interstitial cystitis   . Kidney cysts   . Osteopenia   . Plantar fasciitis   . Spinal stenosis   . Tingling    rt foot  . Wears hearing aid      Allergies  Allergen Reactions  . Other     Band-aid  . Codeine Hives  . Sulfa Antibiotics Nausea And Vomiting    Current Outpatient Prescriptions on File Prior to Visit  Medication Sig  . Ascorbic Acid (VITAMIN C) 100 MG tablet Take 100 mg by mouth daily.  Marland Kitchen aspirin EC 81 MG tablet Take 81 mg by mouth daily.  . Azelaic Acid (FINACEA) 15 % cream Apply topically 2 (two) times daily. After skin is thoroughly washed and patted dry, gently but thoroughly massage a thin film of azelaic acid cream into the affected area twice daily, in the morning and evening.  . Biotin 1 MG CAPS Take 1 capsule by mouth daily.   . calcium carbonate (OS-CAL - DOSED IN MG OF ELEMENTAL CALCIUM) 1250 (500 Ca) MG tablet Take 1 tablet  by mouth.  . cholecalciferol (VITAMIN D) 1000 UNITS tablet Take 3,000 Units by mouth daily.   . Flaxseed, Linseed, (FLAX SEED OIL) 1000 MG CAPS Take by mouth.  Marland Kitchen MINIVELLE 0.0375 MG/24HR PLACE 1 PATCH ONTO THE SKIN 2 (TWO) TIMES A WEEK.  . mirabegron ER (MYRBETRIQ) 50 MG TB24 tablet Take 25 mg by mouth daily.   Marland Kitchen omeprazole (PRILOSEC) 40 MG capsule Take 40 mg by mouth 2 (two) times daily.  . polyethylene glycol (MIRALAX) packet Take 17 g by mouth daily as needed.  . Probiotic Product (PROBIOTIC PO) Take by mouth daily.  Marland Kitchen pyridOXINE (VITAMIN B-6) 100 MG tablet Take 100 mg by mouth daily.  . vitamin B-12 (CYANOCOBALAMIN) 100 MCG  tablet Take 100 mcg by mouth daily.  . metroNIDAZOLE (METROGEL) 0.75 % vaginal gel Place 1 Applicatorful vaginally at bedtime. One applicator vaginally twice weekly. (Patient not taking: Reported on 12/08/2016)   No current facility-administered medications on file prior to visit.     ROS: all negative except above.   Physical Exam:  BP 126/82   Pulse 68   Temp (!) 97.3 F (36.3 C)   Ht 4\' 11"  (1.499 m)   Wt 118 lb (53.5 kg)   [name] 10/18/2001   SpO2 98%   BMI 23.83 kg/m   General Appearance: Well nourished, in no apparent distress. Eyes: PERRLA, conjunctiva no swelling or erythema Sinuses: No Frontal tenderness, mild bilateral maxillary tenderness present.  ENT/Mouth: Ext aud canals clear, L TM with large perforation, visible structures without discharge/injection, R TM with extensive scarring and tube present at 5 o'clock- without erythema, discharge. Posterior pharynx mildly injected without swelling, or exudate.  Hearing normal.  Neck: Supple, thyroid normal.  Respiratory: Respiratory effort normal, BS equal bilaterally without rales, rhonchi, wheezing or stridor.  Cardio: RRR with no MRGs. Brisk peripheral pulses without edema.  Abdomen: Soft, + BS.  Non tender, no guarding, rebound, hernias, masses. Lymphatics: Non tender without lymphadenopathy.  Musculoskeletal: Full ROM, 5/5 strength, normal gait.  Skin: Warm, dry without rashes, lesions, ecchymosis.  Neuro: Cranial nerves intact. Normal muscle tone, no cerebellar symptoms. Sensation intact.  Psych: Awake and oriented X 3, normal affect, Insight and Judgment appropriate.     [name], NP 3:41 PM [facility]

## 2016-12-25 ENCOUNTER — Encounter: Payer: Self-pay | Admitting: Physician Assistant

## 2016-12-25 ENCOUNTER — Ambulatory Visit (INDEPENDENT_AMBULATORY_CARE_PROVIDER_SITE_OTHER): Payer: Medicare Other | Admitting: Physician Assistant

## 2016-12-25 VITALS — BP 126/74 | HR 66 | Temp 97.6°F | Resp 14 | Ht 59.0 in | Wt 120.8 lb

## 2016-12-25 DIAGNOSIS — Z0001 Encounter for general adult medical examination with abnormal findings: Secondary | ICD-10-CM

## 2016-12-25 DIAGNOSIS — E559 Vitamin D deficiency, unspecified: Secondary | ICD-10-CM

## 2016-12-25 DIAGNOSIS — Z23 Encounter for immunization: Secondary | ICD-10-CM | POA: Diagnosis not present

## 2016-12-25 DIAGNOSIS — N3010 Interstitial cystitis (chronic) without hematuria: Secondary | ICD-10-CM

## 2016-12-25 DIAGNOSIS — R6889 Other general symptoms and signs: Secondary | ICD-10-CM

## 2016-12-25 DIAGNOSIS — K219 Gastro-esophageal reflux disease without esophagitis: Secondary | ICD-10-CM

## 2016-12-25 DIAGNOSIS — Z79899 Other long term (current) drug therapy: Secondary | ICD-10-CM

## 2016-12-25 DIAGNOSIS — Z7189 Other specified counseling: Secondary | ICD-10-CM

## 2016-12-25 DIAGNOSIS — R7303 Prediabetes: Secondary | ICD-10-CM

## 2016-12-25 DIAGNOSIS — E785 Hyperlipidemia, unspecified: Secondary | ICD-10-CM | POA: Diagnosis not present

## 2016-12-25 DIAGNOSIS — I10 Essential (primary) hypertension: Secondary | ICD-10-CM

## 2016-12-25 DIAGNOSIS — Z0000 Encounter for general adult medical examination without abnormal findings: Secondary | ICD-10-CM

## 2016-12-25 DIAGNOSIS — Z6824 Body mass index (BMI) 24.0-24.9, adult: Secondary | ICD-10-CM | POA: Diagnosis not present

## 2016-12-25 DIAGNOSIS — H9193 Unspecified hearing loss, bilateral: Secondary | ICD-10-CM | POA: Diagnosis not present

## 2016-12-25 MED ORDER — OMEPRAZOLE 20 MG PO CPDR: 20.0000 mg | DELAYED_RELEASE_CAPSULE | Freq: Two times a day (BID) | ORAL | 1 refills | Status: DC

## 2016-12-25 NOTE — Patient Instructions (Signed)
GETTING OFF OF PPI's    Nexium/protonix/prilosec/Omeprazole/Dexilant/Aciphex are called PPI's, they are great at healing your stomach but should only be taken for a short period of time.     Recent studies have shown that taken for a long time they  can increase the risk of osteoporosis (weakening of your bones), pneumonia, low magnesium, restless legs, Cdiff (infection that causes diarrhea), DEMENTIA and most recently kidney damage / disease / insufficiency.     Due to this information we want to try to stop the PPI but if you try to stop it abruptly this can cause rebound acid and worsening symptoms.   So this is how we want you to get off the PPI: Generic is always fine!!  - Start taking the nexium/protonix/prilosec/PPI  every other day with  zantac (ranitidine) OR pepcid famotadine 2 x a day for 2-4 weeks - some people stay on this dosage and can not taper off further. Our main goal is to limit the dosage and amount you are taking so if you need to stay on this dose.   - then decrease the PPI to every 3 days while taking the zantac or pepcid 300mg  twice a day the other  days for 2-4  Weeks  - then you can try the zantac or pepcid 300mg  once at night or up to 2 x day as needed.  - you can continue on this once at night or stop all together  - Avoid alcohol, spicy foods, NSAIDS (aleve, ibuprofen) at this time. See foods below.   +++++++++++++++++++++++++++++++++++++++++++  Food Choices for Gastroesophageal Reflux Disease  When you have gastroesophageal reflux disease (GERD), the foods you eat and your eating habits are very important. Choosing the right foods can help ease the discomfort of GERD. WHAT GENERAL GUIDELINES DO I NEED TO FOLLOW?  Choose fruits, vegetables, whole grains, low-fat dairy products, and low-fat meat, fish, and poultry.  Limit fats such as oils, salad dressings, butter, nuts, and avocado.  Keep a food diary to identify foods that cause symptoms.  Avoid foods  that cause reflux. These may be different for different people.  Eat frequent small meals instead of three large meals each day.  Eat your meals slowly, in a relaxed setting.  Limit fried foods.  Cook foods using methods other than frying.  Avoid drinking alcohol.  Avoid drinking large amounts of liquids with your meals.  Avoid bending over or lying down until 2-3 hours after eating.   WHAT FOODS ARE NOT RECOMMENDED? The following are some foods and drinks that may worsen your symptoms:  Vegetables Tomatoes. Tomato juice. Tomato and spaghetti sauce. Chili peppers. Onion and garlic. Horseradish. Fruits Oranges, grapefruit, and lemon (fruit and juice). Meats High-fat meats, fish, and poultry. This includes hot dogs, ribs, ham, sausage, salami, and bacon. Dairy Whole milk and chocolate milk. Sour cream. Cream. Butter. Ice cream. Cream cheese.  Beverages Coffee and tea, with or without caffeine. Carbonated beverages or energy drinks. Condiments Hot sauce. Barbecue sauce.  Sweets/Desserts Chocolate and cocoa. Donuts. Peppermint and spearmint. Fats and Oils High-fat foods, including [place] fries and potato chips. Other Vinegar. Strong spices, such as black pepper, white pepper, red pepper, cayenne, curry powder, cloves, ginger, and chili powder.

## 2016-12-25 NOTE — Progress Notes (Signed)
WELCOME TO MEDICARE ANNUAL WELLNESS VISIT AND CPE  Assessment:   Essential hypertension - continue medications, DASH diet, exercise and monitor at home. Call if greater than 130/80. -     CBC with Differential/Platelet -     BASIC METABOLIC PANEL WITH GFR -     Hepatic function panel -     TSH -     Urinalysis, Routine w reflex microscopic -     Microalbumin / creatinine urine ratio  Hyperlipidemia, unspecified hyperlipidemia type -continue medications, check lipids, decrease fatty foods, increase activity.  -     Lipid panel  Prediabetes Monitor  Bilateral hearing loss, unspecified hearing loss type Hearing aids  Gastroesophageal reflux disease, esophagitis presence not specified GERD- will try to get off PPiI given info for taper and zantac sent in -     omeprazole (PRILOSEC) 20 MG capsule; Take 1 capsule (20 mg total) 2 (two) times daily before a meal by mouth.  Interstitial cystitis Monitor, diet discussed  Vitamin D deficiency Continue supplement  Encounter for Medicare annual wellness exam 1 year  Advanced care planning/counseling discussion Discussed with patient, will bring back with her, over 30 mins  Medication management -     Magnesium  BMI 24.0-24.9, adult  Need for prophylactic vaccination against Streptococcus pneumoniae (pneumococcus) -     Pneumococcal polysaccharide vaccine 23-valent greater than or equal to 2yo subcutaneous/IM    Over 40 minutes of exam, counseling, chart review and critical decision making was performed Future Appointments  Date Time Provider [place]  01/16/2017  1:00 PM Megan Salon, MD [place] None  01/04/2018 10:00 AM Vicie Mutters, PA-C [id number] None     Plan:   During the course of the visit the patient was educated and counseled about appropriate screening and preventive services including:    Pneumococcal vaccine   Prevnar 13  Influenza vaccine  Td vaccine  Screening  electrocardiogram  Bone densitometry screening  Colorectal cancer screening  Diabetes screening  Glaucoma screening  Nutrition counseling   Advanced directives: requested   Subjective:  Helen Glover is a 66 y.o. female who presents for WELCOME TO Medicare Annual Wellness Visit and complete physical.    Her blood pressure has been controlled at home, today their BP is BP: 126/74 She does workout, not as much due to right shoulder pain, following ortho. She denies chest pain, shortness of breath, dizziness.  She is not on cholesterol medication and denies myalgias. Her cholesterol is at goal. The cholesterol last visit was:   Lab Results  Component Value Date   CHOL 225 (H) 12/25/2015   HDL 102 12/25/2015   LDLCALC 109 (H) 12/25/2015   TRIG 72 12/25/2015   CHOLHDL 2.2 12/25/2015    Last A1C in the office was:  Lab Results  Component Value Date   HGBA1C 5.3 09/09/2016   Last GFR: Lab Results  Component Value Date   GFRNONAA >89 12/25/2015   Patient is on Vitamin D supplement.   Lab Results  Component Value Date   VD25OH 69 12/25/2015     BMI is Body mass index is 24.4 kg/m., she is working on diet and exercise. Wt Readings from Last 3 Encounters:  12/25/16 120 lb 12.8 oz (54.8 kg)  12/08/16 118 lb (53.5 kg)  11/20/16 119 lb (54 kg)    Medication Review: Current Outpatient Medications on File Prior to Visit  Medication Sig Dispense Refill  . Ascorbic Acid (VITAMIN C) 100 MG tablet Take 100 mg  by mouth daily.    . Azelaic Acid (FINACEA) 15 % cream Apply topically 2 (two) times daily. After skin is thoroughly washed and patted dry, gently but thoroughly massage a thin film of azelaic acid cream into the affected area twice daily, in the morning and evening.    . Biotin 1 MG CAPS Take 1 capsule by mouth daily.     . calcium carbonate (OS-CAL - DOSED IN MG OF ELEMENTAL CALCIUM) 1250 (500 Ca) MG tablet Take 1 tablet by mouth.    . cholecalciferol (VITAMIN D) 1000  UNITS tablet Take 3,000 Units by mouth daily.     . Flaxseed, Linseed, (FLAX SEED OIL) 1000 MG CAPS Take by mouth.    Marland Kitchen MINIVELLE 0.0375 MG/24HR PLACE 1 PATCH ONTO THE SKIN 2 (TWO) TIMES A WEEK. 24 patch 1  . mirabegron ER (MYRBETRIQ) 50 MG TB24 tablet Take 25 mg by mouth daily.     Marland Kitchen omeprazole (PRILOSEC) 40 MG capsule Take 40 mg by mouth 2 (two) times daily.  3  . polyethylene glycol (MIRALAX) packet Take 17 g by mouth daily as needed.    . Probiotic Product (PROBIOTIC PO) Take by mouth daily.    Marland Kitchen pyridOXINE (VITAMIN B-6) 100 MG tablet Take 100 mg by mouth daily.    . vitamin B-12 (CYANOCOBALAMIN) 100 MCG tablet Take 100 mcg by mouth daily.     No current facility-administered medications on file prior to visit.     Allergies  Allergen Reactions  . Other     Band-aid  . Codeine Hives  . Sulfa Antibiotics Nausea And Vomiting    Current Problems (verified) Patient Active Problem List   Diagnosis Date Noted  . Hearing loss of both ears 12/25/2014  . Hyperlipidemia   . Hypertension   . Prediabetes   . Vitamin D deficiency   . GERD (gastroesophageal reflux disease) 11/17/2011  . Interstitial cystitis     Screening Tests Immunization History  Administered Date(s) Administered  . DTaP 10/04/2010  . Influenza-Unspecified 11/29/2012, 11/29/2013, 11/17/2014, 11/27/2015  . Pneumococcal Conjugate-13 12/21/2013  . Pneumococcal Polysaccharide-23 12/25/2016  . Pneumococcal-Unspecified 12/04/2002  . Zoster 12/03/2005  . Zoster Recombinat (Shingrix) 05/22/2016   Preventative care: Last colonoscopy: 04/27/2013 Dr. Amedeo Plenty Last mammogram: 01/2016 Last pap smear/pelvic exam: 06/27/2013 Dr. Sabra Heck DEXA: 2015 Dr. Sabra Heck AB [place] 10/2016  Prior vaccinations: TD or Tdap: 2012  Influenza: 2018  Pneumococcal: 12/25/2016 Prevnar13: 2015 Shingles/Zostavax: 2007 Shingrex 2018  Names of Other Physician/Practitioners you currently use: 1. [facility]  here for primary care 2. Donato Heinz, eye doctor, last visit [place] 2017, wears glasses 3. Dr. Toy Cookey, dentist, last visit Dr. Toy Cookey  Patient Care Team: Unk Pinto, MD as PCP - General (Internal Medicine) Barbaraann Cao, OD as Referring Physician (Optometry) Teena Irani, MD as Consulting Physician (Gastroenterology) Marlou Sa Tonna Corner, MD as Consulting Physician (Orthopedic Surgery) Berenice Primas, MD as Referring Physician (Orthopedic Surgery) Megan Salon, MD as Consulting Physician (Gynecology) Ardis Hughs, MD as Attending Physician (Urology) Leta Baptist, MD as Consulting Physician (Otolaryngology)  SURGICAL HISTORY She  has a past surgical history that includes Cesarean section; Myringotomy; Lumbar laminectomy; Carpal tunnel release; Trigger finger release; Anal sphincterotomy; Nasal sinus surgery; Abdominal hysterectomy (2003); Hemorrhoid surgery (N/A, 1998); Cystoscopy; Colonoscopy; cortisone injection; Tympanostomy tube placement; Tonsillectomy; Tonsillectomy (10/14); and MICRO LUMBAR DECOMPRESSION L3-4,  REDO DECOMPRESSION, MICRODISCECTOMY L4-5, FORAMINOTOMY L3-L4, L4-L5 BILATERAL (N/A, 08/09/2014).   FAMILY HISTORY Her family history includes Diabetes in her mother;  Hyperlipidemia in her brother and mother; Hypertension in her brother and mother; Osteoporosis in her mother; Prostate cancer in her father.   SOCIAL HISTORY She  reports that she quit smoking about 30 years ago. She has a 7.50 pack-year smoking history. she has never used smokeless tobacco. She reports that she drinks about 3.6 - 4.2 oz of alcohol per week. She reports that she does not use drugs.  MEDICARE WELLNESS OBJECTIVES: Physical activity: Current Exercise Habits: Home exercise routine;Structured exercise class, Type of exercise: walking;strength training/weights, Time (Minutes): 40, Frequency (Times/Week): 5, Weekly Exercise (Minutes/Week): 200, Intensity: Moderate Cardiac risk factors:  Cardiac Risk Factors include: advanced age (>56men, >15 women);dyslipidemia;hypertension Depression/mood screen:   Depression screen [facility] 2/9 12/25/2016  Decreased Interest 0  Down, Depressed, Hopeless 0  PHQ - 2 Score 0    ADLs:  In your present state of health, do you have any difficulty performing the following activities: 12/25/2016  Hearing? N  Vision? N  Difficulty concentrating or making decisions? N  Walking or climbing stairs? N  Dressing or bathing? N  Doing errands, shopping? N  Some recent data might be hidden     Cognitive Testing  Alert? Yes  Normal Appearance?Yes  Oriented to person? Yes  Place? Yes   Time? Yes  Recall of three objects?  Yes  Can perform simple calculations? Yes  Displays appropriate judgment?Yes  Can read the correct time from a watch face?Yes  EOL planning: Does Patient Have a Medical Advance Directive?: Yes Type of Advance Directive: Healthcare Power of Attorney, Living will Copy of [place] in Chart?: No - copy requested  Review of Systems  Constitutional: Negative.   HENT: Negative.   Eyes: Negative.   Respiratory: Negative.   Cardiovascular: Negative.   Gastrointestinal: Negative.   Genitourinary: Negative.   Musculoskeletal: Positive for joint pain. Negative for back pain, falls, myalgias and neck pain.  Skin: Negative.      Objective:     Today's Vitals   12/25/16 1008  BP: 126/74  Pulse: 66  Resp: 14  Temp: 97.6 F (36.4 C)  SpO2: 94%  Weight: 120 lb 12.8 oz (54.8 kg)  Height: 4\' 11"  (1.499 m)  PainSc: 2   PainLoc: Shoulder   Body mass index is 24.4 kg/m.  General appearance: alert, no distress, WD/WN, female HEENT: normocephalic, sclerae anicteric, right TM with tube, left TM with perforation, nares patent, no discharge or erythema, pharynx normal Oral cavity: MMM, no lesions Neck: supple, no lymphadenopathy, no thyromegaly, no masses Heart: RRR, normal S1, S2, no murmurs Lungs: CTA  bilaterally, no wheezes, rhonchi, or rales Abdomen: +bs, soft, non tender, non distended, no masses, no hepatomegaly, no splenomegaly Musculoskeletal: nontender, no swelling, no obvious deformity Extremities: no edema, no cyanosis, no clubbing Pulses: 2+ symmetric, upper and lower extremities, normal cap refill Neurological: alert, oriented x 3, CN2-12 intact, strength normal upper extremities and lower extremities, sensation normal throughout, DTRs 2+ throughout, no cerebellar signs, gait normal Psychiatric: normal affect, behavior normal, pleasant   EKG had 10/27/2016 Needs Aorta Scan  Medicare Attestation I have personally reviewed: The patient's medical and social history Their use of alcohol, tobacco or illicit drugs Their current medications and supplements The patient's functional ability including ADLs,fall risks, home safety risks, cognitive, and hearing and visual impairment Diet and physical activities Evidence for depression or mood disorders  The patient's weight, height, BMI, and visual acuity have been recorded in the chart.  I have made referrals, counseling, and  provided education to the patient based on review of the above and I have provided the patient with a written personalized care plan for preventive services.     Vicie Mutters, PA-C   12/25/2016

## 2016-12-26 ENCOUNTER — Encounter: Payer: Self-pay | Admitting: Physician Assistant

## 2016-12-26 LAB — HEPATIC FUNCTION PANEL
AG Ratio: 1.8 (calc) (ref 1.0–2.5)
ALT: 18 U/L (ref 6–29)
AST: 22 U/L (ref 10–35)
Albumin: 4.4 g/dL (ref 3.6–5.1)
Alkaline phosphatase (APISO): 58 U/L (ref 33–130)
Bilirubin, Direct: 0.1 mg/dL (ref 0.0–0.2)
Globulin: 2.4 g/dL (calc) (ref 1.9–3.7)
Indirect Bilirubin: 0.3 mg/dL (calc) (ref 0.2–1.2)
Total Bilirubin: 0.4 mg/dL (ref 0.2–1.2)
Total Protein: 6.8 g/dL (ref 6.1–8.1)

## 2016-12-26 LAB — MICROALBUMIN / CREATININE URINE RATIO
Creatinine, Urine: 29 mg/dL (ref 20–275)
Microalb, Ur: <0.2 mg/dL

## 2016-12-26 LAB — BASIC METABOLIC PANEL WITH GFR
BUN: 22 mg/dL (ref 7–25)
CO2: 28 mmol/L (ref 20–32)
Calcium: 9.1 mg/dL (ref 8.6–10.4)
Chloride: 104 mmol/L (ref 98–110)
Creat: 0.6 mg/dL (ref 0.50–0.99)
GFR, Est African American: 111 mL/min/{1.73_m2} (ref >=60)
GFR, Est Non African American: 96 mL/min/{1.73_m2} (ref >=60)
Glucose, Bld: 96 mg/dL (ref 65–99)
Potassium: 4.4 mmol/L (ref 3.5–5.3)
Sodium: 139 mmol/L (ref 135–146)

## 2016-12-26 LAB — LIPID PANEL
Cholesterol: 223 mg/dL — ABNORMAL HIGH (ref <200)
HDL: 106 mg/dL (ref >50)
LDL Cholesterol (Calc): 102 mg/dL (calc) — ABNORMAL HIGH
Non-HDL Cholesterol (Calc): 117 mg/dL (calc) (ref <130)
Total CHOL/HDL Ratio: 2.1 (calc) (ref <5.0)
Triglycerides: 63 mg/dL (ref <150)

## 2016-12-26 LAB — CBC WITH DIFFERENTIAL/PLATELET
Basophils Absolute: 50 cells/uL (ref 0–200)
Basophils Relative: 0.9 %
Eosinophils Absolute: 132 cells/uL (ref 15–500)
Eosinophils Relative: 2.4 %
HCT: 38.7 % (ref 35.0–45.0)
Hemoglobin: 13.2 g/dL (ref 11.7–15.5)
Lymphs Abs: 1705 cells/uL (ref 850–3900)
MCH: 32.3 pg (ref 27.0–33.0)
MCHC: 34.1 g/dL (ref 32.0–36.0)
MCV: 94.6 fL (ref 80.0–100.0)
MPV: 12.1 fL (ref 7.5–12.5)
Monocytes Relative: 8.8 %
Neutro Abs: 3130 cells/uL (ref 1500–7800)
Neutrophils Relative %: 56.9 %
Platelets: 257 10*3/uL (ref 140–400)
RBC: 4.09 10*6/uL (ref 3.80–5.10)
RDW: 11.8 % (ref 11.0–15.0)
Total Lymphocyte: 31 %
WBC mixed population: 484 cells/uL (ref 200–950)
WBC: 5.5 10*3/uL (ref 3.8–10.8)

## 2016-12-26 LAB — URINALYSIS, ROUTINE W REFLEX MICROSCOPIC
Bilirubin Urine: NEGATIVE
Glucose, UA: NEGATIVE
Hgb urine dipstick: NEGATIVE
Ketones, ur: NEGATIVE
Leukocytes, UA: NEGATIVE
Nitrite: NEGATIVE
Protein, ur: NEGATIVE
Specific Gravity, Urine: 1.009 (ref 1.001–1.03)
pH: 7 (ref 5.0–8.0)

## 2016-12-26 LAB — TSH: TSH: 1.84 mIU/L (ref 0.40–4.50)

## 2016-12-26 LAB — MAGNESIUM: Magnesium: 2.1 mg/dL (ref 1.5–2.5)

## 2017-01-05 DIAGNOSIS — G8929 Other chronic pain: Secondary | ICD-10-CM | POA: Diagnosis not present

## 2017-01-05 DIAGNOSIS — G5601 Carpal tunnel syndrome, right upper limb: Secondary | ICD-10-CM | POA: Diagnosis not present

## 2017-01-05 DIAGNOSIS — M25521 Pain in right elbow: Secondary | ICD-10-CM | POA: Diagnosis not present

## 2017-01-05 DIAGNOSIS — M79641 Pain in right hand: Secondary | ICD-10-CM | POA: Diagnosis not present

## 2017-01-05 DIAGNOSIS — M25511 Pain in right shoulder: Secondary | ICD-10-CM | POA: Diagnosis not present

## 2017-01-12 ENCOUNTER — Other Ambulatory Visit: Payer: Self-pay

## 2017-01-12 DIAGNOSIS — K219 Gastro-esophageal reflux disease without esophagitis: Secondary | ICD-10-CM

## 2017-01-12 MED ORDER — OMEPRAZOLE 20 MG PO CPDR: 20.0000 mg | DELAYED_RELEASE_CAPSULE | Freq: Two times a day (BID) | ORAL | 0 refills | Status: DC

## 2017-01-16 ENCOUNTER — Other Ambulatory Visit: Payer: Self-pay

## 2017-01-16 ENCOUNTER — Ambulatory Visit (INDEPENDENT_AMBULATORY_CARE_PROVIDER_SITE_OTHER): Payer: Medicare Other | Admitting: Obstetrics & Gynecology

## 2017-01-16 ENCOUNTER — Encounter: Payer: Self-pay | Admitting: Obstetrics & Gynecology

## 2017-01-16 VITALS — BP 124/82 | HR 88 | Resp 14 | Ht 58.5 in | Wt 122.5 lb

## 2017-01-16 DIAGNOSIS — N760 Acute vaginitis: Secondary | ICD-10-CM | POA: Diagnosis not present

## 2017-01-16 DIAGNOSIS — Z124 Encounter for screening for malignant neoplasm of cervix: Secondary | ICD-10-CM | POA: Diagnosis not present

## 2017-01-16 DIAGNOSIS — Z01419 Encounter for gynecological examination (general) (routine) without abnormal findings: Secondary | ICD-10-CM

## 2017-01-16 LAB — NUSWAB VAGINITIS (VG)

## 2017-01-16 MED ORDER — ESTRADIOL 0.0375 MG/24HR TD PTTW: 1.0000 | MEDICATED_PATCH | TRANSDERMAL | 4 refills | Status: DC

## 2017-01-16 NOTE — Progress Notes (Signed)
66 y.o. [id number] MarriedCaucasianF here for annual exam.  Doing well.  Had a recent URI.  Took antibiotics.  Now not sure if she has BV again or not.  Started Metrogel she had at home.  Denies vaginal bleeding.  Patient's last menstrual period was 10/18/2001.          Sexually active: No.  The current method of family planning is status post hysterectomy.    Exercising: Yes.    walking, swimming, biking, small weights Smoker:  Former smoker  Health Maintenance: Pap:  06/27/13 negative, negative HR HPV testing 2013  History of abnormal Pap:  yes MMG:  01/21/16 BIRADS 1 negative.  Has MMG scheduled 01/29/17. Colonoscopy:  04/27/13 repeat 10 years  BMD:   2010  TDaP:  10/04/10  Pneumonia vaccine(s):  12/21/13, 12/25/16  Zostavax:   05/22/16, 09/2016 at [facility] Hep C testing: 12/25/14 negative Screening Labs: PCP, Hb today: PCP, Urine today: not collected    reports that she quit smoking about 30 years ago. She has a 7.50 pack-year smoking history. she has never used smokeless tobacco. She reports that she drinks about 3.6 - 4.2 oz of alcohol per week. She reports that she does not use drugs.  Past Medical History:  Diagnosis Date  . Anxiety   . Arthritis    in neck  . Bursitis of right hip   . Carpal tunnel syndrome, bilateral    rt wrist  . Eagle's syndrome   . GERD (gastroesophageal reflux disease)   . Heart murmur    as child  . HNP (herniated nucleus pulposus)   . Interstitial cystitis   . Kidney cysts   . Osteopenia   . Plantar fasciitis   . Spinal stenosis   . Tingling    rt foot  . Wears hearing aid     Past Surgical History:  Procedure Laterality Date  . ABDOMINAL HYSTERECTOMY  2003   BSO  . ANAL SPHINCTEROTOMY    . CARPAL TUNNEL RELEASE     LEFT  . CESAREAN SECTION    . COLONOSCOPY     internal hemorrhoids  . cortisone injection     in neck  . CYSTOSCOPY    . [place]  . LUMBAR LAMINECTOMY    . LUMBAR LAMINECTOMY/DECOMPRESSION MICRODISCECTOMY N/A  08/09/2014   Procedure: MICRO LUMBAR DECOMPRESSION L3-4,  REDO DECOMPRESSION, MICRODISCECTOMY L4-5, FORAMINOTOMY L3-L4, L4-L5 BILATERAL;  Surgeon: Susa Day, MD;  Location: [facility];  Service: Orthopedics;  Laterality: N/A;  . MYRINGOTOMY    . NASAL SINUS SURGERY    . TONSILLECTOMY     LEFT only, due to Eagle Syndrome  . TONSILLECTOMY  10/14   right side due to Eagles syndrome  . TRIGGER FINGER RELEASE     RIGHT  . TYMPANOSTOMY TUBE PLACEMENT      Current Outpatient Medications  Medication Sig Dispense Refill  . Ascorbic Acid (VITAMIN C) 100 MG tablet Take 100 mg by mouth daily.    . Azelaic Acid (FINACEA) 15 % cream Apply topically 2 (two) times daily. After skin is thoroughly washed and patted dry, gently but thoroughly massage a thin film of azelaic acid cream into the affected area twice daily, in the morning and evening.    . Biotin 1 MG CAPS Take 1 capsule by mouth daily.     . calcium carbonate (OS-CAL - DOSED IN MG OF ELEMENTAL CALCIUM) 1250 (500 Ca) MG tablet Take 1 tablet by mouth.    . cholecalciferol (VITAMIN  D) 1000 UNITS tablet Take 3,000 Units by mouth daily.     . Flaxseed, Linseed, (FLAX SEED OIL) 1000 MG CAPS Take by mouth.    . metroNIDAZOLE (METROGEL) 0.75 % vaginal gel INSERT 1 APPLICATORFUL VAGINALLY AT BEDTIME TWICE WEEKLY  0  . MINIVELLE 0.0375 MG/24HR PLACE 1 PATCH ONTO THE SKIN 2 (TWO) TIMES A WEEK. 24 patch 1  . mirabegron ER (MYRBETRIQ) 50 MG TB24 tablet Take 25 mg by mouth daily.     Marland Kitchen omeprazole (PRILOSEC) 20 MG capsule Take 1 capsule (20 mg total) by mouth 2 (two) times daily before a meal. 180 capsule 0  . polyethylene glycol (MIRALAX) packet Take 17 g by mouth daily as needed.    . Probiotic Product (PROBIOTIC PO) Take by mouth daily.    Marland Kitchen pyridOXINE (VITAMIN B-6) 100 MG tablet Take 100 mg by mouth daily.    . vitamin B-12 (CYANOCOBALAMIN) 100 MCG tablet Take 100 mcg by mouth daily.     No current facility-administered medications for this visit.      Family History  Problem Relation Age of Onset  . Diabetes Mother   . Hypertension Mother   . Hyperlipidemia Mother   . Osteoporosis Mother   . Hypertension Brother   . Hyperlipidemia Brother   . Prostate cancer Father        Bone    ROS:  Pertinent items are noted in HPI.  Otherwise, a comprehensive ROS was negative.  Exam:   BP 124/82 (BP Location: Right Arm, Patient Position: Sitting, Cuff Size: Normal)   Pulse 88   Resp 14   Ht 4' 10.5" (1.486 m)   Wt 122 lb 8 oz (55.6 kg)   [name] 10/18/2001   BMI 25.17 kg/m   Height: 4' 10.5" (148.6 cm)  Ht Readings from Last 3 Encounters:  01/16/17 4' 10.5" (1.486 m)  12/25/16 4\' 11"  (1.499 m)  12/08/16 4\' 11"  (1.499 m)    General appearance: alert, cooperative and appears stated age Head: Normocephalic, without obvious abnormality, atraumatic Neck: no adenopathy, supple, symmetrical, trachea midline and thyroid normal to inspection and palpation Lungs: clear to auscultation bilaterally Breasts: normal appearance, no masses or tenderness Heart: regular rate and rhythm Abdomen: soft, non-tender; bowel sounds normal; no masses,  no organomegaly Extremities: extremities normal, atraumatic, no cyanosis or edema Skin: Skin color, texture, turgor normal. No rashes or lesions Lymph nodes: Cervical, supraclavicular, and axillary nodes normal. No abnormal inguinal nodes palpated Neurologic: Grossly normal   Pelvic: External genitalia:  no lesions              Urethra:  normal appearing urethra with no masses, tenderness or lesions              Bartholins and Skenes: normal                 Vagina: normal appearing vagina with normal color and discharge, no lesions              Cervix: absent              Pap taken: No. Bimanual Exam:  Uterus:  uterus absent              Adnexa: normal adnexa and no mass, fullness, tenderness               Rectovaginal: Confirms               Anus:  normal sphincter tone, no lesions  Chaperone was  present for exam.  A:  Well Woman with normal exam PMP, on HRT H/o TAH/BSO  Recurrent vaginitis Grade C breast density Spinal stenosis Mild osteopenia Interstitial cystitis  P:   Mammogram guidelines reviewed.  Doing 3D. pap smear not obtained Use metrogel tonight.  Nuswab pending.  Results will be called to patient RX for Vivelle dot 0.0375mg  patches twice weekly.  #24/4RF.  She stays on this primarily due to her I.C. RF for myrbetriq 50mg  daily.  Pt is splitting in half.   Vaccines/lab work otherwise UTD return annually or prn

## 2017-01-20 DIAGNOSIS — H903 Sensorineural hearing loss, bilateral: Secondary | ICD-10-CM | POA: Diagnosis not present

## 2017-01-20 DIAGNOSIS — H7203 Central perforation of tympanic membrane, bilateral: Secondary | ICD-10-CM | POA: Diagnosis not present

## 2017-01-20 DIAGNOSIS — H838X3 Other specified diseases of inner ear, bilateral: Secondary | ICD-10-CM | POA: Diagnosis not present

## 2017-01-20 DIAGNOSIS — H6983 Other specified disorders of Eustachian tube, bilateral: Secondary | ICD-10-CM | POA: Diagnosis not present

## 2017-01-21 LAB — SPECIMEN STATUS REPORT

## 2017-01-21 LAB — NUSWAB BV AND CANDIDA, NAA
Candida albicans, NAA: NEGATIVE
Candida glabrata, NAA: NEGATIVE

## 2017-01-22 ENCOUNTER — Encounter: Payer: Self-pay | Admitting: Obstetrics & Gynecology

## 2017-01-29 DIAGNOSIS — Z1231 Encounter for screening mammogram for malignant neoplasm of breast: Secondary | ICD-10-CM | POA: Diagnosis not present

## 2017-01-29 DIAGNOSIS — M8589 Other specified disorders of bone density and structure, multiple sites: Secondary | ICD-10-CM | POA: Diagnosis not present

## 2017-01-29 LAB — HM DEXA SCAN

## 2017-01-29 LAB — HM MAMMOGRAPHY

## 2017-02-05 ENCOUNTER — Encounter: Payer: Self-pay | Admitting: *Deleted

## 2017-02-06 ENCOUNTER — Encounter: Payer: Self-pay | Admitting: Obstetrics & Gynecology

## 2017-02-12 ENCOUNTER — Encounter: Payer: Self-pay | Admitting: Obstetrics & Gynecology

## 2017-02-13 ENCOUNTER — Telehealth: Payer: Self-pay | Admitting: Obstetrics & Gynecology

## 2017-02-13 NOTE — Telephone Encounter (Signed)
----   Message from [place], Generic sent at 02/12/2017 1:39 PM EST -----    I received two messages from [facility] Chart that my bone density test results are available. When I click on the test results, I don't see anything there...same for the mammogram, but the imaging facility called before Christmas to let me know those results were okay. Perhaps you have to review the bone density results before they are available for me to see.    Thanks for getting back to me when time allows,  Helen Glover

## 2017-02-13 NOTE — Telephone Encounter (Signed)
BMD results placed in Dr. Ammie Ferrier result folder for review.   Forwarding to Dr. Sabra Heck.

## 2017-02-13 NOTE — Telephone Encounter (Signed)
Spoke with patient, requesting BMD results from 01/29/17 BMD at [facility]. Advised patient results not received, will call [facility] to request, will return call to review once reviewed by Dr. Sabra Heck. Patient verbalizes understanding and is agreeable.   Call to [facility], requested copy of BMD results dated 01/29/17 be faxed to [phone].

## 2017-02-16 NOTE — Telephone Encounter (Signed)
Please let pt know her BMD showed mild osteopenia.  It was actually improved from her prior testing and just showed mild osteopenia.  I would recommend repeating this in 3-4 years.

## 2017-02-18 NOTE — Telephone Encounter (Signed)
Spoke with patient, advised as seen below per Dr. Sabra Heck. Patient verbalizes understanding and is agreeable. Will close encounter.

## 2017-02-20 ENCOUNTER — Telehealth: Payer: Self-pay | Admitting: Obstetrics & Gynecology

## 2017-02-20 NOTE — Telephone Encounter (Signed)
Routing to Dr. Miller FYI.  

## 2017-02-20 NOTE — Telephone Encounter (Signed)
Left message to call Samiyyah Moffa at [phone].  

## 2017-02-20 NOTE — Telephone Encounter (Signed)
Patient called and requested an appointment with Dr. Sabra Heck for next week. She said she is having continued vaginal burning and itching. Patient declined to schedule an appointment for today and expressed she'd only like to see Dr. Sabra Heck for this problem.

## 2017-02-20 NOTE — Telephone Encounter (Signed)
Called and spoke with patient and scheduled her for 9:15 AM on Monday, 02/24/16, with Dr. Sabra Heck, there was a cancellation in the schedule.

## 2017-02-23 ENCOUNTER — Ambulatory Visit (INDEPENDENT_AMBULATORY_CARE_PROVIDER_SITE_OTHER): Payer: PPO | Admitting: Obstetrics & Gynecology

## 2017-02-23 ENCOUNTER — Encounter: Payer: Self-pay | Admitting: Obstetrics & Gynecology

## 2017-02-23 ENCOUNTER — Other Ambulatory Visit: Payer: Self-pay

## 2017-02-23 VITALS — BP 110/70 | HR 72 | Resp 16 | Ht 58.5 in | Wt 122.0 lb

## 2017-02-23 DIAGNOSIS — N760 Acute vaginitis: Secondary | ICD-10-CM | POA: Diagnosis not present

## 2017-02-23 NOTE — Progress Notes (Signed)
GYNECOLOGY  VISIT  CC:   Vaginal itching  HPI: 67 y.o. G53P1102 Married Caucasian female here for persistent vaginal itching.  Reports symptoms seems to have returned.  Having some vaginal itching as well.  Having mild discharge.  Feels like symptoms are back but not completely sure.  Denies vaginal bleeding.  Bladder issues are under good control.  No dysuria or urinary urgency lately.  Bowel function is normal.  GYNECOLOGIC HISTORY: Patient's last menstrual period was 10/18/2001. Contraception: post menopausal Menopausal hormone therapy: none  Patient Active Problem List   Diagnosis Date Noted  . Hearing loss of both ears 12/25/2014  . Hyperlipidemia   . Hypertension   . Prediabetes   . Vitamin D deficiency   . GERD (gastroesophageal reflux disease) 11/17/2011  . Interstitial cystitis     Past Medical History:  Diagnosis Date  . Anxiety   . Arthritis    in neck  . Bursitis of right hip   . Carpal tunnel syndrome, bilateral    rt wrist  . Eagle's syndrome   . GERD (gastroesophageal reflux disease)   . Heart murmur    as child  . HNP (herniated nucleus pulposus)   . Interstitial cystitis   . Kidney cysts   . Osteopenia   . Plantar fasciitis   . Spinal stenosis   . Tingling    rt foot  . Wears hearing aid     Past Surgical History:  Procedure Laterality Date  . ABDOMINAL HYSTERECTOMY  2003   BSO  . ANAL SPHINCTEROTOMY    . CARPAL TUNNEL RELEASE     LEFT  . CESAREAN SECTION    . CYSTOSCOPY    . [place]  . LUMBAR LAMINECTOMY    . LUMBAR LAMINECTOMY/DECOMPRESSION MICRODISCECTOMY N/A 08/09/2014   Procedure: MICRO LUMBAR DECOMPRESSION L3-4,  REDO DECOMPRESSION, MICRODISCECTOMY L4-5, FORAMINOTOMY L3-L4, L4-L5 BILATERAL;  Surgeon: Susa Day, MD;  Location: [facility];  Service: Orthopedics;  Laterality: N/A;  . MYRINGOTOMY    . NASAL SINUS SURGERY    . TONSILLECTOMY     LEFT only, due to Eagle Syndrome  . TONSILLECTOMY  10/14   right side due  to Eagles syndrome  . TRIGGER FINGER RELEASE     RIGHT  . TYMPANOSTOMY TUBE PLACEMENT      MEDS:   Current Outpatient Medications on File Prior to Visit  Medication Sig Dispense Refill  . Ascorbic Acid (VITAMIN C) 100 MG tablet Take 100 mg by mouth daily.    . Biotin 1 MG CAPS Take 1 capsule by mouth daily.     . calcium carbonate (OS-CAL - DOSED IN MG OF ELEMENTAL CALCIUM) 1250 (500 Ca) MG tablet Take 1 tablet by mouth.    . cholecalciferol (VITAMIN D) 1000 UNITS tablet Take 3,000 Units by mouth daily.     Marland Kitchen estradiol (VIVELLE-DOT) 0.0375 MG/24HR Place 1 patch onto the skin 2 (two) times a week. 24 patch 4  . Flaxseed, Linseed, (FLAX SEED OIL) 1000 MG CAPS Take by mouth.    . mirabegron ER (MYRBETRIQ) 50 MG TB24 tablet Take 25 mg by mouth daily.     Marland Kitchen omeprazole (PRILOSEC) 20 MG capsule Take 1 capsule (20 mg total) by mouth 2 (two) times daily before a meal. 180 capsule 0  . polyethylene glycol (MIRALAX) packet Take 17 g by mouth daily as needed.    . Probiotic Product (PROBIOTIC PO) Take by mouth daily.    Marland Kitchen pyridOXINE (VITAMIN B-6) 100 MG tablet  Take 100 mg by mouth daily.    . vitamin B-12 (CYANOCOBALAMIN) 100 MCG tablet Take 100 mcg by mouth daily.     No current facility-administered medications on file prior to visit.     ALLERGIES: Other; Codeine; and Sulfa antibiotics  Family History  Problem Relation Age of Onset  . Diabetes Mother   . Hypertension Mother   . Hyperlipidemia Mother   . Osteoporosis Mother   . Hypertension Brother   . Hyperlipidemia Brother   . Prostate cancer Father        Bone    SH:  Married, non smoker  Review of Systems  All other systems reviewed and are negative.   PHYSICAL EXAMINATION:    BP 110/70 (BP Location: Right Arm, Patient Position: Sitting, Cuff Size: Normal)   Pulse 72   Resp 16   Ht 4' 10.5" (1.486 m)   Wt 122 lb (55.3 kg)   [name] 10/18/2001   BMI 25.06 kg/m     General appearance: alert, cooperative and appears stated  age Abdomen: soft, non-tender; bowel sounds normal; no masses,  no organomegaly  Pelvic: External genitalia:  no lesions              Urethra:  normal appearing urethra with no masses, tenderness or lesions              Bartholins and Skenes: normal                 Vagina: normal appearing vagina with normal color and discharge, no lesions              Cervix: absent              Bimanual Exam:  Uterus:  uterus absent              Adnexa: no mass, fullness, tenderness              Rectovaginal: No.              Anus:  normal sphincter tone, no lesions  Chaperone was present for exam.  Assessment: Recurrent BV with possible symptoms again  Plan: Nuswab testing will be obtained today.  If negative, will proceed with vaginal estrogen therapy.  Pt is using patch for HRT but may not be enough to maintain vaginal health.  Pt comfortable with plan.   ~15 minutes spent with patient >50% of time was in face to face discussion of above.

## 2017-02-25 LAB — BACTERIAL VAGINOSIS, NAA

## 2017-02-26 ENCOUNTER — Telehealth: Payer: Self-pay | Admitting: Obstetrics & Gynecology

## 2017-02-26 ENCOUNTER — Encounter: Payer: Self-pay | Admitting: Obstetrics & Gynecology

## 2017-02-26 NOTE — Telephone Encounter (Signed)
Spoke with patient. Advised on results dated 02/23/17 for BV -negative.  Patient reports symptoms are still the same - "fishy" vaginal odor, burning and itching.   Advised Dr. Sabra Heck will review labs, I will return call with any additional recommendations. Patient verbalizes understanding and is agreeable.   Dr. Sabra Heck -please review and advise?

## 2017-02-26 NOTE — Telephone Encounter (Signed)
-----   Message from [place], Generic sent at 02/26/2017 10:49 AM EST -----    Just wondering if the results from my swab/test have come back...I believe the sample went out at noon on Monday...    Thanks,  Pam

## 2017-02-26 NOTE — Telephone Encounter (Signed)
She does not have yeast or BV.  She and I discussed trial of topical estrogen.  Ok to call in estrace vaginal cream 1gm pv twice weekly.  Ok to use small amount externally as well.  Recheck 2 months.

## 2017-02-27 ENCOUNTER — Encounter: Payer: Self-pay | Admitting: Obstetrics & Gynecology

## 2017-02-27 MED ORDER — ESTRADIOL 0.1 MG/GM VA CREA: TOPICAL_CREAM | VAGINAL | 0 refills | Status: DC

## 2017-02-27 NOTE — Telephone Encounter (Signed)
Spoke with patient, advised as seen below per Dr. Sabra Heck. Rx sent to [facility] on file. OV scheduled for 04/27/17 at 9:15am with Dr. Sabra Heck. Patient verbalizes understanding and is agreeable. Will close encounter.

## 2017-02-27 NOTE — Telephone Encounter (Signed)
-----   Message from [place], Generic sent at 02/27/2017 6:59 AM EST -----    Since the test was negative for BV, do you think I should try the estrogen cream you mentioned? I believe you said something about a skin biopsy, but perhaps after the cream. I am open to suggestions, of course.    Thanks,  Helen Glover

## 2017-02-27 NOTE — Telephone Encounter (Signed)
Left message to call Jarod Bozzo at [phone].  

## 2017-03-02 ENCOUNTER — Telehealth: Payer: Self-pay | Admitting: Obstetrics & Gynecology

## 2017-03-02 NOTE — Telephone Encounter (Signed)
PA submitted for estradiol 0.0375 mg patch via [email or web address]

## 2017-03-02 NOTE — Telephone Encounter (Signed)
Patient is asking for our office to call  [place] [phone] for prior authorization for her estradiol.

## 2017-03-03 NOTE — Telephone Encounter (Signed)
Spoke with patient, advised PA submitted for estradiol patch, can take up to 72 hrs for decision, will notify once recived. Patient has 1 mo supply of patches, verbalizes understanding and is agreeable.

## 2017-03-03 NOTE — Telephone Encounter (Signed)
Faxed notification received for Estradiol 0.0375 mg patch. Approved 03/03/17 -02/16/18.   Spoke with patient, advised of approval. Patient verbalizes understanding and is agreeable.   Routing to provider for final review. Patient is agreeable to disposition. Will close encounter.

## 2017-03-05 ENCOUNTER — Encounter: Payer: Self-pay | Admitting: Internal Medicine

## 2017-03-09 ENCOUNTER — Encounter: Payer: Self-pay | Admitting: Obstetrics & Gynecology

## 2017-03-09 DIAGNOSIS — M7541 Impingement syndrome of right shoulder: Secondary | ICD-10-CM | POA: Insufficient documentation

## 2017-03-09 DIAGNOSIS — M25531 Pain in right wrist: Secondary | ICD-10-CM | POA: Diagnosis not present

## 2017-03-09 DIAGNOSIS — G5601 Carpal tunnel syndrome, right upper limb: Secondary | ICD-10-CM | POA: Diagnosis not present

## 2017-03-09 DIAGNOSIS — M25532 Pain in left wrist: Secondary | ICD-10-CM | POA: Diagnosis not present

## 2017-03-12 ENCOUNTER — Encounter: Payer: Self-pay | Admitting: *Deleted

## 2017-03-24 DIAGNOSIS — Z411 Encounter for cosmetic surgery: Secondary | ICD-10-CM | POA: Diagnosis not present

## 2017-03-24 DIAGNOSIS — D225 Melanocytic nevi of trunk: Secondary | ICD-10-CM | POA: Diagnosis not present

## 2017-03-24 DIAGNOSIS — Z86018 Personal history of other benign neoplasm: Secondary | ICD-10-CM | POA: Diagnosis not present

## 2017-03-24 DIAGNOSIS — Z23 Encounter for immunization: Secondary | ICD-10-CM | POA: Diagnosis not present

## 2017-03-24 DIAGNOSIS — L821 Other seborrheic keratosis: Secondary | ICD-10-CM | POA: Diagnosis not present

## 2017-03-24 DIAGNOSIS — L719 Rosacea, unspecified: Secondary | ICD-10-CM | POA: Diagnosis not present

## 2017-04-07 DIAGNOSIS — J0101 Acute recurrent maxillary sinusitis: Secondary | ICD-10-CM | POA: Diagnosis not present

## 2017-04-07 DIAGNOSIS — J310 Chronic rhinitis: Secondary | ICD-10-CM | POA: Diagnosis not present

## 2017-04-12 DIAGNOSIS — W19XXXA Unspecified fall, initial encounter: Secondary | ICD-10-CM | POA: Diagnosis not present

## 2017-04-12 DIAGNOSIS — S60222A Contusion of left hand, initial encounter: Secondary | ICD-10-CM | POA: Diagnosis not present

## 2017-04-12 NOTE — Progress Notes (Signed)
Assessment and Plan:  Helen Glover was seen today for back pain.  Diagnoses and all orders for this visit:  Mid back pain Not improved with NSAIDs or prednisone; limited to night time, ? Related to current bed - recommended stretches/exercises, rotate mattress, will obtain XR, discussed possible PT -     cyclobenzaprine (FLEXERIL) 5 MG tablet; Take 1 tablet (5 mg total) by mouth 3 (three) times daily as needed for muscle spasms. -     DG Thoracic Spine W/Swimmers; Future  Further disposition pending results of labs. Discussed med's effects and SE's.   Over 15 minutes of exam, counseling, chart review, and critical decision making was performed.   Future Appointments  Date Time Provider [place]  04/27/2017  9:15 AM Megan Salon, MD [place] None  01/04/2018 10:00 AM Vicie Mutters, PA-C [id number] None  03/30/2018  1:30 PM Megan Salon, MD [place] None    ------------------------------------------------------------------------------------------------------------------   HPI BP 126/74   Pulse 77   Temp 97.7 F (36.5 C)   Ht 4' 10.5" (1.486 m)   Wt 123 lb (55.8 kg)   [name] 10/18/2001   SpO2 97%   BMI 25.27 kg/m   66 y.o.female presents for intermittent mid/upper back pain bilaterally - she reports 5/10 - "shooting" "throbbing" but not radiating, without changes in sensation/numbness/tingling or notable muscle weakness. She reports this is ongoing intermittently since June/2018 - worse at night/lying down to sleep, rarely bothers her during the day, worse after she swims/exercises. She reports pain is improved sometimes with sleeping on her side rather than on her back.   with hx of spinal stenosis and herniated disc, s/p multiple lumbar decompressions - most recently in 2016 by Dr. Maxie Better - decompression at L3-4, microdiscectomy L4-5, foraminotomy L3-4, L4-5 bilaterally - presents for back pain.   She is recently coming off of a prednisone taper; she reports this did not seem  to help. She takes aleve/tylenol daily for other aches/pain which has also been unhelpful. She reports repositioning in bed is helpful and typically allows her to fall back asleep.    Past Medical History:  Diagnosis Date  . Anxiety   . Arthritis    in neck  . Bursitis of right hip   . Carpal tunnel syndrome, bilateral    rt wrist  . Eagle's syndrome   . GERD (gastroesophageal reflux disease)   . Heart murmur    as child  . HNP (herniated nucleus pulposus)   . Interstitial cystitis   . Kidney cysts   . Osteopenia   . Plantar fasciitis   . Spinal stenosis   . Tingling    rt foot  . Wears hearing aid      Allergies  Allergen Reactions  . Other     Band-aid  . Codeine Hives  . Sulfa Antibiotics Nausea And Vomiting    Current Outpatient Medications on File Prior to Visit  Medication Sig  . Ascorbic Acid (VITAMIN C) 100 MG tablet Take 100 mg by mouth daily.  . Biotin 1 MG CAPS Take 1 capsule by mouth daily.   . calcium carbonate (OS-CAL - DOSED IN MG OF ELEMENTAL CALCIUM) 1250 (500 Ca) MG tablet Take 1 tablet by mouth.  . cholecalciferol (VITAMIN D) 1000 UNITS tablet Take 3,000 Units by mouth daily.   Marland Kitchen estradiol (ESTRACE VAGINAL) 0.1 MG/GM vaginal cream 1 gram pv twice weekly.  Marland Kitchen estradiol (VIVELLE-DOT) 0.0375 MG/24HR Place 1 patch onto the skin 2 (two) times a week.  Marland Kitchen  Flaxseed, Linseed, (FLAX SEED OIL) 1000 MG CAPS Take by mouth.  . mirabegron ER (MYRBETRIQ) 50 MG TB24 tablet Take 25 mg by mouth daily.   Marland Kitchen omeprazole (PRILOSEC) 20 MG capsule Take 1 capsule (20 mg total) by mouth 2 (two) times daily before a meal.  . polyethylene glycol (MIRALAX) packet Take 17 g by mouth daily as needed.  . Probiotic Product (PROBIOTIC PO) Take by mouth daily.  Marland Kitchen pyridOXINE (VITAMIN B-6) 100 MG tablet Take 100 mg by mouth daily.  . vitamin B-12 (CYANOCOBALAMIN) 100 MCG tablet Take 100 mcg by mouth daily.   No current facility-administered medications on file prior to visit.     ROS:  all negative except above.   Physical Exam:  BP 126/74   Pulse 77   Temp 97.7 F (36.5 C)   Ht 4' 10.5" (1.486 m)   Wt 123 lb (55.8 kg)   [name] 10/18/2001   SpO2 97%   BMI 25.27 kg/m   General Appearance: Well nourished, in no apparent distress. Neck: Supple.  Respiratory: Respiratory effort normal, BS equal bilaterally without rales, rhonchi, wheezing or stridor.  Cardio: RRR with no MRGs. Brisk peripheral pulses without edema.  Abdomen: Soft, + BS.  Non tender. Lymphatics: Non tender without lymphadenopathy.  Musculoskeletal: Full ROM, 5/5 strength, normal gait. No midline tenderness over spinous processes, indicates pain when present is around T7-8 area paraspinally. Non-tender over musculature.  Skin: Warm, dry without rashes, lesions, ecchymosis.  Neuro: Cranial nerves intact. Normal muscle tone, no cerebellar symptoms. Sensation intact.  Psych: Awake and oriented X 3, normal affect, Insight and Judgment appropriate.     [name], NP 5:37 PM [facility]

## 2017-04-13 ENCOUNTER — Ambulatory Visit (INDEPENDENT_AMBULATORY_CARE_PROVIDER_SITE_OTHER): Payer: PPO | Admitting: Adult Health

## 2017-04-13 ENCOUNTER — Encounter: Payer: Self-pay | Admitting: Adult Health

## 2017-04-13 VITALS — BP 126/74 | HR 77 | Temp 97.7°F | Ht 58.5 in | Wt 123.0 lb

## 2017-04-13 DIAGNOSIS — M549 Dorsalgia, unspecified: Secondary | ICD-10-CM

## 2017-04-13 MED ORDER — CYCLOBENZAPRINE HCL 5 MG PO TABS: 5.0000 mg | ORAL_TABLET | Freq: Three times a day (TID) | ORAL | 0 refills | Status: DC | PRN

## 2017-04-13 NOTE — Patient Instructions (Signed)
Try the exercises and other information in the back care manual, take -  NSAIDS like Alleve or Ibuprofen while taking this) and then flexeril if needed at bedtime for muscle spasm. This can be taken up to every 8 hours, but causes sedation, so should not drive or operate heavy machinery while taking this medicine.   Go to the ER if you have any new weakness in your legs, have trouble controlling your urine or bowels, or have worsening pain.   If you are not better in 1-3 month we will refer you to ortho   Back pain Rehab Ask your health care provider which exercises are safe for you. Do exercises exactly as told by your health care provider and adjust them as directed. It is normal to feel mild stretching, pulling, tightness, or discomfort as you do these exercises, but you should stop right away if you feel sudden pain or your pain gets worse.Do not begin these exercises until told by your health care provider. Stretching and range of motion exercises These exercises warm up your muscles and joints and improve the movement and flexibility of your hips and your back. These exercises also help to relieve pain, numbness, and tingling. Exercise A: Sciatic nerve glide 1. Sit in a chair with your head facing down toward your chest. Place your hands behind your back. Let your shoulders slump forward. 2. Slowly straighten one of your knees while you tilt your head back as if you are looking toward the ceiling. Only straighten your leg as far as you can without making your symptoms worse. 3. Hold for __________ seconds. 4. Slowly return to the starting position. 5. Repeat with your other leg. Repeat __________ times. Complete this exercise __________ times a day. Exercise B: Knee to chest with hip adduction and internal rotation  1. Lie on your back on a firm surface with both legs straight. 2. Bend one of your knees and move it up toward your chest until you feel a gentle stretch in your lower back  and buttock. Then, move your knee toward the shoulder that is on the opposite side from your leg. ? Hold your leg in this position by holding onto the front of your knee. 3. Hold for __________ seconds. 4. Slowly return to the starting position. 5. Repeat with your other leg. Repeat __________ times. Complete this exercise __________ times a day. Exercise C: Prone extension on elbows  1. Lie on your abdomen on a firm surface. A bed may be too soft for this exercise. 2. Prop yourself up on your elbows. 3. Use your arms to help lift your chest up until you feel a gentle stretch in your abdomen and your lower back. ? This will place some of your body weight on your elbows. If this is uncomfortable, try stacking pillows under your chest. ? Your hips should stay down, against the surface that you are lying on. Keep your hip and back muscles relaxed. 4. Hold for __________ seconds. 5. Slowly relax your upper body and return to the starting position. Repeat __________ times. Complete this exercise __________ times a day. Strengthening exercises These exercises build strength and endurance in your back. Endurance is the ability to use your muscles for a long time, even after they get tired. Exercise D: Pelvic tilt 1. Lie on your back on a firm surface. Bend your knees and keep your feet flat. 2. Tense your abdominal muscles. Tip your pelvis up toward the ceiling and flatten your lower  back into the floor. ? To help with this exercise, you may place a small towel under your lower back and try to push your back into the towel. 3. Hold for __________ seconds. 4. Let your muscles relax completely before you repeat this exercise. Repeat __________ times. Complete this exercise __________ times a day. Exercise E: Alternating arm and leg raises  1. Get on your hands and knees on a firm surface. If you are on a hard floor, you may want to use padding to cushion your knees, such as an exercise  mat. 2. Line up your arms and legs. Your hands should be below your shoulders, and your knees should be below your hips. 3. Lift your left leg behind you. At the same time, raise your right arm and straighten it in front of you. ? Do not lift your leg higher than your hip. ? Do not lift your arm higher than your shoulder. ? Keep your abdominal and back muscles tight. ? Keep your hips facing the ground. ? Do not arch your back. ? Keep your balance carefully, and do not hold your breath. 4. Hold for __________ seconds. 5. Slowly return to the starting position and repeat with your right leg and your left arm. Repeat __________ times. Complete this exercise __________ times a day. Posture and body mechanics  Body mechanics refers to the movements and positions of your body while you do your daily activities. Posture is part of body mechanics. Good posture and healthy body mechanics can help to relieve stress in your body's tissues and joints. Good posture means that your spine is in its natural S-curve position (your spine is neutral), your shoulders are pulled back slightly, and your head is not tipped forward. The following are general guidelines for applying improved posture and body mechanics to your everyday activities. Standing   When standing, keep your spine neutral and your feet about hip-width apart. Keep a slight bend in your knees. Your ears, shoulders, and hips should line up.  When you do a task in which you stand in one place for a long time, place one foot up on a stable object that is 2-4 inches (5-10 cm) high, such as a footstool. This helps keep your spine neutral. Sitting   When sitting, keep your spine neutral and keep your feet flat on the floor. Use a footrest, if necessary, and keep your thighs parallel to the floor. Avoid rounding your shoulders, and avoid tilting your head forward.  When working at a desk or a computer, keep your desk at a height where your hands are  slightly lower than your elbows. Slide your chair under your desk so you are close enough to maintain good posture.  When working at a computer, place your monitor at a height where you are looking straight ahead and you do not have to tilt your head forward or downward to look at the screen. Resting   When lying down and resting, avoid positions that are most painful for you.  If you have pain with activities such as sitting, bending, stooping, or squatting (flexion-based activities), lie in a position in which your body does not bend very much. For example, avoid curling up on your side with your arms and knees near your chest (fetal position).  If you have pain with activities such as standing for a long time or reaching with your arms (extension-based activities), lie with your spine in a neutral position and bend your knees slightly. Try  the following positions: ? Lying on your side with a pillow between your knees. ? Lying on your back with a pillow under your knees. Lifting   When lifting objects, keep your feet at least shoulder-width apart and tighten your abdominal muscles.  Bend your knees and hips and keep your spine neutral. It is important to lift using the strength of your legs, not your back. Do not lock your knees straight out.  Always ask for help to lift heavy or awkward objects. This information is not intended to replace advice given to you by your health care provider. Make sure you discuss any questions you have with your health care provider. Document Released: 02/03/2005 Document Revised: 10/11/2015 Document Reviewed: 10/20/2014 Elsevier Interactive Patient Education  [employer].

## 2017-04-14 ENCOUNTER — Ambulatory Visit (HOSPITAL_COMMUNITY)
Admission: RE | Admit: 2017-04-14 | Discharge: 2017-04-14 | Disposition: A | Discharge status: Home / Self Care | Payer: PPO | Source: Ambulatory Visit | Attending: Adult Health | Admitting: Adult Health

## 2017-04-14 ENCOUNTER — Encounter: Payer: Self-pay | Admitting: Adult Health

## 2017-04-14 DIAGNOSIS — M5134 Other intervertebral disc degeneration, thoracic region: Secondary | ICD-10-CM | POA: Diagnosis not present

## 2017-04-14 DIAGNOSIS — M546 Pain in thoracic spine: Secondary | ICD-10-CM | POA: Insufficient documentation

## 2017-04-14 DIAGNOSIS — M549 Dorsalgia, unspecified: Secondary | ICD-10-CM

## 2017-04-14 DIAGNOSIS — M419 Scoliosis, unspecified: Secondary | ICD-10-CM | POA: Diagnosis not present

## 2017-04-20 ENCOUNTER — Telehealth: Payer: Self-pay | Admitting: Obstetrics & Gynecology

## 2017-04-20 NOTE — Telephone Encounter (Signed)
Left patient a message to call back to reschedule a future appointment that was cancelled by the provider for a two month med recheck with Dr. Sabra Heck.

## 2017-04-27 ENCOUNTER — Ambulatory Visit: Payer: Self-pay | Admitting: Obstetrics & Gynecology

## 2017-04-28 ENCOUNTER — Encounter: Payer: Self-pay | Admitting: Obstetrics & Gynecology

## 2017-04-28 ENCOUNTER — Ambulatory Visit (INDEPENDENT_AMBULATORY_CARE_PROVIDER_SITE_OTHER): Payer: PPO | Admitting: Obstetrics & Gynecology

## 2017-04-28 VITALS — BP 110/72 | HR 64 | Resp 14 | Wt 123.0 lb

## 2017-04-28 DIAGNOSIS — N760 Acute vaginitis: Secondary | ICD-10-CM

## 2017-04-28 NOTE — Progress Notes (Signed)
GYNECOLOGY  VISIT  CC:   Recheck vulva/vagina  HPI: 67 y.o. G16P1102 Married Caucasian female here for vaginal estrogen follow up.  Does feel symptoms still "come and go" at times.  She thinks, at times, it must be related to something in her urine because she does feel she has some symptoms after urinating.  Has been trying to figure out if there are any "triggers".  Finished the estrogen cream last week.  Didn't think this "hurt" but it didn't really make that big of a difference.  Denies internal symptoms and denies vaginal bleeding or discharge.    GYNECOLOGIC HISTORY: Patient's last menstrual period was 10/18/2001. Contraception: post menopausal  Menopausal hormone therapy: estrace vag cream, vivelle dot.   Patient Active Problem List   Diagnosis Date Noted  . Hearing loss of both ears 12/25/2014  . Hyperlipidemia   . Hypertension   . Prediabetes   . Vitamin D deficiency   . GERD (gastroesophageal reflux disease) 11/17/2011  . Interstitial cystitis     Past Medical History:  Diagnosis Date  . Anxiety   . Arthritis    in neck  . Bursitis of right hip   . Carpal tunnel syndrome, bilateral    rt wrist  . Eagle's syndrome   . GERD (gastroesophageal reflux disease)   . Heart murmur    as child  . HNP (herniated nucleus pulposus)   . Interstitial cystitis   . Kidney cysts   . Osteopenia   . Plantar fasciitis   . Spinal stenosis   . Tingling    rt foot  . Wears hearing aid     Past Surgical History:  Procedure Laterality Date  . ABDOMINAL HYSTERECTOMY  2003   BSO  . ANAL SPHINCTEROTOMY    . CARPAL TUNNEL RELEASE     LEFT  . CESAREAN SECTION    . CYSTOSCOPY    . [place]  . LUMBAR LAMINECTOMY    . LUMBAR LAMINECTOMY/DECOMPRESSION MICRODISCECTOMY N/A 08/09/2014   Procedure: MICRO LUMBAR DECOMPRESSION L3-4,  REDO DECOMPRESSION, MICRODISCECTOMY L4-5, FORAMINOTOMY L3-L4, L4-L5 BILATERAL;  Surgeon: Susa Day, MD;  Location: [facility];  Service:  Orthopedics;  Laterality: N/A;  . MYRINGOTOMY    . NASAL SINUS SURGERY    . TONSILLECTOMY     LEFT only, due to Eagle Syndrome  . TONSILLECTOMY  10/14   right side due to Eagles syndrome  . TRIGGER FINGER RELEASE     RIGHT  . TYMPANOSTOMY TUBE PLACEMENT      MEDS:   Current Outpatient Medications on File Prior to Visit  Medication Sig Dispense Refill  . Ascorbic Acid (VITAMIN C) 100 MG tablet Take 100 mg by mouth daily.    . Biotin 1 MG CAPS Take 1 capsule by mouth daily.     . calcium carbonate (OS-CAL - DOSED IN MG OF ELEMENTAL CALCIUM) 1250 (500 Ca) MG tablet Take 1 tablet by mouth.    . cholecalciferol (VITAMIN D) 1000 UNITS tablet Take 3,000 Units by mouth daily.     . cyclobenzaprine (FLEXERIL) 5 MG tablet Take 1 tablet (5 mg total) by mouth 3 (three) times daily as needed for muscle spasms. 60 tablet 0  . estradiol (ESTRACE VAGINAL) 0.1 MG/GM vaginal cream 1 gram pv twice weekly. 42.5 g 0  . estradiol (VIVELLE-DOT) 0.0375 MG/24HR Place 1 patch onto the skin 2 (two) times a week. 24 patch 4  . Flaxseed, Linseed, (FLAX SEED OIL) 1000 MG CAPS Take by mouth.    Marland Kitchen  mirabegron ER (MYRBETRIQ) 50 MG TB24 tablet Take 25 mg by mouth every other day.     Marland Kitchen omeprazole (PRILOSEC) 20 MG capsule Take 1 capsule (20 mg total) by mouth 2 (two) times daily before a meal. 180 capsule 0  . polyethylene glycol (MIRALAX) packet Take 17 g by mouth daily as needed.    . Probiotic Product (PROBIOTIC PO) Take by mouth daily.    Marland Kitchen pyridOXINE (VITAMIN B-6) 100 MG tablet Take 100 mg by mouth daily.    . vitamin B-12 (CYANOCOBALAMIN) 100 MCG tablet Take 100 mcg by mouth daily.     No current facility-administered medications on file prior to visit.     ALLERGIES: Other; Codeine; and Sulfa antibiotics  Family History  Problem Relation Age of Onset  . Diabetes Mother   . Hypertension Mother   . Hyperlipidemia Mother   . Osteoporosis Mother   . Hypertension Brother   . Hyperlipidemia Brother   .  Prostate cancer Father        Bone    SH:  Married, non smoker  Review of Systems  All other systems reviewed and are negative.   PHYSICAL EXAMINATION:    BP 110/72 (BP Location: Left Arm, Patient Position: Sitting, Cuff Size: Normal)   Pulse 64   Resp 14   Wt 123 lb (55.8 kg)   [name] 10/18/2001   BMI 25.27 kg/m     General appearance: alert, cooperative and appears stated age Abdomen: soft, non-tender; bowel sounds normal; no masses,  no organomegaly  Pelvic: External genitalia:  no lesions              Urethra:  normal appearing urethra with no masses, tenderness or lesions              Bartholins and Skenes: normal                 Vagina: normal appearing vagina with normal color and discharge, no lesions              Cervix: absent              Bimanual Exam:  Uterus:  uterus absent              Adnexa: not evaluated  Chaperone was present for exam.  Assessment: H/o recurrent BV, no symptoms today Vaginal atrophic changes  Plan: Will stop vulvar/vaginal estrogen for now.  She will let me know with if she has any new symptoms   ~15 minutes spent with patient >50% of time was in face to face discussion of above.

## 2017-05-22 ENCOUNTER — Telehealth: Payer: Self-pay | Admitting: Obstetrics & Gynecology

## 2017-05-22 NOTE — Telephone Encounter (Signed)
Patient complaining of vaginal itching/burning again and would like appointment to see Dr.Miller. Made appointment for 05-25-17 2:00pm.

## 2017-05-22 NOTE — Telephone Encounter (Signed)
Patient says she think she may have another infection.

## 2017-05-25 ENCOUNTER — Ambulatory Visit (INDEPENDENT_AMBULATORY_CARE_PROVIDER_SITE_OTHER): Payer: PPO | Admitting: Obstetrics & Gynecology

## 2017-05-25 ENCOUNTER — Encounter: Payer: Self-pay | Admitting: Obstetrics & Gynecology

## 2017-05-25 VITALS — BP 126/80 | HR 92 | Resp 16 | Ht 58.5 in | Wt 124.0 lb

## 2017-05-25 DIAGNOSIS — N898 Other specified noninflammatory disorders of vagina: Secondary | ICD-10-CM

## 2017-05-25 NOTE — Progress Notes (Signed)
GYNECOLOGY  VISIT  CC:   Vulvar and vaginal itching  HPI: 67 y.o. G59P1102 Married Caucasian female here for vaginal and vulvar itching.  This is most notable after urinating.  Will occasionally have what feels like vaginal pain--"zinngers".  Also feels like she has some vaginal odor.  This does not seem like the urination symptoms are not related to IC.    Asked if there is "any other cause possible".  Reviewed vulvar skin diagnoses that could be possible but she's never had any visible skin changes and her symptoms are both vaginal and vulvar.  Atrophy could be cause.  On estradiol 0.0375mg  patch but not using any topical estrogen so this may need to be considered if testing today is again negative.    Denies vaginal bleeding.    GYNECOLOGIC HISTORY: Patient's last menstrual period was 10/18/2001. Contraception: hysterectomy  Menopausal hormone therapy: none  Patient Active Problem List   Diagnosis Date Noted  . Hearing loss of both ears 12/25/2014  . Hyperlipidemia   . Hypertension   . Prediabetes   . Vitamin D deficiency   . GERD (gastroesophageal reflux disease) 11/17/2011  . Interstitial cystitis     Past Medical History:  Diagnosis Date  . Anxiety   . Arthritis    in neck  . Bursitis of right hip   . Carpal tunnel syndrome, bilateral    rt wrist  . Eagle's syndrome   . GERD (gastroesophageal reflux disease)   . Heart murmur    as child  . HNP (herniated nucleus pulposus)   . Interstitial cystitis   . Kidney cysts   . Osteopenia   . Plantar fasciitis   . Spinal stenosis   . Tingling    rt foot  . Wears hearing aid     Past Surgical History:  Procedure Laterality Date  . ABDOMINAL HYSTERECTOMY  2003   BSO  . ANAL SPHINCTEROTOMY    . CARPAL TUNNEL RELEASE     LEFT  . CESAREAN SECTION    . CYSTOSCOPY    . [place]  . LUMBAR LAMINECTOMY    . LUMBAR LAMINECTOMY/DECOMPRESSION MICRODISCECTOMY N/A 08/09/2014   Procedure: MICRO LUMBAR  DECOMPRESSION L3-4,  REDO DECOMPRESSION, MICRODISCECTOMY L4-5, FORAMINOTOMY L3-L4, L4-L5 BILATERAL;  Surgeon: Susa Day, MD;  Location: [facility];  Service: Orthopedics;  Laterality: N/A;  . MYRINGOTOMY    . NASAL SINUS SURGERY    . TONSILLECTOMY     LEFT only, due to Eagle Syndrome  . TONSILLECTOMY  10/14   right side due to Eagles syndrome  . TRIGGER FINGER RELEASE     RIGHT  . TYMPANOSTOMY TUBE PLACEMENT      MEDS:   Current Outpatient Medications on File Prior to Visit  Medication Sig Dispense Refill  . Ascorbic Acid (VITAMIN C) 100 MG tablet Take 100 mg by mouth daily.    . Biotin 1 MG CAPS Take 1 capsule by mouth daily.     . calcium carbonate (OS-CAL - DOSED IN MG OF ELEMENTAL CALCIUM) 1250 (500 Ca) MG tablet Take 1 tablet by mouth.    . cholecalciferol (VITAMIN D) 1000 UNITS tablet Take 3,000 Units by mouth daily.     . cyclobenzaprine (FLEXERIL) 5 MG tablet Take 1 tablet (5 mg total) by mouth 3 (three) times daily as needed for muscle spasms. 60 tablet 0  . estradiol (ESTRACE VAGINAL) 0.1 MG/GM vaginal cream 1 gram pv twice weekly. 42.5 g 0  . estradiol (VIVELLE-DOT) 0.0375 MG/24HR  Place 1 patch onto the skin 2 (two) times a week. 24 patch 4  . Flaxseed, Linseed, (FLAX SEED OIL) 1000 MG CAPS Take by mouth.    . mirabegron ER (MYRBETRIQ) 50 MG TB24 tablet Take 25 mg by mouth every other day.     Marland Kitchen omeprazole (PRILOSEC) 20 MG capsule Take 1 capsule (20 mg total) by mouth 2 (two) times daily before a meal. 180 capsule 0  . polyethylene glycol (MIRALAX) packet Take 17 g by mouth daily as needed.    . Probiotic Product (PROBIOTIC PO) Take by mouth daily.    Marland Kitchen pyridOXINE (VITAMIN B-6) 100 MG tablet Take 100 mg by mouth daily.    . vitamin B-12 (CYANOCOBALAMIN) 100 MCG tablet Take 100 mcg by mouth daily.     No current facility-administered medications on file prior to visit.     ALLERGIES: Other; Codeine; and Sulfa antibiotics  Family History  Problem Relation Age of Onset  .  Diabetes Mother   . Hypertension Mother   . Hyperlipidemia Mother   . Osteoporosis Mother   . Hypertension Brother   . Hyperlipidemia Brother   . Prostate cancer Father        Bone    SH:  Married, non smoker  Review of Systems  Gastrointestinal:       Rectal itching   Genitourinary:       Vaginal itching Skin burning with urination  All other systems reviewed and are negative.   PHYSICAL EXAMINATION:    BP 126/80 (BP Location: Left Arm, Patient Position: Sitting, Cuff Size: Normal)   Pulse 92   Resp 16   Ht 4' 10.5" (1.486 m)   Wt 124 lb (56.2 kg)   [name] 10/18/2001   BMI 25.47 kg/m     General appearance: alert, cooperative and appears stated age Inguinal:  No inguinal LAD noted  Pelvic: External genitalia:  no lesions              Urethra:  normal appearing urethra with no masses, tenderness or lesions              Bartholins and Skenes: normal                 Vagina: normal pale in coloration but no visible discharge present, mild odor present, no lesions visulaized              Cervix: absent              Bimanual Exam:  Uterus:  uterus absent              Adnexa: no mass, fullness, tenderness  Chaperone was present for exam.  Assessment: Vaginal/vulvar itching Skin burning with urination H/O recurrent BV  Plan: Nuswab for BV obtained.  Results will be called to pt.  If negative, consider using vaginal estrogen to help symptoms

## 2017-05-27 LAB — BACTERIAL VAGINOSIS, NAA

## 2017-05-28 ENCOUNTER — Other Ambulatory Visit: Payer: Self-pay | Admitting: *Deleted

## 2017-05-28 MED ORDER — ESTRADIOL 0.1 MG/GM VA CREA: TOPICAL_CREAM | VAGINAL | 1 refills | Status: DC

## 2017-05-29 ENCOUNTER — Encounter: Payer: Self-pay | Admitting: Obstetrics & Gynecology

## 2017-05-29 ENCOUNTER — Telehealth: Payer: Self-pay | Admitting: Obstetrics & Gynecology

## 2017-05-29 NOTE — Telephone Encounter (Signed)
Patient sent the following correspondence through [place]. Routing to triage to assist patient with request.  ----- Message from Abram, Generic sent at 05/29/2017 8:34 AM EDT -----    I will begin using the estrogen cream tonight, but did want to let you know the odor you smelled on Monday does seem stronger. Can the need for estrogen also cause an odor? I seem to have almost non-stop symptoms now, as to ones that "came and went" in the previous weeks. I will monitor them in the next several weeks as requested by your nurse.    Thanks,  Helen Glover

## 2017-05-29 NOTE — Telephone Encounter (Signed)
Spoke with patient. Reports vaginal odor, itching and burning. Odor initially seemed stronger this morning, has improved throughout morning. Denies vaginal d/c, bleeding or urinary complaints. Patient will start vaginal estrogen cream tonight as previously recommended, is aware to return call to office for OV if new symptoms develop, symptoms do not resolve or worsen. Advised Dr. Sabra Heck will review, I will return call with any additional recommendations.   Routing to provider for final review. Patient is agreeable to disposition. Will close encounter.

## 2017-05-29 NOTE — Telephone Encounter (Signed)
Left message to call Donnalynn Wheeless at [phone].  

## 2017-06-15 ENCOUNTER — Ambulatory Visit (INDEPENDENT_AMBULATORY_CARE_PROVIDER_SITE_OTHER): Payer: PPO | Admitting: Adult Health

## 2017-06-15 ENCOUNTER — Encounter: Payer: Self-pay | Admitting: Adult Health

## 2017-06-15 VITALS — BP 130/80 | HR 67 | Temp 97.7°F | Ht 58.5 in | Wt 125.0 lb

## 2017-06-15 DIAGNOSIS — J0101 Acute recurrent maxillary sinusitis: Secondary | ICD-10-CM | POA: Diagnosis not present

## 2017-06-15 MED ORDER — PREDNISONE 20 MG PO TABS: ORAL_TABLET | ORAL | 0 refills | Status: DC

## 2017-06-15 MED ORDER — AZITHROMYCIN 250 MG PO TABS: ORAL_TABLET | ORAL | 1 refills | Status: AC

## 2017-06-15 NOTE — Progress Notes (Signed)
Assessment and Plan:  Acute recurrent maxillary sinusitis Recurrent sinusitis followed by ENT; will treat despite short duration to hx of recurrent severe infections lasting 1+ month Nasal saline spray for congestion, continue with mucinex, expectorant Nasal steroids, oral steroids Declines cough medication Follow up as needed. -     azithromycin (ZITHROMAX) 250 MG tablet; Take 2 tablets (500 mg) on  Day 1,  followed by 1 tablet (250 mg) once daily on Days 2 through 5. -     predniSONE (DELTASONE) 20 MG tablet; 2 tablets daily for 3 days, 1 tablet daily for 4 days.  Further disposition pending results of labs. Discussed med's effects and SE's.   Over 15 minutes of exam, counseling, chart review, and critical decision making was performed.   Future Appointments  Date Time Provider [place]  01/04/2018 10:00 AM Vicie Mutters, PA-C [id number] None  03/30/2018  1:30 PM Megan Salon, MD [place] None    ------------------------------------------------------------------------------------------------------------------   HPI BP 130/80   Pulse 67   Temp 97.7 F (36.5 C)   Ht 4' 10.5" (1.486 m)   Wt 125 lb (56.7 kg)   [name] 10/18/2001   SpO2 99%   BMI 25.68 kg/m   67 y.o.female with no significant respiratory hx presents for 6 days of progressive URI symptoms; began as scratchy throat, hoarseness, lost voice until 3 days ago then developed a productive cough (yellow/green) and nasal congestion, now having left sided maxillary pain and teeth aching. She feels symptoms are progressively worsening. Denies fever/chills, nausea, vomiting, vision changes, headaches. Denies seasonal allergies, itching, sneezing.   She has tried mucinex with expectorant, phenylephrine, saline irrications, xycam/afrin. She is followed by Dr. Benjamine Mola (ENT) with upcoming appointment.  Has hx of sinus surgery x 3. Report hx of recurrent sinusitis, last in Oct 2018, reports typically treated by zpak and  prednisone with good results.    Past Medical History:  Diagnosis Date  . Anxiety   . Arthritis    in neck  . Bursitis of right hip   . Carpal tunnel syndrome, bilateral    rt wrist  . Eagle's syndrome   . GERD (gastroesophageal reflux disease)   . Heart murmur    as child  . HNP (herniated nucleus pulposus)   . Interstitial cystitis   . Kidney cysts   . Osteopenia   . Plantar fasciitis   . Spinal stenosis   . Tingling    rt foot  . Wears hearing aid      Allergies  Allergen Reactions  . Other     Band-aid  . Codeine Hives  . Sulfa Antibiotics Nausea And Vomiting    Current Outpatient Medications on File Prior to Visit  Medication Sig  . Ascorbic Acid (VITAMIN C) 100 MG tablet Take 100 mg by mouth daily.  . Biotin 1 MG CAPS Take 1 capsule by mouth daily.   . calcium carbonate (OS-CAL - DOSED IN MG OF ELEMENTAL CALCIUM) 1250 (500 Ca) MG tablet Take 1 tablet by mouth.  . cholecalciferol (VITAMIN D) 1000 UNITS tablet Take 3,000 Units by mouth daily.   Marland Kitchen estradiol (ESTRACE VAGINAL) 0.1 MG/GM vaginal cream 1 gram pv twice weekly.  Marland Kitchen estradiol (VIVELLE-DOT) 0.0375 MG/24HR Place 1 patch onto the skin 2 (two) times a week.  . Flaxseed, Linseed, (FLAX SEED OIL) 1000 MG CAPS Take by mouth.  . mirabegron ER (MYRBETRIQ) 50 MG TB24 tablet Take 25 mg by mouth every other day.   Marland Kitchen omeprazole (PRILOSEC) 20 MG  capsule Take 1 capsule (20 mg total) by mouth 2 (two) times daily before a meal.  . polyethylene glycol (MIRALAX) packet Take 17 g by mouth daily as needed.  . Probiotic Product (PROBIOTIC PO) Take by mouth daily.  Marland Kitchen pyridOXINE (VITAMIN B-6) 100 MG tablet Take 100 mg by mouth daily.  . vitamin B-12 (CYANOCOBALAMIN) 100 MCG tablet Take 100 mcg by mouth daily.  . cyclobenzaprine (FLEXERIL) 5 MG tablet Take 1 tablet (5 mg total) by mouth 3 (three) times daily as needed for muscle spasms. (Patient not taking: Reported on 06/15/2017)   No current facility-administered medications on  file prior to visit.     ROS: all negative except above.   Physical Exam:  BP 130/80   Pulse 67   Temp 97.7 F (36.5 C)   Ht 4' 10.5" (1.486 m)   Wt 125 lb (56.7 kg)   [name] 10/18/2001   SpO2 99%   BMI 25.68 kg/m   General Appearance: Well nourished, in no apparent distress. Eyes: PERRLA, EOMs, conjunctiva no swelling or erythema Sinuses: Left sided maxillary sinus tenderness ENT/Mouth: Ext aud canals clear, TMs without erythema, bulging; left TM with perforation (baseline per patient), right with tube present. No erythema, swelling, or exudate on post pharynx.  Tonsils absent. Hearing normal.  Neck: Supple.  Respiratory: Respiratory effort normal, BS equal bilaterally without rales, rhonchi, wheezing or stridor.  Cardio: RRR with no MRGs. Brisk peripheral pulses without edema.  Abdomen: Soft, + BS.  Non tender. Lymphatics: Non tender without lymphadenopathy.  Musculoskeletal: Symmetrical strength, normal gait.  Skin: Warm, dry without rashes, lesions, ecchymosis.  Psych: Awake and oriented X 3, normal affect, Insight and Judgment appropriate.     [name], NP 3:45 PM [facility]

## 2017-06-15 NOTE — Patient Instructions (Signed)

## 2017-06-26 ENCOUNTER — Encounter: Payer: Self-pay | Admitting: Internal Medicine

## 2017-07-11 ENCOUNTER — Other Ambulatory Visit: Payer: Self-pay | Admitting: Physician Assistant

## 2017-07-11 DIAGNOSIS — K219 Gastro-esophageal reflux disease without esophagitis: Secondary | ICD-10-CM

## 2017-07-18 ENCOUNTER — Encounter: Payer: Self-pay | Admitting: Obstetrics & Gynecology

## 2017-07-21 DIAGNOSIS — R070 Pain in throat: Secondary | ICD-10-CM | POA: Diagnosis not present

## 2017-07-21 DIAGNOSIS — H903 Sensorineural hearing loss, bilateral: Secondary | ICD-10-CM | POA: Diagnosis not present

## 2017-07-21 DIAGNOSIS — H7203 Central perforation of tympanic membrane, bilateral: Secondary | ICD-10-CM | POA: Diagnosis not present

## 2017-07-21 DIAGNOSIS — K219 Gastro-esophageal reflux disease without esophagitis: Secondary | ICD-10-CM | POA: Diagnosis not present

## 2017-07-24 DIAGNOSIS — M7541 Impingement syndrome of right shoulder: Secondary | ICD-10-CM | POA: Diagnosis not present

## 2017-08-11 DIAGNOSIS — M7541 Impingement syndrome of right shoulder: Secondary | ICD-10-CM | POA: Diagnosis not present

## 2017-08-18 DIAGNOSIS — M7541 Impingement syndrome of right shoulder: Secondary | ICD-10-CM | POA: Diagnosis not present

## 2017-08-18 DIAGNOSIS — M25531 Pain in right wrist: Secondary | ICD-10-CM | POA: Diagnosis not present

## 2017-08-18 DIAGNOSIS — M7501 Adhesive capsulitis of right shoulder: Secondary | ICD-10-CM | POA: Diagnosis not present

## 2017-08-18 DIAGNOSIS — M75101 Unspecified rotator cuff tear or rupture of right shoulder, not specified as traumatic: Secondary | ICD-10-CM | POA: Diagnosis not present

## 2017-08-25 ENCOUNTER — Ambulatory Visit (INDEPENDENT_AMBULATORY_CARE_PROVIDER_SITE_OTHER): Payer: PPO | Admitting: Internal Medicine

## 2017-08-25 VITALS — BP 130/82 | HR 76 | Temp 97.2°F | Resp 16 | Ht 58.5 in | Wt 124.0 lb

## 2017-08-25 DIAGNOSIS — Z87898 Personal history of other specified conditions: Secondary | ICD-10-CM

## 2017-08-25 DIAGNOSIS — R0989 Other specified symptoms and signs involving the circulatory and respiratory systems: Secondary | ICD-10-CM | POA: Diagnosis not present

## 2017-08-25 DIAGNOSIS — M67911 Unspecified disorder of synovium and tendon, right shoulder: Secondary | ICD-10-CM | POA: Diagnosis not present

## 2017-08-25 DIAGNOSIS — E782 Mixed hyperlipidemia: Secondary | ICD-10-CM

## 2017-08-25 NOTE — Progress Notes (Signed)
This very nice 66 y.o. MWF  presents for a pre -op evaluation in anticipation of Rt shoulder surgery by Dr Tonita Cong.  Patient reports Rt shoulder pain about a year that she attributes to an episode of ackward lifting. She had recent shoulder MRI confirming internal derangement      Patient denies any recent respiratory sx's as cough or head/chest congestion or dyspnea.  Patient has no cardiac hx or sx's.  Patient has been followed expectantly with very remote elevated BP which never required treatment, hx/o HLD, Pre-Diabetes and Vitamin D Deficiency.      Today's BP is at goal - 130/82. Patient has had no complaints of any cardiac type chest pain, palpitations, dyspnea / orthopnea / PND, dizziness, claudication, or dependent edema.     Hyperlipidemia is near controlled with diet. Last Lipids were near goal with very elevated HDL 106 and LDL 102.  Lab Results  Component Value Date   CHOL 223 (H) 12/25/2016   HDL 106 12/25/2016   LDLCALC 102 (H) 12/25/2016   TRIG 63 12/25/2016   CHOLHDL 2.1 12/25/2016      Also, the patient has history of PreDiabetes (A1c 5.9%/2012) and has had no symptoms of reactive hypoglycemia, diabetic polys, paresthesias or visual blurring.  Last A1c was Normal & at goal: Lab Results  Component Value Date   HGBA1C 5.3 09/09/2016      Further, the patient also has history of Vitamin D Deficiency ("40"/2008) and supplements vitamin D without any suspected side-effects. Last vitamin D was at goal:   Lab Results  Component Value Date   VD25OH 69 12/25/2015   Current Outpatient Medications on File Prior to Visit  Medication Sig  . Ascorbic Acid (VITAMIN C) 100 MG tablet Take 100 mg by mouth daily.  . Biotin 1 MG CAPS Take 1 capsule by mouth daily.   . calcium carbonate (OS-CAL - DOSED IN MG OF ELEMENTAL CALCIUM) 1250 (500 Ca) MG tablet Take 1 tablet by mouth.  . cholecalciferol (VITAMIN D) 1000 UNITS tablet Take 3,000 Units by mouth daily.   . cyclobenzaprine  (FLEXERIL) 5 MG tablet Take 1 tablet (5 mg total) by mouth 3 (three) times daily as needed for muscle spasms.  Marland Kitchen estradiol (ESTRACE VAGINAL) 0.1 MG/GM vaginal cream 1 gram pv twice weekly.  Marland Kitchen estradiol (VIVELLE-DOT) 0.0375 MG/24HR Place 1 patch onto the skin 2 (two) times a week.  . Flaxseed, Linseed, (FLAX SEED OIL) 1000 MG CAPS Take by mouth.  . mirabegron ER (MYRBETRIQ) 25 MG TB24 tablet Take 25 mg by mouth daily.  Marland Kitchen omeprazole (PRILOSEC) 20 MG capsule TAKE 1 CAPSULE (20 MG TOTAL) BY MOUTH 2 (TWO) TIMES DAILY BEFORE A MEAL.  Marland Kitchen polyethylene glycol (MIRALAX) packet Take 17 g by mouth daily as needed.  . Probiotic Product (PROBIOTIC PO) Take by mouth daily.  Marland Kitchen pyridOXINE (VITAMIN B-6) 100 MG tablet Take 100 mg by mouth daily.  . vitamin B-12 (CYANOCOBALAMIN) 100 MCG tablet Take 100 mcg by mouth daily.   No current facility-administered medications on file prior to visit.    Allergies  Allergen Reactions  . Other     Band-aid  . Codeine Hives  . Sulfa Antibiotics Nausea And Vomiting   PMHx:   Past Medical History:  Diagnosis Date  . Anxiety   . Arthritis    in neck  . Bursitis of right hip   . Carpal tunnel syndrome, bilateral    rt wrist  . Eagle's syndrome   .  GERD (gastroesophageal reflux disease)   . Heart murmur    as child  . HNP (herniated nucleus pulposus)   . Interstitial cystitis   . Kidney cysts   . Osteopenia   . Plantar fasciitis   . Spinal stenosis   . Tingling    rt foot  . Wears hearing aid    Immunization History  Administered Date(s) Administered  . DTaP 10/04/2010  . Influenza,inj,quad, With Preservative 11/17/2016  . Influenza-Unspecified 11/29/2012, 11/29/2013, 11/17/2014, 11/27/2015, 11/24/2016  . Pneumococcal Conjugate-13 12/21/2013  . Pneumococcal Polysaccharide-23 12/25/2016  . Pneumococcal-Unspecified 12/04/2002  . Zoster 12/03/2005  . Zoster Recombinat (Shingrix) 05/22/2016   Past Surgical History:  Procedure Laterality Date  .  ABDOMINAL HYSTERECTOMY  2003   BSO  . ANAL SPHINCTEROTOMY    . CARPAL TUNNEL RELEASE     LEFT  . CESAREAN SECTION    . CYSTOSCOPY    . [place]  . LUMBAR LAMINECTOMY    . LUMBAR LAMINECTOMY/DECOMPRESSION MICRODISCECTOMY N/A 08/09/2014   Procedure: MICRO LUMBAR DECOMPRESSION L3-4,  REDO DECOMPRESSION, MICRODISCECTOMY L4-5, FORAMINOTOMY L3-L4, L4-L5 BILATERAL;  Surgeon: Susa Day, MD;  Location: [facility];  Service: Orthopedics;  Laterality: N/A;  . MYRINGOTOMY    . NASAL SINUS SURGERY    . TONSILLECTOMY     LEFT only, due to Eagle Syndrome  . TONSILLECTOMY  10/14   right side due to Eagles syndrome  . TRIGGER FINGER RELEASE     RIGHT  . TYMPANOSTOMY TUBE PLACEMENT     FHx:    Reviewed / unchanged  SHx:    Reviewed / unchanged   Systems Review:  Constitutional: Denies fever, chills, wt changes, headaches, insomnia, fatigue, night sweats, change in appetite. Eyes: Denies redness, blurred vision, diplopia, discharge, itchy, watery eyes.  ENT: Denies discharge, congestion, post nasal drip, epistaxis, sore throat, earache, hearing loss, dental pain, tinnitus, vertigo, sinus pain, snoring.  CV: Denies chest pain, palpitations, irregular heartbeat, syncope, dyspnea, diaphoresis, orthopnea, PND, claudication or edema. Respiratory: denies cough, dyspnea, DOE, pleurisy, hoarseness, laryngitis, wheezing.  Gastrointestinal: Denies dysphagia, odynophagia, heartburn, reflux, water brash, abdominal pain or cramps, nausea, vomiting, bloating, diarrhea, constipation, hematemesis, melena, hematochezia  or hemorrhoids. Genitourinary: Denies dysuria, frequency, urgency, nocturia, hesitancy, discharge, hematuria or flank pain. Musculoskeletal: Chronic neck pain . Rt shoulder pains as above.  Skin: Denies pruritus, rash, hives, warts, acne, eczema or change in skin lesion(s). Neuro: No weakness, tremor, incoordination, spasms, paresthesia or pain. Psychiatric: Denies confusion,  memory loss or sensory loss. Endo: Denies change in weight, skin or hair change.  Heme/Lymph: No excessive bleeding, bruising or enlarged lymph nodes.  Physical Exam  BP 130/82   Pulse 76   Temp (!) 97.2 F (36.2 C)   Resp 16   Ht 4' 10.5" (1.486 m)   Wt 124 lb (56.2 kg)   [name] 10/18/2001   BMI 25.47 kg/m   Appears  well nourished, well groomed  and in no distress.  Eyes: PERRLA, EOMs, conjunctiva no swelling or erythema. Sinuses: No frontal/maxillary tenderness ENT/Mouth: EAC's clear, TM's with chronic perforation of  Lt TM and a tube in thee Rt TM. Nares clear w/o erythema, swelling, exudates. Oropharynx clear without erythema or exudates. Oral hygiene is good. Tongue normal, non obstructing. Hearing intact.  Neck: Supple. Thyroid not palpable. Car 2+/2+ without bruits, nodes or JVD. Chest: Respirations nl with BS clear & equal w/o rales, rhonchi, wheezing or stridor.  Cor: Heart sounds normal w/ regular rate and rhythm without sig. murmurs,  gallops, clicks or rubs. Peripheral pulses normal and equal  without edema.  Abdomen: Soft & bowel sounds normal. Non-tender w/o guarding, rebound, hernias, masses or organomegaly.  Lymphatics: Unremarkable.  Musculoskeletal: Decreased ROM of Rt Shoulder due to pain.  Normal sensory/motor and CN exam. Normal gait.  Skin: Warm, dry without exposed rashes, lesions or ecchymosis apparent.  Neuro: Cranial nerves intact, reflexes equal bilaterally. Sensory-motor testing grossly intact. Tendon reflexes grossly intact.  Pysch: Alert & oriented x 3.  Insight and judgement nl & appropriate. No ideations.  Assessment and Plan:  1. Rotator cuff disorder, right  2. Labile hypertension  - Continue  monitor blood pressure at home.   3. Hyperlipidemia, mixed  - Continue diet, exercise,& lifestyle modifications.  - Continue monitor periodic cholesterol panels  4. History of prediabetes - Continue diet, exercise, lifestyle  modifications.     Patient is felt in excellent health and has no contraindication for anticipated shoulder surgery with recommendations only for routine peri- /post-operative monitoring and anti-thrombosis prophylaxis.          Discussed  regular exercise, BP monitoring, weight control to maintain BMI less than 25.  Over 30 minutes of exam, counseling, chart review was performed.

## 2017-08-26 ENCOUNTER — Encounter: Payer: Self-pay | Admitting: Obstetrics & Gynecology

## 2017-08-26 ENCOUNTER — Encounter: Payer: Self-pay | Admitting: Internal Medicine

## 2017-08-27 ENCOUNTER — Telehealth: Payer: Self-pay | Admitting: *Deleted

## 2017-08-27 NOTE — Telephone Encounter (Signed)
Spoke with patient. Reports external vaginal itching and odor. Denies vaginal d/c or bleeding. Still using estradiol cream and estradiol patch. Requesting OV. OV scheduled for 7/15 at 2:45pm, patient declined earlier OV.   Routing to provider for final review. Patient is agreeable to disposition. Will close encounter.

## 2017-08-27 NOTE — Telephone Encounter (Signed)
My Chart message from patient:  ----- Message from [place]eyo, Generic sent at 08/27/2017 8:29 AM EDT -----    My surgery won't be until mid-August, I think...still working on scheduling. My symptoms are not completely gone even with the cream...but they are better. Would it be possible to check for abnormalities again, sometime soon, just for reassurance that all is "okay," just bothersome. Itching is the main symptom...    I will let Dr. Tonita Cong know all is okay "bone wise" for surgery.    Thanks,  Helen Glover    Thanks,  Helen Glover

## 2017-08-27 NOTE — Telephone Encounter (Signed)
See phone message. Called patient to schedule appointment.

## 2017-08-27 NOTE — Telephone Encounter (Signed)
Call to patient to schedule office visit after reviewing [facility] Chart message. No answer at home number. Call to cell, has name confirmation, left message to call back and ask for triage nurse.

## 2017-08-31 ENCOUNTER — Encounter: Payer: Self-pay | Admitting: Obstetrics & Gynecology

## 2017-08-31 ENCOUNTER — Ambulatory Visit (INDEPENDENT_AMBULATORY_CARE_PROVIDER_SITE_OTHER): Payer: PPO | Admitting: Obstetrics & Gynecology

## 2017-08-31 VITALS — BP 122/78 | HR 76 | Resp 14 | Ht 58.5 in | Wt 123.4 lb

## 2017-08-31 DIAGNOSIS — N898 Other specified noninflammatory disorders of vagina: Secondary | ICD-10-CM | POA: Diagnosis not present

## 2017-08-31 NOTE — Progress Notes (Signed)
GYNECOLOGY  VISIT  CC:   Vaginal odor and vaginal itching  HPI: 67 y.o. [id number] Married Caucasian female here for vaginal itching and odor that is much, much better compared to about a year ago.  She has hx of recurrent BV but last several tests have been negative.  She is still using the estrogen vaginal cream twice weekly and this has really made a difference with external irritation she was experiencing.  She does report intermittent vaginal odor.  For example, she had some yesterday but not today.  Wonders if food choices make a difference.    She is having rotator cuff surgery on August 26th with Dr. Tonita Cong.  She is going to go on vacation first.  She desires testing to make sure everything is ok as she is going to not be able to use her dominant right arm for several weeks.  GYNECOLOGIC HISTORY: Patient's last menstrual period was 10/18/2001. Contraception: hysterectomy Menopausal hormone therapy: estradiol patch and vag cream.   Patient Active Problem List   Diagnosis Date Noted  . Hearing loss of both ears 12/25/2014  . Hyperlipidemia   . Hypertension   . Prediabetes   . Vitamin D deficiency   . GERD (gastroesophageal reflux disease) 11/17/2011  . Interstitial cystitis     Past Medical History:  Diagnosis Date  . Anxiety   . Arthritis    in neck  . Bursitis of right hip   . Carpal tunnel syndrome, bilateral    rt wrist  . Complete rotator cuff tear of left shoulder   . Eagle's syndrome   . GERD (gastroesophageal reflux disease)   . Heart murmur    as child  . HNP (herniated nucleus pulposus)   . Interstitial cystitis   . Kidney cysts   . Osteopenia   . Plantar fasciitis   . Spinal stenosis   . Tingling    rt foot  . Wears hearing aid     Past Surgical History:  Procedure Laterality Date  . ABDOMINAL HYSTERECTOMY  2003   BSO  . ANAL SPHINCTEROTOMY    . CARPAL TUNNEL RELEASE     LEFT  . CESAREAN SECTION    . CYSTOSCOPY    . [place]   . LUMBAR LAMINECTOMY    . LUMBAR LAMINECTOMY/DECOMPRESSION MICRODISCECTOMY N/A 08/09/2014   Procedure: MICRO LUMBAR DECOMPRESSION L3-4,  REDO DECOMPRESSION, MICRODISCECTOMY L4-5, FORAMINOTOMY L3-L4, L4-L5 BILATERAL;  Surgeon: Susa Day, MD;  Location: [facility];  Service: Orthopedics;  Laterality: N/A;  . MYRINGOTOMY    . NASAL SINUS SURGERY    . TONSILLECTOMY     LEFT only, due to Eagle Syndrome  . TONSILLECTOMY  10/14   right side due to Eagles syndrome  . TRIGGER FINGER RELEASE     RIGHT  . TYMPANOSTOMY TUBE PLACEMENT      MEDS:   Current Outpatient Medications on File Prior to Visit  Medication Sig Dispense Refill  . Ascorbic Acid (VITAMIN C) 100 MG tablet Take 100 mg by mouth daily.    . Biotin 1 MG CAPS Take 1 capsule by mouth daily.     . calcium carbonate (OS-CAL - DOSED IN MG OF ELEMENTAL CALCIUM) 1250 (500 Ca) MG tablet Take 1 tablet by mouth.    . cholecalciferol (VITAMIN D) 1000 UNITS tablet Take 3,000 Units by mouth daily.     . cyclobenzaprine (FLEXERIL) 5 MG tablet Take 1 tablet (5 mg total) by mouth 3 (three) times daily as needed for  muscle spasms. 60 tablet 0  . estradiol (ESTRACE VAGINAL) 0.1 MG/GM vaginal cream 1 gram pv twice weekly. 42.5 g 1  . estradiol (VIVELLE-DOT) 0.0375 MG/24HR Place 1 patch onto the skin 2 (two) times a week. 24 patch 4  . Flaxseed, Linseed, (FLAX SEED OIL) 1000 MG CAPS Take by mouth.    . mirabegron ER (MYRBETRIQ) 25 MG TB24 tablet Take 25 mg by mouth daily.    Marland Kitchen omeprazole (PRILOSEC) 20 MG capsule TAKE 1 CAPSULE (20 MG TOTAL) BY MOUTH 2 (TWO) TIMES DAILY BEFORE A MEAL. 180 capsule 3  . polyethylene glycol (MIRALAX) packet Take 17 g by mouth daily as needed.    . Probiotic Product (PROBIOTIC PO) Take by mouth daily.    Marland Kitchen pyridOXINE (VITAMIN B-6) 100 MG tablet Take 100 mg by mouth daily.    . vitamin B-12 (CYANOCOBALAMIN) 100 MCG tablet Take 100 mcg by mouth daily.     No current facility-administered medications on file prior to visit.      ALLERGIES: Other; Codeine; and Sulfa antibiotics  Family History  Problem Relation Age of Onset  . Diabetes Mother   . Hypertension Mother   . Hyperlipidemia Mother   . Osteoporosis Mother   . Hypertension Brother   . Hyperlipidemia Brother   . Prostate cancer Father        Bone     SH:  Married, non smoker  Review of Systems  All other systems reviewed and are negative.   PHYSICAL EXAMINATION:    BP 122/78 (BP Location: Left Arm, Patient Position: Sitting, Cuff Size: Normal)   Pulse 76   Resp 14   Ht 4' 10.5" (1.486 m)   Wt 123 lb 6.4 oz (56 kg)   [name] 10/18/2001   BMI 25.35 kg/m     General appearance: alert, cooperative and appears stated age Lymph:  No inguinal LAD  Pelvic: External genitalia:  no lesions              Urethra:  normal appearing urethra with no masses, tenderness or lesions              Bartholins and Skenes: normal                 Vagina: normal appearing vagina with normal color and discharge, no lesions              Cervix: absent              Bimanual Exam:  Uterus:  uterus absent              Adnexa: no mass, fullness, tenderness              Anus: no lesions  Chaperone was present for exam.  Assessment: Vaginal odor yesterday that is not present today Mild vulvar irritation that has significantly improved with vaginal estrogen use  Plan: D/w pt strategies for using left hand for estrogen application after surgery has been done Nuswab BV testing obtained today.  Will await results before proceeding with any treatment.   ~15 minutes spent with patient >50% of time was in face to face discussion of above.

## 2017-09-01 LAB — BACTERIAL VAGINOSIS, NAA

## 2017-09-04 DIAGNOSIS — H7203 Central perforation of tympanic membrane, bilateral: Secondary | ICD-10-CM | POA: Diagnosis not present

## 2017-09-04 DIAGNOSIS — H6121 Impacted cerumen, right ear: Secondary | ICD-10-CM | POA: Diagnosis not present

## 2017-09-19 ENCOUNTER — Encounter: Payer: Self-pay | Admitting: Adult Health

## 2017-09-20 ENCOUNTER — Encounter: Payer: Self-pay | Admitting: Adult Health

## 2017-09-22 ENCOUNTER — Other Ambulatory Visit: Payer: Self-pay

## 2017-09-22 ENCOUNTER — Emergency Department (HOSPITAL_COMMUNITY)
Admission: EM | Admit: 2017-09-22 | Discharge: 2017-09-22 | Discharge status: Against Medical Advice | Payer: PPO | Attending: Emergency Medicine | Admitting: Emergency Medicine

## 2017-09-22 ENCOUNTER — Encounter (HOSPITAL_COMMUNITY): Payer: Self-pay

## 2017-09-22 DIAGNOSIS — Z5321 Procedure and treatment not carried out due to patient leaving prior to being seen by health care provider: Secondary | ICD-10-CM | POA: Diagnosis not present

## 2017-09-22 DIAGNOSIS — R1012 Left upper quadrant pain: Secondary | ICD-10-CM | POA: Diagnosis present

## 2017-09-22 NOTE — ED Notes (Signed)
Called pt from lobby x1 No response 

## 2017-09-22 NOTE — ED Triage Notes (Signed)
Pt presents to ED from home for LUQ pain. Pt reports that the pain started around 9 pm, and got worse through the night. Denies N/V/D.

## 2017-09-22 NOTE — ED Notes (Signed)
Pt not in lobby.  

## 2017-10-05 DIAGNOSIS — M545 Low back pain: Secondary | ICD-10-CM | POA: Diagnosis not present

## 2017-10-05 DIAGNOSIS — M5136 Other intervertebral disc degeneration, lumbar region: Secondary | ICD-10-CM | POA: Diagnosis not present

## 2017-10-05 DIAGNOSIS — M5134 Other intervertebral disc degeneration, thoracic region: Secondary | ICD-10-CM | POA: Diagnosis not present

## 2017-10-05 DIAGNOSIS — M419 Scoliosis, unspecified: Secondary | ICD-10-CM | POA: Diagnosis not present

## 2017-10-10 DIAGNOSIS — M546 Pain in thoracic spine: Secondary | ICD-10-CM | POA: Diagnosis not present

## 2017-10-13 DIAGNOSIS — M542 Cervicalgia: Secondary | ICD-10-CM | POA: Diagnosis not present

## 2017-11-10 DIAGNOSIS — M47812 Spondylosis without myelopathy or radiculopathy, cervical region: Secondary | ICD-10-CM | POA: Diagnosis not present

## 2017-11-10 DIAGNOSIS — M546 Pain in thoracic spine: Secondary | ICD-10-CM | POA: Diagnosis not present

## 2017-11-10 DIAGNOSIS — M542 Cervicalgia: Secondary | ICD-10-CM | POA: Diagnosis not present

## 2017-11-10 DIAGNOSIS — M4802 Spinal stenosis, cervical region: Secondary | ICD-10-CM | POA: Diagnosis not present

## 2017-11-24 DIAGNOSIS — N3281 Overactive bladder: Secondary | ICD-10-CM | POA: Diagnosis not present

## 2017-11-24 DIAGNOSIS — D3001 Benign neoplasm of right kidney: Secondary | ICD-10-CM | POA: Diagnosis not present

## 2017-12-01 DIAGNOSIS — R030 Elevated blood-pressure reading, without diagnosis of hypertension: Secondary | ICD-10-CM | POA: Diagnosis not present

## 2017-12-01 DIAGNOSIS — M452 Ankylosing spondylitis of cervical region: Secondary | ICD-10-CM | POA: Diagnosis not present

## 2017-12-01 DIAGNOSIS — M532X1 Spinal instabilities, occipito-atlanto-axial region: Secondary | ICD-10-CM | POA: Diagnosis not present

## 2017-12-07 ENCOUNTER — Other Ambulatory Visit: Payer: Self-pay | Admitting: Neurological Surgery

## 2017-12-07 DIAGNOSIS — M25562 Pain in left knee: Secondary | ICD-10-CM | POA: Diagnosis not present

## 2017-12-07 DIAGNOSIS — M545 Low back pain: Secondary | ICD-10-CM | POA: Diagnosis not present

## 2017-12-07 DIAGNOSIS — M532X1 Spinal instabilities, occipito-atlanto-axial region: Secondary | ICD-10-CM

## 2017-12-07 DIAGNOSIS — M5136 Other intervertebral disc degeneration, lumbar region: Secondary | ICD-10-CM | POA: Diagnosis not present

## 2017-12-09 ENCOUNTER — Other Ambulatory Visit: Payer: Medicare Other

## 2017-12-09 ENCOUNTER — Ambulatory Visit
Admission: RE | Admit: 2017-12-09 | Discharge: 2017-12-09 | Disposition: A | Discharge status: Home / Self Care | Payer: Medicare Other | Source: Ambulatory Visit | Attending: Neurological Surgery | Admitting: Neurological Surgery

## 2017-12-09 DIAGNOSIS — M4802 Spinal stenosis, cervical region: Secondary | ICD-10-CM | POA: Diagnosis not present

## 2017-12-09 DIAGNOSIS — M532X1 Spinal instabilities, occipito-atlanto-axial region: Secondary | ICD-10-CM

## 2017-12-18 DIAGNOSIS — M545 Low back pain: Secondary | ICD-10-CM | POA: Diagnosis not present

## 2017-12-18 DIAGNOSIS — M419 Scoliosis, unspecified: Secondary | ICD-10-CM | POA: Diagnosis not present

## 2017-12-18 DIAGNOSIS — M5136 Other intervertebral disc degeneration, lumbar region: Secondary | ICD-10-CM | POA: Diagnosis not present

## 2017-12-18 DIAGNOSIS — M519 Unspecified thoracic, thoracolumbar and lumbosacral intervertebral disc disorder: Secondary | ICD-10-CM | POA: Diagnosis not present

## 2018-01-01 NOTE — Progress Notes (Signed)
MEDICARE ANNUAL WELLNESS VISIT  Assessment:   Essential hypertension - continue medications, DASH diet, exercise and monitor at home. Call if greater than 130/80. -     CBC with Differential/Platelet -     BASIC METABOLIC PANEL WITH GFR -     Hepatic function panel -     TSH  Hyperlipidemia, unspecified hyperlipidemia type -continue medications, check lipids, decrease fatty foods, increase activity.  -     Lipid panel  Prediabetes Monitor  Bilateral hearing loss, unspecified hearing loss type Hearing aids  Gastroesophageal reflux disease, esophagitis presence not specified Continue PPI/H2 blocker, diet discussed -     omeprazole (PRILOSEC) 20 MG capsule; Take 1 capsule (20 mg total) 2 (two) times daily before a meal by mouth.  Interstitial cystitis Monitor, diet discussed  Vitamin D deficiency Continue supplement  Encounter for Medicare annual wellness exam 1 year  Medication management -     Magnesium  BMI 24.0-24.9, adult    Over 40 minutes of exam, counseling, chart review and critical decision making was performed Future Appointments  Date Time Provider [place]  03/30/2018  1:30 PM Megan Salon, MD [place] None     Plan:   During the course of the visit the patient was educated and counseled about appropriate screening and preventive services including:    Pneumococcal vaccine   Prevnar 13  Influenza vaccine  Td vaccine  Screening electrocardiogram  Bone densitometry screening  Colorectal cancer screening  Diabetes screening  Glaucoma screening  Nutrition counseling   Advanced directives: requested   Subjective:  Helen Glover is a 67 y.o. female who presents for Medicare Annual Wellness Visit and complete physical.    She has neck/right shoulder pain, had CT neck and is following with Dr. Matthew Saras in [place]. Still has shoulder pain. Mom is Helen Glover and getting ready to move her, father in law passed.   Her blood  pressure has been controlled at home, today their BP is BP: 126/70 She does workout, not as much due to right shoulder pain, following ortho. She denies chest pain, shortness of breath, dizziness.  She is not on cholesterol medication and denies myalgias. Her cholesterol is at goal. The cholesterol last visit was:   Lab Results  Component Value Date   CHOL 223 (H) 12/25/2016   HDL 106 12/25/2016   LDLCALC 102 (H) 12/25/2016   TRIG 63 12/25/2016   CHOLHDL 2.1 12/25/2016    Last A1C in the office was:  Lab Results  Component Value Date   HGBA1C 5.3 09/09/2016   Last GFR: Lab Results  Component Value Date   GFRNONAA 96 12/25/2016   Patient is on Vitamin D supplement.   Lab Results  Component Value Date   VD25OH 69 12/25/2015     BMI is Body mass index is 24.8 kg/m., she is working on diet and exercise. Wt Readings from Last 3 Encounters:  01/04/18 122 lb 12.8 oz (55.7 kg)  09/22/17 122 lb (55.3 kg)  08/31/17 123 lb 6.4 oz (56 kg)    Medication Review: Current Outpatient Medications on File Prior to Visit  Medication Sig Dispense Refill  . Ascorbic Acid (VITAMIN C) 100 MG tablet Take 100 mg by mouth daily.    . Biotin 1 MG CAPS Take 1 capsule by mouth daily.     . calcium carbonate (OS-CAL - DOSED IN MG OF ELEMENTAL CALCIUM) 1250 (500 Ca) MG tablet Take 1 tablet by mouth.    . cholecalciferol (VITAMIN  D) 1000 UNITS tablet Take 3,000 Units by mouth daily.     Marland Kitchen estradiol (VIVELLE-DOT) 0.0375 MG/24HR Place 1 patch onto the skin 2 (two) times a week. 24 patch 4  . Flaxseed, Linseed, (FLAX SEED OIL) 1000 MG CAPS Take by mouth.    . mirabegron ER (MYRBETRIQ) 25 MG TB24 tablet Take 25 mg by mouth daily.    Marland Kitchen omeprazole (PRILOSEC) 20 MG capsule TAKE 1 CAPSULE (20 MG TOTAL) BY MOUTH 2 (TWO) TIMES DAILY BEFORE A MEAL. 180 capsule 3  . polyethylene glycol (MIRALAX) packet Take 17 g by mouth daily as needed.    . Probiotic Product (PROBIOTIC PO) Take by mouth daily.    Marland Kitchen pyridOXINE  (VITAMIN B-6) 100 MG tablet Take 100 mg by mouth daily.    . vitamin B-12 (CYANOCOBALAMIN) 100 MCG tablet Take 100 mcg by mouth daily.     No current facility-administered medications on file prior to visit.     Allergies  Allergen Reactions  . Other     Band-aid  . Codeine Hives  . Sulfa Antibiotics Nausea And Vomiting    Current Problems (verified) Patient Active Problem List   Diagnosis Date Noted  . Adhesive capsulitis of right shoulder 08/18/2017  . Hearing loss of both ears 12/25/2014  . Hyperlipidemia   . Hypertension   . Prediabetes   . GERD (gastroesophageal reflux disease) 11/17/2011  . Interstitial cystitis     Screening Tests Immunization History  Administered Date(s) Administered  . DTaP 10/04/2010  . Influenza,inj,quad, With Preservative 11/17/2016  . Influenza-Unspecified 11/29/2012, 11/29/2013, 11/17/2014, 11/27/2015, 11/24/2016  . Pneumococcal Conjugate-13 12/21/2013  . Pneumococcal Polysaccharide-23 12/25/2016  . Pneumococcal-Unspecified 12/04/2002  . Zoster 12/03/2005  . Zoster Recombinat (Shingrix) 05/22/2016   Preventative care: Last colonoscopy: 04/27/2013 Dr. Amedeo Plenty Last mammogram: 01/2017 due this year, has OV Last pap smear/pelvic exam: 01/2016 Dr. Sabra Heck DEXA: 01/2017 Dr. Sabra Heck osteopenia AB [place] 10/2016 CT cervical 2019 CT lumbar 2016  Prior vaccinations: TD or Tdap: 2012  Influenza: 2019 August  Pneumococcal: 12/25/2016 Prevnar13: 2015 Shingles/Zostavax: 2018 Shingrex 2018  Names of Other Physician/Practitioners you currently use: 1. [facility] here for primary care 2. Donato Heinz, eye doctor, last visit Spring 2019, wears glasses 3. Dr. Toy Cookey, dentist, last visit Dr. Toy Cookey 10/2017  Patient Care Team: Unk Pinto, MD as PCP - General (Internal Medicine) Barbaraann Cao, OD as Referring Physician (Optometry) Teena Irani, MD (Inactive) as Consulting Physician  (Gastroenterology) Marlou Sa Tonna Corner, MD as Consulting Physician (Orthopedic Surgery) Berenice Primas, MD as Referring Physician (Orthopedic Surgery) Megan Salon, MD as Consulting Physician (Gynecology) Ardis Hughs, MD as Attending Physician (Urology) Leta Baptist, MD as Consulting Physician (Otolaryngology)  SURGICAL HISTORY She  has a past surgical history that includes Cesarean section; Myringotomy; Lumbar laminectomy; Carpal tunnel release; Trigger finger release; Anal sphincterotomy; Nasal sinus surgery; Abdominal hysterectomy (2003); Hemorrhoid surgery (N/A, 1998); Cystoscopy; Tympanostomy tube placement; Tonsillectomy; Tonsillectomy (10/14); and Lumbar laminectomy/decompression microdiscectomy (N/A, 08/09/2014).   FAMILY HISTORY Her family history includes Diabetes in her mother; Hyperlipidemia in her brother and mother; Hypertension in her brother and mother; Osteoporosis in her mother; Prostate cancer in her father.   SOCIAL HISTORY She  reports that she quit smoking about 31 years ago. She has a 7.50 pack-year smoking history. She has never used smokeless tobacco. She reports that she drinks about 6.0 - 7.0 standard drinks of alcohol per week. She reports that she does not use drugs.  MEDICARE WELLNESS OBJECTIVES:  Physical activity: Current Exercise Habits: Home exercise routine, Type of exercise: walking;strength training/weights, Time (Minutes): 30, Frequency (Times/Week): 4, Weekly Exercise (Minutes/Week): 120, Intensity: Mild Cardiac risk factors: Cardiac Risk Factors include: advanced age (>48men, >83 women);dyslipidemia;hypertension Depression/mood screen:   Depression screen [facility] 2/9 01/04/2018  Decreased Interest 0  Down, Depressed, Hopeless 0  PHQ - 2 Score 0    ADLs:  In your present state of health, do you have any difficulty performing the following activities: 01/04/2018  Hearing? N  Vision? N  Difficulty concentrating or making decisions? N  Walking or  climbing stairs? N  Dressing or bathing? N  Doing errands, shopping? N  Some recent data might be hidden     Cognitive Testing  Alert? Yes  Normal Appearance?Yes  Oriented to person? Yes  Place? Yes   Time? Yes  Recall of three objects?  Yes  Can perform simple calculations? Yes  Displays appropriate judgment?Yes  Can read the correct time from a watch face?Yes  EOL planning: Does Patient Have a Medical Advance Directive?: Yes Type of Advance Directive: Healthcare Power of Attorney, Living will Copy of [place] in Chart?: No - copy requested  Review of Systems  Constitutional: Negative.   HENT: Negative.   Eyes: Negative.   Respiratory: Negative.   Cardiovascular: Negative.   Gastrointestinal: Negative.   Genitourinary: Negative.   Musculoskeletal: Positive for joint pain. Negative for back pain, falls, myalgias and neck pain.  Skin: Negative.      Objective:     Today's Vitals   01/04/18 0957  BP: 126/70  Pulse: 66  Resp: 14  Temp: 98.7 F (37.1 C)  SpO2: 97%  Weight: 122 lb 12.8 oz (55.7 kg)  Height: 4\' 11"  (1.499 m)   Body mass index is 24.8 kg/m.  General appearance: alert, no distress, WD/WN, female HEENT: normocephalic, sclerae anicteric, right TM with tube, left TM with perforation, nares patent, no discharge or erythema, pharynx normal Oral cavity: MMM, no lesions Neck: supple, no lymphadenopathy, no thyromegaly, no masses Heart: RRR, normal S1, S2, no murmurs Lungs: CTA bilaterally, no wheezes, rhonchi, or rales Abdomen: +bs, soft, non tender, non distended, no masses, no hepatomegaly, no splenomegaly Musculoskeletal: nontender, no swelling, no obvious deformity Extremities: no edema, no cyanosis, no clubbing Pulses: 2+ symmetric, upper and lower extremities, normal cap refill Neurological: alert, oriented x 3, CN2-12 intact, strength normal upper extremities and lower extremities, sensation normal throughout, DTRs 2+ throughout,  no cerebellar signs, gait normal Psychiatric: normal affect, behavior normal, pleasant    Medicare Attestation I have personally reviewed: The patient's medical and social history Their use of alcohol, tobacco or illicit drugs Their current medications and supplements The patient's functional ability including ADLs,fall risks, home safety risks, cognitive, and hearing and visual impairment Diet and physical activities Evidence for depression or mood disorders  The patient's weight, height, BMI, and visual acuity have been recorded in the chart.  I have made referrals, counseling, and provided education to the patient based on review of the above and I have provided the patient with a written personalized care plan for preventive services.     Vicie Mutters, PA-C   01/04/2018

## 2018-01-04 ENCOUNTER — Encounter: Payer: Self-pay | Admitting: Physician Assistant

## 2018-01-04 ENCOUNTER — Ambulatory Visit (INDEPENDENT_AMBULATORY_CARE_PROVIDER_SITE_OTHER): Payer: PPO | Admitting: Physician Assistant

## 2018-01-04 VITALS — BP 126/70 | HR 66 | Temp 98.7°F | Resp 14 | Ht 59.0 in | Wt 122.8 lb

## 2018-01-04 DIAGNOSIS — Z79899 Other long term (current) drug therapy: Secondary | ICD-10-CM | POA: Diagnosis not present

## 2018-01-04 DIAGNOSIS — Z6824 Body mass index (BMI) 24.0-24.9, adult: Secondary | ICD-10-CM | POA: Diagnosis not present

## 2018-01-04 DIAGNOSIS — N3010 Interstitial cystitis (chronic) without hematuria: Secondary | ICD-10-CM

## 2018-01-04 DIAGNOSIS — E559 Vitamin D deficiency, unspecified: Secondary | ICD-10-CM | POA: Diagnosis not present

## 2018-01-04 DIAGNOSIS — K219 Gastro-esophageal reflux disease without esophagitis: Secondary | ICD-10-CM

## 2018-01-04 DIAGNOSIS — H9193 Unspecified hearing loss, bilateral: Secondary | ICD-10-CM

## 2018-01-04 DIAGNOSIS — I10 Essential (primary) hypertension: Secondary | ICD-10-CM | POA: Diagnosis not present

## 2018-01-04 DIAGNOSIS — Z0001 Encounter for general adult medical examination with abnormal findings: Secondary | ICD-10-CM

## 2018-01-04 DIAGNOSIS — E785 Hyperlipidemia, unspecified: Secondary | ICD-10-CM

## 2018-01-04 DIAGNOSIS — R6889 Other general symptoms and signs: Secondary | ICD-10-CM

## 2018-01-04 DIAGNOSIS — M7501 Adhesive capsulitis of right shoulder: Secondary | ICD-10-CM

## 2018-01-04 DIAGNOSIS — Z0000 Encounter for general adult medical examination without abnormal findings: Secondary | ICD-10-CM

## 2018-01-04 DIAGNOSIS — R7303 Prediabetes: Secondary | ICD-10-CM | POA: Diagnosis not present

## 2018-01-04 NOTE — Patient Instructions (Addendum)
  Ms. Emert , Thank you for taking time to come for your Medicare Wellness Visit. I appreciate your ongoing commitment to your health goals. Please review the following plan we discussed and let me know if I can assist you in the future.    This is a list of the screening recommended for you and due dates:  Health Maintenance  Topic Date Due  . Mammogram  01/30/2019  . Tetanus Vaccine  09/23/2020  . Colon Cancer Screening  04/28/2023  . Flu Shot  Completed  . DEXA scan (bone density measurement)  Completed  .  Hepatitis C: One time screening is recommended by Center for Disease Control  (CDC) for  adults born from [birth year] through 1964.   Completed  . Pneumonia vaccines  Completed    If you have been in the ER, hospital, or long term care facility, we want to see you within one week of discharge so please CONTACT OUR OFFICE at [phone].  We will try to contact you if we know about your admission/visit but we would love for you to contact us first.  We know your history and want to make sure all of your questions are answered and needs are taken care of after admission.   We also want our patients to know that we do NOT approve of [facility] soliciting our patients to do home visits and we do NOT approve of [facility] screenings. Please contact us before doing either of these "services".

## 2018-01-05 LAB — TSH: TSH: 1.56 mIU/L (ref 0.40–4.50)

## 2018-01-05 LAB — HEMOGLOBIN A1C
Hgb A1c MFr Bld: 5.4 % of total Hgb (ref <5.7)
Mean Plasma Glucose: 108 (calc)
eAG (mmol/L): 6 (calc)

## 2018-01-05 LAB — COMPLETE METABOLIC PANEL WITH GFR
AG Ratio: 1.8 (calc) (ref 1.0–2.5)
ALT: 14 U/L (ref 6–29)
AST: 19 U/L (ref 10–35)
Albumin: 4.1 g/dL (ref 3.6–5.1)
Alkaline phosphatase (APISO): 52 U/L (ref 33–130)
BUN: 18 mg/dL (ref 7–25)
CO2: 28 mmol/L (ref 20–32)
Calcium: 9.2 mg/dL (ref 8.6–10.4)
Chloride: 104 mmol/L (ref 98–110)
Creat: 0.78 mg/dL (ref 0.50–0.99)
GFR, Est African American: 92 mL/min/{1.73_m2} (ref >=60)
GFR, Est Non African American: 79 mL/min/{1.73_m2} (ref >=60)
Globulin: 2.3 g/dL (calc) (ref 1.9–3.7)
Glucose, Bld: 89 mg/dL (ref 65–99)
Potassium: 4.2 mmol/L (ref 3.5–5.3)
Sodium: 140 mmol/L (ref 135–146)
Total Bilirubin: 0.4 mg/dL (ref 0.2–1.2)
Total Protein: 6.4 g/dL (ref 6.1–8.1)

## 2018-01-05 LAB — LIPID PANEL
Cholesterol: 224 mg/dL — ABNORMAL HIGH (ref <200)
HDL: 97 mg/dL (ref >50)
LDL Cholesterol (Calc): 109 mg/dL (calc) — ABNORMAL HIGH
Non-HDL Cholesterol (Calc): 127 mg/dL (calc) (ref <130)
Total CHOL/HDL Ratio: 2.3 (calc) (ref <5.0)
Triglycerides: 89 mg/dL (ref <150)

## 2018-01-05 LAB — CBC WITH DIFFERENTIAL/PLATELET
Basophils Absolute: 38 cells/uL (ref 0–200)
Basophils Relative: 0.4 %
Eosinophils Absolute: 190 cells/uL (ref 15–500)
Eosinophils Relative: 2 %
HCT: 38.1 % (ref 35.0–45.0)
Hemoglobin: 13 g/dL (ref 11.7–15.5)
Lymphs Abs: 1501 cells/uL (ref 850–3900)
MCH: 32.7 pg (ref 27.0–33.0)
MCHC: 34.1 g/dL (ref 32.0–36.0)
MCV: 96 fL (ref 80.0–100.0)
MPV: 12.2 fL (ref 7.5–12.5)
Monocytes Relative: 7.3 %
Neutro Abs: 7078 cells/uL (ref 1500–7800)
Neutrophils Relative %: 74.5 %
Platelets: 229 10*3/uL (ref 140–400)
RBC: 3.97 10*6/uL (ref 3.80–5.10)
RDW: 11.8 % (ref 11.0–15.0)
Total Lymphocyte: 15.8 %
WBC mixed population: 694 cells/uL (ref 200–950)
WBC: 9.5 10*3/uL (ref 3.8–10.8)

## 2018-01-05 LAB — MAGNESIUM: Magnesium: 1.9 mg/dL (ref 1.5–2.5)

## 2018-01-07 DIAGNOSIS — M5136 Other intervertebral disc degeneration, lumbar region: Secondary | ICD-10-CM | POA: Diagnosis not present

## 2018-01-17 DIAGNOSIS — M25571 Pain in right ankle and joints of right foot: Secondary | ICD-10-CM | POA: Diagnosis not present

## 2018-01-19 DIAGNOSIS — H7203 Central perforation of tympanic membrane, bilateral: Secondary | ICD-10-CM | POA: Diagnosis not present

## 2018-01-19 DIAGNOSIS — H903 Sensorineural hearing loss, bilateral: Secondary | ICD-10-CM | POA: Diagnosis not present

## 2018-01-19 DIAGNOSIS — H6983 Other specified disorders of Eustachian tube, bilateral: Secondary | ICD-10-CM | POA: Diagnosis not present

## 2018-01-20 DIAGNOSIS — M532X1 Spinal instabilities, occipito-atlanto-axial region: Secondary | ICD-10-CM | POA: Diagnosis not present

## 2018-01-20 DIAGNOSIS — M4802 Spinal stenosis, cervical region: Secondary | ICD-10-CM | POA: Diagnosis not present

## 2018-01-20 DIAGNOSIS — M452 Ankylosing spondylitis of cervical region: Secondary | ICD-10-CM | POA: Diagnosis not present

## 2018-01-25 DIAGNOSIS — M25571 Pain in right ankle and joints of right foot: Secondary | ICD-10-CM | POA: Diagnosis not present

## 2018-01-28 ENCOUNTER — Other Ambulatory Visit: Payer: Self-pay | Admitting: Obstetrics & Gynecology

## 2018-01-29 NOTE — Telephone Encounter (Signed)
Medication refill request: Estradiol Last AEX:  01/16/17 SM Next AEX: 04/23/18  Last MMG (if hormonal medication request): 01/29/17 BIRADS 1 negative/density c Refill authorized: 01/19/17 #24 w/4 refills; today please advise

## 2018-02-01 DIAGNOSIS — Z1231 Encounter for screening mammogram for malignant neoplasm of breast: Secondary | ICD-10-CM | POA: Diagnosis not present

## 2018-02-01 LAB — HM MAMMOGRAPHY

## 2018-02-04 ENCOUNTER — Encounter: Payer: Self-pay | Admitting: *Deleted

## 2018-02-08 ENCOUNTER — Telehealth: Payer: Self-pay | Admitting: Obstetrics & Gynecology

## 2018-02-08 NOTE — Telephone Encounter (Signed)
Patient called requesting an appointment today for BV symptoms.

## 2018-02-08 NOTE — Telephone Encounter (Signed)
Spoke with patient. Patient reports vaginal discomfort, burning, vaginal odor and "increased moisture" for the last month, worsening over the past 3-4 days. Has been applying Aquaphor externally, "does not feel like yeast". Denies vaginal  bleeding. Patient requesting OV, is leaving out of town tomorrow, will return 02/17/18. Is scheduled for "neck surgery" 1/9 in [place].   OV scheduled for 1/2 at 11:15am with Dr. Sabra Heck. Advised I will review with Dr. Sabra Heck and return call if any additional recommendations.   Routing to Dr. Sabra Heck.

## 2018-02-16 DIAGNOSIS — Z79899 Other long term (current) drug therapy: Secondary | ICD-10-CM | POA: Diagnosis not present

## 2018-02-16 DIAGNOSIS — K219 Gastro-esophageal reflux disease without esophagitis: Secondary | ICD-10-CM | POA: Diagnosis not present

## 2018-02-16 DIAGNOSIS — M21371 Foot drop, right foot: Secondary | ICD-10-CM | POA: Diagnosis not present

## 2018-02-16 DIAGNOSIS — M532X2 Spinal instabilities, cervical region: Secondary | ICD-10-CM | POA: Diagnosis not present

## 2018-02-16 DIAGNOSIS — M4802 Spinal stenosis, cervical region: Secondary | ICD-10-CM | POA: Diagnosis not present

## 2018-02-16 DIAGNOSIS — M488X2 Other specified spondylopathies, cervical region: Secondary | ICD-10-CM | POA: Diagnosis not present

## 2018-02-16 DIAGNOSIS — H9193 Unspecified hearing loss, bilateral: Secondary | ICD-10-CM | POA: Diagnosis not present

## 2018-02-16 DIAGNOSIS — M1990 Unspecified osteoarthritis, unspecified site: Secondary | ICD-10-CM | POA: Diagnosis not present

## 2018-02-16 DIAGNOSIS — N3010 Interstitial cystitis (chronic) without hematuria: Secondary | ICD-10-CM | POA: Diagnosis not present

## 2018-02-18 ENCOUNTER — Ambulatory Visit (INDEPENDENT_AMBULATORY_CARE_PROVIDER_SITE_OTHER): Payer: PPO | Admitting: Obstetrics & Gynecology

## 2018-02-18 ENCOUNTER — Encounter: Payer: Self-pay | Admitting: Obstetrics & Gynecology

## 2018-02-18 ENCOUNTER — Other Ambulatory Visit: Payer: Self-pay

## 2018-02-18 VITALS — BP 104/60 | HR 68 | Resp 14 | Ht <= 58 in | Wt 124.4 lb

## 2018-02-18 DIAGNOSIS — N760 Acute vaginitis: Secondary | ICD-10-CM | POA: Diagnosis not present

## 2018-02-18 NOTE — Progress Notes (Signed)
GYNECOLOGY  VISIT  CC:   Possible vaginitis  HPI: 68 y.o. [id number] Married White or Caucasian female here for possible BV. About a week ago, she felt it was worse.  She is feeling some external irritation.  This does seem to come and go.  After she showered this morning, she did feel she had some odor.  She is still using the aquaphor topically.    Patient states that she is having neck surgery next week.  This is going to be C1 and C2.  She is having this done in [place] with a neurosurgeon.  She is going to need to be in the hospital 2-4 days.  Then she will have a brace for 6 weeks with no driving and PT (but this can be done in [place]).    Denies vaginal bleeding.  Has hx of IC.  Bladder function feels "normal" to her.  Taking Myrbetriq every other day.    GYNECOLOGIC HISTORY: Patient's last menstrual period was 10/18/2001. Contraception: post menapausal Menopausal hormone therapy: Yes estradiol   Patient Active Problem List   Diagnosis Date Noted  . Adhesive capsulitis of right shoulder 08/18/2017  . Hearing loss of both ears 12/25/2014  . Hyperlipidemia   . Hypertension   . Prediabetes   . GERD (gastroesophageal reflux disease) 11/17/2011  . Interstitial cystitis     Past Medical History:  Diagnosis Date  . Anxiety   . Arthritis    in neck  . Bursitis of right hip   . Carpal tunnel syndrome, bilateral    rt wrist  . Complete rotator cuff tear of left shoulder   . Eagle's syndrome   . GERD (gastroesophageal reflux disease)   . Heart murmur    as child  . HNP (herniated nucleus pulposus)   . Interstitial cystitis   . Kidney cysts   . Osteopenia   . Plantar fasciitis   . Spinal stenosis   . Tingling    rt foot  . Wears hearing aid     Past Surgical History:  Procedure Laterality Date  . ABDOMINAL HYSTERECTOMY  2003   BSO  . ANAL SPHINCTEROTOMY    . CARPAL TUNNEL RELEASE     LEFT  . CESAREAN SECTION    . CYSTOSCOPY    . [place]  . LUMBAR  LAMINECTOMY    . LUMBAR LAMINECTOMY/DECOMPRESSION MICRODISCECTOMY N/A 08/09/2014   Procedure: MICRO LUMBAR DECOMPRESSION L3-4,  REDO DECOMPRESSION, MICRODISCECTOMY L4-5, FORAMINOTOMY L3-L4, L4-L5 BILATERAL;  Surgeon: Susa Day, MD;  Location: [facility];  Service: Orthopedics;  Laterality: N/A;  . MYRINGOTOMY    . NASAL SINUS SURGERY    . TONSILLECTOMY     LEFT only, due to Eagle Syndrome  . TONSILLECTOMY  10/14   right side due to Eagles syndrome  . TRIGGER FINGER RELEASE     RIGHT  . TYMPANOSTOMY TUBE PLACEMENT      MEDS:   Current Outpatient Medications on File Prior to Visit  Medication Sig Dispense Refill  . Ascorbic Acid (VITAMIN C) 100 MG tablet Take 100 mg by mouth daily.    . Biotin 1 MG CAPS Take 1 capsule by mouth daily.     . calcium carbonate (OS-CAL - DOSED IN MG OF ELEMENTAL CALCIUM) 1250 (500 Ca) MG tablet Take 1 tablet by mouth.    . cholecalciferol (VITAMIN D) 1000 UNITS tablet Take 3,000 Units by mouth daily.     Marland Kitchen estradiol (VIVELLE-DOT) 0.0375 MG/24HR APPLY 1 PATCH ONTO THE  SKIN 2 TIMES WEEKLY 8 patch 4  . Flaxseed, Linseed, (FLAX SEED OIL) 1000 MG CAPS Take by mouth.    . mirabegron ER (MYRBETRIQ) 25 MG TB24 tablet Take 25 mg by mouth daily.    Marland Kitchen omeprazole (PRILOSEC) 20 MG capsule TAKE 1 CAPSULE (20 MG TOTAL) BY MOUTH 2 (TWO) TIMES DAILY BEFORE A MEAL. 180 capsule 3  . polyethylene glycol (MIRALAX) packet Take 17 g by mouth daily as needed.    . Probiotic Product (PROBIOTIC PO) Take by mouth daily.    Marland Kitchen pyridOXINE (VITAMIN B-6) 100 MG tablet Take 100 mg by mouth daily.    . vitamin B-12 (CYANOCOBALAMIN) 100 MCG tablet Take 100 mcg by mouth daily.     No current facility-administered medications on file prior to visit.     ALLERGIES: Other; Codeine; and Sulfa antibiotics  Family History  Problem Relation Age of Onset  . Diabetes Mother   . Hypertension Mother   . Hyperlipidemia Mother   . Osteoporosis Mother   . Hypertension Brother   . Hyperlipidemia  Brother   . Prostate cancer Father        Bone    SH:  Married, non smoker  Review of Systems  Genitourinary: Positive for frequency and urgency.  Musculoskeletal: Positive for neck pain.    PHYSICAL EXAMINATION:    BP 104/60   Pulse 68   Resp 14   Ht 4\' 10"  (1.473 m)   Wt 124 lb 6.4 oz (56.4 kg)   [name] 10/18/2001   BMI 26.00 kg/m     General appearance: alert, cooperative and appears stated age Lymph:  no inguinal LAD noted  Pelvic: External genitalia:  no lesions              Urethra:  normal appearing urethra with no masses, tenderness or lesions              Bartholins and Skenes: normal                 Vagina: normal appearing vagina with normal color and discharge, no lesions              Cervix: absent              Bimanual Exam:  Uterus:  uterus absent              Adnexa: no mass, fullness, tenderness  Chaperone was present for exam.  Assessment: H/o recurrent vaginitis with last several tests being normal Vaginal irritation  Plan: Nuswab BV testing obtained today.  Results will be communicated to pt and treatment done at that time if needed.  Pt comfortable with this plan.

## 2018-02-19 ENCOUNTER — Other Ambulatory Visit: Payer: Self-pay | Admitting: Obstetrics & Gynecology

## 2018-02-19 NOTE — Telephone Encounter (Signed)
Medication refill request: Vivelle- dot  Last AEX:  01-16-17 SM  Next AEX: 04-23-18  Last MMG (if hormonal medication request): 02-01-18 density C/BIRADS 1 negative  Refill authorized: pharmacy requesting 90 day supply- please advise

## 2018-02-20 LAB — BACTERIAL VAGINOSIS, NAA

## 2018-02-25 DIAGNOSIS — R9431 Abnormal electrocardiogram [ECG] [EKG]: Secondary | ICD-10-CM | POA: Diagnosis not present

## 2018-02-25 DIAGNOSIS — M532X1 Spinal instabilities, occipito-atlanto-axial region: Secondary | ICD-10-CM | POA: Diagnosis not present

## 2018-02-25 DIAGNOSIS — K219 Gastro-esophageal reflux disease without esophagitis: Secondary | ICD-10-CM | POA: Diagnosis not present

## 2018-02-25 DIAGNOSIS — I440 Atrioventricular block, first degree: Secondary | ICD-10-CM | POA: Diagnosis not present

## 2018-02-25 DIAGNOSIS — M5382 Other specified dorsopathies, cervical region: Secondary | ICD-10-CM | POA: Diagnosis not present

## 2018-02-25 DIAGNOSIS — R000 Tachycardia, unspecified: Secondary | ICD-10-CM | POA: Diagnosis not present

## 2018-02-25 DIAGNOSIS — M532X2 Spinal instabilities, cervical region: Secondary | ICD-10-CM | POA: Diagnosis not present

## 2018-02-25 DIAGNOSIS — M4801 Spinal stenosis, occipito-atlanto-axial region: Secondary | ICD-10-CM | POA: Diagnosis not present

## 2018-02-25 DIAGNOSIS — Z79899 Other long term (current) drug therapy: Secondary | ICD-10-CM | POA: Diagnosis not present

## 2018-02-25 DIAGNOSIS — M1990 Unspecified osteoarthritis, unspecified site: Secondary | ICD-10-CM | POA: Diagnosis not present

## 2018-02-25 DIAGNOSIS — N3010 Interstitial cystitis (chronic) without hematuria: Secondary | ICD-10-CM | POA: Diagnosis not present

## 2018-02-25 DIAGNOSIS — H9193 Unspecified hearing loss, bilateral: Secondary | ICD-10-CM | POA: Diagnosis not present

## 2018-02-25 DIAGNOSIS — I4510 Unspecified right bundle-branch block: Secondary | ICD-10-CM | POA: Diagnosis not present

## 2018-02-25 DIAGNOSIS — M21371 Foot drop, right foot: Secondary | ICD-10-CM | POA: Diagnosis not present

## 2018-02-25 DIAGNOSIS — M488X2 Other specified spondylopathies, cervical region: Secondary | ICD-10-CM | POA: Diagnosis not present

## 2018-02-25 DIAGNOSIS — M451 Ankylosing spondylitis of occipito-atlanto-axial region: Secondary | ICD-10-CM | POA: Diagnosis not present

## 2018-02-25 DIAGNOSIS — Z4889 Encounter for other specified surgical aftercare: Secondary | ICD-10-CM | POA: Diagnosis not present

## 2018-02-25 DIAGNOSIS — M4802 Spinal stenosis, cervical region: Secondary | ICD-10-CM | POA: Diagnosis not present

## 2018-02-25 DIAGNOSIS — G9520 Unspecified cord compression: Secondary | ICD-10-CM | POA: Diagnosis not present

## 2018-02-25 DIAGNOSIS — Z981 Arthrodesis status: Secondary | ICD-10-CM | POA: Diagnosis not present

## 2018-02-25 HISTORY — PX: CERVICAL FUSION: SHX112

## 2018-03-30 ENCOUNTER — Encounter

## 2018-03-30 ENCOUNTER — Ambulatory Visit: Payer: PPO | Admitting: Obstetrics & Gynecology

## 2018-03-30 DIAGNOSIS — R070 Pain in throat: Secondary | ICD-10-CM | POA: Diagnosis not present

## 2018-03-30 DIAGNOSIS — R1312 Dysphagia, oropharyngeal phase: Secondary | ICD-10-CM | POA: Diagnosis not present

## 2018-04-06 DIAGNOSIS — M532X1 Spinal instabilities, occipito-atlanto-axial region: Secondary | ICD-10-CM | POA: Diagnosis not present

## 2018-04-06 DIAGNOSIS — Z981 Arthrodesis status: Secondary | ICD-10-CM | POA: Diagnosis not present

## 2018-04-12 DIAGNOSIS — M542 Cervicalgia: Secondary | ICD-10-CM | POA: Diagnosis not present

## 2018-04-13 ENCOUNTER — Ambulatory Visit (INDEPENDENT_AMBULATORY_CARE_PROVIDER_SITE_OTHER): Payer: PPO | Admitting: Adult Health Nurse Practitioner

## 2018-04-13 ENCOUNTER — Other Ambulatory Visit: Payer: Self-pay | Admitting: Internal Medicine

## 2018-04-13 ENCOUNTER — Encounter: Payer: Self-pay | Admitting: Adult Health Nurse Practitioner

## 2018-04-13 VITALS — BP 140/76 | HR 66 | Temp 97.9°F | Ht <= 58 in | Wt 115.2 lb

## 2018-04-13 DIAGNOSIS — N3010 Interstitial cystitis (chronic) without hematuria: Secondary | ICD-10-CM | POA: Diagnosis not present

## 2018-04-13 DIAGNOSIS — K219 Gastro-esophageal reflux disease without esophagitis: Secondary | ICD-10-CM

## 2018-04-13 DIAGNOSIS — Z79899 Other long term (current) drug therapy: Secondary | ICD-10-CM

## 2018-04-13 DIAGNOSIS — R1912 Hyperactive bowel sounds: Secondary | ICD-10-CM

## 2018-04-13 DIAGNOSIS — R0982 Postnasal drip: Secondary | ICD-10-CM

## 2018-04-13 DIAGNOSIS — R198 Other specified symptoms and signs involving the digestive system and abdomen: Secondary | ICD-10-CM | POA: Diagnosis not present

## 2018-04-13 DIAGNOSIS — R1011 Right upper quadrant pain: Secondary | ICD-10-CM

## 2018-04-13 NOTE — Progress Notes (Signed)
Assessment and Plan:  Helen Glover was seen today for acute visit.  Discussed likely diarrhea was caused from increased efforts to have bowel movement after surgery in combination with zyrtec.  Diarrhea should continue to decrease.  If not consider C-diff toxin testing.  Will check labs for abnormalities. Diagnoses and all orders for this visit:  Gastroesophageal reflux disease, esophagitis presence not specified Continue PPI with benefit. Monitor diet for triggers -     CBC with Differential/Platelet -     COMPLETE METABOLIC PANEL WITH GFR -     Magnesium  Right upper quadrant abdominal pain Continue to monitor for triggers and frequency.  Notify office with increased duration or frequency. -     CBC with Differential/Platelet -     COMPLETE METABOLIC PANEL WITH GFR  Hyperactive bowel sounds Discussed taking OTC probiotic, ie Digestive advantage Increase dose of this. Provided education for FODMAP diet  Post nasal drip STOP zyrtec Consider Allegra if symptoms persist Try to avoid this if possible  Interstitial cystitis Monitor symptoms Holding Mybertriq for now related to surgery and retention  Medication management -     CBC with Differential/Platelet -     COMPLETE METABOLIC PANEL WITH GFR -     Magnesium      Further disposition pending results of labs. Discussed med's effects and SE's.   Over 30 minutes of exam, counseling, chart review, and critical decision making was performed.   Future Appointments  Date Time Provider [place]  04/13/2018  9:30 AM Garnet Sierras, NP [facility] None  04/23/2018  3:15 PM Megan Salon, MD [place] None  07/23/2018 11:00 AM Unk Pinto, MD [facility] None  01/18/2019 10:00 AM Vicie Mutters, PA-C [facility] None    ------------------------------------------------------------------------------------------------------------------   HPI 68 y.o.female presents for evaluation of diarrhea for the past 4 days and RUQ  intermittent discomfort. Two weeks ago she had a C1-2 laminectomy & fusion, posterior approach.  She is wearing a C-collar brace on for one hour then off an hour.  She has started PT yesterday and doing well.  Reports he pain is well controlled and taking robaxin at night. She has had some nausea immediately after surgery but has since resolved.  She has had one post op appointment since then.   It was recommended that she start Zyrtec one week ago related to increased drainage.  She started experiencing diarrhea so she stopped this one day ago.  Yesterday she had 7 bowel movements and two today. Reports she is able to eat and drink without difficulty although her appetite has been decreased since surgery.  She is able to sleep through the night without waking to use the bathroom.  Right upper quadrant discomfort that is intermittent that last for 1-3 mins. Reports she had this prior to her surgery.  There is not an association with eating or drinking.  She reports that she is having a lot of gas but has dealt with this for years. Prior to surgery she leaned toward constipation.  She was using Miralax to help with this but is not taking this now.  Reports she eats a healthy diet full of fruits and vegetables.     Past Medical History:  Diagnosis Date  . Anxiety   . Arthritis    in neck  . Bursitis of right hip   . Carpal tunnel syndrome, bilateral    rt wrist  . Complete rotator cuff tear of left shoulder   . Eagle's syndrome   . GERD (  gastroesophageal reflux disease)   . Heart murmur    as child  . HNP (herniated nucleus pulposus)   . Interstitial cystitis   . Kidney cysts   . Osteopenia   . Plantar fasciitis   . Spinal stenosis   . Tingling    rt foot  . Wears hearing aid      Allergies  Allergen Reactions  . Other     Band-aid  . Codeine Hives  . Sulfa Antibiotics Nausea And Vomiting    Current Outpatient Medications on File Prior to Visit  Medication Sig  . Ascorbic Acid  (VITAMIN C) 100 MG tablet Take 100 mg by mouth daily.  . Biotin 1 MG CAPS Take 1 capsule by mouth daily.   . calcium carbonate (OS-CAL - DOSED IN MG OF ELEMENTAL CALCIUM) 1250 (500 Ca) MG tablet Take 1 tablet by mouth.  . cholecalciferol (VITAMIN D) 1000 UNITS tablet Take 3,000 Units by mouth daily.   Marland Kitchen estradiol (VIVELLE-DOT) 0.0375 MG/24HR APPLY 1 PATCH TO SKIN TWICE WEEKLY  . Flaxseed, Linseed, (FLAX SEED OIL) 1000 MG CAPS Take by mouth.  . mirabegron ER (MYRBETRIQ) 25 MG TB24 tablet Take 25 mg by mouth daily.  Marland Kitchen omeprazole (PRILOSEC) 20 MG capsule TAKE 1 CAPSULE (20 MG TOTAL) BY MOUTH 2 (TWO) TIMES DAILY BEFORE A MEAL.  Marland Kitchen polyethylene glycol (MIRALAX) packet Take 17 g by mouth daily as needed.  . Probiotic Product (PROBIOTIC PO) Take by mouth daily.  Marland Kitchen pyridOXINE (VITAMIN B-6) 100 MG tablet Take 100 mg by mouth daily.  . vitamin B-12 (CYANOCOBALAMIN) 100 MCG tablet Take 100 mcg by mouth daily.   No current facility-administered medications on file prior to visit.     ROS: Review of Systems  Constitutional: Negative for chills, diaphoresis, fever, malaise/fatigue and weight loss.  HENT: Negative for congestion, ear discharge, ear pain, hearing loss, nosebleeds, sinus pain, sore throat and tinnitus.   Respiratory: Negative for cough, hemoptysis, sputum production, shortness of breath, wheezing and stridor.   Cardiovascular: Negative for chest pain, palpitations, orthopnea, claudication, leg swelling and PND.  Gastrointestinal: Positive for abdominal pain and diarrhea. Negative for blood in stool, constipation, heartburn, melena, nausea and vomiting.  Genitourinary: Negative for dysuria, flank pain, frequency, hematuria and urgency.       Reports interstitial cystitis, asymptomatic today.  Musculoskeletal: Positive for neck pain. Negative for back pain, falls, joint pain and myalgias.  Skin: Negative for itching and rash.  Neurological: Negative for dizziness and headaches.      Physical Exam:  General Appearance: Well nourished, in no apparent distress. Eyes: PERRLA, EOMs, conjunctiva no swelling or erythema Respiratory: Respiratory effort normal, BS equal bilaterally without rales, rhonchi, wheezing or stridor.  Cardio: RRR with no MRGs. Brisk peripheral pulses without edema.  Abdomen: Soft, Non tender, no guarding, rebound, hernias, masses.Hyperactive bowel sounds in all quadrants. Musculoskeletal: Full ROM, 5/5 strength, normal gait. Wearing C-collar. Skin: Warm, dry without rashes, lesions, ecchymosis. Posterior neck incision, well approximated, free of any redness or exudate. Neuro: Cranial nerves intact. Normal muscle tone, no cerebellar symptoms. Sensation intact.  Psych: Awake and oriented X 3, normal affect, Insight and Judgment appropriate.     Garnet Sierras, NP 12:03 AM [facility]

## 2018-04-13 NOTE — Patient Instructions (Addendum)
Double the probiotic, take two tablets   You can also try digestive advantage.    Allergy Symptoms / Runny Nose:   Allegra / fexofenadine Take 180mg  by mouth daily If this is not effective try Zyrtec or Xyzal   Xyzal / Levocetirazine  Take 5mg  by mouth May cause drowsiness, take nightly Be sure to drink plenty of water If this is not effective try Allegra or Zyrtec     Asthma:  Singular / Montelukast Take 10mg  every day  OR   Claritin daily     If you have any new or worsening symptoms please contact our office.  Abdominal pain that does not resolve or any new symptoms.    We will contact you by MyChart with your lab results.

## 2018-04-14 LAB — COMPLETE METABOLIC PANEL WITH GFR
AG Ratio: 2.1 (calc) (ref 1.0–2.5)
ALT: 13 U/L (ref 6–29)
AST: 16 U/L (ref 10–35)
Albumin: 4.6 g/dL (ref 3.6–5.1)
Alkaline phosphatase (APISO): 60 U/L (ref 37–153)
BUN: 14 mg/dL (ref 7–25)
CO2: 27 mmol/L (ref 20–32)
Calcium: 9.9 mg/dL (ref 8.6–10.4)
Chloride: 105 mmol/L (ref 98–110)
Creat: 0.67 mg/dL (ref 0.50–0.99)
GFR, Est African American: 106 mL/min/{1.73_m2} (ref >=60)
GFR, Est Non African American: 92 mL/min/{1.73_m2} (ref >=60)
Globulin: 2.2 g/dL (calc) (ref 1.9–3.7)
Glucose, Bld: 95 mg/dL (ref 65–99)
Potassium: 4.6 mmol/L (ref 3.5–5.3)
Sodium: 141 mmol/L (ref 135–146)
Total Bilirubin: 0.3 mg/dL (ref 0.2–1.2)
Total Protein: 6.8 g/dL (ref 6.1–8.1)

## 2018-04-14 LAB — TEST AUTHORIZATION

## 2018-04-14 LAB — CBC WITH DIFFERENTIAL/PLATELET
Absolute Monocytes: 601 cells/uL (ref 200–950)
Basophils Absolute: 39 cells/uL (ref 0–200)
Basophils Relative: 0.5 %
Eosinophils Absolute: 78 {cells}/uL (ref 15–500)
Eosinophils Relative: 1 %
HCT: 39.5 % (ref 35.0–45.0)
Hemoglobin: 13.4 g/dL (ref 11.7–15.5)
Lymphs Abs: 1880 cells/uL (ref 850–3900)
MCH: 32.5 pg (ref 27.0–33.0)
MCHC: 33.9 g/dL (ref 32.0–36.0)
MCV: 95.9 fL (ref 80.0–100.0)
MPV: 12.3 fL (ref 7.5–12.5)
Monocytes Relative: 7.7 %
Neutro Abs: 5203 cells/uL (ref 1500–7800)
Neutrophils Relative %: 66.7 %
Platelets: 289 10*3/uL (ref 140–400)
RBC: 4.12 10*6/uL (ref 3.80–5.10)
RDW: 11.8 % (ref 11.0–15.0)
Total Lymphocyte: 24.1 %
WBC: 7.8 10*3/uL (ref 3.8–10.8)

## 2018-04-14 LAB — AMYLASE: Amylase: 80 U/L (ref 21–101)

## 2018-04-14 LAB — LIPASE: Lipase: 77 U/L — ABNORMAL HIGH (ref 7–60)

## 2018-04-14 LAB — MAGNESIUM: Magnesium: 2 mg/dL (ref 1.5–2.5)

## 2018-04-14 MED ORDER — CYCLOBENZAPRINE HCL 5 MG PO TABS: 5.0000 mg | ORAL_TABLET | Freq: Three times a day (TID) | ORAL | 0 refills | Status: DC | PRN

## 2018-04-15 DIAGNOSIS — M542 Cervicalgia: Secondary | ICD-10-CM | POA: Diagnosis not present

## 2018-04-16 NOTE — Addendum Note (Signed)
Addended by: Garnet Sierras A on: 04/16/2018 08:58 AM   Modules accepted: Orders

## 2018-04-22 DIAGNOSIS — M542 Cervicalgia: Secondary | ICD-10-CM | POA: Diagnosis not present

## 2018-04-23 ENCOUNTER — Other Ambulatory Visit: Payer: Self-pay

## 2018-04-23 ENCOUNTER — Ambulatory Visit (INDEPENDENT_AMBULATORY_CARE_PROVIDER_SITE_OTHER): Payer: PPO | Admitting: Obstetrics & Gynecology

## 2018-04-23 ENCOUNTER — Encounter: Payer: Self-pay | Admitting: Obstetrics & Gynecology

## 2018-04-23 VITALS — BP 138/68 | HR 62 | Resp 14 | Ht <= 58 in | Wt 115.0 lb

## 2018-04-23 DIAGNOSIS — Z01419 Encounter for gynecological examination (general) (routine) without abnormal findings: Secondary | ICD-10-CM | POA: Diagnosis not present

## 2018-04-23 DIAGNOSIS — R748 Abnormal levels of other serum enzymes: Secondary | ICD-10-CM

## 2018-04-23 DIAGNOSIS — R1011 Right upper quadrant pain: Secondary | ICD-10-CM | POA: Diagnosis not present

## 2018-04-23 MED ORDER — ESTRADIOL 0.025 MG/24HR TD PTTW: 1.0000 | MEDICATED_PATCH | TRANSDERMAL | 4 refills | Status: DC

## 2018-04-23 NOTE — Progress Notes (Signed)
RUQ Abd [place] scheduled for 04/29/2018 at [place] arrival.  NPO after midnight.

## 2018-04-23 NOTE — Progress Notes (Signed)
68 y.o. G72P1102 Married White or Caucasian female here for annual exam.  Had c1-c2 fusion, laminectomy 02/25/2018.  Has done really well since surgery.  Doing PT.  On Tylenol and flexeril right now.  May need to have something done to right shoulder.  Having some epigastric pain and some issues with diarrhea a few weeks ago.  She is having some intermittent RUQ pain.  Was seen by Garnet Sierras on 04/13/2018.    Denies vaginal bleeding.  Lipase was mildly elevated at 77.  Amylase was normal.  Continuing to have intermittent pain.  Patient's last menstrual period was 10/18/2001.          Sexually active: No.  The current method of family planning is status post hysterectomy.    Exercising: No.  The patient does not participate in regular exercise at present. Smoker:  no  Health Maintenance: Pap:  06/27/13 neg  History of abnormal Pap:  yes MMG:  02/01/18 BIRADS1:neg   Colonoscopy:  04/27/13 f/u 10 years  BMD:   01/29/17 Osteopenia  TDaP:  09/2010 Pneumonia vaccine(s):  done Shingrix:   Completed  Hep C testing: 12/25/14 Neg  Screening Labs: PCP   reports that she quit smoking about 31 years ago. She has a 7.50 pack-year smoking history. She has never used smokeless tobacco. She reports current alcohol use of about 6.0 - 7.0 standard drinks of alcohol per week. She reports that she does not use drugs.  Past Medical History:  Diagnosis Date  . Anxiety   . Arthritis    in neck  . Bursitis of right hip   . Carpal tunnel syndrome, bilateral    rt wrist  . Complete rotator cuff tear of left shoulder   . Eagle's syndrome   . GERD (gastroesophageal reflux disease)   . Heart murmur    as child  . HNP (herniated nucleus pulposus)   . Interstitial cystitis   . Kidney cysts   . Osteopenia   . Plantar fasciitis   . Spinal stenosis   . Tingling    rt foot  . Wears hearing aid     Past Surgical History:  Procedure Laterality Date  . ABDOMINAL HYSTERECTOMY  2003   BSO  . ANAL  SPHINCTEROTOMY    . CARPAL TUNNEL RELEASE     LEFT  . CESAREAN SECTION    . CYSTOSCOPY    . [place]  . LUMBAR LAMINECTOMY    . LUMBAR LAMINECTOMY/DECOMPRESSION MICRODISCECTOMY N/A 08/09/2014   Procedure: MICRO LUMBAR DECOMPRESSION L3-4,  REDO DECOMPRESSION, MICRODISCECTOMY L4-5, FORAMINOTOMY L3-L4, L4-L5 BILATERAL;  Surgeon: Susa Day, MD;  Location: [facility];  Service: Orthopedics;  Laterality: N/A;  . MYRINGOTOMY    . NASAL SINUS SURGERY    . TONSILLECTOMY     LEFT only, due to Eagle Syndrome  . TONSILLECTOMY  10/14   right side due to Eagles syndrome  . TRIGGER FINGER RELEASE     RIGHT  . TYMPANOSTOMY TUBE PLACEMENT      Current Outpatient Medications  Medication Sig Dispense Refill  . Ascorbic Acid (VITAMIN C) 100 MG tablet Take 100 mg by mouth daily.    . Biotin 1 MG CAPS Take 1 capsule by mouth daily.     . calcium carbonate (OS-CAL - DOSED IN MG OF ELEMENTAL CALCIUM) 1250 (500 Ca) MG tablet Take 1 tablet by mouth.    . cholecalciferol (VITAMIN D) 1000 UNITS tablet Take 3,000 Units by mouth daily.     Marland Kitchen  cyclobenzaprine (FLEXERIL) 5 MG tablet Take 1 tablet (5 mg total) by mouth 3 (three) times daily as needed for muscle spasms. 60 tablet 0  . estradiol (VIVELLE-DOT) 0.0375 MG/24HR APPLY 1 PATCH TO SKIN TWICE WEEKLY 24 patch 0  . Flaxseed, Linseed, (FLAX SEED OIL) 1000 MG CAPS Take by mouth.    . mirabegron ER (MYRBETRIQ) 25 MG TB24 tablet Take 25 mg by mouth daily.    Marland Kitchen omeprazole (PRILOSEC) 20 MG capsule TAKE 1 CAPSULE (20 MG TOTAL) BY MOUTH 2 (TWO) TIMES DAILY BEFORE A MEAL. 180 capsule 3  . polyethylene glycol (MIRALAX) packet Take 17 g by mouth daily as needed.    . Probiotic Product (PROBIOTIC PO) Take by mouth daily.    Marland Kitchen pyridOXINE (VITAMIN B-6) 100 MG tablet Take 100 mg by mouth daily.    . vitamin B-12 (CYANOCOBALAMIN) 100 MCG tablet Take 100 mcg by mouth daily.     No current facility-administered medications for this visit.     Family  History  Problem Relation Age of Onset  . Diabetes Mother   . Hypertension Mother   . Hyperlipidemia Mother   . Osteoporosis Mother   . Hypertension Brother   . Hyperlipidemia Brother   . Prostate cancer Father        Bone    Review of Systems  Genitourinary:       Vulvar itching Change in stool   Musculoskeletal:       Muscle weakness  All other systems reviewed and are negative.   Exam:   Vitals:   04/23/18 1517  BP: 138/68  Pulse: 62  Resp: 14    General appearance: alert, cooperative and appears stated age Head: Normocephalic, without obvious abnormality, atraumatic Neck: no adenopathy, supple, symmetrical, trachea midline and thyroid normal to inspection and palpation Lungs: clear to auscultation bilaterally Breasts: normal appearance, no masses or tenderness Heart: regular rate and rhythm Abdomen: soft, non-tender; bowel sounds normal; no masses,  no organomegaly Extremities: extremities normal, atraumatic, no cyanosis or edema Skin: Skin color, texture, turgor normal. No rashes or lesions Lymph nodes: Cervical, supraclavicular, and axillary nodes normal. No abnormal inguinal nodes palpated Neurologic: Grossly normal   Pelvic: External genitalia:  no lesions              Urethra:  normal appearing urethra with no masses, tenderness or lesions              Bartholins and Skenes: normal                 Vagina: normal appearing vagina with normal color and discharge, no lesions              Cervix: absent              Pap taken: No. Bimanual Exam:  Uterus:  uterus absent              Adnexa: normal adnexa and no mass, fullness, tenderness               Rectovaginal: Confirms               Anus:  normal sphincter tone, no lesions  Chaperone was present for exam.  A:  Well Woman with normal exam PMP, no HRT H/o TAH/BSO H/o recurrent vaginitis that has improved over the past several months Mild osteopenia H/o interstitial cystitis   P:   Mammogram  guidelines reviewed.  Doing 3D yearly.   pap smear not indicated RUQ  ultrasound ordered Lipase level repeated today RF for vivelle dot 0.025mg  patches.  Will try to decrease dosage this year.  #24/4RF Needs to start pneumonia vaccinations.  Rx for pneumovax given Colonoscopy UTD Lab work done with Dr. Melford Aase return annually or prn

## 2018-04-25 LAB — LIPASE: Lipase: 68 U/L (ref 14–72)

## 2018-04-26 ENCOUNTER — Encounter: Payer: Self-pay | Admitting: Obstetrics & Gynecology

## 2018-04-27 ENCOUNTER — Telehealth: Payer: Self-pay | Admitting: Obstetrics & Gynecology

## 2018-04-27 ENCOUNTER — Encounter: Payer: Self-pay | Admitting: Obstetrics & Gynecology

## 2018-04-27 NOTE — Telephone Encounter (Signed)
Spoke with patient. Advised of 04/23/18 Lipase 68, WNL. Recommended patient keep US Abdomen limited RUQ for further evaluation of RUQ pain. Advised Dr. Sabra Heck still needs to review labs, our office will return call if any additional recommendations. Patient verbalizes understanding and is agreeable.   Dr. Sabra Heck -any additional f/u needed?

## 2018-04-27 NOTE — Telephone Encounter (Signed)
Patient sent the following correspondence through [place]. Routing to triage to assist patient with request.  Message   I have the abdominal ultrasound scheduled for Thursday morning and am wondering if you have received the test result yet from the lab done Friday afternoon. The woman who took my blood thought the results would come in yesterday morning...    Thanks,  Helen Glover

## 2018-04-28 DIAGNOSIS — M542 Cervicalgia: Secondary | ICD-10-CM | POA: Diagnosis not present

## 2018-04-28 NOTE — Telephone Encounter (Signed)
No additional recommendations.  I do want her to keep the ultrasound appt as you've already advised.  Thanks.  Ok to close encounter.

## 2018-04-28 NOTE — Telephone Encounter (Signed)
Encounter closed

## 2018-04-29 ENCOUNTER — Encounter: Payer: Self-pay | Admitting: Obstetrics & Gynecology

## 2018-04-29 ENCOUNTER — Telehealth: Payer: Self-pay | Admitting: Obstetrics & Gynecology

## 2018-04-29 ENCOUNTER — Ambulatory Visit
Admission: RE | Admit: 2018-04-29 | Discharge: 2018-04-29 | Disposition: A | Discharge status: Home / Self Care | Payer: Medicare Other | Source: Ambulatory Visit | Attending: Obstetrics & Gynecology | Admitting: Obstetrics & Gynecology

## 2018-04-29 DIAGNOSIS — R1011 Right upper quadrant pain: Secondary | ICD-10-CM

## 2018-04-29 NOTE — Telephone Encounter (Signed)
Patient sent the following correspondence through [place]. Routing to triage to assist patient with request.  Message   Read the result of the Lipase retest results and noticed the first time the normal range was through 60 and this time through 77...with the low ranges being different as well. I found this a little confusing...if it is 63, I was in the normal range...    I had my ultrasound this morning...due to the computer issues with [place] at the time of my screening, they were not certain when it would be read by the radiologist. She thought we might get the results back tomorrow.    Thanks,  Marliss Coots.

## 2018-04-30 NOTE — Telephone Encounter (Signed)
Routing to Dr. Sabra Heck to review 04/29/18 abdominal US and advise on f/u.

## 2018-05-02 NOTE — Telephone Encounter (Signed)
Please let her know her RUQ ultrasound was negative. Ok to remove from imaging hold.  I think she's asking why are the "normal ranges" different?  It's because they were done with different labs--the first was with [facility] and the second with [facility].  Most labs do not have exactly the same reference ranges.  What matters is that the lipase level went down and was in the normal range.

## 2018-05-03 NOTE — Telephone Encounter (Signed)
Spoke with patient.  1. Advised of results as seen below per Dr. Sabra Heck. Patient verbalizes understanding.   2. Patient reports increased gas and 6 loose stools per day. Denies watery stools, fever/chills, N/V. Patient states she is going to f/u with her PCP for further evaluation, wants Dr. Sabra Heck to be aware. Advised I will update Dr. Sabra Heck and return call if any additional recommendations.   Routing to provider for final review. Patient is agreeable to disposition. Will close encounter.

## 2018-05-03 NOTE — Telephone Encounter (Signed)
Left message to call Majesti Gambrell, RN at [facility] [phone].   

## 2018-05-06 DIAGNOSIS — M542 Cervicalgia: Secondary | ICD-10-CM | POA: Diagnosis not present

## 2018-05-08 ENCOUNTER — Other Ambulatory Visit: Payer: Self-pay | Admitting: Obstetrics & Gynecology

## 2018-05-10 NOTE — Telephone Encounter (Signed)
Medication refill request: vivelle dot 0.0375mg   Last AEX:  04-23-2018 Next AEX: 09-13-2019 Last MMG (if hormonal medication request): 02-01-18 negative Refill authorized: rx was sent in for vivelle dot 0.025mg  to decrease dosage at aex. This rx for 0.0375 is being denied.

## 2018-05-11 DIAGNOSIS — L0889 Other specified local infections of the skin and subcutaneous tissue: Secondary | ICD-10-CM | POA: Diagnosis not present

## 2018-05-11 DIAGNOSIS — L011 Impetiginization of other dermatoses: Secondary | ICD-10-CM | POA: Diagnosis not present

## 2018-05-11 DIAGNOSIS — L308 Other specified dermatitis: Secondary | ICD-10-CM | POA: Diagnosis not present

## 2018-05-11 DIAGNOSIS — L438 Other lichen planus: Secondary | ICD-10-CM | POA: Diagnosis not present

## 2018-05-13 DIAGNOSIS — M542 Cervicalgia: Secondary | ICD-10-CM | POA: Diagnosis not present

## 2018-05-20 DIAGNOSIS — M542 Cervicalgia: Secondary | ICD-10-CM | POA: Diagnosis not present

## 2018-06-17 NOTE — Progress Notes (Addendum)
Virtual Visit via Televideo Note  I connected with Helen Glover on 06/18/18 at 10:30 AM EDT by virtual visit by televideo modality and verified that I am speaking with the correct person using two identifiers.   I discussed the limitations, risks, security and privacy concerns of performing an evaluation and management service and the availability of in person appointments. I also discussed with the patient that there may be a patient responsible charge related to this service. The patient expressed understanding and agreed to proceed.   MEDICARE ANNUAL WELLNESS VISIT AND FOLLOW UP Assessment:   Diagnoses and all orders for this visit:  Medicare annual wellness visit, subsequent  Essential hypertension Hypertension No medications at this time Monitor blood pressure  Continue DASH diet.   Reminder to go to the ER if any CP, SOB, nausea, dizziness, severe HA, changes vision/speech, left arm numbness and tingling and jaw pain.  Gastroesophageal reflux disease, esophagitis presence not specified Taking omeprazole only once a day in the evening Discussed changing the timing of this to morning to improve afternoon symptoms? May need BID dosing?  S/P laminectomy with spinal fusion C1&C2 Doing well, working on PT for this Follows with Dr Matthew Saras Next appointment 6/23 Pain well controlled, using tylenol PRN  Prediabetes Dicussed dietary and exercise modifications Will check labs next office visit  RUQ abdominal pain Improving, not resolved Discussed follow FODMAP diet, did not do so after 2/25 appointment.  Provided educational information on this. Discussed keeping dietary log to help pinpoint triggers. Continue probiotic daily   Hyperlipidemia, mixed Discussed dietary and exercise modifications  Medication management continued   Follow Up Instructions:    I discussed the assessment and treatment plan with the patient. The patient was provided an opportunity to ask  questions and all were answered. The patient agreed with the plan and demonstrated an understanding of the instructions.   The patient was advised to call back or seek an in-person evaluation if the symptoms worsen or if the condition fails to improve as anticipated.  I provided 30 minutes of non-face-to-face time during this encounter including counseling, chart review, and critical decision making was preformed.   Future Appointments  Date Time Provider [place]  07/23/2018 11:00 AM Unk Pinto, MD [facility] None  07/08/2019 10:30 AM Garnet Sierras, NP [facility] None  09/13/2019  2:00 PM Megan Salon, MD [place] None      Plan:   During the course of the visit the patient was educated and counseled about appropriate screening and preventive services including:    Pneumococcal vaccine   Influenza vaccine  Prevnar 13  Td vaccine  Screening electrocardiogram, deferred, telephone visit [place].  Colorectal cancer screening  Diabetes screening  Glaucoma screening  Nutrition counseling    Subjective:  Helen Glover is a 68 y.o. female who presents for Medicare Annual Wellness Visit and follow up for HTN, hyperlipidemia, prediabetes, GERD and vitamin D Def.   02/25/18 patient had C1-C2 posterior laminectomy and fusion by Dr Matthew Saras.  She reports that she is doing well with this.  She continues to do her stretches, exercises for strengthening. She denies any complications with ROM, swallowing, numbness or tingling in upper extremities.  Endorses decreased sensation to back of head.     She has had chronic low back pain.  She has been managing by laying flat.  She reports she has not been able to do this for back relife as she is having abdomina discomfort when laying down.  She  reports this extends between her hip bones and above the umbilicus.  She is having 2-3 bowel movements a day that are formed.  She is still having lots of gas, although reports this is  better from last visit.  Reports that she is mainly having bloating and fullness in the afternoon.  She is taking omeprazole 20mg  in the evening.  She is also taking a probiotic tablet daily. She has completed her yearly appointment  with OBGYN and switched to 0.025mg  Estradial patch.  She was also sent for abdominal ultrasound that was unremarkable.  Her blood pressure has been controlled but does not check this at home and unable to check this prior to appointment today. She does workout. She denies chest pain, shortness of breath, dizziness.  She is not on cholesterol medication and denies myalgias. Her cholesterol is not at goal. The cholesterol last visit was:   Lab Results  Component Value Date   CHOL 224 (H) 01/04/2018   HDL 97 01/04/2018   LDLCALC 109 (H) 01/04/2018   TRIG 89 01/04/2018   CHOLHDL 2.3 01/04/2018   She has been working on diet and exercise for prediabetes, and denies hyperglycemia, hypoglycemia , increased appetite, nausea, paresthesia of the feet, polydipsia, polyuria, visual disturbances, vomiting and weight loss. Last A1C in the office was:  Lab Results  Component Value Date   HGBA1C 5.4 01/04/2018   Last GFR Lab Results  Component Value Date   GFRNONAA 92 04/13/2018     Lab Results  Component Value Date   GFRAA 106 04/13/2018   Patient is on Vitamin D supplement.   Lab Results  Component Value Date   VD25OH 69 12/25/2015      Medication Review:  Current Outpatient Medications (Endocrine & Metabolic):  .  estradiol (VIVELLE-DOT) 0.025 MG/24HR, Place 1 patch onto the skin 2 (two) times a week.     Current Outpatient Medications (Hematological):  .  vitamin B-12 (CYANOCOBALAMIN) 100 MCG tablet, Take 100 mcg by mouth daily.  Current Outpatient Medications (Other):  Marland Kitchen  Ascorbic Acid (VITAMIN C) 100 MG tablet, Take 100 mg by mouth daily. .  Biotin 1 MG CAPS, Take 1 capsule by mouth daily.  .  calcium carbonate (OS-CAL - DOSED IN MG OF ELEMENTAL  CALCIUM) 1250 (500 Ca) MG tablet, Take 1 tablet by mouth. .  cholecalciferol (VITAMIN D) 1000 UNITS tablet, Take 3,000 Units by mouth daily.  .  cyclobenzaprine (FLEXERIL) 5 MG tablet, Take 1 tablet (5 mg total) by mouth 3 (three) times daily as needed for muscle spasms. Marland Kitchen  omeprazole (PRILOSEC) 20 MG capsule, TAKE 1 CAPSULE (20 MG TOTAL) BY MOUTH 2 (TWO) TIMES DAILY BEFORE A MEAL. .  Probiotic Product (PROBIOTIC PO), Take by mouth daily. Marland Kitchen  pyridOXINE (VITAMIN B-6) 100 MG tablet, Take 100 mg by mouth daily.  Allergies: Allergies  Allergen Reactions  . Other     Band-aid  . Codeine Hives  . Sulfa Antibiotics Nausea And Vomiting    Current Problems (verified) has Interstitial cystitis; GERD (gastroesophageal reflux disease); Hyperlipidemia; Hypertension; Prediabetes; Hearing loss of both ears; and Adhesive capsulitis of right shoulder on their problem list.  Screening Tests Immunization History  Administered Date(s) Administered  . DTaP 10/04/2010  . Influenza, High Dose Seasonal PF 11/24/2016  . Influenza,inj,quad, With Preservative 11/17/2016  . Influenza-Unspecified 11/29/2012, 11/29/2013, 11/17/2014, 11/27/2015, 11/24/2016, 10/04/2017  . Pneumococcal Conjugate-13 12/21/2013  . Pneumococcal Polysaccharide-23 12/25/2016  . Pneumococcal-Unspecified 12/04/2002  . Zoster 12/03/2005  . Zoster Recombinat (Shingrix)  05/22/2016    Preventative care: Last colonoscopy:3/15, neg Received letter to get screening  Mammogram: 01/2018, neg Pap 5/15, neg DEXA: 12/18 Osteopenia, DUE  Hep C 02/2014, Neg  Prior vaccinations: TD or Tdap: 2012  Influenza: 2019 Pneumococcal: 2018 Prevnar13: 2004 Shingles/Shringrix: 2018  Names of Other Physician/Practitioners you currently use: 1. [facility] here for primary care 2. Eye Exam , Dr  3. Dentist Exam Dr Toy Cookey 03/2018  Patient Care Team: Unk Pinto, MD as PCP - General (Internal  Medicine) Barbaraann Cao, OD as Referring Physician (Optometry) Teena Irani, MD (Inactive) as Consulting Physician (Gastroenterology) Marlou Sa Tonna Corner, MD as Consulting Physician (Orthopedic Surgery) Berenice Primas, MD as Referring Physician (Orthopedic Surgery) Megan Salon, MD as Consulting Physician (Gynecology) Ardis Hughs, MD as Attending Physician (Urology) Leta Baptist, MD as Consulting Physician (Otolaryngology)  Surgical: She  has a past surgical history that includes Cesarean section; Myringotomy; Lumbar laminectomy; Carpal tunnel release; Trigger finger release; Anal sphincterotomy; Nasal sinus surgery; Abdominal hysterectomy (2003); Hemorrhoid surgery (N/A, 1998); Cystoscopy; Tympanostomy tube placement; Tonsillectomy; Tonsillectomy (10/14); Lumbar laminectomy/decompression microdiscectomy (N/A, 08/09/2014); and Cervical fusion (02/25/2018). Family Her family history includes Diabetes in her mother; Hyperlipidemia in her brother and mother; Hypertension in her brother and mother; Osteoporosis in her mother; Prostate cancer in her father. Social history  She reports that she quit smoking about 31 years ago. She has a 7.50 pack-year smoking history. She has never used smokeless tobacco. She reports current alcohol use of about 6.0 - 7.0 standard drinks of alcohol per week. She reports that she does not use drugs.  MEDICARE WELLNESS OBJECTIVES: Physical activity: Current Exercise Habits: Home exercise routine, Type of exercise: walking;stretching, Time (Minutes): 15, Frequency (Times/Week): 5, Weekly Exercise (Minutes/Week): 75, Intensity: Mild, Exercise limited by: orthopedic condition(s) Cardiac risk factors: Cardiac Risk Factors include: advanced age (>24men, >71 women);dyslipidemia Depression/mood screen:   Depression screen [facility] 2/9 06/23/2018  Decreased Interest 0  Down, Depressed, Hopeless 0  PHQ - 2 Score 0    ADLs:  In your present state of health, do you  have any difficulty performing the following activities: 06/23/2018 06/18/2018  Hearing? N N  Vision? N N  Difficulty concentrating or making decisions? N N  Walking or climbing stairs? N N  Dressing or bathing? - N  Doing errands, shopping? - N  Conservation officer, nature and eating ? - N  Using the Toilet? - N  In the past six months, have you accidently leaked urine? - N  Do you have problems with loss of bowel control? - N  Managing your Medications? - N  Managing your Finances? - N  Housekeeping or managing your Housekeeping? - N  Some recent data might be hidden     Cognitive Testing  Alert? Yes  Normal Appearance?Yes  Oriented to person? Yes  Place? Yes   Time? Yes  Recall of three objects?  Yes  Can perform simple calculations? Yes  Displays appropriate judgment?Yes  Can read the correct time from a watch face?Yes  EOL planning: Does Patient Have a Medical Advance Directive?: Yes Type of Advance Directive: Healthcare Power of Attorney, Living will Does patient want to make changes to medical advance directive?: Yes (Inpatient - patient defers changing a medical advance directive at this time - Information given) Copy of [place] in Chart?: No - copy requested   Objective:   Today's Vitals   06/18/18 1043  Temp: (!) 96.5 F (35.8 C)  Weight:  115 lb (52.2 kg)  Height: 4\' 10"  (1.473 m)   Body mass index is 24.04 kg/m.  General : Well sounding patient in no apparent distress HEENT: no hoarseness, no cough for duration of visit Lungs: speaks in complete sentences, no audible wheezing, no apparent distress Neurological: alert, oriented x 3 Psychiatric: pleasant, judgement appropriate   Medicare Attestation I have personally reviewed: The patient's medical and social history Their use of alcohol, tobacco or illicit drugs Their current medications and supplements The patient's functional ability including ADLs,fall risks, home safety risks, cognitive, and  hearing and visual impairment Diet and physical activities Evidence for depression or mood disorders  The patient's weight, height, BMI, and visual acuity have been recorded in the chart.  I have made referrals, counseling, and provided education to the patient based on review of the above and I have provided the patient with a written personalized care plan for preventive services.     Garnet Sierras, NP [facility] 06/23/2018  4:27 PM

## 2018-06-18 ENCOUNTER — Other Ambulatory Visit: Payer: Self-pay

## 2018-06-18 ENCOUNTER — Ambulatory Visit: Payer: PPO | Admitting: Adult Health Nurse Practitioner

## 2018-06-18 ENCOUNTER — Encounter: Payer: Self-pay | Admitting: Adult Health Nurse Practitioner

## 2018-06-18 VITALS — Temp 96.5°F | Ht <= 58 in | Wt 115.0 lb

## 2018-06-18 DIAGNOSIS — E782 Mixed hyperlipidemia: Secondary | ICD-10-CM

## 2018-06-18 DIAGNOSIS — R7303 Prediabetes: Secondary | ICD-10-CM | POA: Diagnosis not present

## 2018-06-18 DIAGNOSIS — Z79899 Other long term (current) drug therapy: Secondary | ICD-10-CM | POA: Diagnosis not present

## 2018-06-18 DIAGNOSIS — K219 Gastro-esophageal reflux disease without esophagitis: Secondary | ICD-10-CM

## 2018-06-18 DIAGNOSIS — Z0001 Encounter for general adult medical examination with abnormal findings: Secondary | ICD-10-CM | POA: Diagnosis not present

## 2018-06-18 DIAGNOSIS — R1011 Right upper quadrant pain: Secondary | ICD-10-CM

## 2018-06-18 DIAGNOSIS — Z0000 Encounter for general adult medical examination without abnormal findings: Secondary | ICD-10-CM

## 2018-06-18 DIAGNOSIS — R6889 Other general symptoms and signs: Secondary | ICD-10-CM | POA: Diagnosis not present

## 2018-06-18 DIAGNOSIS — Z981 Arthrodesis status: Secondary | ICD-10-CM

## 2018-06-18 DIAGNOSIS — I10 Essential (primary) hypertension: Secondary | ICD-10-CM

## 2018-06-23 NOTE — Patient Instructions (Addendum)
You had a telephone visit 06/18/18 with Helen Glover, [place].  Below is a summary of your visit.  Health Maintenance  Topic Date Due  . Flu Shot  09/18/2018  . Mammogram  02/02/2019  . Tetanus Vaccine  09/23/2020  . Colon Cancer Screening  04/28/2023  . DEXA scan (bone density measurement)  Completed  .  Hepatitis C: One time screening is recommended by Center for Disease Control  (CDC) for  adults born from [birth year] through 1964.   Completed  . Pneumonia vaccines  Completed    Try taking the omeprazole in the morning to see if the afternoon abdominal discomfort improves.   Try taking a probiotic supplement daily.  Digestive Advantage and Align are two that I have seen success with in patients.  Below is information on FODMAP diet we discussed.  Please go to the ER if you have any severe AB pain, unable to hold down food/water, blood in stool or vomit, chest pain, shortness of breath, or any worsening symptoms.   Consider keeping a food diary- common causes of diarrhea are dairy, certain carbs...  FODMAP stands for fermentable oligo-, di-, mono-saccharides and polyols (1). These are the scientific terms used to classify groups of carbs that are notorious for triggering digestive symptoms like bloating, gas and stomach pain.   FODMAPs are found in a wide range of foods in varying amounts. Some foods contain just one type, while others contain several.  The main dietary sources of the four groups of FODMAPs include:  Oligosaccharides: Wheat, rye, legumes and various fruits and vegetables, such as garlic and onions.  Disaccharides: Milk, yogurt and soft cheese. Lactose is the main carb.  Monosaccharides: Various fruit including figs and mangoes, and sweeteners such as honey and agave nectar. Fructose is the main carb.  Polyols: Certain fruits and vegetables including blackberries and lychee, as well as some low-calorie sweeteners like those in sugar-free gum.   Keep a food diary.  This will help you identify foods that cause symptoms. Write down: ? What you eat and when. ? What symptoms you have. ? When symptoms occur in relation to your meals.  Avoid foods that cause symptoms. Talk with your dietitian about other ways to get the same nutrients that are in these foods.  Eat your meals slowly, in a relaxed setting.  Aim to eat 5-6 small meals per day. Do not skip meals.  Drink enough fluids to keep your urine clear or pale yellow.  Ask your health care provider if you should take an over-the-counter probiotic during flare-ups to help restore healthy gut bacteria.  If you have cramping or diarrhea, try making your meals low in fat and high in carbohydrates. Examples of carbohydrates are pasta, rice, whole grain breads and cereals, fruits, and vegetables.  If dairy products cause your symptoms to flare up, try eating less of them. You might be able to handle yogurt better than other dairy products because it contains bacteria that help with digestion.         Helen Glover , Thank you for taking time to come for your Medicare Wellness Visit. I appreciate your ongoing commitment to your health goals. Please review the following plan we discussed and let me know if I can assist you in the future.   This is a list of the screening recommended for you and due dates:  Health Maintenance  Topic Date Due  . Flu Shot  09/18/2018  . Mammogram  02/02/2019  . Tetanus Vaccine  09/23/2020  . Colon Cancer Screening  04/28/2023  . DEXA scan (bone density measurement)  Completed  .  Hepatitis C: One time screening is recommended by Center for Disease Control  (CDC) for  adults born from [birth year] through 1964.   Completed  . Pneumonia vaccines  Completed

## 2018-06-28 DIAGNOSIS — R197 Diarrhea, unspecified: Secondary | ICD-10-CM | POA: Diagnosis not present

## 2018-07-06 DIAGNOSIS — M542 Cervicalgia: Secondary | ICD-10-CM | POA: Diagnosis not present

## 2018-07-13 DIAGNOSIS — H40013 Open angle with borderline findings, low risk, bilateral: Secondary | ICD-10-CM | POA: Diagnosis not present

## 2018-07-13 DIAGNOSIS — H5203 Hypermetropia, bilateral: Secondary | ICD-10-CM | POA: Diagnosis not present

## 2018-07-13 DIAGNOSIS — H40053 Ocular hypertension, bilateral: Secondary | ICD-10-CM | POA: Diagnosis not present

## 2018-07-13 DIAGNOSIS — H52223 Regular astigmatism, bilateral: Secondary | ICD-10-CM | POA: Diagnosis not present

## 2018-07-13 DIAGNOSIS — H524 Presbyopia: Secondary | ICD-10-CM | POA: Diagnosis not present

## 2018-07-14 DIAGNOSIS — D126 Benign neoplasm of colon, unspecified: Secondary | ICD-10-CM | POA: Diagnosis not present

## 2018-07-14 DIAGNOSIS — R197 Diarrhea, unspecified: Secondary | ICD-10-CM | POA: Diagnosis not present

## 2018-07-14 DIAGNOSIS — K5730 Diverticulosis of large intestine without perforation or abscess without bleeding: Secondary | ICD-10-CM | POA: Diagnosis not present

## 2018-07-14 DIAGNOSIS — Z8371 Family history of colonic polyps: Secondary | ICD-10-CM | POA: Diagnosis not present

## 2018-07-14 LAB — HM COLONOSCOPY

## 2018-07-16 DIAGNOSIS — D126 Benign neoplasm of colon, unspecified: Secondary | ICD-10-CM | POA: Diagnosis not present

## 2018-07-20 DIAGNOSIS — H7203 Central perforation of tympanic membrane, bilateral: Secondary | ICD-10-CM | POA: Diagnosis not present

## 2018-07-20 DIAGNOSIS — H903 Sensorineural hearing loss, bilateral: Secondary | ICD-10-CM | POA: Diagnosis not present

## 2018-07-23 ENCOUNTER — Encounter: Payer: Self-pay | Admitting: Internal Medicine

## 2018-07-23 DIAGNOSIS — R195 Other fecal abnormalities: Secondary | ICD-10-CM | POA: Diagnosis not present

## 2018-07-23 DIAGNOSIS — R140 Abdominal distension (gaseous): Secondary | ICD-10-CM | POA: Diagnosis not present

## 2018-07-23 DIAGNOSIS — R109 Unspecified abdominal pain: Secondary | ICD-10-CM | POA: Diagnosis not present

## 2018-07-26 ENCOUNTER — Other Ambulatory Visit: Payer: Self-pay | Admitting: Gastroenterology

## 2018-07-26 DIAGNOSIS — R1084 Generalized abdominal pain: Secondary | ICD-10-CM

## 2018-07-27 ENCOUNTER — Encounter: Payer: Self-pay | Admitting: *Deleted

## 2018-07-28 ENCOUNTER — Ambulatory Visit
Admission: RE | Admit: 2018-07-28 | Discharge: 2018-07-28 | Disposition: A | Discharge status: Home / Self Care | Payer: Medicare Other | Source: Ambulatory Visit | Attending: Gastroenterology | Admitting: Gastroenterology

## 2018-07-28 DIAGNOSIS — R195 Other fecal abnormalities: Secondary | ICD-10-CM | POA: Diagnosis not present

## 2018-07-28 DIAGNOSIS — R1084 Generalized abdominal pain: Secondary | ICD-10-CM | POA: Diagnosis not present

## 2018-07-28 MED ORDER — IOPAMIDOL (ISOVUE-300) INJECTION 61%
100.0000 mL | Freq: Once | INTRAVENOUS | Status: AC | PRN
  Administered 2018-07-28: 100 mL via INTRAVENOUS

## 2018-08-10 DIAGNOSIS — M5442 Lumbago with sciatica, left side: Secondary | ICD-10-CM | POA: Diagnosis not present

## 2018-08-10 DIAGNOSIS — M5441 Lumbago with sciatica, right side: Secondary | ICD-10-CM | POA: Diagnosis not present

## 2018-08-10 DIAGNOSIS — M452 Ankylosing spondylitis of cervical region: Secondary | ICD-10-CM | POA: Diagnosis not present

## 2018-08-10 DIAGNOSIS — Z981 Arthrodesis status: Secondary | ICD-10-CM | POA: Diagnosis not present

## 2018-08-10 DIAGNOSIS — M545 Low back pain: Secondary | ICD-10-CM | POA: Diagnosis not present

## 2018-08-10 DIAGNOSIS — M532X1 Spinal instabilities, occipito-atlanto-axial region: Secondary | ICD-10-CM | POA: Diagnosis not present

## 2018-08-10 NOTE — Progress Notes (Signed)
CPE  Assessment:   Essential hypertension - continue medications, DASH diet, exercise and monitor at home. Call if greater than 130/80. -     CBC with Differential/Platelet -     BASIC METABOLIC PANEL WITH GFR -     Hepatic function panel -     TSH -     Urinalysis, Routine w reflex microscopic -     Microalbumin / creatinine urine ratio  Hyperlipidemia, unspecified hyperlipidemia type -continue medications, check lipids, decrease fatty foods, increase activity.  -     Lipid panel  Abnormal glucose Monitor  Bilateral hearing loss, unspecified hearing loss type Hearing aids  Gastroesophageal reflux disease, esophagitis presence not specified GERD- will try to get off PPiI given info for taper and zantac sent in  Interstitial cystitis Monitor, diet discussed  Vitamin D deficiency Continue supplement  Medication management -     Magnesium  BMI 24.0-24.9, adult   Over 40 minutes of exam, counseling, chart review and critical decision making was performed Future Appointments  Date Time Provider [place]  07/08/2019 10:30 AM Garnet Sierras, NP [facility] None  08/18/2019  2:00 PM Vicie Mutters, PA-C [facility] None  09/13/2019  2:00 PM Megan Salon, MD [place] None     Plan:   During the course of the visit the patient was educated and counseled about appropriate screening and preventive services including:    Pneumococcal vaccine   Prevnar 13  Influenza vaccine  Td vaccine  Screening electrocardiogram  Bone densitometry screening  Colorectal cancer screening  Diabetes screening  Glaucoma screening  Nutrition counseling   Advanced directives: requested   Subjective:  Helen Glover is a 68 y.o. female who presents for WELCOME TO Medicare Annual Wellness Visit and complete physical.    She had abnormal stools/gas in Feb, had colonoscopy and CT AB with GI April. She states she is improving, on benefiber. 1-2 a day, first in the AM,  formed stool.  She is getting an MRI of her back with her neurosurgeon, Dr. Salem Caster in [place].   Her blood pressure has been controlled at home, today their BP is BP: 122/80 She does workout, not as much due to right shoulder pain, following ortho. She denies chest pain, shortness of breath, dizziness.  She is not on cholesterol medication and denies myalgias. Her cholesterol is at goal. The cholesterol last visit was:   Lab Results  Component Value Date   CHOL 224 (H) 01/04/2018   HDL 97 01/04/2018   LDLCALC 109 (H) 01/04/2018   TRIG 89 01/04/2018   CHOLHDL 2.3 01/04/2018    Last A1C in the office was:  Lab Results  Component Value Date   HGBA1C 5.4 01/04/2018   Last GFR: Lab Results  Component Value Date   GFRNONAA 92 04/13/2018   Patient is on Vitamin D supplement.   Lab Results  Component Value Date   VD25OH 69 12/25/2015     BMI is Body mass index is 23.95 kg/m., she is working on diet and exercise. Wt Readings from Last 3 Encounters:  08/11/18 118 lb 9.6 oz (53.8 kg)  06/18/18 115 lb (52.2 kg)  04/23/18 115 lb (52.2 kg)    Medication Review: Current Outpatient Medications on File Prior to Visit  Medication Sig Dispense Refill  . Ascorbic Acid (VITAMIN C) 100 MG tablet Take 100 mg by mouth daily.    . Biotin 1 MG CAPS Take 1 capsule by mouth daily.     Marland Kitchen  calcium carbonate (OS-CAL - DOSED IN MG OF ELEMENTAL CALCIUM) 1250 (500 Ca) MG tablet Take 1 tablet by mouth.    . cholecalciferol (VITAMIN D) 1000 UNITS tablet Take 3,000 Units by mouth daily.     Marland Kitchen estradiol (VIVELLE-DOT) 0.025 MG/24HR Place 1 patch onto the skin 2 (two) times a week. 24 patch 4  . Probiotic Product (PROBIOTIC PO) Take by mouth daily.    Marland Kitchen pyridOXINE (VITAMIN B-6) 100 MG tablet Take 100 mg by mouth daily.    . vitamin B-12 (CYANOCOBALAMIN) 100 MCG tablet Take 100 mcg by mouth daily.    Marland Kitchen omeprazole (PRILOSEC) 20 MG capsule TAKE 1 CAPSULE (20 MG TOTAL) BY MOUTH 2 (TWO) TIMES DAILY  BEFORE A MEAL. 180 capsule 3   No current facility-administered medications on file prior to visit.     Allergies  Allergen Reactions  . Other     Band-aid  . Codeine Hives  . Sulfa Antibiotics Nausea And Vomiting    Current Problems (verified) Patient Active Problem List   Diagnosis Date Noted  . Adhesive capsulitis of right shoulder 08/18/2017  . Hearing loss of both ears 12/25/2014  . Hyperlipidemia   . Hypertension   . Prediabetes   . GERD (gastroesophageal reflux disease) 11/17/2011  . Interstitial cystitis     Screening Tests Immunization History  Administered Date(s) Administered  . DTaP 10/04/2010  . Influenza, High Dose Seasonal PF 11/24/2016  . Influenza,inj,quad, With Preservative 11/17/2016  . Influenza-Unspecified 11/29/2012, 11/29/2013, 11/17/2014, 11/27/2015, 11/24/2016, 10/04/2017  . Pneumococcal Conjugate-13 12/21/2013  . Pneumococcal Polysaccharide-23 12/25/2016  . Pneumococcal-Unspecified 12/04/2002  . Zoster 12/03/2005  . Zoster Recombinat (Shingrix) 05/22/2016   Preventative care: Last colonoscopy: 2020 Dr. Amedeo Plenty Last mammogram: 01/2018 Last pap smear/pelvic exam: 06/27/2013 Dr. Sabra Heck DEXA: 2018 Dr. Sabra Heck due this year AB [place] 10/2016 CT AB 07/2018  Prior vaccinations: TD or Tdap: 2012  Influenza: 2019  Pneumococcal: 12/25/2016 Prevnar13: 2015 Shingles/Zostavax: 2007 Shingrex 2018  Names of Other Physician/Practitioners you currently use: 1.  [facility] here for primary care 2. Donato Heinz, eye doctor, last visit Spring 2017, wears glasses 3. Dr. Toy Cookey, dentist, last visit Dr. Toy Cookey  Patient Care Team: Unk Pinto, MD as PCP - General (Internal Medicine) Barbaraann Cao, OD as Referring Physician (Optometry) Teena Irani, MD (Inactive) as Consulting Physician (Gastroenterology) Marlou Sa Tonna Corner, MD as Consulting Physician (Orthopedic Surgery) Berenice Primas, MD as Referring  Physician (Orthopedic Surgery) Megan Salon, MD as Consulting Physician (Gynecology) Ardis Hughs, MD as Attending Physician (Urology) Leta Baptist, MD as Consulting Physician (Otolaryngology)  SURGICAL HISTORY She  has a past surgical history that includes Cesarean section; Myringotomy; Lumbar laminectomy; Carpal tunnel release; Trigger finger release; Anal sphincterotomy; Nasal sinus surgery; Abdominal hysterectomy (2003); Hemorrhoid surgery (N/A, 1998); Cystoscopy; Tympanostomy tube placement; Tonsillectomy; Tonsillectomy (10/14); Lumbar laminectomy/decompression microdiscectomy (N/A, 08/09/2014); and Cervical fusion (02/25/2018).   FAMILY HISTORY Her family history includes Diabetes in her mother; Hyperlipidemia in her brother and mother; Hypertension in her brother and mother; Osteoporosis in her mother; Prostate cancer in her father.   SOCIAL HISTORY She  reports that she quit smoking about 31 years ago. She has a 7.50 pack-year smoking history. She has never used smokeless tobacco. She reports current alcohol use of about 6.0 - 7.0 standard drinks of alcohol per week. She reports that she does not use drugs.   Review of Systems  Constitutional: Negative.   HENT: Negative.   Eyes: Negative.   Respiratory: Negative.  Cardiovascular: Negative.   Gastrointestinal: Negative.   Genitourinary: Negative.   Musculoskeletal: Positive for joint pain. Negative for back pain, falls, myalgias and neck pain.  Skin: Negative.      Objective:     Today's Vitals   08/11/18 1405  BP: 122/80  Pulse: 91  Temp: 97.9 F (36.6 C)  SpO2: 97%  Weight: 118 lb 9.6 oz (53.8 kg)  Height: 4\' 11"  (1.499 m)   Body mass index is 23.95 kg/m.  General appearance: alert, no distress, WD/WN, female HEENT: normocephalic, sclerae anicteric, right TM with tube, left TM with perforation, nares patent, no discharge or erythema, pharynx normal Oral cavity: MMM, no lesions Neck: supple, no  lymphadenopathy, no thyromegaly, no masses Heart: RRR, normal S1, S2, no murmurs Lungs: CTA bilaterally, no wheezes, rhonchi, or rales Abdomen: +bs, soft, non tender, non distended, no masses, no hepatomegaly, no splenomegaly Musculoskeletal: nontender, no swelling, no obvious deformity, right lateral first digit near MCP with 3 mm hard non mobile nontender nodule Extremities: no edema, no cyanosis, no clubbing Pulses: 2+ symmetric, upper and lower extremities, normal cap refill Neurological: alert, oriented x 3, CN2-12 intact, strength normal upper extremities and lower extremities, sensation normal throughout, DTRs 2+ throughout, no cerebellar signs, gait normal Psychiatric: normal affect, behavior normal, pleasant   EKG had this year prior to surgery Needs Aorta Scan   Vicie Mutters, PA-C   08/11/2018

## 2018-08-11 ENCOUNTER — Encounter: Payer: Self-pay | Admitting: Physician Assistant

## 2018-08-11 ENCOUNTER — Other Ambulatory Visit: Payer: Self-pay

## 2018-08-11 ENCOUNTER — Ambulatory Visit (INDEPENDENT_AMBULATORY_CARE_PROVIDER_SITE_OTHER): Payer: PPO | Admitting: Physician Assistant

## 2018-08-11 VITALS — BP 122/80 | HR 91 | Temp 97.9°F | Ht 59.0 in | Wt 118.6 lb

## 2018-08-11 DIAGNOSIS — Z79899 Other long term (current) drug therapy: Secondary | ICD-10-CM

## 2018-08-11 DIAGNOSIS — K219 Gastro-esophageal reflux disease without esophagitis: Secondary | ICD-10-CM

## 2018-08-11 DIAGNOSIS — N3010 Interstitial cystitis (chronic) without hematuria: Secondary | ICD-10-CM

## 2018-08-11 DIAGNOSIS — E785 Hyperlipidemia, unspecified: Secondary | ICD-10-CM | POA: Diagnosis not present

## 2018-08-11 DIAGNOSIS — I10 Essential (primary) hypertension: Secondary | ICD-10-CM

## 2018-08-11 DIAGNOSIS — H9193 Unspecified hearing loss, bilateral: Secondary | ICD-10-CM

## 2018-08-11 DIAGNOSIS — R7309 Other abnormal glucose: Secondary | ICD-10-CM

## 2018-08-11 DIAGNOSIS — E559 Vitamin D deficiency, unspecified: Secondary | ICD-10-CM

## 2018-08-11 DIAGNOSIS — Z0001 Encounter for general adult medical examination with abnormal findings: Secondary | ICD-10-CM

## 2018-08-11 DIAGNOSIS — Z0000 Encounter for general adult medical examination without abnormal findings: Secondary | ICD-10-CM | POA: Diagnosis not present

## 2018-08-11 NOTE — Patient Instructions (Addendum)
Can try pepcid or an H2 blocker- generic is fine- take this at night and the prilosec in the AM 30 mins before food   Please go to the ER if you have any severe AB pain, unable to hold down food/water, blood in stool or vomit, chest pain, shortness of breath, or any worsening symptoms.   Consider keeping a food diary- common causes of diarrhea are dairy, certain carbs...  FODMAP stands for fermentable oligo-, di-, mono-saccharides and polyols (1). These are the scientific terms used to classify groups of carbs that are notorious for triggering digestive symptoms like bloating, gas and stomach pain.   FODMAPs are found in a wide range of foods in varying amounts. Some foods contain just one type, while others contain several.  The main dietary sources of the four groups of FODMAPs include:  Oligosaccharides: Wheat, rye, legumes and various fruits and vegetables, such as garlic and onions.  Disaccharides: Milk, yogurt and soft cheese. Lactose is the main carb.  Monosaccharides: Various fruit including figs and mangoes, and sweeteners such as honey and agave nectar. Fructose is the main carb.  Polyols: Certain fruits and vegetables including blackberries and lychee, as well as some low-calorie sweeteners like those in sugar-free gum.   Keep a food diary. This will help you identify foods that cause symptoms. Write down: ? What you eat and when. ? What symptoms you have. ? When symptoms occur in relation to your meals.  Avoid foods that cause symptoms. Talk with your dietitian about other ways to get the same nutrients that are in these foods.  Eat your meals slowly, in a relaxed setting.  Aim to eat 5-6 small meals per day. Do not skip meals.  Drink enough fluids to keep your urine clear or pale yellow.  Ask your health care provider if you should take an over-the-counter probiotic during flare-ups to help restore healthy gut bacteria.  If you have cramping or diarrhea, try  making your meals low in fat and high in carbohydrates. Examples of carbohydrates are pasta, rice, whole grain breads and cereals, fruits, and vegetables.  If dairy products cause your symptoms to flare up, try eating less of them. You might be able to handle yogurt better than other dairy products because it contains bacteria that help with digestion.     Anterior Tarsal Tunnel Syndrome Anterior tarsal tunnel syndrome is a condition that happens when pressure is put on a nerve that passes through the front of your ankle. This nerve supplies the muscles that you use to move your foot and toes up toward your shin. It also supplies the skin between your big toe and second toe. What are the causes? This condition may be caused by:  Pressure on the nerve, such as from footwear that is too tight or from a bony growth.  Having an unstable ankle.  Swelling in the front of your ankle. What increases the risk? This condition is more likely to develop in people who participate in sports that:  Involve repeatedly using the front of the ankle to hit something, such as soccer and martial arts.  Involve wearing a tight boot or skate, such as skiing or hockey.  Can easily lead to a sprained ankle. What are the signs or symptoms? Symptoms of this condition can start quickly or develop gradually. Symptoms include:  Pain or aching in the top of the foot.  Numbness or tingling between the big and second toe.  Difficulty bringing the toes upward.  Pain when pointing the foot down. At first, symptoms may be relieved with rest. Over time, symptoms may be present all the time. How is this diagnosed? This condition may be diagnosed based on:  Your symptoms.  Your medical history.  A physical exam.  Tests, such as: ? An X-ray to check your bones. ? MRI to check your nerves and tendons. ? An electromyogram (EMG) to check your nerves. During the physical exam, your health care provider may tap  on the area below your ankle to check for tingling in your foot or toes. Your health care provider may also inject a numbing medicine at the top of your ankle to see if it relieves your pain. How is this treated? Treatment for this condition may involve:  Adding padding to the front of your shoe, skate, or boot to reduce pressure on your nerve.  Using ice to reduce swelling.  Taking anti-inflammatory pain medicine.  Having medicine injected into your ankle joint to reduce pain and swelling.  Doing exercises when pain and swelling improve.  Gradually returning to full activity.  Having surgery. Surgery may be needed if there is a bone growth or if other treatments have not helped. Follow these instructions at home: Managing pain, stiffness, and swelling  If directed, put ice on the injured area: ? Put ice in a plastic bag. ? Place a towel between your skin and the bag. ? Leave the ice on for 20 minutes, 2-3 times a day.  Raise (elevate) the injured area above the level of your heart while you are sitting or lying down.  Take over-the-counter and prescription medicines only as told by your health care provider. Activity  Return to your normal activities as told by your health care provider. Ask your health care provider what activities are safe for you.  Do not place your full body weight on your ankle until your health care provider says that you can.  Do not do any activities that make pain or swelling worse.  Do exercises as told by your health care provider. General instructions  Add padding to your footwear as told by your health care provider.  Keep all follow-up visits as told by your health care provider. This is important. How is this prevented?  Avoid athletic activities that cause ankle pain or swelling.  Do not lace your shoes or boots too tight.  When you play contact sports, wear protective equipment over the front of your ankle.  Do not put your feet  under a solid object like a bar while doing sit ups.  If you start any new athletic activity, start gradually to build up your strength and flexibility. Contact a health care provider if:  Your foot or ankle pain is not getting better after 2-4 weeks of treatment.  You are unable to support (bear) your body weight on your ankle without feeling pain. This information is not intended to replace advice given to you by your health care provider. Make sure you discuss any questions you have with your health care provider. Document Released: 09/04/2004 Document Revised: 10/08/2015 Document Reviewed: 12/22/2014 Elsevier Interactive Patient Education  2019 [employer]   Anterior Tarsal Tunnel Syndrome Rehab Ask your health care provider which exercises are safe for you. Do exercises exactly as told by your health care provider and adjust them as directed. It is normal to feel mild stretching, pulling, tightness, or discomfort as you do these exercises, but you should stop right away if you feel  sudden pain or your pain gets worse.Do not begin these exercises until told by your health care provider. Strengthening exercises These exercises build strength and endurance in your ankle. Endurance is the ability to use your muscles for a long time, even after they get tired. Exercise A: Eversion 1. Sit on the floor with your legs straight out in front of you. 2. Loop a rubber exercise band around your left / right foot around the ball of your foot. The ball of your foot is on the walking surface, right under your toes. Hold the ends of the band in your hands, or secure the band to a stable object. 3. Slowly push your foot outward, away from your other leg. 4. Hold this position for __________ seconds. 5. Slowly return your foot to the starting position. Repeat __________ times. Complete this exercise __________ times a day. Exercise B: Heel walking (dorsiflexion) Walk on your heels for __________. Keep  your toes as high as possible. Repeat __________ times. Complete this exercise __________ times a day. Balance exercises These exercises improve or maintain your balance. Balance is important in improving ankle stability and preventing falls. Exercise C: Tandem walking Do this exercise in a hallway or room that is at least 10 ft. (3 m) long. 1. Stand with one foot directly in front of the other. You can use the walls to help you balance if needed, but try not to use them for support 2. Slowly lift your back foot and place it directly in front of your other foot. 3. Continue to walk in this heel-to-toe way for __________. Keep your balance without holding on to something for support. Repeat __________ times. Complete this exercise __________ times a day. Exercise D: Single leg stand 1. Without shoes, stand near a railing or in a doorway. You may hold onto the railing or door frame as needed. 2. Stand on your left / right foot. Keep your big toe down on the floor, and try to keep your arch lifted. If this is too easy, try one of these options during the exercise: ? Stand with your eyes closed. ? Stand on a pillow. ? Throw a ball against a wall. 3. Hold this position for __________ seconds. Repeat __________ times. Complete this exercise __________ times a day. Exercise E: Inversion/eversion 1  You will need a balance board for this exercise. Ask your health care provider where you can get a balance board or how you can make one. 1. Stand on a non-carpeted surface near a countertop or wall. 2. Step onto the balance board so your feet are hip-width apart. 3. Keep your feet in place and keep your upper body and hips steady. Using only your feet and ankles, tip the board from side to side as far as you can, alternating between tipping to the left and to the right. If you can, tip the board so it silently taps the floor. Do not let the board forcefully hit the floor. Repeat __________ times, pausing  from time to time to hold a steady position. Complete this exercise __________ times a day. Exercise F: Inversion/eversion 2 You will need a balance board for this exercise. Ask your health care provider where you can get a balance board or how you can make one. 1. Stand on a non-carpeted surface near a countertop or wall. 2. Step onto the balance board so your feet are hip-width apart. 3. Keep your feet in place and keep your upper body and hips steady. Using  only your feet and ankles, tip the board from side to side, alternating between tipping to the left and to the right.Do not let the board hit the floor at all. Repeat __________ times, pausing from time to time to hold a steady position. Complete this exercise __________ times a day. Exercise G: Plantar flexion/dorsiflexion 1  You will need a balance board for this exercise. Ask your health care provider where you can get a balance board or how you can make one. 1. Stand on a non-carpeted surface near a countertop or wall. 2. Step onto the balance board so your feet are hip-width apart. 3. Keep your feet in place and keep your upper body and hips steady. Using only your feet and ankles, tip the board forward and backward so the board silently taps the floor. Do not let the board forcefully hit the floor. Repeat __________ times, pausing from time to time to hold a steady position. Complete this exercise __________ times a day. Exercise H: Plantar flexion/dorsiflexion 2 You will need a balance board for this exercise. Ask your health care provider where you can get a balance board or how you can make one. 1. Stand on a non-carpeted surface near a countertop or wall. 2. Step onto the balance board so your feet are hip-width apart. 3. Keep your feet in place and keep your upper body and hips steady. Using only your feet and ankles, tip the board forward and backward.Do not let the board hit the floor at all. Repeat __________ times, pausing  from time to time to hold a steady position. Complete this exercise __________ times a day. This information is not intended to replace advice given to you by your health care provider. Make sure you discuss any questions you have with your health care provider. Document Released: 09/04/2004 Document Revised: 10/11/2015 Document Reviewed: 12/22/2014 Elsevier Interactive Patient Education  2019 [employer].

## 2018-08-12 LAB — COMPLETE METABOLIC PANEL WITH GFR
AG Ratio: 1.8 (calc) (ref 1.0–2.5)
ALT: 19 U/L (ref 6–29)
AST: 21 U/L (ref 10–35)
Albumin: 4.5 g/dL (ref 3.6–5.1)
Alkaline phosphatase (APISO): 72 U/L (ref 37–153)
BUN: 18 mg/dL (ref 7–25)
CO2: 26 mmol/L (ref 20–32)
Calcium: 9.4 mg/dL (ref 8.6–10.4)
Chloride: 107 mmol/L (ref 98–110)
Creat: 0.62 mg/dL (ref 0.50–0.99)
GFR, Est African American: 108 mL/min/{1.73_m2} (ref >=60)
GFR, Est Non African American: 93 mL/min/{1.73_m2} (ref >=60)
Globulin: 2.5 g/dL (calc) (ref 1.9–3.7)
Glucose, Bld: 84 mg/dL (ref 65–99)
Potassium: 3.7 mmol/L (ref 3.5–5.3)
Sodium: 138 mmol/L (ref 135–146)
Total Bilirubin: 0.3 mg/dL (ref 0.2–1.2)
Total Protein: 7 g/dL (ref 6.1–8.1)

## 2018-08-12 LAB — VITAMIN D 25 HYDROXY (VIT D DEFICIENCY, FRACTURES): Vit D, 25-Hydroxy: 62 ng/mL (ref 30–100)

## 2018-08-12 LAB — URINALYSIS, ROUTINE W REFLEX MICROSCOPIC
Bilirubin Urine: NEGATIVE
Glucose, UA: NEGATIVE
Hgb urine dipstick: NEGATIVE
Ketones, ur: NEGATIVE
Leukocytes,Ua: NEGATIVE
Nitrite: NEGATIVE
Protein, ur: NEGATIVE
Specific Gravity, Urine: 1.006 (ref 1.001–1.03)
pH: 5.5 (ref 5.0–8.0)

## 2018-08-12 LAB — CBC WITH DIFFERENTIAL/PLATELET
Absolute Monocytes: 567 cells/uL (ref 200–950)
Basophils Absolute: 41 cells/uL (ref 0–200)
Basophils Relative: 0.5 %
Eosinophils Absolute: 73 cells/uL (ref 15–500)
Eosinophils Relative: 0.9 %
HCT: 39.6 % (ref 35.0–45.0)
Hemoglobin: 13.2 g/dL (ref 11.7–15.5)
Lymphs Abs: 2009 cells/uL (ref 850–3900)
MCH: 32.5 pg (ref 27.0–33.0)
MCHC: 33.3 g/dL (ref 32.0–36.0)
MCV: 97.5 fL (ref 80.0–100.0)
MPV: 12.1 fL (ref 7.5–12.5)
Monocytes Relative: 7 %
Neutro Abs: 5411 cells/uL (ref 1500–7800)
Neutrophils Relative %: 66.8 %
Platelets: 228 10*3/uL (ref 140–400)
RBC: 4.06 10*6/uL (ref 3.80–5.10)
RDW: 12.1 % (ref 11.0–15.0)
Total Lymphocyte: 24.8 %
WBC: 8.1 10*3/uL (ref 3.8–10.8)

## 2018-08-12 LAB — LIPID PANEL
Cholesterol: 241 mg/dL — ABNORMAL HIGH (ref <200)
HDL: 91 mg/dL (ref >=50)
LDL Cholesterol (Calc): 128 mg/dL (calc) — ABNORMAL HIGH
Non-HDL Cholesterol (Calc): 150 mg/dL (calc) — ABNORMAL HIGH (ref <130)
Total CHOL/HDL Ratio: 2.6 (calc) (ref <5.0)
Triglycerides: 108 mg/dL (ref <150)

## 2018-08-12 LAB — MICROALBUMIN / CREATININE URINE RATIO
Creatinine, Urine: 15 mg/dL — ABNORMAL LOW (ref 20–275)
Microalb, Ur: <0.2 mg/dL

## 2018-08-12 LAB — TSH: TSH: 1.54 mIU/L (ref 0.40–4.50)

## 2018-08-12 LAB — MAGNESIUM: Magnesium: 2 mg/dL (ref 1.5–2.5)

## 2018-08-24 ENCOUNTER — Other Ambulatory Visit: Payer: Self-pay | Admitting: Neurological Surgery

## 2018-08-24 DIAGNOSIS — M5441 Lumbago with sciatica, right side: Secondary | ICD-10-CM

## 2018-08-24 DIAGNOSIS — M5442 Lumbago with sciatica, left side: Secondary | ICD-10-CM

## 2018-09-14 ENCOUNTER — Ambulatory Visit
Admission: RE | Admit: 2018-09-14 | Discharge: 2018-09-14 | Disposition: A | Discharge status: Home / Self Care | Payer: Medicare Other | Source: Ambulatory Visit | Attending: Neurological Surgery | Admitting: Neurological Surgery

## 2018-09-14 ENCOUNTER — Other Ambulatory Visit: Payer: Self-pay

## 2018-09-14 DIAGNOSIS — M48061 Spinal stenosis, lumbar region without neurogenic claudication: Secondary | ICD-10-CM | POA: Diagnosis not present

## 2018-09-14 DIAGNOSIS — M5441 Lumbago with sciatica, right side: Secondary | ICD-10-CM

## 2018-10-22 DIAGNOSIS — Z86010 Personal history of colonic polyps: Secondary | ICD-10-CM | POA: Diagnosis not present

## 2018-10-22 DIAGNOSIS — R194 Change in bowel habit: Secondary | ICD-10-CM | POA: Diagnosis not present

## 2018-10-27 DIAGNOSIS — R194 Change in bowel habit: Secondary | ICD-10-CM | POA: Diagnosis not present

## 2018-11-15 DIAGNOSIS — S83241A Other tear of medial meniscus, current injury, right knee, initial encounter: Secondary | ICD-10-CM | POA: Diagnosis not present

## 2018-11-15 DIAGNOSIS — M2021 Hallux rigidus, right foot: Secondary | ICD-10-CM | POA: Diagnosis not present

## 2018-11-21 ENCOUNTER — Other Ambulatory Visit: Payer: Self-pay | Admitting: Internal Medicine

## 2018-11-21 DIAGNOSIS — K219 Gastro-esophageal reflux disease without esophagitis: Secondary | ICD-10-CM

## 2018-11-26 DIAGNOSIS — M25561 Pain in right knee: Secondary | ICD-10-CM | POA: Diagnosis not present

## 2018-11-26 DIAGNOSIS — M1711 Unilateral primary osteoarthritis, right knee: Secondary | ICD-10-CM | POA: Diagnosis not present

## 2018-11-26 DIAGNOSIS — M48061 Spinal stenosis, lumbar region without neurogenic claudication: Secondary | ICD-10-CM | POA: Diagnosis not present

## 2018-11-26 DIAGNOSIS — M5136 Other intervertebral disc degeneration, lumbar region: Secondary | ICD-10-CM | POA: Diagnosis not present

## 2018-11-26 DIAGNOSIS — M545 Low back pain: Secondary | ICD-10-CM | POA: Diagnosis not present

## 2018-12-04 DIAGNOSIS — M25561 Pain in right knee: Secondary | ICD-10-CM | POA: Diagnosis not present

## 2018-12-07 DIAGNOSIS — L718 Other rosacea: Secondary | ICD-10-CM | POA: Diagnosis not present

## 2018-12-07 DIAGNOSIS — L821 Other seborrheic keratosis: Secondary | ICD-10-CM | POA: Diagnosis not present

## 2018-12-07 DIAGNOSIS — L438 Other lichen planus: Secondary | ICD-10-CM | POA: Diagnosis not present

## 2018-12-07 DIAGNOSIS — D1801 Hemangioma of skin and subcutaneous tissue: Secondary | ICD-10-CM | POA: Diagnosis not present

## 2018-12-07 DIAGNOSIS — M25561 Pain in right knee: Secondary | ICD-10-CM | POA: Diagnosis not present

## 2018-12-07 DIAGNOSIS — S83231D Complex tear of medial meniscus, current injury, right knee, subsequent encounter: Secondary | ICD-10-CM | POA: Diagnosis not present

## 2018-12-07 DIAGNOSIS — M25562 Pain in left knee: Secondary | ICD-10-CM | POA: Diagnosis not present

## 2018-12-07 DIAGNOSIS — L814 Other melanin hyperpigmentation: Secondary | ICD-10-CM | POA: Diagnosis not present

## 2018-12-07 DIAGNOSIS — D225 Melanocytic nevi of trunk: Secondary | ICD-10-CM | POA: Diagnosis not present

## 2018-12-18 DIAGNOSIS — M5136 Other intervertebral disc degeneration, lumbar region: Secondary | ICD-10-CM | POA: Diagnosis not present

## 2018-12-21 DIAGNOSIS — D3001 Benign neoplasm of right kidney: Secondary | ICD-10-CM | POA: Diagnosis not present

## 2018-12-21 DIAGNOSIS — N3281 Overactive bladder: Secondary | ICD-10-CM | POA: Diagnosis not present

## 2018-12-24 DIAGNOSIS — M2021 Hallux rigidus, right foot: Secondary | ICD-10-CM | POA: Diagnosis not present

## 2019-01-05 DIAGNOSIS — M25562 Pain in left knee: Secondary | ICD-10-CM | POA: Diagnosis not present

## 2019-01-12 ENCOUNTER — Ambulatory Visit: Payer: Self-pay | Admitting: Physician Assistant

## 2019-01-18 ENCOUNTER — Ambulatory Visit: Payer: Self-pay | Admitting: Physician Assistant

## 2019-01-19 DIAGNOSIS — M25562 Pain in left knee: Secondary | ICD-10-CM | POA: Diagnosis not present

## 2019-01-25 DIAGNOSIS — M1712 Unilateral primary osteoarthritis, left knee: Secondary | ICD-10-CM | POA: Diagnosis not present

## 2019-01-25 DIAGNOSIS — M25562 Pain in left knee: Secondary | ICD-10-CM | POA: Diagnosis not present

## 2019-02-01 DIAGNOSIS — M532X1 Spinal instabilities, occipito-atlanto-axial region: Secondary | ICD-10-CM | POA: Diagnosis not present

## 2019-02-01 DIAGNOSIS — M5416 Radiculopathy, lumbar region: Secondary | ICD-10-CM | POA: Diagnosis not present

## 2019-02-01 DIAGNOSIS — Z9889 Other specified postprocedural states: Secondary | ICD-10-CM | POA: Diagnosis not present

## 2019-02-01 DIAGNOSIS — M5116 Intervertebral disc disorders with radiculopathy, lumbar region: Secondary | ICD-10-CM | POA: Diagnosis not present

## 2019-02-01 DIAGNOSIS — M5441 Lumbago with sciatica, right side: Secondary | ICD-10-CM | POA: Diagnosis not present

## 2019-02-09 DIAGNOSIS — M48061 Spinal stenosis, lumbar region without neurogenic claudication: Secondary | ICD-10-CM | POA: Diagnosis not present

## 2019-02-09 DIAGNOSIS — R030 Elevated blood-pressure reading, without diagnosis of hypertension: Secondary | ICD-10-CM | POA: Diagnosis not present

## 2019-02-09 DIAGNOSIS — M5416 Radiculopathy, lumbar region: Secondary | ICD-10-CM | POA: Diagnosis not present

## 2019-03-02 DIAGNOSIS — M5416 Radiculopathy, lumbar region: Secondary | ICD-10-CM | POA: Diagnosis not present

## 2019-03-02 DIAGNOSIS — M48061 Spinal stenosis, lumbar region without neurogenic claudication: Secondary | ICD-10-CM | POA: Diagnosis not present

## 2019-03-08 ENCOUNTER — Ambulatory Visit: Payer: PPO | Attending: Internal Medicine

## 2019-03-08 DIAGNOSIS — Z23 Encounter for immunization: Secondary | ICD-10-CM | POA: Insufficient documentation

## 2019-03-08 DIAGNOSIS — M5416 Radiculopathy, lumbar region: Secondary | ICD-10-CM | POA: Diagnosis not present

## 2019-03-08 NOTE — Progress Notes (Signed)
   Covid-19 Vaccination Clinic  Name:  Helen Glover    MRN: [id number] DOB: 1950-06-16  03/08/2019  [name] was observed post Covid-19 immunization for 15 minutes without incidence. She was provided with Vaccine Information Sheet and instruction to access the V-Safe system.   [name] was instructed to call 911 with any severe reactions post vaccine: Marland Kitchen Difficulty breathing  . Swelling of your face and throat  . A fast heartbeat  . A bad rash all over your body  . Dizziness and weakness    Immunizations Administered    Name Date Dose VIS Date Route   Pfizer COVID-19 Vaccine 03/08/2019  5:22 PM 0.3 mL 01/28/2019 Intramuscular   Manufacturer: [place]   Lot: [id number]   [place]: [id number]

## 2019-03-26 ENCOUNTER — Ambulatory Visit: Payer: PPO | Attending: Internal Medicine

## 2019-03-26 DIAGNOSIS — Z23 Encounter for immunization: Secondary | ICD-10-CM

## 2019-03-26 NOTE — Progress Notes (Signed)
   Covid-19 Vaccination Clinic  Name:  Helen Glover    MRN: [id number] DOB: 10/28/1950  03/26/2019  [name] was observed post Covid-19 immunization for 15 minutes without incidence. She was provided with Vaccine Information Sheet and instruction to access the V-Safe system.   [name] was instructed to call 911 with any severe reactions post vaccine: Marland Kitchen Difficulty breathing  . Swelling of your face and throat  . A fast heartbeat  . A bad rash all over your body  . Dizziness and weakness    Immunizations Administered    Name Date Dose VIS Date Route   Pfizer COVID-19 Vaccine 03/26/2019 12:34 PM 0.3 mL 01/28/2019 Intramuscular   Manufacturer: [place]   Lot: [id number]   [place]: [id number]

## 2019-03-28 ENCOUNTER — Ambulatory Visit: Payer: PPO

## 2019-04-05 DIAGNOSIS — M5416 Radiculopathy, lumbar region: Secondary | ICD-10-CM | POA: Diagnosis not present

## 2019-05-01 ENCOUNTER — Other Ambulatory Visit: Payer: Self-pay | Admitting: Obstetrics & Gynecology

## 2019-05-03 NOTE — Telephone Encounter (Signed)
Medication refill request: vivelle dot 0.025mg  Last AEX:  04-23-2018 Next AEX: 09-13-2019 Last MMG (if hormonal medication request):  02-01-18 birads 1:neg, scheduled for 06-20-2019 per [facility] Refill authorized: please approve if appropriate

## 2019-05-04 ENCOUNTER — Encounter: Payer: Self-pay | Admitting: Obstetrics & Gynecology

## 2019-05-05 DIAGNOSIS — M23204 Derangement of unspecified medial meniscus due to old tear or injury, left knee: Secondary | ICD-10-CM | POA: Diagnosis not present

## 2019-05-05 DIAGNOSIS — S83231D Complex tear of medial meniscus, current injury, right knee, subsequent encounter: Secondary | ICD-10-CM | POA: Diagnosis not present

## 2019-05-05 DIAGNOSIS — M25561 Pain in right knee: Secondary | ICD-10-CM | POA: Diagnosis not present

## 2019-05-23 DIAGNOSIS — M25561 Pain in right knee: Secondary | ICD-10-CM | POA: Diagnosis not present

## 2019-05-31 DIAGNOSIS — H6121 Impacted cerumen, right ear: Secondary | ICD-10-CM | POA: Diagnosis not present

## 2019-05-31 DIAGNOSIS — M25561 Pain in right knee: Secondary | ICD-10-CM | POA: Diagnosis not present

## 2019-05-31 DIAGNOSIS — H903 Sensorineural hearing loss, bilateral: Secondary | ICD-10-CM | POA: Diagnosis not present

## 2019-05-31 DIAGNOSIS — H7203 Central perforation of tympanic membrane, bilateral: Secondary | ICD-10-CM | POA: Diagnosis not present

## 2019-05-31 DIAGNOSIS — S83231D Complex tear of medial meniscus, current injury, right knee, subsequent encounter: Secondary | ICD-10-CM | POA: Diagnosis not present

## 2019-06-03 DIAGNOSIS — Z86010 Personal history of colonic polyps: Secondary | ICD-10-CM | POA: Diagnosis not present

## 2019-06-03 DIAGNOSIS — R197 Diarrhea, unspecified: Secondary | ICD-10-CM | POA: Diagnosis not present

## 2019-06-06 ENCOUNTER — Other Ambulatory Visit: Payer: Self-pay | Admitting: Adult Health

## 2019-06-06 ENCOUNTER — Other Ambulatory Visit: Payer: Self-pay

## 2019-06-06 ENCOUNTER — Ambulatory Visit (INDEPENDENT_AMBULATORY_CARE_PROVIDER_SITE_OTHER): Payer: PPO | Admitting: *Deleted

## 2019-06-06 DIAGNOSIS — Z8639 Personal history of other endocrine, nutritional and metabolic disease: Secondary | ICD-10-CM

## 2019-06-06 DIAGNOSIS — R5383 Other fatigue: Secondary | ICD-10-CM

## 2019-06-06 DIAGNOSIS — E538 Deficiency of other specified B group vitamins: Secondary | ICD-10-CM

## 2019-06-06 LAB — CBC WITH DIFFERENTIAL/PLATELET
Absolute Monocytes: 624 cells/uL (ref 200–950)
Basophils Absolute: 12 cells/uL (ref 0–200)
Basophils Relative: 0.2 %
Eosinophils Absolute: 90 cells/uL (ref 15–500)
Eosinophils Relative: 1.5 %
HCT: 41.9 % (ref 35.0–45.0)
Hemoglobin: 13.9 g/dL (ref 11.7–15.5)
Lymphs Abs: 1284 cells/uL (ref 850–3900)
MCH: 32 pg (ref 27.0–33.0)
MCHC: 33.2 g/dL (ref 32.0–36.0)
MCV: 96.3 fL (ref 80.0–100.0)
MPV: 11.8 fL (ref 7.5–12.5)
Monocytes Relative: 10.4 %
Neutro Abs: 3990 cells/uL (ref 1500–7800)
Neutrophils Relative %: 66.5 %
Platelets: 244 10*3/uL (ref 140–400)
RBC: 4.35 10*6/uL (ref 3.80–5.10)
RDW: 12.4 % (ref 11.0–15.0)
Total Lymphocyte: 21.4 %
WBC: 6 10*3/uL (ref 3.8–10.8)

## 2019-06-06 LAB — COMPLETE METABOLIC PANEL WITH GFR
AG Ratio: 1.7 (calc) (ref 1.0–2.5)
ALT: 19 U/L (ref 6–29)
AST: 23 U/L (ref 10–35)
Albumin: 4.2 g/dL (ref 3.6–5.1)
Alkaline phosphatase (APISO): 68 U/L (ref 37–153)
BUN: 12 mg/dL (ref 7–25)
CO2: 30 mmol/L (ref 20–32)
Calcium: 9.4 mg/dL (ref 8.6–10.4)
Chloride: 103 mmol/L (ref 98–110)
Creat: 0.57 mg/dL (ref 0.50–0.99)
GFR, Est African American: 110 mL/min/{1.73_m2} (ref >=60)
GFR, Est Non African American: 95 mL/min/{1.73_m2} (ref >=60)
Globulin: 2.5 g/dL (calc) (ref 1.9–3.7)
Glucose, Bld: 91 mg/dL (ref 65–99)
Potassium: 4.2 mmol/L (ref 3.5–5.3)
Sodium: 141 mmol/L (ref 135–146)
Total Bilirubin: 0.3 mg/dL (ref 0.2–1.2)
Total Protein: 6.7 g/dL (ref 6.1–8.1)

## 2019-06-06 LAB — VITAMIN B12: Vitamin B-12: 938 pg/mL (ref 200–1100)

## 2019-06-06 LAB — TSH: TSH: 1.58 mIU/L (ref 0.40–4.50)

## 2019-06-06 NOTE — Progress Notes (Signed)
Patient is here for a NV to check labs for anemia and for upcoming surgery of the knee.

## 2019-06-11 DIAGNOSIS — M25561 Pain in right knee: Secondary | ICD-10-CM | POA: Diagnosis not present

## 2019-06-20 ENCOUNTER — Telehealth: Payer: Self-pay | Admitting: *Deleted

## 2019-06-20 NOTE — Telephone Encounter (Signed)
Last physical from 07/2018 and last labs faxed to [facility].

## 2019-06-22 DIAGNOSIS — S83231A Complex tear of medial meniscus, current injury, right knee, initial encounter: Secondary | ICD-10-CM | POA: Diagnosis not present

## 2019-06-22 DIAGNOSIS — M94261 Chondromalacia, right knee: Secondary | ICD-10-CM | POA: Diagnosis not present

## 2019-06-22 DIAGNOSIS — Y999 Unspecified external cause status: Secondary | ICD-10-CM | POA: Diagnosis not present

## 2019-06-22 DIAGNOSIS — X58XXXA Exposure to other specified factors, initial encounter: Secondary | ICD-10-CM | POA: Diagnosis not present

## 2019-06-22 HISTORY — PX: KNEE ARTHROSCOPY: SUR90

## 2019-07-07 DIAGNOSIS — I700 Atherosclerosis of aorta: Secondary | ICD-10-CM | POA: Insufficient documentation

## 2019-07-07 NOTE — Progress Notes (Signed)
MEDICARE ANNUAL WELLNESS VISIT AND FOLLOW UP Assessment:   Diagnoses and all orders for this visit:  Medicare annual wellness visit, subsequent Has mammogram scheduled in June, requested forward report Has follow up with Dr. Sabra Heck - ask about DEXA, due  Essential hypertension No medications at this time Monitor blood pressure  Continue DASH diet.   Reminder to go to the ER if any CP, SOB, nausea, dizziness, severe HA, changes vision/speech, left arm numbness and tingling and jaw pain.  Gastroesophageal reflux disease, esophagitis presence not specified Taking omeprazole in AM and reports well controlled Discussed diet, avoiding triggers and other lifestyle changes  Hx of prediabetes A1Cs well controlled in recent years Dicussed dietary and exercise modifications Check A1C annually, check CMP for glucose  Hyperlipidemia, mixed Moderate elevations trying lifestyle changes Discussed dietary and exercise modifications Check lipids, plan to initiate med if LDL remains significantly elevated near 130  Medication management CMP   Interstitial cystitis  Reports improved since retired; declines meds at this time, managing with lifestyle, follows urology   I provided 30 minutes of non-face-to-face time during this encounter including counseling, chart review, and critical decision making was preformed.   Future Appointments  Date Time Provider [place]  09/13/2019  2:00 PM Megan Salon, MD [place] None  10/31/2019 10:00 AM Vicie Mutters, PA-C [facility] None      Plan:   During the course of the visit the patient was educated and counseled about appropriate screening and preventive services including:    Pneumococcal vaccine   Influenza vaccine  Prevnar 13  Td vaccine  Screening electrocardiogram, deferred, telephone visit [place].  Colorectal cancer screening  Diabetes screening  Glaucoma screening  Nutrition counseling    Subjective:   Helen Glover is a 70 y.o. female who presents for Medicare Annual Wellness Visit and follow up for HTN, hyperlipidemia, prediabetes, GERD and vitamin D Def.   02/25/18 patient had C1-C2 posterior laminectomy and fusion by Dr Matthew Saras, doing well since.  She has spinal stenosis with radiculopathy followed by Dr. Brien Few, getting injections with immense benefit. Follows with Dr. Veverly Fells for knee, recently had R knee arthroscopy 06/22/2019, doing fairly, but reports possible bursitis vs stress fracture and continues to use crutches.  Adhesive capsulitis of shoulder with rotator cuff arthropathy, monitoring only for now.   Reported fatigue, on 4/19 had labs - normal CBC, CMP, TSH, B12, reports since that time symptoms have improved, she wonders if ortho surgeries and boredom may have contributed. Also following with counselor for mood due to feeling down somewhat since pandemic.   She is taking omeprazole 20mg  in the morning.  She is also taking a probiotic tablet daily.  She had hysterectomy and continues on low dose estradiol patch, follows with GYN.   Interstitial cystitis - patient states mild now that lower stress since retired, managing with lifestyle only.   BMI is Body mass index is 24.04 kg/m., she has been working on diet, exercises limited by ortho conditions, starting PT soon.  Wt Readings from Last 3 Encounters:  07/08/19 119 lb (54 kg)  06/06/19 121 lb 12.8 oz (55.2 kg)  08/11/18 118 lb 9.6 oz (53.8 kg)   She does not check BP at home, not on any meds, today their BP is BP: 114/68  She does not workout. She denies chest pain, shortness of breath, dizziness.  She is not on cholesterol medication and denies myalgias. Her cholesterol is not at goal. The cholesterol last visit was:   Lab  Results  Component Value Date   CHOL 241 (H) 08/11/2018   HDL 91 08/11/2018   LDLCALC 128 (H) 08/11/2018   TRIG 108 08/11/2018   CHOLHDL 2.6 08/11/2018   She has been working on diet and exercise  for hx of prediabetes (A1C 5.7% in 2014), and denies hyperglycemia, hypoglycemia , increased appetite, nausea, paresthesia of the feet, polydipsia, polyuria, visual disturbances, vomiting and weight loss. Last A1C in the office was:  Lab Results  Component Value Date   HGBA1C 5.4 01/04/2018   Last GFR Lab Results  Component Value Date   GFRNONAA 95 06/06/2019   Patient is on Vitamin D supplement.   Lab Results  Component Value Date   VD25OH 62 08/11/2018      Medication Review:  Current Outpatient Medications (Endocrine & Metabolic):  .  estradiol (VIVELLE-DOT) 0.025 MG/24HR, PLACE 1 PATCH ONTO THE SKIN 2 TIMES A WEEK     Current Outpatient Medications (Hematological):  .  vitamin B-12 (CYANOCOBALAMIN) 100 MCG tablet, Take 100 mcg by mouth daily.  Current Outpatient Medications (Other):  Marland Kitchen  Ascorbic Acid (VITAMIN C) 100 MG tablet, Take 100 mg by mouth daily. .  Biotin 1 MG CAPS, Take 1 capsule by mouth daily.  .  calcium carbonate (OS-CAL - DOSED IN MG OF ELEMENTAL CALCIUM) 1250 (500 Ca) MG tablet, Take 1 tablet by mouth. .  cholecalciferol (VITAMIN D) 1000 UNITS tablet, Take 3,000 Units by mouth daily.  Marland Kitchen  omeprazole (PRILOSEC) 20 MG capsule, Take 1 capsule 1 or 2 x /day before Meals to Prevent Heartburn & Indigestion .  Probiotic Product (PROBIOTIC PO), Take by mouth daily. Marland Kitchen  pyridOXINE (VITAMIN B-6) 100 MG tablet, Take 100 mg by mouth daily.  Allergies: Allergies  Allergen Reactions  . Other     Band-aid  . Codeine Hives  . Sulfa Antibiotics Nausea And Vomiting    Current Problems (verified) has Interstitial cystitis; GERD (gastroesophageal reflux disease); Hyperlipidemia; Hypertension; History of prediabetes; Hearing loss of both ears; Adhesive capsulitis of right shoulder; and Aortic atherosclerosis (HCC) on their problem list.  Screening Tests Health Maintenance  Topic Date Due  . MAMMOGRAM  02/02/2019  . URINE MICROALBUMIN  08/11/2019  . INFLUENZA VACCINE   09/18/2019  . TETANUS/TDAP  09/23/2020  . COLONOSCOPY  07/14/2023  . DEXA SCAN  Completed  . COVID-19 Vaccine  Completed  . Hepatitis C Screening  Completed  . PNA vac Low Risk Adult  Completed   Last colonoscopy: 2020 Dr. Amedeo Plenty due 5 years Last mammogram: 01/2018, has scheduled in June 21, [facility] Last pap smear/pelvic exam: 06/27/2013 Dr. Sabra Heck, DONE paps, TAH, pelvic only DEXA: 2018 Dr. Sabra Heck, DUE AB [place] 10/2016 CT AB 07/2018 - aortic atherosclerosis    Prior vaccinations: TD or Tdap: 2012  Influenza: 10/2018  Pneumococcal: 12/25/2016 Prevnar13: 2015 Shingles/Zostavax: 2007 Shingrix 2018, reports [facility] Covid 19: 2/2, 2021, pfizer   Names of Other Physician/Practitioners you currently use: 1. [facility] here for primary care 2. Donato Heinz, eye doctor, last visit 2020, wears glasses, scheduled in June 2021 3. Dr. Toy Cookey, dentist, last visit 2021  Patient Care Team: Unk Pinto, MD as PCP - General (Internal Medicine) Barbaraann Cao, OD as Referring Physician (Optometry) Teena Irani, MD (Inactive) as Consulting Physician (Gastroenterology) Marlou Sa, Tonna Corner, MD as Consulting Physician (Orthopedic Surgery) Berenice Primas, MD as Referring Physician (Orthopedic Surgery) Megan Salon, MD as Consulting Physician (Gynecology) Ardis Hughs, MD as Attending Physician (  Urology) Leta Baptist, MD as Consulting Physician (Otolaryngology)  Surgical: She  has a past surgical history that includes Cesarean section; Myringotomy; Lumbar laminectomy; Carpal tunnel release; Trigger finger release; Anal sphincterotomy; Nasal sinus surgery; Abdominal hysterectomy (2003); Hemorrhoid surgery (N/A, 1998); Cystoscopy; Tympanostomy tube placement; Tonsillectomy; Tonsillectomy (10/14); Lumbar laminectomy/decompression microdiscectomy (N/A, 08/09/2014); Cervical fusion (02/25/2018); and Knee arthroscopy (Right, 06/22/2019). Family Her  family history includes Diabetes in her mother; Hyperlipidemia in her brother and mother; Hypertension in her brother and mother; Osteoporosis in her mother; Prostate cancer in her father. Social history  She reports that she quit smoking about 32 years ago. She has a 7.50 pack-year smoking history. She has never used smokeless tobacco. She reports current alcohol use of about 6.0 - 7.0 standard drinks of alcohol per week. She reports that she does not use drugs.  MEDICARE WELLNESS OBJECTIVES: Physical activity: Current Exercise Habits: The patient does not participate in regular exercise at present(will be starting PT soon), Exercise limited by: orthopedic condition(s) Cardiac risk factors: Cardiac Risk Factors include: advanced age (>74men, >79 women);dyslipidemia;sedentary lifestyle Depression/mood screen:   Depression screen [facility] 2/9 07/08/2019  Decreased Interest 0  Down, Depressed, Hopeless 1  PHQ - 2 Score 1    ADLs:  In your present state of health, do you have any difficulty performing the following activities: 07/08/2019  Hearing? N  Comment has hearing aids  Vision? N  Difficulty concentrating or making decisions? N  Walking or climbing stairs? Y  Comment recent surgery, will be starting PT  Dressing or bathing? N  Doing errands, shopping? N  Some recent data might be hidden     Cognitive Testing  Alert? Yes  Normal Appearance?Yes  Oriented to person? Yes  Place? Yes   Time? Yes  Recall of three objects?  Yes  Can perform simple calculations? Yes  Displays appropriate judgment?Yes  Can read the correct time from a watch face?Yes  EOL planning: Does Patient Have a Medical Advance Directive?: Yes Type of Advance Directive: Healthcare Power of Attorney, Living will Does patient want to make changes to medical advance directive?: No - Patient declined Copy of [place] in Chart?: No - copy requested   Objective:   Today's Vitals   07/08/19 1024  BP:  114/68  Pulse: 79  Temp: (!) 97.1 F (36.2 C)  SpO2: 97%  Weight: 119 lb (54 kg)  Height: 4\' 11"  (1.499 m)  PainSc: 2   PainLoc: Knee   Body mass index is 24.04 kg/m.  General appearance: alert, no distress, WD/WN, female HEENT: normocephalic, sclerae anicteric, right TM with tube, left TM with perforation, nares patent, no discharge or erythema, pharynx normal Oral cavity: MMM, no lesions Neck: supple, no lymphadenopathy, no thyromegaly, no masses Heart: RRR, normal S1, S2, no murmurs Lungs: CTA bilaterally, no wheezes, rhonchi, or rales Abdomen: +bs, soft, non tender, non distended, no masses, no hepatomegaly, no splenomegaly Musculoskeletal: nontender, no swelling, no obvious deformity Extremities: no edema, no cyanosis, no clubbing Pulses: 2+ symmetric, upper and lower extremities, normal cap refill Neurological: alert, oriented x 3, CN2-12 intact, strength normal upper extremities and lower extremities, sensation normal throughout, DTRs 2+ throughout, no cerebellar signs, gait normal Psychiatric: normal affect, behavior normal, pleasant   Medicare Attestation I have personally reviewed: The patient's medical and social history Their use of alcohol, tobacco or illicit drugs Their current medications and supplements The patient's functional ability including ADLs,fall risks, home safety risks, cognitive, and hearing and visual impairment Diet and  physical activities Evidence for depression or mood disorders  The patient's weight, height, BMI, and visual acuity have been recorded in the chart.  I have made referrals, counseling, and provided education to the patient based on review of the above and I have provided the patient with a written personalized care plan for preventive services.     [name], NP 12:03 PM [facility]

## 2019-07-08 ENCOUNTER — Encounter: Payer: Self-pay | Admitting: Adult Health

## 2019-07-08 ENCOUNTER — Encounter: Payer: Self-pay | Admitting: Obstetrics & Gynecology

## 2019-07-08 ENCOUNTER — Other Ambulatory Visit: Payer: Self-pay

## 2019-07-08 ENCOUNTER — Ambulatory Visit (INDEPENDENT_AMBULATORY_CARE_PROVIDER_SITE_OTHER): Payer: PPO | Admitting: Adult Health

## 2019-07-08 ENCOUNTER — Telehealth: Payer: Self-pay | Admitting: Obstetrics & Gynecology

## 2019-07-08 ENCOUNTER — Ambulatory Visit: Payer: PPO | Admitting: Adult Health Nurse Practitioner

## 2019-07-08 VITALS — BP 114/68 | HR 79 | Temp 97.1°F | Ht 59.0 in | Wt 119.0 lb

## 2019-07-08 DIAGNOSIS — Z87898 Personal history of other specified conditions: Secondary | ICD-10-CM | POA: Diagnosis not present

## 2019-07-08 DIAGNOSIS — E785 Hyperlipidemia, unspecified: Secondary | ICD-10-CM

## 2019-07-08 DIAGNOSIS — K219 Gastro-esophageal reflux disease without esophagitis: Secondary | ICD-10-CM | POA: Diagnosis not present

## 2019-07-08 DIAGNOSIS — R6889 Other general symptoms and signs: Secondary | ICD-10-CM | POA: Diagnosis not present

## 2019-07-08 DIAGNOSIS — Z0001 Encounter for general adult medical examination with abnormal findings: Secondary | ICD-10-CM | POA: Diagnosis not present

## 2019-07-08 DIAGNOSIS — H9193 Unspecified hearing loss, bilateral: Secondary | ICD-10-CM

## 2019-07-08 DIAGNOSIS — I700 Atherosclerosis of aorta: Secondary | ICD-10-CM

## 2019-07-08 DIAGNOSIS — I10 Essential (primary) hypertension: Secondary | ICD-10-CM

## 2019-07-08 DIAGNOSIS — Z0000 Encounter for general adult medical examination without abnormal findings: Secondary | ICD-10-CM

## 2019-07-08 DIAGNOSIS — N3010 Interstitial cystitis (chronic) without hematuria: Secondary | ICD-10-CM

## 2019-07-08 NOTE — Telephone Encounter (Signed)
Advance Care Planning question Received: Today Message Contents  Neth[place], Mes[place]" sent to [place]  Phone Number: [phone]  Hi...I saw Liane Comber today for my Medicare wellness exam and she was wondering if I should get a bone density test prior to my next visit with you. She thinks it may be due.Marland KitchenMarland KitchenI don't see you for awhile...   Thanks,  Marliss Coots

## 2019-07-08 NOTE — Patient Instructions (Addendum)
Helen Glover , Thank you for taking time to come for your Medicare Wellness Visit. I appreciate your ongoing commitment to your health goals. Please review the following plan we discussed and let me know if I can assist you in the future.   These are the goals we discussed: Goals    . LDL CALC < 100       This is a list of the screening recommended for you and due dates:  Health Maintenance  Topic Date Due  . Mammogram  02/02/2019  . Urine Protein Check  08/11/2019  . Flu Shot  09/18/2019  . Tetanus Vaccine  09/23/2020  . Colon Cancer Screening  07/14/2023  . DEXA scan (bone density measurement)  Completed  . COVID-19 Vaccine  Completed  .  Hepatitis C: One time screening is recommended by Center for Disease Control  (CDC) for  adults born from [birth year] through 1964.   Completed  . Pneumonia vaccines  Completed    Please ask Dr. Sabra Heck about bone density exam  Please have mammogram report forwarded to [place]  Try to limit oils, fast/processed foods, animal fats  Focus on high fiber diet - fruits, veggies, nuts, seeds, whole grains, beans   High-Fiber Diet Fiber, also called dietary fiber, is a type of carbohydrate that is found in fruits, vegetables, whole grains, and beans. A high-fiber diet can have many health benefits. Your health care provider may recommend a high-fiber diet to help:  Prevent constipation. Fiber can make your bowel movements more regular.  Lower your cholesterol.  Relieve the following conditions: ? Swelling of veins in the anus (hemorrhoids). ? Swelling and irritation (inflammation) of specific areas of the digestive tract (uncomplicated diverticulosis). ? A problem of the large intestine (colon) that sometimes causes pain and diarrhea (irritable bowel syndrome, IBS).  Prevent overeating as part of a weight-loss plan.  Prevent heart disease, type 2 diabetes, and certain cancers. What is my plan? The recommended daily fiber intake in grams (g)  includes:  38 g for men age 25 or younger.  30 g for men over age 45.  64 g for women age 20 or younger.  21 g for women over age 77. You can get the recommended daily intake of dietary fiber by:  Eating a variety of fruits, vegetables, grains, and beans.  Taking a fiber supplement, if it is not possible to get enough fiber through your diet. What do I need to know about a high-fiber diet?  It is better to get fiber through food sources rather than from fiber supplements. There is not a lot of research about how effective supplements are.  Always check the fiber content on the nutrition facts label of any prepackaged food. Look for foods that contain 5 g of fiber or more per serving.  Talk with a diet and nutrition specialist (dietitian) if you have questions about specific foods that are recommended or not recommended for your medical condition, especially if those foods are not listed below.  Gradually increase how much fiber you consume. If you increase your intake of dietary fiber too quickly, you may have bloating, cramping, or gas.  Drink plenty of water. Water helps you to digest fiber. What are tips for following this plan?  Eat a wide variety of high-fiber foods.  Make sure that half of the grains that you eat each day are whole grains.  Eat breads and cereals that are made with whole-grain flour instead of refined flour or white flour.  Eat brown rice, bulgur wheat, or millet instead of white rice.  Start the day with a breakfast that is high in fiber, such as a cereal that contains 5 g of fiber or more per serving.  Use beans in place of meat in soups, salads, and pasta dishes.  Eat high-fiber snacks, such as berries, raw vegetables, nuts, and popcorn.  Choose whole fruits and vegetables instead of processed forms like juice or sauce. What foods can I eat?  Fruits Berries. Pears. Apples. Oranges. Avocado. Prunes and raisins. Dried figs. Vegetables Sweet  potatoes. Spinach. Kale. Artichokes. Cabbage. Broccoli. Cauliflower. Green peas. Carrots. Squash. Grains Whole-grain breads. Multigrain cereal. Oats and oatmeal. Brown rice. Barley. Bulgur wheat. [place]. Quinoa. Bran muffins. Popcorn. Rye wafer crackers. Meats and other proteins Navy, kidney, and pinto beans. Soybeans. Split peas. Lentils. Nuts and seeds. Dairy Fiber-fortified yogurt. Beverages Fiber-fortified soy milk. Fiber-fortified orange juice. Other foods Fiber bars. The items listed above may not be a complete list of recommended foods and beverages. Contact a dietitian for more options. What foods are not recommended? Fruits Fruit juice. Cooked, strained fruit. Vegetables Fried potatoes. Canned vegetables. Well-cooked vegetables. Grains White bread. Pasta made with refined flour. White rice. Meats and other proteins Fatty cuts of meat. Fried chicken or fried fish. Dairy Milk. Yogurt. Cream cheese. Sour cream. Fats and oils Butters. Beverages Soft drinks. Other foods Cakes and pastries. The items listed above may not be a complete list of foods and beverages to avoid. Contact a dietitian for more information. Summary  Fiber is a type of carbohydrate. It is found in fruits, vegetables, whole grains, and beans.  There are many health benefits of eating a high-fiber diet, such as preventing constipation, lowering blood cholesterol, helping with weight loss, and reducing your risk of heart disease, diabetes, and certain cancers.  Gradually increase your intake of fiber. Increasing too fast can result in cramping, bloating, and gas. Drink plenty of water while you increase your fiber.  The best sources of fiber include whole fruits and vegetables, whole grains, nuts, seeds, and beans. This information is not intended to replace advice given to you by your health care provider. Make sure you discuss any questions you have with your health care provider. Document Revised:  12/08/2016 Document Reviewed: 12/08/2016 Elsevier Patient Education  2020 [employer].

## 2019-07-09 LAB — LIPID PANEL
Cholesterol: 219 mg/dL — ABNORMAL HIGH (ref <200)
HDL: 87 mg/dL (ref >=50)
LDL Cholesterol (Calc): 114 mg/dL (calc) — ABNORMAL HIGH
Non-HDL Cholesterol (Calc): 132 mg/dL (calc) — ABNORMAL HIGH (ref <130)
Total CHOL/HDL Ratio: 2.5 (calc) (ref <5.0)
Triglycerides: 81 mg/dL (ref <150)

## 2019-07-09 LAB — COMPLETE METABOLIC PANEL WITH GFR
AG Ratio: 2 (calc) (ref 1.0–2.5)
ALT: 18 U/L (ref 6–29)
AST: 19 U/L (ref 10–35)
Albumin: 4.5 g/dL (ref 3.6–5.1)
Alkaline phosphatase (APISO): 57 U/L (ref 37–153)
BUN: 21 mg/dL (ref 7–25)
CO2: 29 mmol/L (ref 20–32)
Calcium: 9.4 mg/dL (ref 8.6–10.4)
Chloride: 103 mmol/L (ref 98–110)
Creat: 0.61 mg/dL (ref 0.50–0.99)
GFR, Est African American: 108 mL/min/{1.73_m2} (ref >=60)
GFR, Est Non African American: 93 mL/min/{1.73_m2} (ref >=60)
Globulin: 2.2 g/dL (calc) (ref 1.9–3.7)
Glucose, Bld: 89 mg/dL (ref 65–99)
Potassium: 4.2 mmol/L (ref 3.5–5.3)
Sodium: 139 mmol/L (ref 135–146)
Total Bilirubin: 0.3 mg/dL (ref 0.2–1.2)
Total Protein: 6.7 g/dL (ref 6.1–8.1)

## 2019-07-11 NOTE — Telephone Encounter (Signed)
Spoke with patient.  Last BMD 01/29/17 at [facility]. Mild osteopenia, repeat BMD 3-4 years.  Last AEX 04/23/18 Next AEX 09/13/19.  Patient reports she recently had planned knee surgery, now has a possible stress fracture below the knee. Asking when next BMD due. Advised after 01/30/20.   Patient states she will plan to discuss with Dr. Sabra Heck at her AEX , no order needed at this time. Advised I will update Dr. Sabra Heck and f/u if any additional recommendations. Patient agreeable.   Routing to provider for final review. Patient is agreeable to disposition. Will close encounter.

## 2019-07-14 DIAGNOSIS — M25561 Pain in right knee: Secondary | ICD-10-CM | POA: Diagnosis not present

## 2019-07-21 DIAGNOSIS — M25561 Pain in right knee: Secondary | ICD-10-CM | POA: Diagnosis not present

## 2019-07-25 DIAGNOSIS — M25561 Pain in right knee: Secondary | ICD-10-CM | POA: Diagnosis not present

## 2019-07-26 DIAGNOSIS — Z1231 Encounter for screening mammogram for malignant neoplasm of breast: Secondary | ICD-10-CM | POA: Diagnosis not present

## 2019-07-26 LAB — HM MAMMOGRAPHY

## 2019-07-27 DIAGNOSIS — M25561 Pain in right knee: Secondary | ICD-10-CM | POA: Diagnosis not present

## 2019-08-01 DIAGNOSIS — M25561 Pain in right knee: Secondary | ICD-10-CM | POA: Diagnosis not present

## 2019-08-05 DIAGNOSIS — M25561 Pain in right knee: Secondary | ICD-10-CM | POA: Diagnosis not present

## 2019-08-08 DIAGNOSIS — M25561 Pain in right knee: Secondary | ICD-10-CM | POA: Diagnosis not present

## 2019-08-10 ENCOUNTER — Encounter: Payer: Self-pay | Admitting: *Deleted

## 2019-08-18 ENCOUNTER — Encounter: Payer: PPO | Admitting: Physician Assistant

## 2019-09-13 ENCOUNTER — Ambulatory Visit (INDEPENDENT_AMBULATORY_CARE_PROVIDER_SITE_OTHER): Payer: PPO | Admitting: Obstetrics & Gynecology

## 2019-09-13 ENCOUNTER — Encounter: Payer: Self-pay | Admitting: Obstetrics & Gynecology

## 2019-09-13 ENCOUNTER — Other Ambulatory Visit: Payer: Self-pay

## 2019-09-13 VITALS — BP 118/70 | HR 76 | Resp 12 | Ht <= 58 in | Wt 120.0 lb

## 2019-09-13 DIAGNOSIS — Z01419 Encounter for gynecological examination (general) (routine) without abnormal findings: Secondary | ICD-10-CM

## 2019-09-13 MED ORDER — ESTRADIOL 0.025 MG/24HR TD PTTW: 1.0000 | MEDICATED_PATCH | TRANSDERMAL | 4 refills | Status: DC

## 2019-09-13 NOTE — Progress Notes (Signed)
69 y.o. G17P1102 Married White or Caucasian female here for annual exam.  Doing well.  Denies vaginal bleeding.   Had right knee surgery, arthroscopy, done in May.  Did PT and just saw Dr. Veverly Fells today.  Had been vaccinated for Covid.    Had CT with Dr. Paulita Fujita last summer due to changes in bowel function.  This started after her neck surgery.  It is much better now.  She had a CT scan in June.  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    Denies vaginal bleeding.  Having minimal vaginal irritation.    Patient's last menstrual period was 10/18/2001.          Sexually active: No.  The current method of family planning is status post hysterectomy.    Exercising: No.  The patient does not participate in regular exercise at present. Smoker:  no  Health Maintenance: Pap:   06/27/13 neg  History of abnormal Pap:  yes MMG:  07/26/19 BIRADS 1 negative/density c Colonoscopy:  07/14/18 diverticulosis f/u 5 years BMD:   01/29/17 Osteopenia  TDaP:  2012 Pneumonia vaccine(s):  completed Shingrix:   completed Hep C testing: 12/25/14 Neg Screening Labs: PCP, done 05/2019   reports that she quit smoking about 32 years ago. She has a 7.50 pack-year smoking history. She has never used smokeless tobacco. She reports current alcohol use of about 6.0 - 7.0 standard drinks of alcohol per week. She reports that she does not use drugs.  Past Medical History:  Diagnosis Date  . Anxiety   . Arthritis    in neck  . Bursitis of right hip   . Carpal tunnel syndrome, bilateral    rt wrist  . Complete rotator cuff tear of left shoulder   . Eagle's syndrome   . GERD (gastroesophageal reflux disease)   . Heart murmur    as child  . HNP (herniated nucleus pulposus)   . Interstitial cystitis   . Kidney cysts   . Osteopenia   . Plantar fasciitis   . Spinal stenosis   . Tingling    rt foot  . Wears hearing aid     Past Surgical History:  Procedure Laterality Date  . ABDOMINAL HYSTERECTOMY  2003   BSO  .  ANAL SPHINCTEROTOMY    . CARPAL TUNNEL RELEASE     LEFT  . CERVICAL FUSION  02/25/2018   c1-c2 with laminectomy  . CESAREAN SECTION    . CYSTOSCOPY    . [place]  . KNEE ARTHROSCOPY Right 06/22/2019   Dr. Veverly Fells   . LUMBAR LAMINECTOMY    . LUMBAR LAMINECTOMY/DECOMPRESSION MICRODISCECTOMY N/A 08/09/2014   Procedure: MICRO LUMBAR DECOMPRESSION L3-4,  REDO DECOMPRESSION, MICRODISCECTOMY L4-5, FORAMINOTOMY L3-L4, L4-L5 BILATERAL;  Surgeon: Susa Day, MD;  Location: [facility];  Service: Orthopedics;  Laterality: N/A;  . MYRINGOTOMY    . NASAL SINUS SURGERY    . TONSILLECTOMY     LEFT only, due to Eagle Syndrome  . TONSILLECTOMY  10/14   right side due to Eagles syndrome  . TRIGGER FINGER RELEASE     RIGHT  . TYMPANOSTOMY TUBE PLACEMENT      Current Outpatient Medications  Medication Sig Dispense Refill  . Ascorbic Acid (VITAMIN C) 100 MG tablet Take 100 mg by mouth daily.    . Biotin 1 MG CAPS Take 1 capsule by mouth daily.     . calcium carbonate (OS-CAL - DOSED IN MG OF ELEMENTAL CALCIUM) 1250 (500 Ca)  MG tablet Take 1 tablet by mouth.    . cholecalciferol (VITAMIN D) 1000 UNITS tablet Take 3,000 Units by mouth daily.     Marland Kitchen estradiol (VIVELLE-DOT) 0.025 MG/24HR PLACE 1 PATCH ONTO THE SKIN 2 TIMES A WEEK 24 patch 1  . omeprazole (PRILOSEC) 20 MG capsule Take 1 capsule 1 or 2 x /day before Meals to Prevent Heartburn & Indigestion 180 capsule 3  . Probiotic Product (PROBIOTIC PO) Take by mouth daily.    Marland Kitchen pyridOXINE (VITAMIN B-6) 100 MG tablet Take 100 mg by mouth daily.    . vitamin B-12 (CYANOCOBALAMIN) 100 MCG tablet Take 100 mcg by mouth daily.     No current facility-administered medications for this visit.    Family History  Problem Relation Age of Onset  . Diabetes Mother   . Hypertension Mother   . Hyperlipidemia Mother   . Osteoporosis Mother   . Hypertension Brother   . Hyperlipidemia Brother   . Prostate cancer Father        Bone    Review of  Systems  All other systems reviewed and are negative.   Exam:   BP 118/70 (BP Location: Right Arm, Patient Position: Sitting, Cuff Size: Normal)   Pulse 76   Resp 12   Ht 4\' 10"  (1.473 m)   Wt 120 lb (54.4 kg)   [name] 10/18/2001   BMI 25.08 kg/m   Height: 4\' 10"  (147.3 cm)  General appearance: alert, cooperative and appears stated age Head: Normocephalic, without obvious abnormality, atraumatic Neck: no adenopathy, supple, symmetrical, trachea midline and thyroid normal to inspection and palpation Lungs: clear to auscultation bilaterally Breasts: normal appearance, no masses or tenderness Heart: regular rate and rhythm Abdomen: soft, non-tender; bowel sounds normal; no masses,  no organomegaly Extremities: extremities normal, atraumatic, no cyanosis or edema Skin: Skin color, texture, turgor normal. No rashes or lesions Lymph nodes: Cervical, supraclavicular, and axillary nodes normal. No abnormal inguinal nodes palpated Neurologic: Grossly normal   Pelvic: External genitalia:  no lesions              Urethra:  normal appearing urethra with no masses, tenderness or lesions              Bartholins and Skenes: normal                 Vagina: normal appearing vagina with normal color and discharge, no lesions              Cervix: absent              Pap taken: No. Bimanual Exam:  Uterus:  uterus absent              Adnexa: no mass, fullness, tenderness               Rectovaginal: Confirms               Anus:  normal sphincter tone, no lesions  Chaperone, Terence Lux, CMA, was present for exam.  A:  Well Woman with normal exam PMP, on transdermal HRT H/o TAH/BSO H/o recurrent vaginitis that has resolved Osteopenia H/o interstitial cystitis H/o renal angiolipoma  P:   Mammogram guidelines reviewed.  Doing yearly. pap smear not indicated RF for vivelle dot 0.025mg  mg patches twice weekly.  She desires to continues.  Risks/benefits reviewed.  #24/4RF. Lab work done with  Dr. Melford Aase 05/2019 Colonoscopy up to date Return annually or prn

## 2019-09-15 ENCOUNTER — Encounter: Payer: Self-pay | Admitting: Obstetrics & Gynecology

## 2019-10-13 DIAGNOSIS — Z20822 Contact with and (suspected) exposure to covid-19: Secondary | ICD-10-CM | POA: Diagnosis not present

## 2019-10-19 DIAGNOSIS — M25561 Pain in right knee: Secondary | ICD-10-CM | POA: Diagnosis not present

## 2019-10-31 ENCOUNTER — Encounter: Payer: Self-pay | Admitting: Physician Assistant

## 2019-10-31 ENCOUNTER — Ambulatory Visit (INDEPENDENT_AMBULATORY_CARE_PROVIDER_SITE_OTHER): Payer: PPO | Admitting: Physician Assistant

## 2019-10-31 ENCOUNTER — Other Ambulatory Visit: Payer: Self-pay

## 2019-10-31 VITALS — BP 126/88 | HR 74 | Temp 97.6°F | Ht <= 58 in | Wt 123.0 lb

## 2019-10-31 DIAGNOSIS — M7501 Adhesive capsulitis of right shoulder: Secondary | ICD-10-CM

## 2019-10-31 DIAGNOSIS — N3010 Interstitial cystitis (chronic) without hematuria: Secondary | ICD-10-CM

## 2019-10-31 DIAGNOSIS — Z0001 Encounter for general adult medical examination with abnormal findings: Secondary | ICD-10-CM

## 2019-10-31 DIAGNOSIS — Z789 Other specified health status: Secondary | ICD-10-CM

## 2019-10-31 DIAGNOSIS — F1090 Alcohol use, unspecified, uncomplicated: Secondary | ICD-10-CM

## 2019-10-31 DIAGNOSIS — Z8639 Personal history of other endocrine, nutritional and metabolic disease: Secondary | ICD-10-CM | POA: Diagnosis not present

## 2019-10-31 DIAGNOSIS — H9193 Unspecified hearing loss, bilateral: Secondary | ICD-10-CM

## 2019-10-31 DIAGNOSIS — E559 Vitamin D deficiency, unspecified: Secondary | ICD-10-CM | POA: Diagnosis not present

## 2019-10-31 DIAGNOSIS — Z131 Encounter for screening for diabetes mellitus: Secondary | ICD-10-CM

## 2019-10-31 DIAGNOSIS — I10 Essential (primary) hypertension: Secondary | ICD-10-CM | POA: Diagnosis not present

## 2019-10-31 DIAGNOSIS — Z79899 Other long term (current) drug therapy: Secondary | ICD-10-CM

## 2019-10-31 DIAGNOSIS — Z0000 Encounter for general adult medical examination without abnormal findings: Secondary | ICD-10-CM

## 2019-10-31 DIAGNOSIS — M25561 Pain in right knee: Secondary | ICD-10-CM

## 2019-10-31 DIAGNOSIS — E785 Hyperlipidemia, unspecified: Secondary | ICD-10-CM | POA: Diagnosis not present

## 2019-10-31 DIAGNOSIS — Z7289 Other problems related to lifestyle: Secondary | ICD-10-CM

## 2019-10-31 DIAGNOSIS — Z136 Encounter for screening for cardiovascular disorders: Secondary | ICD-10-CM | POA: Diagnosis not present

## 2019-10-31 DIAGNOSIS — K219 Gastro-esophageal reflux disease without esophagitis: Secondary | ICD-10-CM

## 2019-10-31 DIAGNOSIS — I700 Atherosclerosis of aorta: Secondary | ICD-10-CM

## 2019-10-31 NOTE — Patient Instructions (Addendum)
FIBER SUPPLEMENT  Benefiber or Citracel is good for constipation/diarrhea/irritable bowel syndrome, it helps with weight loss and can help lower your bad cholesterol. Please do 1 TBSP in the morning in water, coffee, or tea. It can take up to a month before you can see a difference with your bowel movements. It is cheapest from [employer], [employer], [employer].   We will have to get a urine test before we start you on treatment.  You CAN NOT use opioids 7-10 days before treatment with this or during treatment OR if will cause withdrawal symptoms and CAN hurt you   Naltrexone can help block craving for opioids and alcohol.  AVOID OPIOIDS ON THIS MEDICATION.   Side effects  aches, pains  change in sex drive or performance  feeling anxious  headache  loss of appetite, nausea  runny nose, sinus problems, sneezing  stomach pain  trouble sleeping   If it reoccurs you can get boric acid supporistories prescription from here or can get from [employer].  Use dove soap only No dryer sheets hypoallergenic soap for undies.     Dupuytren's Contracture Dupuytren's contracture is a condition in which tissue under the skin of the palm becomes thick. This causes one or more of the fingers to curl inward (contract) toward the palm. After a while, the fingers may not be able to straighten out. This condition affects some or all of the fingers and the palm of the hand. This condition may affect one or both hands. Dupuytren's contracture is a long-term (chronic) condition that develops (progresses) slowly over time. There is no cure, but symptoms can be managed and progression can be slowed with treatment. This condition is usually not dangerous or painful, but it can interfere with everyday tasks. What are the causes?  This condition is caused by tissue (fascia) in the palm that gets thicker and tighter. When the fascia thickens, it pulls on the cords of tissue (tendons) that control finger movement.  This causes the fingers to contract. The cause of fascia thickening is not known. However, the condition is often passed along from parent to child (inherited). What increases the risk? The following factors may make you more likely to develop this condition:  Being 13 years of age or older.  Being female.  Having a family history of this condition.  Using tobacco products, including cigarettes, chewing tobacco, and e-cigarettes.  Drinking alcohol excessively.  Having diabetes.  Having a seizure disorder. What are the signs or symptoms? Early symptoms of this condition may include:  Thick, puckered skin on the hand.  One or more lumps (nodules) on the palm. Nodules may be tender when they first appear, but they are generally painless. Later symptoms of this condition may include:  Thick cords of tissue in the palm.  Fingers curled up toward the palm.  Inability to straighten the fingers into their normal position. Though this condition is usually painless, you may have discomfort when holding or grabbing objects. How is this diagnosed? This condition is diagnosed with a physical exam, which may include:  Looking at your hands and feeling your palms. This is to check for thickened fascia and nodules.  Measuring finger motion.  Doing the [place] tabletop test. You may be asked to try to put your hand on a surface, with your palm down and your fingers straight out. How is this treated? There is no cure for this condition, but treatment can relieve discomfort and make symptoms more manageable. Treatment options may include:  Physical therapy. This can strengthen your hand and increase flexibility.  Occupational therapy. This can help you with everyday tasks that may be more difficult because of your condition.  Shots (injections). Substances may be injected into your hand, such as: ? Medicines that help to decrease swelling (corticosteroids). ? Proteins (collagenase) to  weaken thick tissue. After a collagenase injection, your health care provider may stretch your fingers.  Needle aponeurotomy. A needle is pushed through the skin and into the fascia. Moving the needle against the fascia can weaken or break up the thick tissue.  Surgery. This may be needed if your condition causes discomfort or interferes with everyday activities. Physical therapy is usually needed after surgery. No treatment is guaranteed to cure this condition. Recurrence of symptoms is common. Follow these instructions at home: Hand care  Take these actions to help protect your hand from possible injury: ? Use tools that have padded grips. ? Wear protective gloves while you work with your hands. ? Avoid repetitive hand movements. General instructions  Take over-the-counter and prescription medicines only as told by your health care provider.  Manage any other conditions that you have, such as diabetes.  If physical therapy was prescribed, do exercises as told by your health care provider.  Do not use any products that contain nicotine or tobacco, such as cigarettes, e-cigarettes, and chewing tobacco. If you need help quitting, ask your health care provider.  If you drink alcohol: ? Limit how much you use to:  0-1 drink a day for women.  0-2 drinks a day for men. ? Be aware of how much alcohol is in your drink. In the [place] one drink equals one 12 oz bottle of beer (355 mL), one 5 oz glass of wine (148 mL), or one 1 oz glass of hard liquor (44 mL).  Keep all follow-up visits as told by your health care provider. This is important. Contact a health care provider if:  You develop new symptoms, or your symptoms get worse.  You have pain that gets worse or does not get better with medicine.  You have difficulty or discomfort with everyday tasks.  You develop numbness or tingling. Get help right away if:  You have severe pain.  Your fingers change color or become unusually  cold. Summary  Dupuytren's contracture is a condition in which tissue under the skin of the palm becomes thick.  This condition is caused by tissue (fascia) that thickens. When it thickens, it pulls on the cords of tissue (tendons) that control finger movement and makes the fingers to contract.  You are more likely to develop this condition if you are a man, are over 75 years of age, have a family history of the condition, and drink a lot of alcohol.  This condition can be treated with physical and occupational therapy, injections, and surgery.  Follow instructions about how to care for your hand. Get help right away if you have severe pain or your fingers change color or become cold. This information is not intended to replace advice given to you by your health care provider. Make sure you discuss any questions you have with your health care provider. Document Revised: 08/25/2017 Document Reviewed: 08/25/2017 Elsevier Patient Education  [place].   Naltrexone tablets What is this medicine? NALTREXONE (nal TREX one) helps you to remain free of your dependence on opiate drugs or alcohol. It blocks the 'high' that these substances can give you. This medicine is combined with counseling and  support groups. This medicine may be used for other purposes; ask your health care provider or pharmacist if you have questions. COMMON BRAND NAME(S): Depade, ReVia What should I tell my health care provider before I take this medicine? They need to know if you have any of these conditions:  if you have used drugs or alcohol within 7 to 10 days  kidney disease  liver disease, including hepatitis  an unusual or allergic reaction to naltrexone, other medicines, foods, dyes, or preservatives  pregnant or trying to get pregnant  breast-feeding How should I use this medicine? Take this medicine by mouth with a full glass of water. Follow the directions on the prescription label. Do not take  this medicine within 7 to 10 days of taking any opioid drugs. Take your medicine at regular intervals. Do not take your medicine more often than directed. Do not stop taking except on your doctor's advice. Talk to your pediatrician regarding the use of this medicine in children. Special care may be needed. Overdosage: If you think you have taken too much of this medicine contact a poison control center or emergency room at once. NOTE: This medicine is only for you. Do not share this medicine with others. What if I miss a dose? If you miss a dose and remember on the same day, take the missed dose. If you do not remember until the next day, ask your doctor or health care professional about rescheduling your doses. Do not take double or extra doses. What may interact with this medicine? Do not take this medicine with any of the following medications:  any prescription or street opioid drug like codiene, heroin, methadone This medicine may also interact with the following medications:  disulfiram  thioridazine This list may not describe all possible interactions. Give your health care provider a list of all the medicines, herbs, non-prescription drugs, or dietary supplements you use. Also tell them if you smoke, drink alcohol, or use illegal drugs. Some items may interact with your medicine. What should I watch for while using this medicine? Your condition will be monitored carefully while you are receiving this medicine. Visit your doctor or health care professional regularly. For this medicine to be most effective you should attend any counseling or support groups that your doctor or health care professional recommends. Do not try to overcome the effects of the medicine by taking large amounts of narcotics or by drinking large amounts of alcohol. This can cause severe problems including death. Also, you may be more sensitive to lower doses of narcotics after you stop taking this medicine. If you are  going to have surgery, tell your doctor or health care professional that you are taking this medicine. Do not treat yourself for coughs, colds, pain, or diarrhea. Ask your doctor or health care professional for advice. Some of the ingredients may interact with this medicine and cause side effects. Wear a medical ID bracelet or chain, and carry a card that describes your disease and details of your medicine and dosage times. You may get drowsy or dizzy. Do not drive, use machinery, or do anything that needs mental alertness until you know how this medicine affects you. Do not stand or sit up quickly, especially if you are an older patient. This reduces the risk of dizzy or fainting spells. Alcohol may interfere with the effect of this medicine. Avoid alcoholic drinks. What side effects may I notice from receiving this medicine? Side effects that you should report to your  doctor or health care professional as soon as possible:  allergic reactions like skin rash, itching or hives, swelling of the face, lips, or tongue  breathing problems  changes in vision, hearing  confusion  dark urine  depressed mood  diarrhea  fast or irregular heart beat  hallucination, loss of contact with reality  light-colored stools  right upper belly pain  suicidal thoughts or other mood changes  unusually weak or tired  vomiting  yellowing of the eyes or skin Side effects that usually do not require medical attention (report to your doctor or health care professional if they continue or are bothersome):  aches, pains  change in sex drive or performance  feeling anxious  headache  loss of appetite, nausea  runny nose, sinus problems, sneezing  stomach pain  trouble sleeping This list may not describe all possible side effects. Call your doctor for medical advice about side effects. You may report side effects to FDA at 1-800-FDA-1088. Where should I keep my medicine? Keep out of the reach  of children. Store at room temperature between 20 and 25 degrees C (68 and 77 degrees F). Throw away any unused medicine after the expiration date. NOTE: This sheet is a summary. It may not cover all possible information. If you have questions about this medicine, talk to your doctor, pharmacist, or health care provider.  2020 Elsevier/Gold Standard (2011-11-27 10:33:18)

## 2019-10-31 NOTE — Progress Notes (Signed)
MEDICARE ANNUAL WELLNESS VISIT AND FOLLOW UP Assessment:   CPE 1 year  Essential hypertension No medications at this time Monitor blood pressure  Continue DASH diet.   Reminder to go to the ER if any CP, SOB, nausea, dizziness, severe HA, changes vision/speech, left arm numbness and tingling and jaw pain.  Gastroesophageal reflux disease, esophagitis presence not specified Taking omeprazole in AM and reports well controlled Discussed diet, avoiding triggers and other lifestyle changes  Hx of prediabetes A1Cs well controlled in recent years  Hyperlipidemia, mixed Moderate elevations trying lifestyle changes Discussed dietary and exercise modifications Check lipids, plan to initiate med if LDL remains significantly elevated near 130  Medication management CMP   Interstitial cystitis  managing with lifestyle, follows urology  Aortic atherosclerosis ([place]) Control blood pressure, cholesterol, glucose, increase exercise.   Alcohol use Likely contributing to ICS, stools Will try to cut back with therapist and using hot tea/substitue No history of DT's/withdrawals Offered naltrexone will think about it and message back  Acute pain of right knee -     Uric acid -likely more OA- follow up with Dr. Veverly Fells  Medication management -     Magnesium  Vitamin D deficiency -     VITAMIN D 25 Hydroxy (Vit-D Deficiency, Fractures)    I provided 40 minutes of non-face-to-face time during this encounter including counseling, chart review, and critical decision making was preformed.   Future Appointments  Date Time Provider [place]  11/05/2020 10:00 AM Liane Comber, NP [facility] None      Plan:   During the course of the visit the patient was educated and counseled about appropriate screening and preventive services including:    Pneumococcal vaccine   Influenza vaccine  Prevnar 13  Td vaccine  Screening electrocardiogram, deferred, telephone visit  [address].  Colorectal cancer screening  Diabetes screening  Glaucoma screening  Nutrition counseling    Subjective:  Helen Glover is a 69 y.o. female who presents for Medicare Annual Wellness Visit and follow up for HTN, hyperlipidemia, prediabetes, GERD and vitamin D Def.   She states she has been increasing drinking with the pandemic and would like to try to cut back. She has been drinking wine and bourbon nightly. She has been having loose stools in the morning, and having BM's up to 4 x a day. Her husband drinks nightly. She does have a therapist that she is following with. .   02/25/18 patient had C1-C2 posterior laminectomy and fusion by Dr Matthew Saras, doing well since.  She has spinal stenosis with radiculopathy followed by Dr. Brien Few, getting injections with immense benefit. Follows with Dr. Veverly Fells for knee, recently had R knee arthroscopy 06/22/2019, still having some issues with that knee, going to follow up with her surgeon. She has bump on her left palm  She is taking omeprazole 20mg  in the morning.  She is also taking a probiotic tablet daily.  She had hysterectomy and continues on low dose estradiol patch, follows with GYN.   Interstitial cystitis - patient states mild now that lower stress since retired, managing with lifestyle only.   BMI is Body mass index is 25.71 kg/m., she has been working on diet, exercises limited by ortho conditions, starting PT soon.  Wt Readings from Last 3 Encounters:  10/31/19 123 lb (55.8 kg)  09/13/19 120 lb (54.4 kg)  07/08/19 119 lb (54 kg)   She does not check BP at home, not on any meds, today their BP is BP: 126/88  She does not workout. She denies chest pain, shortness of breath, dizziness.  She is not on cholesterol medication and denies myalgias. Her cholesterol is not at goal. The cholesterol last visit was:   Lab Results  Component Value Date   CHOL 219 (H) 07/08/2019   HDL 87 07/08/2019   LDLCALC 114 (H) 07/08/2019   TRIG 81  07/08/2019   CHOLHDL 2.5 07/08/2019   She has been working on diet and exercise for hx of prediabetes (A1C 5.7% in 2014), and denies hyperglycemia, hypoglycemia , increased appetite, nausea, paresthesia of the feet, polydipsia, polyuria, visual disturbances, vomiting and weight loss. Last A1C in the office was:  Lab Results  Component Value Date   HGBA1C 5.4 01/04/2018   Last GFR Lab Results  Component Value Date   GFRNONAA 93 07/08/2019   Patient is on Vitamin D supplement.   Lab Results  Component Value Date   VD25OH 62 08/11/2018      Medication Review:  Current Outpatient Medications (Endocrine & Metabolic):  .  estradiol (VIVELLE-DOT) 0.025 MG/24HR, Place 1 patch onto the skin 2 (two) times a week.     Current Outpatient Medications (Hematological):  .  vitamin B-12 (CYANOCOBALAMIN) 100 MCG tablet, Take 100 mcg by mouth daily.  Current Outpatient Medications (Other):  Marland Kitchen  Ascorbic Acid (VITAMIN C) 100 MG tablet, Take 100 mg by mouth daily. .  Biotin 1 MG CAPS, Take 1 capsule by mouth daily.  .  calcium carbonate (OS-CAL - DOSED IN MG OF ELEMENTAL CALCIUM) 1250 (500 Ca) MG tablet, Take 1 tablet by mouth. .  cholecalciferol (VITAMIN D) 1000 UNITS tablet, Take 3,000 Units by mouth daily.  Marland Kitchen  omeprazole (PRILOSEC) 20 MG capsule, Take 1 capsule 1 or 2 x /day before Meals to Prevent Heartburn & Indigestion .  Probiotic Product (PROBIOTIC PO), Take by mouth daily. Marland Kitchen  pyridOXINE (VITAMIN B-6) 100 MG tablet, Take 100 mg by mouth daily.  Allergies: Allergies  Allergen Reactions  . Other     Band-aid  . Codeine Hives  . Sulfa Antibiotics Nausea And Vomiting    Current Problems (verified) has Interstitial cystitis; GERD (gastroesophageal reflux disease); Hyperlipidemia; Hypertension; History of prediabetes; Hearing loss of both ears; Adhesive capsulitis of right shoulder; and Aortic atherosclerosis ([facility]) on their problem list.  Screening Tests Health Maintenance  Topic  Date Due  . INFLUENZA VACCINE  09/18/2019  . MAMMOGRAM  09/14/2020  . TETANUS/TDAP  09/23/2020  . COLONOSCOPY  07/14/2023  . DEXA SCAN  Completed  . COVID-19 Vaccine  Completed  . Hepatitis C Screening  Completed  . PNA vac Low Risk Adult  Completed    Last colonoscopy: 06/2018 Dr. Paulita Fujita due 5 years Last mammogram: 08/2019 [facility] Last pap smear/pelvic exam: 06/27/2013 Dr. Sabra Heck, DONE paps, TAH, pelvic only DEXA: 2018 Dr. Sabra Heck will get with next MGM -1.3 AB [place] 10/2016 CT AB 07/2018 - aortic atherosclerosis    Prior vaccinations: TD or Tdap: 2012  Influenza: 10/2018  Pneumococcal: 12/25/2016 Prevnar13: 2015 Shingles/Zostavax: 2007 Shingrix 2018, reports [facility] Covid 19: 2/2, 2021, pfizer   Names of Other Physician/Practitioners you currently use: 1. [facility] here for primary care 2. Donato Heinz, eye doctor, last visit 2020, wears glasses, scheduled in June 2021 3. Dr. Toy Cookey, dentist, last visit 2021  Patient Care Team: Unk Pinto, MD as PCP - General (Internal Medicine) Barbaraann Cao, [place] as Referring Physician (Optometry) Teena Irani, MD (Inactive) as Consulting Physician (Gastroenterology)  Meredith Pel, MD as Consulting Physician (Orthopedic Surgery) Berenice Primas, MD as Referring Physician (Orthopedic Surgery) Megan Salon, MD as Consulting Physician (Gynecology) Ardis Hughs, MD as Attending Physician (Urology) Leta Baptist, MD as Consulting Physician (Otolaryngology)  Surgical: She  has a past surgical history that includes Cesarean section; Myringotomy; Lumbar laminectomy; Carpal tunnel release; Trigger finger release; Anal sphincterotomy; Nasal sinus surgery; Abdominal hysterectomy (2003); Hemorrhoid surgery (N/A, 1998); Cystoscopy; Tympanostomy tube placement; Tonsillectomy; Tonsillectomy (10/14); Lumbar laminectomy/decompression microdiscectomy (N/A, 08/09/2014); Cervical fusion  (02/25/2018); and Knee arthroscopy (Right, 06/22/2019). Family Her family history includes Diabetes in her mother; Hyperlipidemia in her brother and mother; Hypertension in her brother and mother; Osteoporosis in her mother; Prostate cancer in her father. Social history  She reports that she quit smoking about 32 years ago. She has a 7.50 pack-year smoking history. She has never used smokeless tobacco. She reports current alcohol use of about 6.0 - 7.0 standard drinks of alcohol per week. She reports that she does not use drugs.    Objective:   Today's Vitals   10/31/19 1014  BP: 126/88  Pulse: 74  Temp: 97.6 F (36.4 C)  SpO2: 96%  Weight: 123 lb (55.8 kg)  Height: 4\' 10"  (1.473 m)   Body mass index is 25.71 kg/m.  General appearance: alert, no distress, WD/WN, female HEENT: normocephalic, sclerae anicteric, right TM with tube, left TM with perforation, nares patent, no discharge or erythema, pharynx normal Oral cavity: MMM, no lesions Neck: supple, no lymphadenopathy, no thyromegaly, no masses Heart: RRR, normal S1, S2, no murmurs Lungs: CTA bilaterally, no wheezes, rhonchi, or rales Abdomen: +bs, soft, non tender, non distended, no masses, no hepatomegaly, no splenomegaly Musculoskeletal: nontender, no swelling, no obvious deformity, Left palmar hand at 4th and 5th digit, hard fibrous cord with no obvious restriction/decreased strength on exam.  Extremities: no edema, no cyanosis, no clubbing Pulses: 2+ symmetric, upper and lower extremities, normal cap refill Neurological: alert, oriented x 3, CN2-12 intact, strength normal upper extremities and lower extremities, sensation normal throughout, DTRs 2+ throughout, no cerebellar signs, gait normal Psychiatric: normal affect, behavior normal, pleasant     Vicie Mutters, PA-C 10:15 AM [facility]         a

## 2019-11-01 LAB — COMPLETE METABOLIC PANEL WITH GFR
AG Ratio: 2 (calc) (ref 1.0–2.5)
ALT: 14 U/L (ref 6–29)
AST: 18 U/L (ref 10–35)
Albumin: 4.3 g/dL (ref 3.6–5.1)
Alkaline phosphatase (APISO): 56 U/L (ref 37–153)
BUN: 17 mg/dL (ref 7–25)
CO2: 30 mmol/L (ref 20–32)
Calcium: 9.3 mg/dL (ref 8.6–10.4)
Chloride: 106 mmol/L (ref 98–110)
Creat: 0.54 mg/dL (ref 0.50–0.99)
GFR, Est African American: 112 mL/min/{1.73_m2} (ref >=60)
GFR, Est Non African American: 97 mL/min/{1.73_m2} (ref >=60)
Globulin: 2.1 g/dL (calc) (ref 1.9–3.7)
Glucose, Bld: 105 mg/dL — ABNORMAL HIGH (ref 65–99)
Potassium: 4.3 mmol/L (ref 3.5–5.3)
Sodium: 143 mmol/L (ref 135–146)
Total Bilirubin: 0.4 mg/dL (ref 0.2–1.2)
Total Protein: 6.4 g/dL (ref 6.1–8.1)

## 2019-11-01 LAB — CBC WITH DIFFERENTIAL/PLATELET
Absolute Monocytes: 547 cells/uL (ref 200–950)
Basophils Absolute: 43 cells/uL (ref 0–200)
Basophils Relative: 0.6 %
Eosinophils Absolute: 72 cells/uL (ref 15–500)
Eosinophils Relative: 1 %
HCT: 38.5 % (ref 35.0–45.0)
Hemoglobin: 12.5 g/dL (ref 11.7–15.5)
Lymphs Abs: 1836 cells/uL (ref 850–3900)
MCH: 31.4 pg (ref 27.0–33.0)
MCHC: 32.5 g/dL (ref 32.0–36.0)
MCV: 96.7 fL (ref 80.0–100.0)
MPV: 12.4 fL (ref 7.5–12.5)
Monocytes Relative: 7.6 %
Neutro Abs: 4702 cells/uL (ref 1500–7800)
Neutrophils Relative %: 65.3 %
Platelets: 239 10*3/uL (ref 140–400)
RBC: 3.98 10*6/uL (ref 3.80–5.10)
RDW: 12.5 % (ref 11.0–15.0)
Total Lymphocyte: 25.5 %
WBC: 7.2 10*3/uL (ref 3.8–10.8)

## 2019-11-01 LAB — HEMOGLOBIN A1C
Hgb A1c MFr Bld: 5.5 % of total Hgb (ref <5.7)
Mean Plasma Glucose: 111 (calc)
eAG (mmol/L): 6.2 (calc)

## 2019-11-01 LAB — URINALYSIS, ROUTINE W REFLEX MICROSCOPIC
Bacteria, UA: NONE SEEN /HPF
Bilirubin Urine: NEGATIVE
Glucose, UA: NEGATIVE
Hgb urine dipstick: NEGATIVE
Hyaline Cast: NONE SEEN /LPF
Ketones, ur: NEGATIVE
Nitrite: NEGATIVE
Protein, ur: NEGATIVE
RBC / HPF: NONE SEEN /HPF (ref 0–2)
Specific Gravity, Urine: 1.004 (ref 1.001–1.03)
WBC, UA: NONE SEEN /HPF (ref 0–5)
pH: 7.5 (ref 5.0–8.0)

## 2019-11-01 LAB — LIPID PANEL
Cholesterol: 222 mg/dL — ABNORMAL HIGH (ref <200)
HDL: 88 mg/dL (ref >=50)
LDL Cholesterol (Calc): 110 mg/dL (calc) — ABNORMAL HIGH
Non-HDL Cholesterol (Calc): 134 mg/dL (calc) — ABNORMAL HIGH (ref <130)
Total CHOL/HDL Ratio: 2.5 (calc) (ref <5.0)
Triglycerides: 125 mg/dL (ref <150)

## 2019-11-01 LAB — URIC ACID: Uric Acid, Serum: 3.1 mg/dL (ref 2.5–7.0)

## 2019-11-01 LAB — MAGNESIUM: Magnesium: 2.1 mg/dL (ref 1.5–2.5)

## 2019-11-01 LAB — TSH: TSH: 2.17 mIU/L (ref 0.40–4.50)

## 2019-11-01 LAB — VITAMIN D 25 HYDROXY (VIT D DEFICIENCY, FRACTURES): Vit D, 25-Hydroxy: 59 ng/mL (ref 30–100)

## 2019-11-02 ENCOUNTER — Other Ambulatory Visit: Payer: Self-pay

## 2019-11-02 DIAGNOSIS — R8271 Bacteriuria: Secondary | ICD-10-CM

## 2019-11-02 DIAGNOSIS — R300 Dysuria: Secondary | ICD-10-CM

## 2019-11-02 MED ORDER — NITROFURANTOIN MONOHYD MACRO 100 MG PO CAPS
100.0000 mg | ORAL_CAPSULE | Freq: Two times a day (BID) | ORAL | 0 refills | Status: DC
Start: 2019-11-02 — End: 2019-11-02

## 2019-11-02 MED ORDER — NITROFURANTOIN MONOHYD MACRO 100 MG PO CAPS
100.0000 mg | ORAL_CAPSULE | Freq: Two times a day (BID) | ORAL | 0 refills | Status: DC
Start: 2019-11-02 — End: 2020-07-10

## 2019-11-02 NOTE — Addendum Note (Signed)
Addended by: Vicie Mutters R on: 11/02/2019 10:18 AM   Modules accepted: Orders

## 2019-11-02 NOTE — Progress Notes (Signed)
Stated ENCOUNTER to add Urine Culture

## 2019-11-03 LAB — URINE CULTURE
MICRO NUMBER:: 10954851
SPECIMEN QUALITY:: ADEQUATE

## 2019-11-08 DIAGNOSIS — M25561 Pain in right knee: Secondary | ICD-10-CM | POA: Diagnosis not present

## 2019-11-08 DIAGNOSIS — M1711 Unilateral primary osteoarthritis, right knee: Secondary | ICD-10-CM | POA: Diagnosis not present

## 2019-11-29 DIAGNOSIS — H903 Sensorineural hearing loss, bilateral: Secondary | ICD-10-CM | POA: Diagnosis not present

## 2019-11-29 DIAGNOSIS — H7203 Central perforation of tympanic membrane, bilateral: Secondary | ICD-10-CM | POA: Diagnosis not present

## 2019-12-01 ENCOUNTER — Ambulatory Visit: Payer: PPO

## 2019-12-04 DIAGNOSIS — Z23 Encounter for immunization: Secondary | ICD-10-CM | POA: Diagnosis not present

## 2019-12-07 DIAGNOSIS — L814 Other melanin hyperpigmentation: Secondary | ICD-10-CM | POA: Diagnosis not present

## 2019-12-07 DIAGNOSIS — D225 Melanocytic nevi of trunk: Secondary | ICD-10-CM | POA: Diagnosis not present

## 2019-12-07 DIAGNOSIS — L821 Other seborrheic keratosis: Secondary | ICD-10-CM | POA: Diagnosis not present

## 2019-12-07 DIAGNOSIS — D2261 Melanocytic nevi of right upper limb, including shoulder: Secondary | ICD-10-CM | POA: Diagnosis not present

## 2019-12-07 DIAGNOSIS — D485 Neoplasm of uncertain behavior of skin: Secondary | ICD-10-CM | POA: Diagnosis not present

## 2019-12-19 DIAGNOSIS — M25561 Pain in right knee: Secondary | ICD-10-CM | POA: Diagnosis not present

## 2019-12-20 DIAGNOSIS — D3001 Benign neoplasm of right kidney: Secondary | ICD-10-CM | POA: Diagnosis not present

## 2019-12-20 DIAGNOSIS — G8929 Other chronic pain: Secondary | ICD-10-CM | POA: Diagnosis not present

## 2019-12-20 DIAGNOSIS — N3281 Overactive bladder: Secondary | ICD-10-CM | POA: Diagnosis not present

## 2019-12-20 DIAGNOSIS — M545 Low back pain, unspecified: Secondary | ICD-10-CM | POA: Diagnosis not present

## 2019-12-20 DIAGNOSIS — M461 Sacroiliitis, not elsewhere classified: Secondary | ICD-10-CM | POA: Diagnosis not present

## 2019-12-28 DIAGNOSIS — M79642 Pain in left hand: Secondary | ICD-10-CM | POA: Diagnosis not present

## 2019-12-28 DIAGNOSIS — G5601 Carpal tunnel syndrome, right upper limb: Secondary | ICD-10-CM | POA: Diagnosis not present

## 2019-12-28 DIAGNOSIS — M65352 Trigger finger, left little finger: Secondary | ICD-10-CM | POA: Diagnosis not present

## 2019-12-28 DIAGNOSIS — M67442 Ganglion, left hand: Secondary | ICD-10-CM | POA: Diagnosis not present

## 2020-01-03 DIAGNOSIS — R030 Elevated blood-pressure reading, without diagnosis of hypertension: Secondary | ICD-10-CM | POA: Diagnosis not present

## 2020-01-03 DIAGNOSIS — M461 Sacroiliitis, not elsewhere classified: Secondary | ICD-10-CM | POA: Diagnosis not present

## 2020-01-05 DIAGNOSIS — M48061 Spinal stenosis, lumbar region without neurogenic claudication: Secondary | ICD-10-CM | POA: Diagnosis not present

## 2020-01-05 DIAGNOSIS — M545 Low back pain, unspecified: Secondary | ICD-10-CM | POA: Diagnosis not present

## 2020-01-17 DIAGNOSIS — M5416 Radiculopathy, lumbar region: Secondary | ICD-10-CM | POA: Diagnosis not present

## 2020-01-17 DIAGNOSIS — R030 Elevated blood-pressure reading, without diagnosis of hypertension: Secondary | ICD-10-CM | POA: Diagnosis not present

## 2020-01-26 DIAGNOSIS — M25561 Pain in right knee: Secondary | ICD-10-CM | POA: Diagnosis not present

## 2020-01-27 DIAGNOSIS — M5416 Radiculopathy, lumbar region: Secondary | ICD-10-CM | POA: Diagnosis not present

## 2020-01-27 DIAGNOSIS — M48061 Spinal stenosis, lumbar region without neurogenic claudication: Secondary | ICD-10-CM | POA: Diagnosis not present

## 2020-02-16 DIAGNOSIS — M5416 Radiculopathy, lumbar region: Secondary | ICD-10-CM | POA: Diagnosis not present

## 2020-02-29 DIAGNOSIS — M5416 Radiculopathy, lumbar region: Secondary | ICD-10-CM | POA: Diagnosis not present

## 2020-03-20 DIAGNOSIS — M7061 Trochanteric bursitis, right hip: Secondary | ICD-10-CM | POA: Diagnosis not present

## 2020-03-20 DIAGNOSIS — M5416 Radiculopathy, lumbar region: Secondary | ICD-10-CM | POA: Diagnosis not present

## 2020-03-20 DIAGNOSIS — R030 Elevated blood-pressure reading, without diagnosis of hypertension: Secondary | ICD-10-CM | POA: Diagnosis not present

## 2020-03-21 ENCOUNTER — Other Ambulatory Visit: Payer: Self-pay | Admitting: Internal Medicine

## 2020-03-21 DIAGNOSIS — K219 Gastro-esophageal reflux disease without esophagitis: Secondary | ICD-10-CM

## 2020-03-21 MED ORDER — OMEPRAZOLE 20 MG PO CPDR: DELAYED_RELEASE_CAPSULE | ORAL | 0 refills | Status: DC

## 2020-03-29 DIAGNOSIS — R030 Elevated blood-pressure reading, without diagnosis of hypertension: Secondary | ICD-10-CM | POA: Diagnosis not present

## 2020-03-29 DIAGNOSIS — M5416 Radiculopathy, lumbar region: Secondary | ICD-10-CM | POA: Diagnosis not present

## 2020-04-03 DIAGNOSIS — M25562 Pain in left knee: Secondary | ICD-10-CM | POA: Diagnosis not present

## 2020-04-18 DIAGNOSIS — M4807 Spinal stenosis, lumbosacral region: Secondary | ICD-10-CM | POA: Diagnosis not present

## 2020-04-18 DIAGNOSIS — M5416 Radiculopathy, lumbar region: Secondary | ICD-10-CM | POA: Diagnosis not present

## 2020-04-18 DIAGNOSIS — M48061 Spinal stenosis, lumbar region without neurogenic claudication: Secondary | ICD-10-CM | POA: Diagnosis not present

## 2020-04-18 DIAGNOSIS — M5116 Intervertebral disc disorders with radiculopathy, lumbar region: Secondary | ICD-10-CM | POA: Diagnosis not present

## 2020-05-10 DIAGNOSIS — L821 Other seborrheic keratosis: Secondary | ICD-10-CM | POA: Diagnosis not present

## 2020-05-10 DIAGNOSIS — L84 Corns and callosities: Secondary | ICD-10-CM | POA: Diagnosis not present

## 2020-05-10 DIAGNOSIS — L570 Actinic keratosis: Secondary | ICD-10-CM | POA: Diagnosis not present

## 2020-05-21 ENCOUNTER — Encounter (HOSPITAL_BASED_OUTPATIENT_CLINIC_OR_DEPARTMENT_OTHER): Payer: Self-pay

## 2020-06-07 DIAGNOSIS — H903 Sensorineural hearing loss, bilateral: Secondary | ICD-10-CM | POA: Diagnosis not present

## 2020-06-07 DIAGNOSIS — H7203 Central perforation of tympanic membrane, bilateral: Secondary | ICD-10-CM | POA: Diagnosis not present

## 2020-06-12 DIAGNOSIS — M545 Low back pain, unspecified: Secondary | ICD-10-CM | POA: Diagnosis not present

## 2020-06-12 DIAGNOSIS — M5416 Radiculopathy, lumbar region: Secondary | ICD-10-CM | POA: Diagnosis not present

## 2020-06-14 IMAGING — CT CT CERVICAL SPINE W/O CM
3 of 5 series · 9 of 33 positions shown, 11 images · non-contrast
Comparison: Plain films from Dr. [REDACTED] 11/10/2017.
Outside MRI 10/13/2017 [facility].

CLINICAL DATA: Evaluate C1-C2 abnormality.

EXAM:
CT CERVICAL SPINE WITHOUT CONTRAST
TECHNIQUE: Multidetector CT imaging of the cervical spine was performed without
intravenous contrast. Multiplanar CT image reconstructions were also
generated.

[Series 2: c-spine 2.00 br60 s3 axial bone · axial · 0.25mm/px · z∈[-742,-742]mm · 1 of 97 slices shown, 2 images]
[im 49/97  soft-tissue]
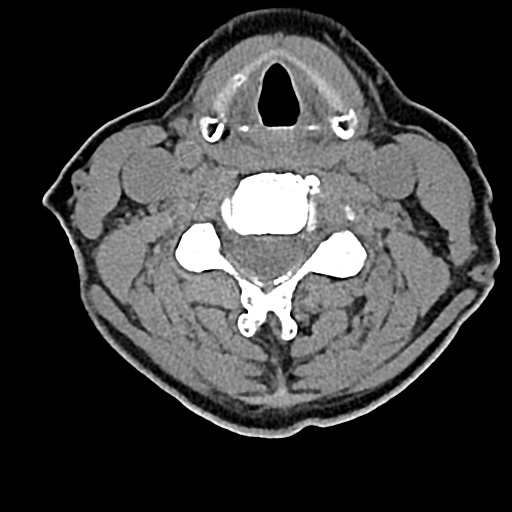
[im 49/97  bone]
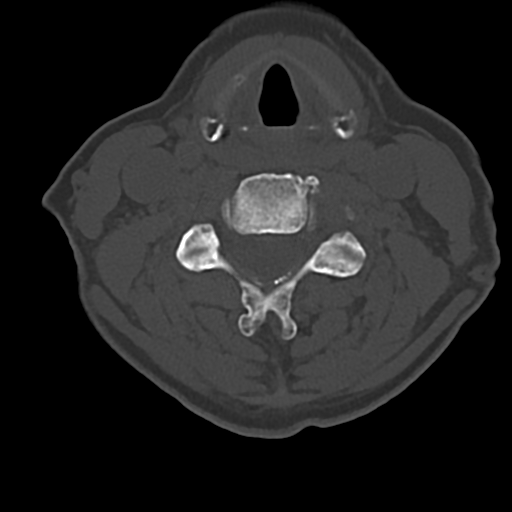

[Series 4: c-spine 2.00 br60 s3 sag sag bone · sagittal · 0.18mm/px · 5 of 47 slices shown, 6 images]
[im 16/47  bone]
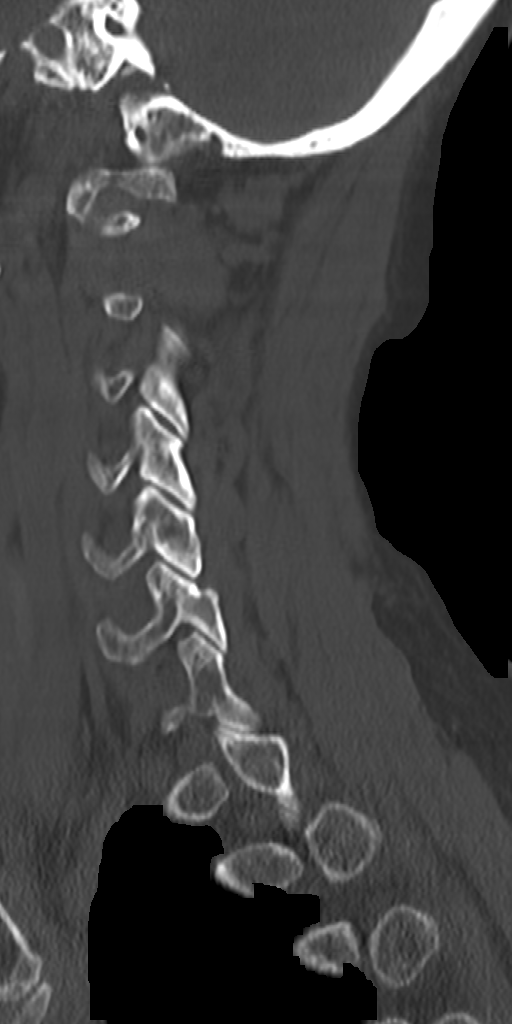
[im 20/47  bone]
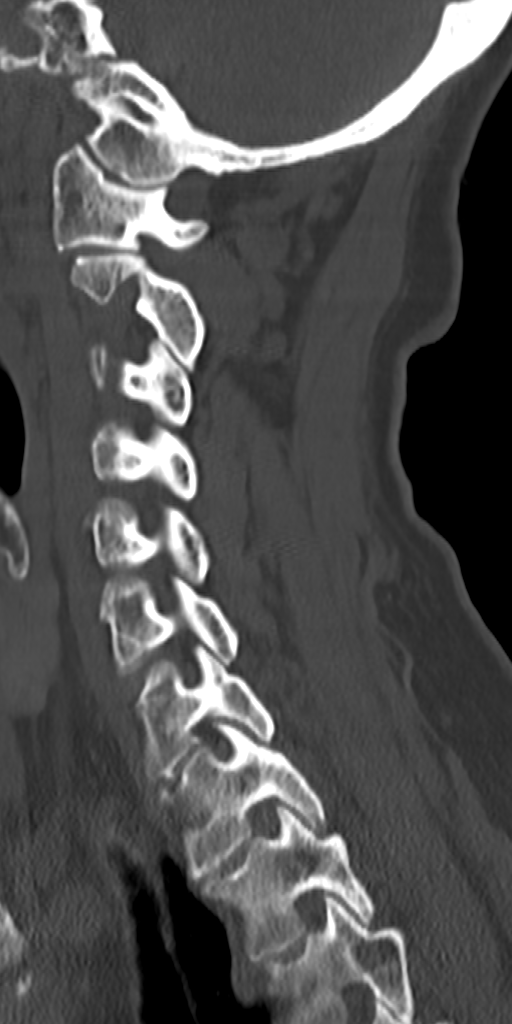
[im 24/47  soft-tissue]
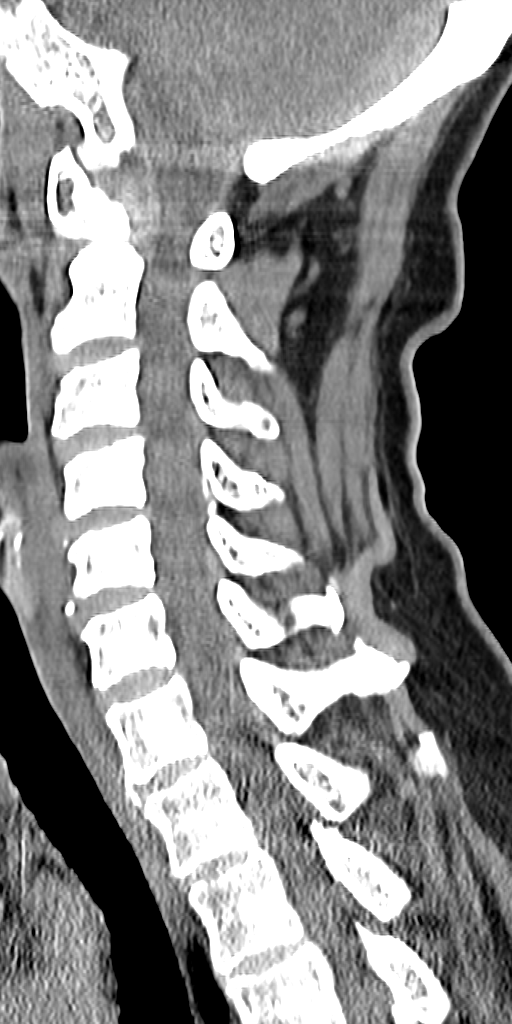
[im 24/47  bone]
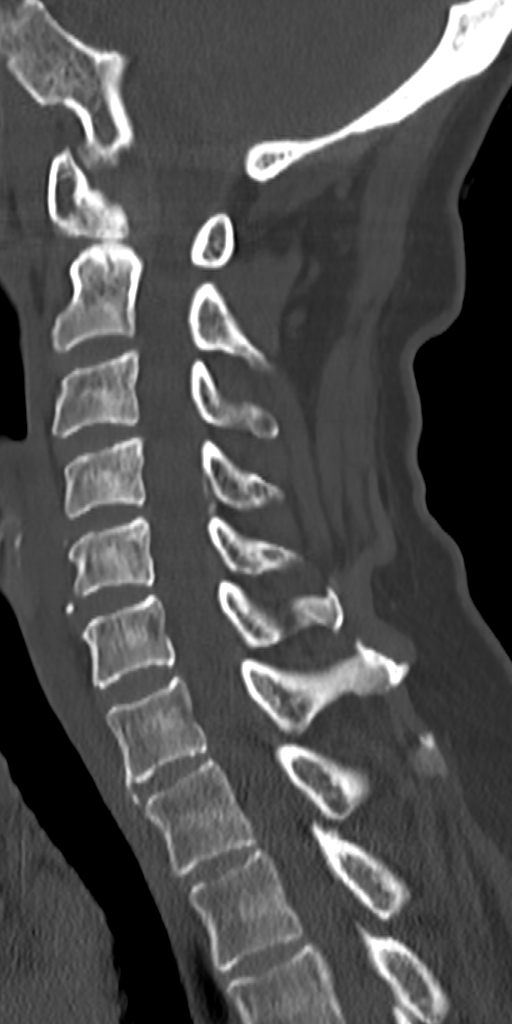
[im 27/47  bone]
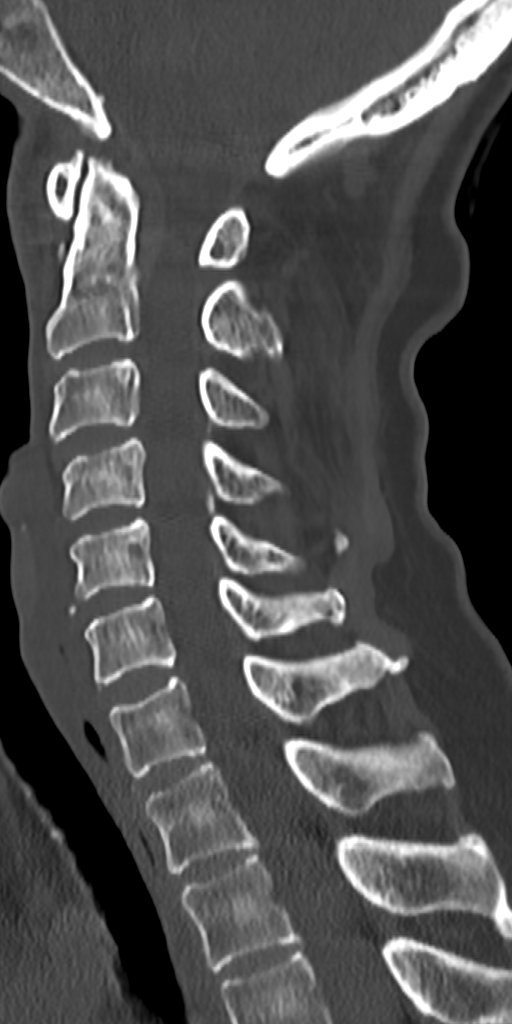
[im 31/47  bone]
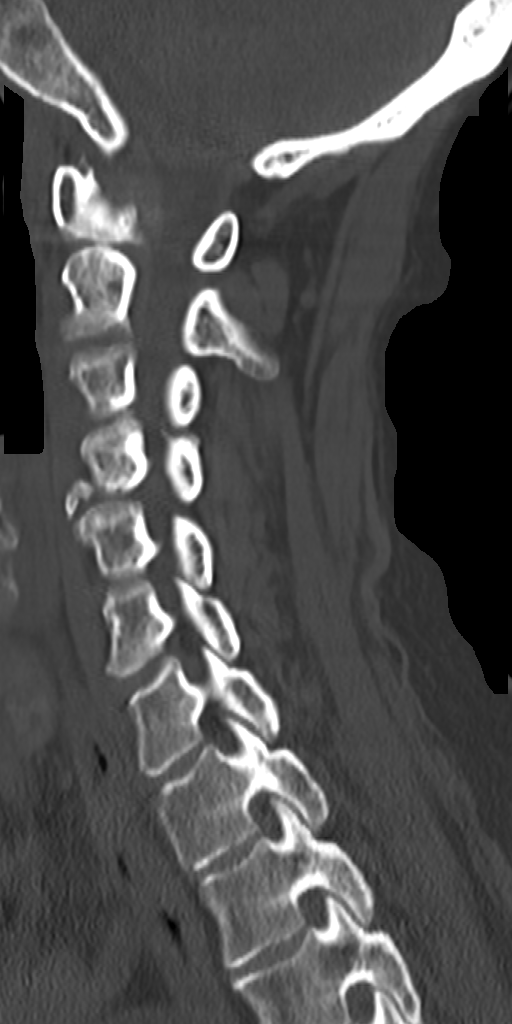

[Series 6: c-spine 2.00 hr60 s3 cor cor bone · coronal · 0.18mm/px · 3 of 47 slices shown]
[im 10/47  bone]
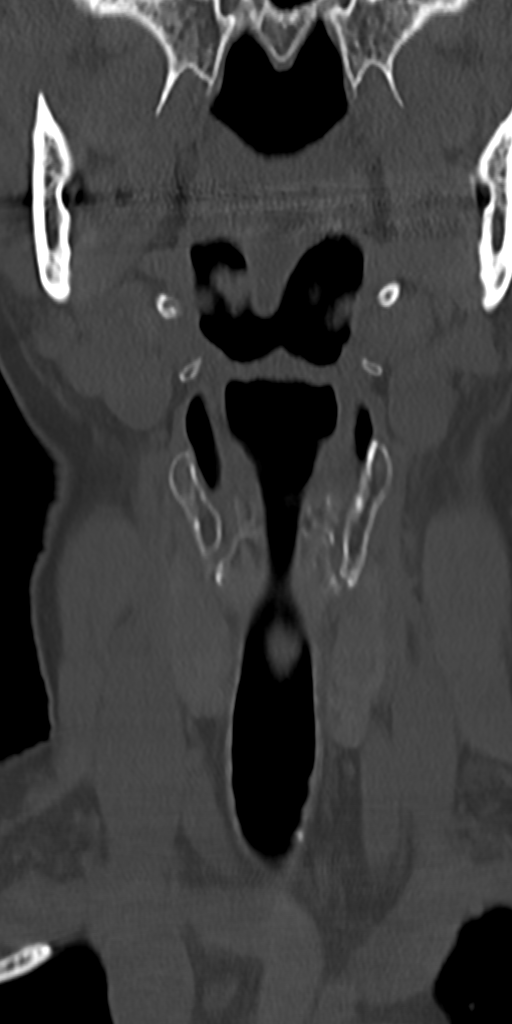
[im 19/47  bone]
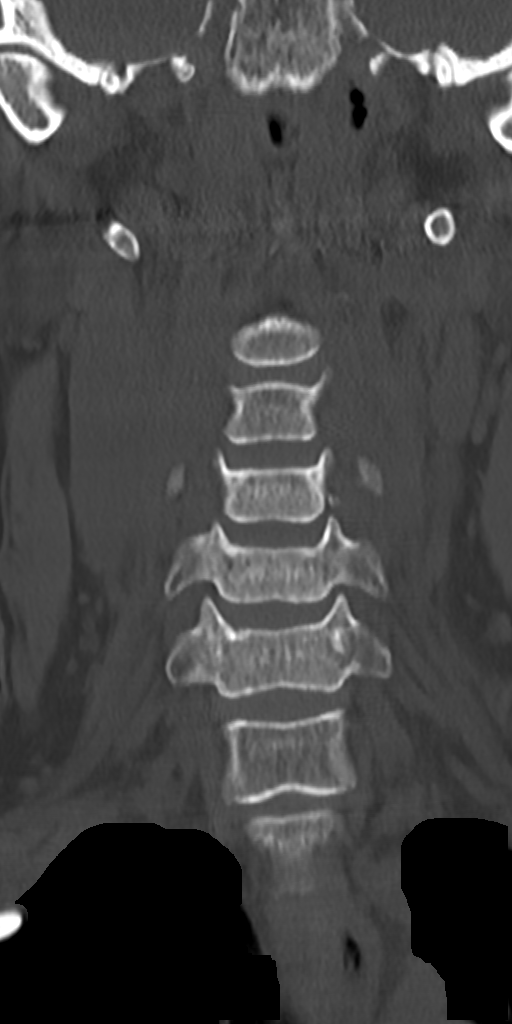
[im 28/47  bone]
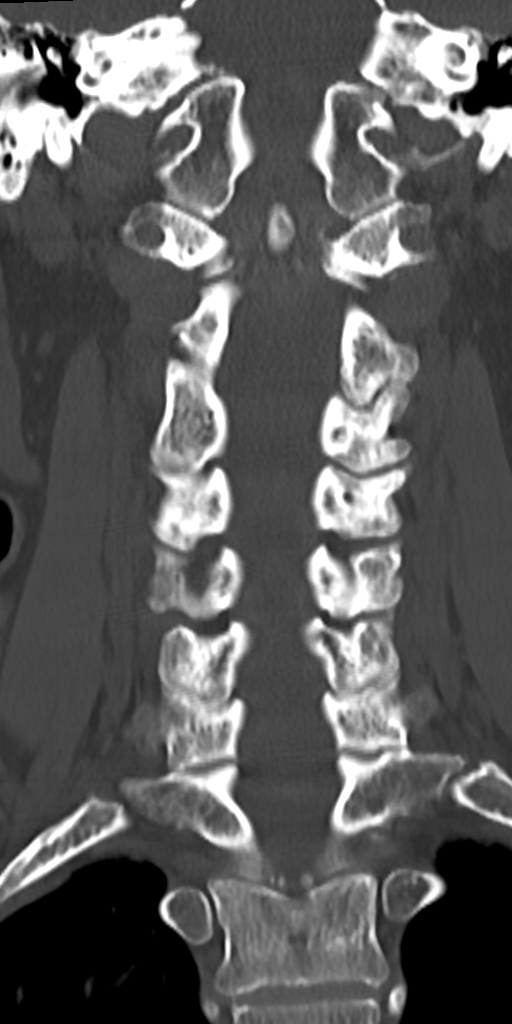

[9 of 33 positions shown; findings below may reference images not displayed]

FINDINGS: Alignment: Normal.

Skull base and vertebrae: No acute fracture. No primary bone lesion
or focal pathologic process.

Soft tissues and spinal canal: Moderate pannus surrounds the
odontoid. There is no C1-C2 widening. No occult fracture or erosive
change.

Narrowing of the spinal canal at C1, opposite the odontoid, is
estimated at 6 mm, but is better visualized on prior MR.

Disc levels: No concerning areas of disc space narrowing or calcific
protrusion. Advanced facet arthropathy contributes to foraminal
narrowing at C2-3 and C3-4 on the LEFT

Upper chest: Lung apices clear

Other: None.
IMPRESSION: Moderate pannus surrounds the odontoid, contributing to narrowing of
the spinal canal at C1, estimated 6 mm AP diameter.

No C1-C2 widening. No occult upper cervical fracture, or erosive
change of the odontoid.

Asymmetric foraminal narrowing at LEFT C2-3 and LEFT C3-4, facet
mediated.

## 2020-06-16 ENCOUNTER — Other Ambulatory Visit: Payer: Self-pay | Admitting: Internal Medicine

## 2020-06-16 DIAGNOSIS — K219 Gastro-esophageal reflux disease without esophagitis: Secondary | ICD-10-CM

## 2020-06-27 DIAGNOSIS — M5416 Radiculopathy, lumbar region: Secondary | ICD-10-CM | POA: Diagnosis not present

## 2020-07-09 ENCOUNTER — Encounter: Payer: Self-pay | Admitting: Adult Health

## 2020-07-09 NOTE — Progress Notes (Signed)
MEDICARE ANNUAL WELLNESS VISIT AND FOLLOW UP Assessment:   Annual Medicare Wellness Visit Due annually   Aortic atherosclerosis ([place]) Control blood pressure, cholesterol, glucose, increase exercise.   Essential hypertension Recently controlled off of medications Monitor blood pressure  Continue DASH diet.   Reminder to go to the ER if any CP, SOB, nausea, dizziness, severe HA, changes vision/speech, left arm numbness and tingling and jaw pain.  Gastroesophageal reflux disease, esophagitis presence not specified Taking omeprazole in AM and reports well controlled  Discussed diet, avoiding triggers and other lifestyle changes  Hx of prediabetes A1Cs well controlled in recent years  Hyperlipidemia, mixed Mild elevations trying lifestyle changes Discussed dietary and exercise modifications Check lipids, plan to initiate med if LDL remains significantly elevated near 130  Medication management CMP   Interstitial cystitis  managing with lifestyle, follows urology  Alcohol use  Has reduced intake, avoiding daily intake  Working with therapist No history of DT's/withdrawals  Medication management -     Magnesium  Vitamin D deficiency -     VITAMIN D 25 Hydroxy (Vit-D Deficiency, Fractures)  Lumbar back pain with R radicular sx S/p laminectomy, neurosurgery following, planning NCV Reports doing well with aleve/gabapentin  Left medial epicondylitis Continue NSAIDS, trying to limit oral can try voltaren/aspercreme, RICE, and exercise given, can get brace If not better follow up with established ortho   I provided 40 minutes of non-face-to-face time during this encounter including counseling, chart review, and critical decision making was preformed.   Future Appointments  Date Time Provider [place]  09/13/2020  1:45 PM Megan Salon, MD [facility]  11/05/2020 10:00 AM Liane Comber, NP [facility] None  07/10/2021 11:00 AM Liane Comber, NP [facility]  None      Plan:   During the course of the visit the patient was educated and counseled about appropriate screening and preventive services including:    Pneumococcal vaccine   Influenza vaccine  Prevnar 13  Td vaccine  Screening electrocardiogram, deferred, telephone visit [place].  Colorectal cancer screening  Diabetes screening  Glaucoma screening  Nutrition counseling    Subjective:  Helen Glover is a 70 y.o. female who presents for Medicare Annual Wellness Visit and follow up for HTN, hyperlipidemia, prediabetes, GERD and vitamin D Def.   She has been down with the pandemic, mild sx, does have a therapist that she is following with with benefit. She was drinking a lot, variable, husband also drinks a lot, she has reduced to only the weekends, recently 6-8 per week.   02/25/18 patient had C1-C2 posterior laminectomy and fusion by Dr Matthew Saras, doing well since. She has spinal stenosis with radiculopathy followed by Dr. Brien Few, had surgery by Dr. Ronnald Ramp 06/18/2020, also has been getting injections with immense benefit, doing gabapentin 200 mg at night. Has residual RLE numbness/tingling, has NCV planned.   Follows with Dr. Veverly Fells for knee, recently had R knee arthroscopy 06/22/2019, doing well. R shoulder had ? Frozen shoulder, but improved after neck surgery.  She does note mild intermittent L medial elbow pain/tenderness.   She is taking omeprazole 20mg  in the morning.  She is also taking a probiotic tablet daily.  She had hysterectomy and continues on low dose estradiol patch, follows with GYN.   Interstitial cystitis - patient states mild now that lower stress since retired, managing with lifestyle only.   BMI is Body mass index is 25.08 kg/m., she has been working on diet, exercises limited by ortho conditions, does upper body  weights and walks as able.  Wt Readings from Last 3 Encounters:  07/10/20 120 lb (54.4 kg)  10/31/19 123 lb (55.8 kg)  09/13/19 120 lb (54.4  kg)   She does not check BP at home, not on any meds, today their BP is BP: 122/72  She does workout. She denies chest pain, shortness of breath, dizziness.  She has aortic atherosclerosis per CT 07/2018.   She is not on cholesterol medication and denies myalgias. Her cholesterol is not at goal. No family hx of MI/CVA. The cholesterol last visit was:   Lab Results  Component Value Date   CHOL 222 (H) 10/31/2019   HDL 88 10/31/2019   LDLCALC 110 (H) 10/31/2019   TRIG 125 10/31/2019   CHOLHDL 2.5 10/31/2019   She has been working on diet and exercise for hx of prediabetes (A1C 5.7% in 2014), and denies hyperglycemia, hypoglycemia , increased appetite, nausea, paresthesia of the feet, polydipsia, polyuria, visual disturbances, vomiting and weight loss. Last A1C in the office was:  Lab Results  Component Value Date   HGBA1C 5.5 10/31/2019   Last GFR Lab Results  Component Value Date   GFRNONAA 97 10/31/2019   Patient is on Vitamin D supplement, taking ? 3000 IU daily.   Lab Results  Component Value Date   VD25OH 59 10/31/2019      Medication Review:  Current Outpatient Medications (Endocrine & Metabolic):  .  estradiol (VIVELLE-DOT) 0.025 MG/24HR, Place 1 patch onto the skin 2 (two) times a week.     Current Outpatient Medications (Hematological):  .  vitamin B-12 (CYANOCOBALAMIN) 100 MCG tablet, Take 100 mcg by mouth daily.  Current Outpatient Medications (Other):  Marland Kitchen  Ascorbic Acid (VITAMIN C) 100 MG tablet, Take 100 mg by mouth daily. .  Biotin 1 MG CAPS, Take 1 capsule by mouth daily.  .  calcium carbonate (OS-CAL - DOSED IN MG OF ELEMENTAL CALCIUM) 1250 (500 Ca) MG tablet, Take 1 tablet by mouth. .  cholecalciferol (VITAMIN D) 1000 UNITS tablet, Take 3,000 Units by mouth daily.  Marland Kitchen  GABAPENTIN PO, Take 200 mg by mouth at bedtime. Marland Kitchen  omeprazole (PRILOSEC) 20 MG capsule, TAKE 1 CAPSULE DAILY TO PREVENT HEARTBURN & INDIGESTION .  Probiotic Product (PROBIOTIC PO), Take by  mouth daily. Marland Kitchen  pyridOXINE (VITAMIN B-6) 100 MG tablet, Take 100 mg by mouth daily.  Allergies: Allergies  Allergen Reactions  . Other     Band-aid  . Codeine Hives  . Sulfa Antibiotics Nausea And Vomiting    Current Problems (verified) has Interstitial cystitis; GERD (gastroesophageal reflux disease); Hyperlipidemia; Hypertension; History of prediabetes; Vitamin D deficiency; Aortic atherosclerosis ([place]); and Lumbar back pain with radiculopathy affecting right lower extremity on their problem list.  Screening Tests Health Maintenance  Topic Date Due  . MAMMOGRAM  09/14/2020  . INFLUENZA VACCINE  09/17/2020  . TETANUS/TDAP  09/23/2020  . COLONOSCOPY (Pts 45-42yrs Insurance coverage will need to be confirmed)  07/14/2023  . DEXA SCAN  Completed  . COVID-19 Vaccine  Completed  . Hepatitis C Screening  Completed  . PNA vac Low Risk Adult  Completed  . HPV VACCINES  Aged Out   Immunization History  Administered Date(s) Administered  . DTaP 10/04/2010  . Fluad Quad(high Dose 65+) 10/26/2018, 12/04/2019  . Influenza, High Dose Seasonal PF 11/24/2016  . Influenza,inj,quad, With Preservative 11/17/2016, 10/19/2018  . Influenza-Unspecified 11/29/2012, 11/29/2013, 11/17/2014, 11/27/2015, 11/24/2016, 10/04/2017  . PFIZER(Purple Top)SARS-COV-2 Vaccination 03/08/2019, 03/26/2019, 11/14/2019, 05/17/2020  . Pneumococcal  Conjugate-13 12/21/2013  . Pneumococcal Polysaccharide-23 12/25/2016  . Pneumococcal-Unspecified 12/04/2002  . Zoster 12/03/2005  . Zoster Recombinat (Shingrix) 05/22/2016, 09/17/2016    Last colonoscopy: 06/2018 Dr. Paulita Fujita due 5 years Last mammogram: 08/2019 [facility], has upcoming scheduled Last pap smear/pelvic exam: 06/27/2013 Dr. Sabra Heck, DONE paps, TAH, pelvic only by GYN DEXA: 2018 Dr. Sabra Heck will get with next [facility] -1.3 - has scheduled  AB [place] 10/2016 CT AB 07/2018 - aortic atherosclerosis    Prior vaccinations: TD or Tdap: 2012 will boost PRN Influenza:  2021  Pneumococcal: 12/25/2016 Prevnar13: 2015 Shingles/Zostavax: 2007 Shingrix 2018, reports [facility] 2/[facility] Covid 19: 2/2, 2021, pfizer + 2 boosters  Names of Other Physician/Practitioners you currently use: 1. [facility] here for primary care 2. Donato Heinz, eye doctor, last visit 07/2019, wears glasses, has scheduled 3. Dr. Toy Cookey, dentist, last visit 2022, goes q57m  Patient Care Team: Unk Pinto, MD as PCP - General (Internal Medicine) Barbaraann Cao, OD as Referring Physician (Optometry) Marlou Sa, Tonna Corner, MD as Consulting Physician (Orthopedic Surgery) Berenice Primas, MD as Referring Physician (Orthopedic Surgery) Megan Salon, MD as Consulting Physician (Gynecology) Ardis Hughs, MD as Attending Physician (Urology) Leta Baptist, MD as Consulting Physician (Otolaryngology) Arta Silence, MD as Consulting Physician (Gastroenterology)  Surgical: She  has a past surgical history that includes Cesarean section; Myringotomy; Lumbar laminectomy; Carpal tunnel release; Trigger finger release; Anal sphincterotomy; Nasal sinus surgery; Abdominal hysterectomy (2003); Hemorrhoid surgery (N/A, 1998); Cystoscopy; Tympanostomy tube placement; Tonsillectomy; Tonsillectomy (10/14); Lumbar laminectomy/decompression microdiscectomy (N/A, 08/09/2014); Cervical fusion (02/25/2018); and Knee arthroscopy (Right, 06/22/2019). Family Her family history includes Diabetes in her mother; Hyperlipidemia in her brother and mother; Hypertension in her brother and mother; Osteoporosis in her mother; Prostate cancer in her father. Social history  She reports that she quit smoking about 33 years ago. She has a 7.50 pack-year smoking history. She has never used smokeless tobacco. She reports current alcohol use of about 6.0 - 7.0 standard drinks of alcohol per week. She reports that she does not use drugs.   MEDICARE WELLNESS OBJECTIVES: Physical  activity: Current Exercise Habits: Home exercise routine, Type of exercise: strength training/weights;walking, Time (Minutes): 25, Frequency (Times/Week): 5, Weekly Exercise (Minutes/Week): 125, Intensity: Mild, Exercise limited by: orthopedic condition(s) Cardiac risk factors: Cardiac Risk Factors include: advanced age (>75men, >81 women);dyslipidemia;hypertension;smoking/ tobacco exposure Depression/mood screen:   Depression screen [facility] 2/9 07/10/2020  Decreased Interest 1  Down, Depressed, Hopeless 1  PHQ - 2 Score 2  Altered sleeping 0  Tired, decreased energy 1  Change in appetite 0  Feeling bad or failure about yourself  0  Trouble concentrating 0  Moving slowly or fidgety/restless 0  Suicidal thoughts 0  PHQ-9 Score 3  Difficult doing work/chores Not difficult at all    ADLs:  In your present state of health, do you have any difficulty performing the following activities: 07/10/2020  Hearing? N  Comment has hearing aids  Vision? N  Difficulty concentrating or making decisions? N  Walking or climbing stairs? N  Dressing or bathing? N  Doing errands, shopping? N  Some recent data might be hidden     Cognitive Testing  Alert? Yes  Normal Appearance?Yes  Oriented to person? Yes  Place? Yes   Time? Yes  Recall of three objects?  Yes  Can perform simple calculations? Yes  Displays appropriate judgment?Yes  Can read the correct time from a watch face?Yes  EOL planning: Does Patient Have a Medical  Advance Directive?: Yes Type of Advance Directive: [place] will Does patient want to make changes to medical advance directive?: No - Patient declined Copy of [place] in Chart?: No - copy requested      Objective:   Today's Vitals   07/10/20 1057  BP: 122/72  Pulse: 80  Temp: (!) 97.2 F (36.2 C)  SpO2: 98%  Weight: 120 lb (54.4 kg)   Body mass index is 25.08 kg/m.  General appearance: alert, no distress, WD/WN,  female HEENT: normocephalic, sclerae anicteric, right TM with tube, left TM with perforation, nares patent, no discharge or erythema, pharynx normal Oral cavity: MMM, no lesions Neck: supple, no lymphadenopathy, no thyromegaly, no masses Heart: RRR, normal S1, S2, no murmurs Lungs: CTA bilaterally, no wheezes, rhonchi, or rales Abdomen: +bs, soft, non tender, non distended, no masses, no hepatomegaly, no splenomegaly Musculoskeletal: nontender, no swelling, no obvious deformity. She does have mildly increased radicular sx with R straight leg raise. L medial epicondyle with mild tenderness, without effusion/erythema, not notably worse with tendon palpation or flexion, no joint effusion, crepitus tenderness.  Extremities: no edema, no cyanosis, no clubbing Pulses: 2+ symmetric, upper and lower extremities, normal cap refill Neurological: alert, oriented x 3, CN2-12 intact, strength normal upper extremities and lower extremities, sensation mildly diminished R foot dorsum, DTRs 2+ throughout, no cerebellar signs, gait normal Psychiatric: normal affect, behavior normal, pleasant    Medicare Attestation I have personally reviewed: The patient's medical and social history Their use of alcohol, tobacco or illicit drugs Their current medications and supplements The patient's functional ability including ADLs,fall risks, home safety risks, cognitive, and hearing and visual impairment Diet and physical activities Evidence for depression or mood disorders  The patient's weight, height, BMI, and visual acuity have been recorded in the chart.  I have made referrals, counseling, and provided education to the patient based on review of the above and I have provided the patient with a written personalized care plan for preventive services.     [name], NP 11:36 AM [facility]

## 2020-07-10 ENCOUNTER — Ambulatory Visit (INDEPENDENT_AMBULATORY_CARE_PROVIDER_SITE_OTHER): Payer: PPO | Admitting: Adult Health

## 2020-07-10 ENCOUNTER — Encounter: Payer: Self-pay | Admitting: Adult Health

## 2020-07-10 ENCOUNTER — Other Ambulatory Visit: Payer: Self-pay

## 2020-07-10 VITALS — BP 122/72 | HR 80 | Temp 97.2°F | Wt 120.0 lb

## 2020-07-10 DIAGNOSIS — I700 Atherosclerosis of aorta: Secondary | ICD-10-CM | POA: Diagnosis not present

## 2020-07-10 DIAGNOSIS — Z0001 Encounter for general adult medical examination with abnormal findings: Secondary | ICD-10-CM

## 2020-07-10 DIAGNOSIS — Z87898 Personal history of other specified conditions: Secondary | ICD-10-CM | POA: Diagnosis not present

## 2020-07-10 DIAGNOSIS — I10 Essential (primary) hypertension: Secondary | ICD-10-CM

## 2020-07-10 DIAGNOSIS — E785 Hyperlipidemia, unspecified: Secondary | ICD-10-CM | POA: Diagnosis not present

## 2020-07-10 DIAGNOSIS — E559 Vitamin D deficiency, unspecified: Secondary | ICD-10-CM | POA: Diagnosis not present

## 2020-07-10 DIAGNOSIS — N3010 Interstitial cystitis (chronic) without hematuria: Secondary | ICD-10-CM

## 2020-07-10 DIAGNOSIS — M5416 Radiculopathy, lumbar region: Secondary | ICD-10-CM | POA: Diagnosis not present

## 2020-07-10 DIAGNOSIS — Z0000 Encounter for general adult medical examination without abnormal findings: Secondary | ICD-10-CM

## 2020-07-10 DIAGNOSIS — R6889 Other general symptoms and signs: Secondary | ICD-10-CM | POA: Diagnosis not present

## 2020-07-10 DIAGNOSIS — K219 Gastro-esophageal reflux disease without esophagitis: Secondary | ICD-10-CM

## 2020-07-10 NOTE — Patient Instructions (Addendum)
Ms. Helen Glover , Thank you for taking time to come for your Medicare Wellness Visit. I appreciate your ongoing commitment to your health goals. Please review the following plan we discussed and let me know if I can assist you in the future.   These are the goals we discussed: Goals    . LDL CALC < 100       This is a list of the screening recommended for you and due dates:  Health Maintenance  Topic Date Due  . Mammogram  09/14/2020  . Flu Shot  09/17/2020  . Tetanus Vaccine  09/23/2020  . Colon Cancer Screening  07/14/2023  . DEXA scan (bone density measurement)  Completed  . COVID-19 Vaccine  Completed  . Hepatitis C Screening: USPSTF Recommendation to screen - Ages 88-79 yo.  Completed  . Pneumonia vaccines  Completed  . HPV Vaccine  Aged Out       Golfer's Elbow  Golfer's elbow (medial epicondylitis) is a condition that results from inflammation of the strong bands of tissue (tendons) that attach your forearm muscles to the inside of your bone at the elbow. These tendons affect the muscles that bend the palm toward the wrist (flexion). The tendons become less flexible with age. This condition is called golfer's elbow because it is more common among people who constantly bend and twist their wrists, such as golfers. This injury is usually caused by repeated use of the same muscles. What are the causes? This condition is caused by:  Repeatedly flexing, turning, or twisting your wrist.  Frequently gripping objects with your hands.  Sudden injury. What increases the risk? This condition is more likely to develop in people who play golf, baseball, or tennis. This injury is more common among people who have jobs that require the constant use of their hands, such as:  People who use computers.  Carpenters.  Butchers.  Musicians. What are the signs or symptoms? This condition causes elbow pain that may spread to your forearm and upper arm. Symptoms of this condition  include:  Pain at the inner elbow, forearm, or wrist.  A weak grip in the hand. The pain may get worse when you bend your wrist downward. How is this diagnosed? This condition is diagnosed based on your symptoms, your medical history, and a physical exam. During the exam, your health care provider may:  Test your grip strength.  Move your wrist to check for pain. You may also have an MRI to:  Confirm the diagnosis.  Look for other issues.  Check for tears in the ligaments, muscles, or tendons. How is this treated? Treatment for this condition includes:  Stopping all activities that make you bend or twist your elbow or wrist and waiting until your pain and other symptoms go away before resuming those activities.  Wearing an elbow brace or wrist splint to restrict the movements that cause symptoms.  Icing your inner elbow, forearm, or wrist to relieve pain.  Taking NSAIDs, such as ibuprofen, or getting corticosteroid injections to reduce pain and swelling.  Doing stretching, range-of-motion, and strengthening exercises (physical therapy) as told by your health care provider. In rare cases, surgery may be needed if your condition does not improve. Follow these instructions at home: If you have a brace or splint:  Wear the brace or splint as told by your health care provider. Remove it only as told by your health care provider.  Check the skin around the brace or splint every day. Tell your  health care provider about any concerns.  Loosen the brace or splint if your fingers tingle, become numb, or turn cold and blue.  Keep it clean.  If the brace or splint is not waterproof: ? Do not let it get wet. ? Cover it with a watertight covering when you take a bath or shower. Managing pain, stiffness, and swelling  If directed, put ice on the injured area. To do this: ? If you have a removable brace or splint, remove it as told by your health care provider. ? Put ice in a  plastic bag. ? Place a towel between your skin and the bag. ? Leave the ice on for 20 minutes, 2-3 times a day. ? Remove the ice if your skin turns bright red. This is very important. If you cannot feel pain, heat, or cold, you have a greater risk of damage to the area.  Move your fingers often to avoid stiffness and swelling.   Activity  Rest your injured area as told by your health care provider.  Return to your normal activities as told by your health care provider. Ask your health care provider what activities are safe for you.  Do exercises as told by your health care provider. Lifestyle  If your condition is caused by sports, work with a trainer to make sure that you: ? Use the correct technique. ? Use the proper equipment.  If your condition is work related, talk with your employer about ways to manage your condition at work. General instructions  Take over-the-counter and prescription medicines only as told by your health care provider.  Do not use any products that contain nicotine or tobacco. These products include cigarettes, chewing tobacco, and vaping devices, such as e-cigarettes. If you need help quitting, ask your health care provider.  Keep all follow-up visits. This is important. How is this prevented?  Before and after activity: ? Warm up and stretch before being active. ? Cool down and stretch after being active. ? Give your body time to rest between periods of activity.  During activity: ? Make sure to use equipment that fits you. ? If you play golf, slow your golf swing to reduce shock in the arm when making contact with the ball.  Maintain physical fitness, including: ? Strength. ? Flexibility. ? Endurance.  Do exercises to strengthen the forearm muscles. Contact a health care provider if:  Your pain does not improve or it gets worse.  You notice numbness in your hand. Get help right away if:  Your pain is severe.  You cannot move your  wrist. Summary  Golfer's elbow, also called medial epicondylitis, is a condition that results from inflammation of the strong bands of tissue (tendons) that attach your forearm muscles to the inside of your bone at the elbow.  This injury usually results from overuse.  Symptoms of this condition include decreased grip strength and pain at the inner elbow, forearm, or wrist.  This injury is treated with rest, a brace or splint, ice, medicines, physical therapy, and surgery as needed. This information is not intended to replace advice given to you by your health care provider. Make sure you discuss any questions you have with your health care provider. Document Revised: 08/16/2019 Document Reviewed: 08/16/2019 Elsevier Patient Education  [place].

## 2020-07-11 LAB — COMPLETE METABOLIC PANEL WITH GFR
AG Ratio: 1.8 (calc) (ref 1.0–2.5)
ALT: 18 U/L (ref 6–29)
AST: 19 U/L (ref 10–35)
Albumin: 4.3 g/dL (ref 3.6–5.1)
Alkaline phosphatase (APISO): 66 U/L (ref 37–153)
BUN: 12 mg/dL (ref 7–25)
CO2: 28 mmol/L (ref 20–32)
Calcium: 9.2 mg/dL (ref 8.6–10.4)
Chloride: 107 mmol/L (ref 98–110)
Creat: 0.56 mg/dL (ref 0.50–0.99)
GFR, Est African American: 110 mL/min/{1.73_m2} (ref >=60)
GFR, Est Non African American: 95 mL/min/{1.73_m2} (ref >=60)
Globulin: 2.4 g/dL (calc) (ref 1.9–3.7)
Glucose, Bld: 88 mg/dL (ref 65–99)
Potassium: 4.2 mmol/L (ref 3.5–5.3)
Sodium: 141 mmol/L (ref 135–146)
Total Bilirubin: 0.3 mg/dL (ref 0.2–1.2)
Total Protein: 6.7 g/dL (ref 6.1–8.1)

## 2020-07-11 LAB — CBC WITH DIFFERENTIAL/PLATELET
Absolute Monocytes: 554 cells/uL (ref 200–950)
Basophils Absolute: 50 cells/uL (ref 0–200)
Basophils Relative: 0.7 %
Eosinophils Absolute: 94 cells/uL (ref 15–500)
Eosinophils Relative: 1.3 %
HCT: 37.7 % (ref 35.0–45.0)
Hemoglobin: 12.7 g/dL (ref 11.7–15.5)
Lymphs Abs: 1634 cells/uL (ref 850–3900)
MCH: 32.8 pg (ref 27.0–33.0)
MCHC: 33.7 g/dL (ref 32.0–36.0)
MCV: 97.4 fL (ref 80.0–100.0)
MPV: 12.1 fL (ref 7.5–12.5)
Monocytes Relative: 7.7 %
Neutro Abs: 4867 cells/uL (ref 1500–7800)
Neutrophils Relative %: 67.6 %
Platelets: 252 10*3/uL (ref 140–400)
RBC: 3.87 10*6/uL (ref 3.80–5.10)
RDW: 12.1 % (ref 11.0–15.0)
Total Lymphocyte: 22.7 %
WBC: 7.2 10*3/uL (ref 3.8–10.8)

## 2020-07-11 LAB — VITAMIN D 25 HYDROXY (VIT D DEFICIENCY, FRACTURES): Vit D, 25-Hydroxy: 62 ng/mL (ref 30–100)

## 2020-07-11 LAB — LIPID PANEL
Cholesterol: 234 mg/dL — ABNORMAL HIGH (ref <200)
HDL: 92 mg/dL (ref >=50)
LDL Cholesterol (Calc): 124 mg/dL (calc) — ABNORMAL HIGH
Non-HDL Cholesterol (Calc): 142 mg/dL (calc) — ABNORMAL HIGH (ref <130)
Total CHOL/HDL Ratio: 2.5 (calc) (ref <5.0)
Triglycerides: 81 mg/dL (ref <150)

## 2020-07-11 LAB — TSH: TSH: 1.8 mIU/L (ref 0.40–4.50)

## 2020-07-11 LAB — MAGNESIUM: Magnesium: 2.2 mg/dL (ref 1.5–2.5)

## 2020-07-24 DIAGNOSIS — R030 Elevated blood-pressure reading, without diagnosis of hypertension: Secondary | ICD-10-CM | POA: Diagnosis not present

## 2020-07-24 DIAGNOSIS — M5442 Lumbago with sciatica, left side: Secondary | ICD-10-CM | POA: Diagnosis not present

## 2020-07-24 DIAGNOSIS — M5416 Radiculopathy, lumbar region: Secondary | ICD-10-CM | POA: Diagnosis not present

## 2020-07-30 DIAGNOSIS — M85851 Other specified disorders of bone density and structure, right thigh: Secondary | ICD-10-CM | POA: Diagnosis not present

## 2020-07-30 DIAGNOSIS — M85852 Other specified disorders of bone density and structure, left thigh: Secondary | ICD-10-CM | POA: Diagnosis not present

## 2020-07-30 DIAGNOSIS — Z1231 Encounter for screening mammogram for malignant neoplasm of breast: Secondary | ICD-10-CM | POA: Diagnosis not present

## 2020-07-30 DIAGNOSIS — Z780 Asymptomatic menopausal state: Secondary | ICD-10-CM | POA: Diagnosis not present

## 2020-07-31 DIAGNOSIS — H6123 Impacted cerumen, bilateral: Secondary | ICD-10-CM | POA: Diagnosis not present

## 2020-08-01 DIAGNOSIS — S66911A Strain of unspecified muscle, fascia and tendon at wrist and hand level, right hand, initial encounter: Secondary | ICD-10-CM | POA: Diagnosis not present

## 2020-08-08 ENCOUNTER — Ambulatory Visit (INDEPENDENT_AMBULATORY_CARE_PROVIDER_SITE_OTHER): Payer: PPO | Admitting: Internal Medicine

## 2020-08-08 ENCOUNTER — Other Ambulatory Visit: Payer: Self-pay

## 2020-08-08 ENCOUNTER — Encounter: Payer: Self-pay | Admitting: Internal Medicine

## 2020-08-08 VITALS — BP 130/74 | HR 76 | Temp 97.3°F | Resp 17 | Ht <= 58 in | Wt 118.9 lb

## 2020-08-08 DIAGNOSIS — K121 Other forms of stomatitis: Secondary | ICD-10-CM

## 2020-08-08 DIAGNOSIS — B370 Candidal stomatitis: Secondary | ICD-10-CM

## 2020-08-08 DIAGNOSIS — J029 Acute pharyngitis, unspecified: Secondary | ICD-10-CM | POA: Diagnosis not present

## 2020-08-08 MED ORDER — AZITHROMYCIN 250 MG PO TABS: ORAL_TABLET | ORAL | 1 refills | Status: DC

## 2020-08-08 MED ORDER — FLUCONAZOLE 150 MG PO TABS: ORAL_TABLET | ORAL | 0 refills | Status: DC

## 2020-08-08 MED ORDER — DEXAMETHASONE 2 MG PO TABS: ORAL_TABLET | ORAL | 1 refills | Status: DC

## 2020-08-08 NOTE — Progress Notes (Signed)
Future Appointments  Date Time Provider [place]  08/08/2020  3:00 PM Unk Pinto, MD [facility] None  09/13/2020  1:45 PM Megan Salon, MD [facility]  11/05/2020   CPE 10:00 AM Liane Comber, NP [facility] None  07/11/2021   Wellness 10:00 AM Liane Comber, NP [facility] None   History of Present Illness:     Patient presents with 4-5 day prodrome of increasing discomfort in her mouth & sore throat.  Denies fever, chills, sweats, congestion, dyspnea, post nasal drainage or rash.   Medications     estradiol (VIVELLE-DOT) 0.025 MG/24HR, Place 1 patch onto the skin 2 (two) times a week.    vitamin B-12 (CYANOCOBALAMIN) 100 MCG tablet, Take 100 mcg by mouth daily.    Ascorbic Acid (VITAMIN C) 100 MG tablet, Take 100 mg by mouth daily.   Biotin 1 MG CAPS, Take 1 capsule by mouth daily.    calcium carbonate (OS-CAL - DOSED IN MG OF ELEMENTAL CALCIUM) 1250 (500 Ca) MG tablet, Take 1 tablet by mouth.   VITAMIN D 1000 UNITS , Take 3,000 Units  daily.    GABAPENTIN  Take 200 mg by mouth at bedtime.   omeprazole 20 MG capsule, TAKE 1 CAPSULE DAILY TO PREVENT HEARTBURN & INDIGESTION   Probiotic Product (PROBIOTIC PO), Take by mouth daily.   pyridOXINE (VITAMIN B-6) 100 MG tablet, Take 100 mg daily.  Problem list She has Interstitial cystitis; GERD (gastroesophageal reflux disease); Hyperlipidemia; Hypertension; History of prediabetes; Vitamin D deficiency; Aortic atherosclerosis ([place]); and Lumbar back pain with radiculopathy affecting right lower extremity on their problem list.   Observations/Objective:  BP 130/74   Pulse 76   Temp (!) 97.3 F (36.3 C)   Resp 17   Ht 4\' 10"  (1.473 m)   Wt 118 lb 14.4 oz (53.9 kg)   [name] 10/18/2001   SpO2 99%   BMI 24.85 kg/m   HEENT -  EAC's /TM's -nl' N /clear . No sinus tenderness.                  -  O/P  2+ injected with creamy mucosal patches suspect for thrush.                   - Posterior pharynx is injected.  Neck  - supple w/o lymphadenopathy.  Chest - Clear equal BS. Cor - Nl HS. RRR w/o sig MGR. PP 1(+). No edema. MS- FROM w/o deformities.  Gait Nl. Neuro -  Nl w/o focal abnormalities.   Assessment and Plan:  1. Pharyngitis, unspecified etiology  - dexamethasone  2 MG; Take 1 tab 3 x /day for 2 days, then 2 x /day for 2  Days,then 1 tab daily  Disp 13 tablet; Refill: 1 - azithromycin 250 MG;Take 2 tablets with Food on  Day 1, then 1 tablet Daily with Food for Sinusitis / Bronchitis  Disp: 6 each; Refill: 1  2. Stomatitis  - dexamethasone 2 MG ; Take 1 tab 3 x /day for 2 days, then 2 x /day for 2  Days, then 1 tab daily  Dispense: 13 tablet; Refill: 1  3. Candidiasis of mouth  - fluconazole  150 MG; Take  1 tablet  Daily  for Yeast Infection  Disp: 14 tablet   Follow Up Instructions:      I discussed the assessment and treatment plan with the patient. The patient was provided an opportunity to ask questions and all were answered. The patient  agreed with the plan and demonstrated an understanding of the instructions.       The patient was advised to call back or seek an in-person evaluation if the symptoms worsen or if the condition fails to improve as anticipated.   Kirtland Bouchard, MD

## 2020-08-09 ENCOUNTER — Ambulatory Visit: Payer: PPO | Admitting: Internal Medicine

## 2020-08-09 ENCOUNTER — Encounter (HOSPITAL_BASED_OUTPATIENT_CLINIC_OR_DEPARTMENT_OTHER): Payer: Self-pay | Admitting: Obstetrics & Gynecology

## 2020-08-14 ENCOUNTER — Encounter (HOSPITAL_BASED_OUTPATIENT_CLINIC_OR_DEPARTMENT_OTHER): Payer: Self-pay | Admitting: Obstetrics & Gynecology

## 2020-08-14 DIAGNOSIS — M5442 Lumbago with sciatica, left side: Secondary | ICD-10-CM | POA: Diagnosis not present

## 2020-08-15 DIAGNOSIS — S63591A Other specified sprain of right wrist, initial encounter: Secondary | ICD-10-CM | POA: Diagnosis not present

## 2020-08-15 DIAGNOSIS — M25531 Pain in right wrist: Secondary | ICD-10-CM | POA: Diagnosis not present

## 2020-08-27 DIAGNOSIS — M5442 Lumbago with sciatica, left side: Secondary | ICD-10-CM | POA: Diagnosis not present

## 2020-09-03 DIAGNOSIS — M5442 Lumbago with sciatica, left side: Secondary | ICD-10-CM | POA: Diagnosis not present

## 2020-09-06 DIAGNOSIS — Z20822 Contact with and (suspected) exposure to covid-19: Secondary | ICD-10-CM | POA: Diagnosis not present

## 2020-09-11 DIAGNOSIS — M5442 Lumbago with sciatica, left side: Secondary | ICD-10-CM | POA: Diagnosis not present

## 2020-09-13 ENCOUNTER — Ambulatory Visit (INDEPENDENT_AMBULATORY_CARE_PROVIDER_SITE_OTHER): Payer: PPO | Admitting: Obstetrics & Gynecology

## 2020-09-13 ENCOUNTER — Other Ambulatory Visit: Payer: Self-pay

## 2020-09-13 ENCOUNTER — Encounter (HOSPITAL_BASED_OUTPATIENT_CLINIC_OR_DEPARTMENT_OTHER): Payer: Self-pay | Admitting: Obstetrics & Gynecology

## 2020-09-13 VITALS — BP 130/77 | HR 69 | Ht <= 58 in | Wt 114.6 lb

## 2020-09-13 DIAGNOSIS — Z780 Asymptomatic menopausal state: Secondary | ICD-10-CM | POA: Diagnosis not present

## 2020-09-13 DIAGNOSIS — Z90722 Acquired absence of ovaries, bilateral: Secondary | ICD-10-CM | POA: Diagnosis not present

## 2020-09-13 DIAGNOSIS — N3010 Interstitial cystitis (chronic) without hematuria: Secondary | ICD-10-CM | POA: Diagnosis not present

## 2020-09-13 DIAGNOSIS — M8588 Other specified disorders of bone density and structure, other site: Secondary | ICD-10-CM

## 2020-09-13 DIAGNOSIS — Z79890 Hormone replacement therapy (postmenopausal): Secondary | ICD-10-CM | POA: Diagnosis not present

## 2020-09-13 DIAGNOSIS — Z01419 Encounter for gynecological examination (general) (routine) without abnormal findings: Secondary | ICD-10-CM | POA: Diagnosis not present

## 2020-09-13 DIAGNOSIS — Z90710 Acquired absence of both cervix and uterus: Secondary | ICD-10-CM

## 2020-09-13 DIAGNOSIS — Z9079 Acquired absence of other genital organ(s): Secondary | ICD-10-CM

## 2020-09-13 MED ORDER — ESTRADIOL 0.025 MG/24HR TD PTTW: 1.0000 | MEDICATED_PATCH | TRANSDERMAL | 4 refills | Status: DC

## 2020-09-13 NOTE — Progress Notes (Signed)
70 y.o. G10P1102 Married White or Caucasian female here for breast and pelvic exam.  I am also following her for recurrent vaginitis/vulvitis.  Has hx of recurrent BV.  Denies vaginal bleeding.  In marriage counseling.    Patient's last menstrual period was 10/18/2001.          Sexually active: No H/O STD:  no  Health Maintenance: PCP:  Liane Comber.  Last wellness appt was 07/10/2020.  Did blood work at that appt:  yes Vaccines are up to date:  yes Colonoscopy:  07/14/2018 MMG:  07/30/2020 Negative BMD:  07/30/2020, -1.6 Last pap smear: 06/27/2013.   H/o abnormal pap smear:  yes, remote hx   reports that she quit smoking about 33 years ago. Her smoking use included cigarettes. She has a 7.50 pack-year smoking history. She has never used smokeless tobacco. She reports current alcohol use of about 6.0 - 7.0 standard drinks of alcohol per week. She reports that she does not use drugs.  Past Medical History:  Diagnosis Date   Anxiety    Arthritis    in neck   Bursitis of right hip    Carpal tunnel syndrome, bilateral    rt wrist   Complete rotator cuff tear of left shoulder    Eagle's syndrome    GERD (gastroesophageal reflux disease)    Hearing loss of both ears 12/25/2014   Has hearing aids/tubes    Heart murmur    as child   HNP (herniated nucleus pulposus)    Interstitial cystitis    Kidney cysts    Osteopenia    Plantar fasciitis    Spinal stenosis    Tingling    rt foot   Wears hearing aid     Past Surgical History:  Procedure Laterality Date   ABDOMINAL HYSTERECTOMY  2003   BSO   ANAL SPHINCTEROTOMY     CARPAL TUNNEL RELEASE     LEFT   CERVICAL FUSION  02/25/2018   c1-c2 with laminectomy   CESAREAN SECTION     CYSTOSCOPY     HEMORRHOID SURGERY N/A 1998   KNEE ARTHROSCOPY Right 06/22/2019   Dr. Veverly Fells    LUMBAR LAMINECTOMY     LUMBAR LAMINECTOMY/DECOMPRESSION MICRODISCECTOMY N/A 08/09/2014   Procedure: MICRO LUMBAR DECOMPRESSION L3-4,  REDO DECOMPRESSION,  MICRODISCECTOMY L4-5, FORAMINOTOMY L3-L4, L4-L5 BILATERAL;  Surgeon: Susa Day, MD;  Location: [facility];  Service: Orthopedics;  Laterality: N/A;   MYRINGOTOMY     NASAL SINUS SURGERY     TONSILLECTOMY     LEFT only, due to Eagle Syndrome   TONSILLECTOMY  10/14   right side due to Eagles syndrome   TRIGGER FINGER RELEASE     RIGHT   TYMPANOSTOMY TUBE PLACEMENT      Current Outpatient Medications  Medication Sig Dispense Refill   Ascorbic Acid (VITAMIN C) 100 MG tablet Take 100 mg by mouth daily.     Biotin 1 MG CAPS Take 1 capsule by mouth daily.      calcium carbonate (OS-CAL - DOSED IN MG OF ELEMENTAL CALCIUM) 1250 (500 Ca) MG tablet Take 1 tablet by mouth.     cholecalciferol (VITAMIN D) 1000 UNITS tablet Take 3,000 Units by mouth daily.      estradiol (VIVELLE-DOT) 0.025 MG/24HR Place 1 patch onto the skin 2 (two) times a week. 24 patch 4   GABAPENTIN PO Take 200 mg by mouth at bedtime.     omeprazole (PRILOSEC) 20 MG capsule TAKE 1 CAPSULE DAILY TO PREVENT  HEARTBURN & INDIGESTION 90 capsule 1   Probiotic Product (PROBIOTIC PO) Take by mouth daily.     pyridOXINE (VITAMIN B-6) 100 MG tablet Take 100 mg by mouth daily.     vitamin B-12 (CYANOCOBALAMIN) 100 MCG tablet Take 100 mcg by mouth daily.     azithromycin (ZITHROMAX) 250 MG tablet Take 2 tablets with Food on  Day 1, then 1 tablet Daily with Food for Sinusitis / Bronchitis (Patient not taking: Reported on 09/13/2020) 6 each 1   dexamethasone (DECADRON) 2 MG tablet Take 1 tab 3 x /day for 2 days,      then 2 x /day for 2  Days,     then 1 tab daily (Patient not taking: Reported on 09/13/2020) 13 tablet 1   fluconazole (DIFLUCAN) 150 MG tablet Take  1 tablet  Daily  for Yeast Infection (Patient not taking: Reported on 09/13/2020) 14 tablet 0   No current facility-administered medications for this visit.    Family History  Problem Relation Age of Onset   Diabetes Mother    Hypertension Mother    Hyperlipidemia Mother     Osteoporosis Mother    Hypertension Brother    Hyperlipidemia Brother    Prostate cancer Father        Bone    Review of Systems  Constitutional: Negative.   Gastrointestinal: Negative.   Genitourinary: Negative.    Exam:   BP 130/77 (BP Location: Right Arm, Patient Position: Sitting, Cuff Size: Small)   Pulse 69   Ht 4' 9.5" (1.461 m)   Wt 114 lb 9.6 oz (52 kg)   [name] 10/18/2001   BMI 24.37 kg/m   Height: 4' 9.5" (146.1 cm)  General appearance: alert, cooperative and appears stated age Breasts: normal appearance, no masses or tenderness Abdomen: soft, non-tender; bowel sounds normal; no masses,  no organomegaly Lymph nodes: Cervical, supraclavicular, and axillary nodes normal.  No abnormal inguinal nodes palpated Neurologic: Grossly normal  Pelvic: External genitalia:  no lesions              Urethra:  normal appearing urethra with no masses, tenderness or lesions              Bartholins and Skenes: normal                 Vagina: normal appearing vagina with atrophic changes and no discharge, no lesions              Cervix: no lesions              Pap taken: No. Bimanual Exam:  Uterus:  uterus absent              Adnexa: no mass, fullness, tenderness               Rectovaginal: Confirms               Anus:  normal sphincter tone, no lesions  Chaperone, Octaviano Batty, CMA, was present for exam.  Assessment/Plan: 1. Encounter for gynecological examination without abnormal finding - pap not indicated - BMD done 2022 - Colonoscopy 06/2018 - MMG 07/2020 - vaccines updated - lab work done with PCP  2. S/P TAH-BSO  3. Postmenopausal - RF for vivelle dot 0.'025mg'$  patches twice weekly.  #24/3RF  4. Interstitial cystitis - has urologist who follows  5. Hormone replacement therapy  6. Osteopenia of lumbar spine

## 2020-09-17 DIAGNOSIS — M5442 Lumbago with sciatica, left side: Secondary | ICD-10-CM | POA: Diagnosis not present

## 2020-09-27 DIAGNOSIS — M5416 Radiculopathy, lumbar region: Secondary | ICD-10-CM | POA: Diagnosis not present

## 2020-09-27 DIAGNOSIS — R030 Elevated blood-pressure reading, without diagnosis of hypertension: Secondary | ICD-10-CM | POA: Diagnosis not present

## 2020-10-16 ENCOUNTER — Ambulatory Visit: Payer: PPO | Admitting: Nurse Practitioner

## 2020-10-16 NOTE — Progress Notes (Signed)
Assessment and Plan:  Austa was seen today for acute visit.  Diagnoses and all orders for this visit:  Pharyngitis, unspecified etiology -     azithromycin (ZITHROMAX) 250 MG tablet; Take 2 tablets with Food on  Day 1, then 1 tablet Daily with Food for Sinusitis / Bronchitis  Continue Saline rinses, claritin or Zyrtec as needed, can also use Mucinex DM  Encounter for screening for COVID-19 -     POC COVID-19      Further disposition pending results of labs. Discussed med's effects and SE's.   Over 30 minutes of exam, counseling, chart review, and critical decision making was performed.   Future Appointments  Date Time Provider [place]  11/20/2020  9:00 AM Magda Bernheim, NP [facility] None  07/11/2021 10:00 AM Liane Comber, NP [facility] None  10/07/2021 10:15 AM Megan Salon, MD [facility]    ------------------------------------------------------------------------------------------------------------------   HPI BP 118/68   Pulse 78   Temp 97.7 F (36.5 C)   Resp 17   Ht 4' 9.5" (1.461 m)   Wt 116 lb 12.8 oz (53 kg)   [name] 10/18/2001   SpO2 98%   BMI 24.84 kg/m  70 y.o.female presents for evaluation of sore throat and hoarseness, coughing yellow /green mucus x 3 days.  Sore throat worse at night.  Does have some myalgias.  Denies fever, nausea, vomiting , diarrhea.  Past Medical History:  Diagnosis Date   Anxiety    Arthritis    in neck   Bursitis of right hip    Carpal tunnel syndrome, bilateral    rt wrist   Complete rotator cuff tear of left shoulder    Eagle's syndrome    GERD (gastroesophageal reflux disease)    Hearing loss of both ears 12/25/2014   Has hearing aids/tubes    Heart murmur    as child   HNP (herniated nucleus pulposus)    Interstitial cystitis    Kidney cysts    Osteopenia    Plantar fasciitis    Spinal stenosis    Tingling    rt foot   Wears hearing aid      Allergies  Allergen Reactions   Other     Band-aid    Codeine Hives   Sulfa Antibiotics Nausea And Vomiting    Current Outpatient Medications on File Prior to Visit  Medication Sig   Ascorbic Acid (VITAMIN C) 100 MG tablet Take 100 mg by mouth daily.   Biotin 1 MG CAPS Take 1 capsule by mouth daily.    calcium carbonate (OS-CAL - DOSED IN MG OF ELEMENTAL CALCIUM) 1250 (500 Ca) MG tablet Take 1 tablet by mouth.   cholecalciferol (VITAMIN D) 1000 UNITS tablet Take 3,000 Units by mouth daily.    estradiol (VIVELLE-DOT) 0.025 MG/24HR Place 1 patch onto the skin 2 (two) times a week.   GABAPENTIN PO Take 200 mg by mouth at bedtime.   omeprazole (PRILOSEC) 20 MG capsule TAKE 1 CAPSULE DAILY TO PREVENT HEARTBURN & INDIGESTION   Probiotic Product (PROBIOTIC PO) Take by mouth daily.   pyridOXINE (VITAMIN B-6) 100 MG tablet Take 100 mg by mouth daily.   vitamin B-12 (CYANOCOBALAMIN) 100 MCG tablet Take 100 mcg by mouth daily.   azithromycin (ZITHROMAX) 250 MG tablet Take 2 tablets with Food on  Day 1, then 1 tablet Daily with Food for Sinusitis / Bronchitis (Patient not taking: No sig reported)   dexamethasone (DECADRON) 2 MG tablet Take 1 tab 3 x /  day for 2 days,      then 2 x /day for 2  Days,     then 1 tab daily (Patient not taking: No sig reported)   fluconazole (DIFLUCAN) 150 MG tablet Take  1 tablet  Daily  for Yeast Infection (Patient not taking: No sig reported)   No current facility-administered medications on file prior to visit.    ROS: all negative except above.   Physical Exam:  BP 118/68   Pulse 78   Temp 97.7 F (36.5 C)   Resp 17   Ht 4' 9.5" (1.461 m)   Wt 116 lb 12.8 oz (53 kg)   [name] 10/18/2001   SpO2 98%   BMI 24.84 kg/m   General Appearance: Well nourished, in no apparent distress. Eyes: PERRLA, EOMs, conjunctiva no swelling or erythema Sinuses: No Frontal/maxillary tendernessNo erythema, swelling, or exudate on post pharynx.  Tonsils not swollen or erythematous. Hearing normal with hearing aids. Post pharynx  erythematous, no white spots notes, clear post nasal drip ENT/Mouth: Ext aud canals clear, TMs without erythema, bulging.  Neck: Supple, thyroid normal.  Respiratory: Respiratory effort normal, BS equal bilaterally without rales, rhonchi, wheezing or stridor.  Cardio: RRR with no MRGs. Brisk peripheral pulses without edema.  Abdomen: Soft, + BS.  Non tender, no guarding, rebound, hernias, masses. Lymphatics: + left submandibular ademopathy Musculoskeletal: Full ROM, 5/5 strength, normal gait.  Skin: Warm, dry without rashes, lesions, ecchymosis.  Neuro: Cranial nerves intact. Normal muscle tone, no cerebellar symptoms. Sensation intact.  Psych: Awake and oriented X 3, normal affect, Insight and Judgment appropriate.     Magda Bernheim, NP 11:48 AM [facility]

## 2020-10-17 ENCOUNTER — Ambulatory Visit (INDEPENDENT_AMBULATORY_CARE_PROVIDER_SITE_OTHER): Payer: PPO | Admitting: Nurse Practitioner

## 2020-10-17 ENCOUNTER — Encounter: Payer: Self-pay | Admitting: Nurse Practitioner

## 2020-10-17 ENCOUNTER — Other Ambulatory Visit: Payer: Self-pay

## 2020-10-17 VITALS — BP 118/68 | HR 78 | Temp 97.7°F | Resp 17 | Ht <= 58 in | Wt 116.8 lb

## 2020-10-17 DIAGNOSIS — J029 Acute pharyngitis, unspecified: Secondary | ICD-10-CM

## 2020-10-17 DIAGNOSIS — Z1152 Encounter for screening for COVID-19: Secondary | ICD-10-CM | POA: Diagnosis not present

## 2020-10-17 LAB — POC COVID19 BINAXNOW: SARS Coronavirus 2 Ag: NEGATIVE

## 2020-10-17 MED ORDER — AZITHROMYCIN 250 MG PO TABS: ORAL_TABLET | ORAL | 1 refills | Status: DC

## 2020-10-17 NOTE — Patient Instructions (Signed)
Pharyngitis °Pharyngitis is inflammation of the throat (pharynx). It is a very common cause of sore throat. Pharyngitis can be caused by a bacteria, but it is usually caused by a virus. Most cases of pharyngitis get better on their own without treatment. °What are the causes? °This condition may be caused by: °Infection by viruses (viral). Viral pharyngitis spreads easily from person to person (is contagious) through coughing, sneezing, and sharing of personal items or utensils such as cups, forks, spoons, and toothbrushes. °Infection by bacteria (bacterial). Bacterial pharyngitis may be spread by touching the nose or face after coming in contact with the bacteria, or through close contact, such as kissing. °Allergies. Allergies can cause buildup of mucus in the throat (post-nasal drip), leading to inflammation and irritation. Allergies can also cause blocked nasal passages, forcing breathing through the mouth, which dries and irritates the throat. °What increases the risk? °You are more likely to develop this condition if: °You are 5-24 years old. °You are exposed to crowded environments such as daycare, school, or dormitory living. °You live in a cold climate. °You have a weakened disease-fighting (immune) system. °What are the signs or symptoms? °Symptoms of this condition vary by the cause. Common symptoms of this condition include: °Sore throat. °Fatigue. °Low-grade fever. °Stuffy nose (nasal congestion) and cough. °Headache. °Other symptoms may include: °Glands in the neck (lymph nodes) that are swollen. °Skin rashes. °Plaque-like film on the throat or tonsils. This is often a symptom of bacterial pharyngitis. °Vomiting. °Red, itchy eyes (conjunctivitis). °Loss of appetite. °Joint pain and muscle aches. °Enlarged tonsils. °How is this diagnosed? °This condition may be diagnosed based on your medical history and a physical exam. Your health care provider will ask you questions about your illness and your  symptoms. °A swab of your throat may be done to check for bacteria (rapid strep test). Other lab tests may also be done, depending on the suspected cause, but these are rare. °How is this treated? °Many times, treatment is not needed for this condition. Pharyngitis usually gets better in 3-4 days without treatment. °Bacterial pharyngitis may be treated with antibiotic medicines. °Follow these instructions at home: °Medicines °Take over-the-counter and prescription medicines only as told by your health care provider. °If you were prescribed an antibiotic medicine, take it as told by your health care provider. Do not stop taking the antibiotic even if you start to feel better. °Use throat sprays to soothe your throat as told by your health care provider. °Children can get pharyngitis. Do not give your child aspirin because of the association with Reye's syndrome. °Managing pain °To help with pain, try: °Sipping warm liquids, such as broth, herbal tea, or warm water. °Eating or drinking cold or frozen liquids, such as frozen ice pops. °Gargling with a mixture of salt and water 3-4 times a day or as needed. To make salt water, completely dissolve ½-1 tsp (3-6 g) of salt in 1 cup (237 mL) of warm water. °Sucking on hard candy or throat lozenges. °Putting a cool-mist humidifier in your bedroom at night to moisten the air. °Sitting in the bathroom with the door closed for 5-10 minutes while you run hot water in the shower. ° °General instructions ° °Do not use any products that contain nicotine or tobacco. These products include cigarettes, chewing tobacco, and vaping devices, such as e-cigarettes. If you need help quitting, ask your health care provider. °Rest as told by your health care provider. °Drink enough fluid to keep your urine pale yellow. °How   is this prevented? °To help prevent becoming infected or spreading infection: °Wash your hands often with soap and water for at least 20 seconds. If soap and water are not  available, use hand sanitizer. °Do not touch your eyes, nose, or mouth with unwashed hands, and wash hands after touching these areas. °Do not share cups or eating utensils. °Avoid close contact with people who are sick. °Contact a health care provider if: °You have large, tender lumps in your neck. °You have a rash. °You cough up green, yellow-brown, or bloody mucus. °Get help right away if: °Your neck becomes stiff. °You drool or are unable to swallow liquids. °You cannot drink or take medicines without vomiting. °You have severe pain that does not go away, even after you take medicine. °You have trouble breathing, and it is not caused by a stuffy nose. °You have new pain and swelling in your joints such as the knees, ankles, wrists, or elbows. °These symptoms may represent a serious problem that is an emergency. Do not wait to see if the symptoms will go away. Get medical help right away. Call your local emergency services (911 in the [place] Do not drive yourself to the hospital. °Summary °Pharyngitis is redness, pain, and swelling (inflammation) of the throat (pharynx). °While pharyngitis can be caused by a bacteria, the most common causes are viral. °Most cases of pharyngitis get better on their own without treatment. °Bacterial pharyngitis is treated with antibiotic medicines. °This information is not intended to replace advice given to you by your health care provider. Make sure you discuss any questions you have with your health care provider. °Document Revised: 05/02/2020 Document Reviewed: 05/02/2020 °[employer]vier Patient Education © 2022 [employer] ° °

## 2020-11-05 ENCOUNTER — Encounter: Payer: PPO | Admitting: Adult Health

## 2020-11-07 DIAGNOSIS — S63591A Other specified sprain of right wrist, initial encounter: Secondary | ICD-10-CM | POA: Diagnosis not present

## 2020-11-07 DIAGNOSIS — M65352 Trigger finger, left little finger: Secondary | ICD-10-CM | POA: Diagnosis not present

## 2020-11-07 DIAGNOSIS — R2231 Localized swelling, mass and lump, right upper limb: Secondary | ICD-10-CM | POA: Diagnosis not present

## 2020-11-07 DIAGNOSIS — M25531 Pain in right wrist: Secondary | ICD-10-CM | POA: Diagnosis not present

## 2020-11-07 DIAGNOSIS — M65342 Trigger finger, left ring finger: Secondary | ICD-10-CM | POA: Diagnosis not present

## 2020-11-15 ENCOUNTER — Other Ambulatory Visit: Payer: Self-pay | Admitting: Otolaryngology

## 2020-11-15 DIAGNOSIS — J320 Chronic maxillary sinusitis: Secondary | ICD-10-CM | POA: Diagnosis not present

## 2020-11-15 DIAGNOSIS — J329 Chronic sinusitis, unspecified: Secondary | ICD-10-CM

## 2020-11-15 DIAGNOSIS — J310 Chronic rhinitis: Secondary | ICD-10-CM | POA: Diagnosis not present

## 2020-11-19 NOTE — Progress Notes (Signed)
COMPLETE PHYSICAL EXAMINATION  Encounter for General Medical Examination with Abnormal Findings 1 year  Essential hypertension No medications at this time Monitor blood pressure  Continue DASH diet.   Reminder to go to the ER if any CP, SOB, nausea, dizziness, severe HA, changes vision/speech, left arm numbness and tingling and jaw pain. CBC CMP  Gastroesophageal reflux disease, esophagitis presence not specified Taking omeprazole in AM and reports well controlled Discussed diet, avoiding triggers and other lifestyle changes  Hx of prediabetes A1Cs well controlled in recent years Continue diet and exercise A1C  Hyperlipidemia, mixed Moderate elevations wants to continue to try lifestyle modifications before initiating medication Discussed dietary and exercise modifications Check lipids, plan to initiate med if LDL remains significantly elevated near 130 CMP  Pain of Maxillary Sinus Has CT of head scheduled Continue to follow with ENT and dentist  Medication management CMP   Interstitial cystitis  managing with lifestyle, follows urology  Aortic atherosclerosis ([place]) Control blood pressure, cholesterol, glucose, increase exercise.   Alcohol use Has been cutting back dramatically, using only on weekends, trying to limit Will try to cut back with therapist and using hot tea/substitue No history of DT's/withdrawals Continue therapy  Medication management -     Magnesium  Vitamin D deficiency -     VITAMIN D 25 Hydroxy (Vit-D Deficiency, Fractures) Continue Vit D supplementation  Screening for ischemic heart disease EKG  Screening for hematuria/proteinuria Microalbumin/creatinine urine ratio Routine U/A with reflex microscopic  I provided 40 minutes of non-face-to-face time during this encounter including counseling, chart review, and critical decision making was preformed.   Future Appointments  Date Time Provider [place]  11/27/2020  2:20 PM  GI-315 CT 1 GI-315CT [facility] [facility]  07/11/2021 10:00 AM Liane Comber, NP [facility] None  10/07/2021 10:15 AM Megan Salon, MD [facility]  11/20/2021  9:00 AM Magda Bernheim, NP [facility] None      Subjective:  Helen Glover is a 70 y.o. female who presents for Medicare Annual Wellness Visit and follow up for HTN, hyperlipidemia, prediabetes, GERD and vitamin D Def.   Had dental implant in May. Having pain in right sinus since having implant. Is scheduled to have CT. Following with ENT and dentist. Just finished Course of Amoxicillin. Does have clear nasal drainage consistently x many years.  Has not used antihistamine.  Drinking alcohol and therapist.  Had 3 drinks on Saturday, drinking has improved. She is going through marriage counseling currently. Work extremely important to husband.   02/25/18 patient had C1-C2 posterior laminectomy and fusion by Dr Matthew Saras, doing well since.  She has spinal stenosis with radiculopathy followed by Dr. Brien Few, getting injections with immense benefit. Follows with Dr. Veverly Fells for knee, had R knee arthroscopy 06/22/2019. Right knee is much improved.   She is taking omeprazole 20mg  in the morning.  She is also taking a probiotic tablet daily.  She had hysterectomy and continues on low dose estradiol patch, follows with GYN.   Interstitial cystitis - patient states mild now that lower stress since retired, managing with lifestyle only.   BMI is Body mass index is 24.41 kg/m., she has been working on diet and exercise. Wt Readings from Last 3 Encounters:  11/20/20 116 lb 12.8 oz (53 kg)  10/17/20 116 lb 12.8 oz (53 kg)  09/13/20 114 lb 9.6 oz (52 kg)   She does not check BP at home, not on any meds, today their BP is BP: 130/80 BP Readings from  Last 3 Encounters:  11/20/20 130/80  10/17/20 118/68  09/13/20 130/77     She does not workout. She denies chest pain, shortness of breath, dizziness.  She is not on cholesterol medication.  Her  cholesterol is not at goal. The cholesterol last visit was:   Lab Results  Component Value Date   CHOL 234 (H) 07/10/2020   HDL 92 07/10/2020   LDLCALC 124 (H) 07/10/2020   TRIG 81 07/10/2020   CHOLHDL 2.5 07/10/2020   She has been working on diet and exercise for hx of prediabetes (A1C 5.7% in 2014), and denies hyperglycemia, hypoglycemia , increased appetite, nausea, paresthesia of the feet, polydipsia, polyuria, visual disturbances, vomiting and weight loss. Last A1C in the office was:  Lab Results  Component Value Date   HGBA1C 5.5 10/31/2019   Last GFR Lab Results  Component Value Date   GFRNONAA 95 07/10/2020   Patient is on Vitamin D supplement.   Lab Results  Component Value Date   VD25OH 62 07/10/2020      Medication Review:  Current Outpatient Medications (Endocrine & Metabolic):    estradiol (VIVELLE-DOT) 0.025 MG/24HR, Place 1 patch onto the skin 2 (two) times a week.     Current Outpatient Medications (Hematological):    vitamin B-12 (CYANOCOBALAMIN) 100 MCG tablet, Take 100 mcg by mouth daily.  Current Outpatient Medications (Other):    Ascorbic Acid (VITAMIN C) 100 MG tablet, Take 100 mg by mouth daily.   Biotin 1 MG CAPS, Take 1 capsule by mouth daily.    calcium carbonate (OS-CAL - DOSED IN MG OF ELEMENTAL CALCIUM) 1250 (500 Ca) MG tablet, Take 1 tablet by mouth.   cholecalciferol (VITAMIN D) 1000 UNITS tablet, Take 3,000 Units by mouth daily.    GABAPENTIN PO, Take 200 mg by mouth at bedtime.   omeprazole (PRILOSEC) 20 MG capsule, TAKE 1 CAPSULE DAILY TO PREVENT HEARTBURN & INDIGESTION   Probiotic Product (PROBIOTIC PO), Take by mouth daily.   pyridOXINE (VITAMIN B-6) 100 MG tablet, Take 100 mg by mouth daily.   azithromycin (ZITHROMAX) 250 MG tablet, Take 2 tablets with Food on  Day 1, then 1 tablet Daily with Food for Sinusitis / Bronchitis  Allergies: Allergies  Allergen Reactions   Other     Band-aid   Codeine Hives   Sulfa Antibiotics Nausea  And Vomiting    Current Problems (verified) has Interstitial cystitis; GERD (gastroesophageal reflux disease); Hyperlipidemia; Hypertension; History of prediabetes; Vitamin D deficiency; Aortic atherosclerosis ([place]); and Lumbar back pain with radiculopathy affecting right lower extremity on their problem list.  Screening Tests Health Maintenance  Topic Date Due   TETANUS/TDAP  09/23/2020   MAMMOGRAM  07/30/2021   COLONOSCOPY (Pts 45-13yrs Insurance coverage will need to be confirmed)  07/14/2023   INFLUENZA VACCINE  Completed   DEXA SCAN  Completed   COVID-19 Vaccine  Completed   Hepatitis C Screening  Completed   Zoster Vaccines- Shingrix  Completed   HPV VACCINES  Aged Out    Last colonoscopy: 06/2018 Dr. Paulita Fujita due 5 years Last mammogram: 07/30/20 [facility] Last pap smear/pelvic exam: 06/27/2013 Dr. Sabra Heck, DONE paps, TAH, pelvic only DEXA: 07/30/20 mild osteopenia AB [place] 10/2016 CT AB 07/2018 - aortic atherosclerosis    Prior vaccinations: TD or Tdap: 2012  Influenza: 10/2018  Pneumococcal: 12/25/2016 Prevnar13: 2015 Shingles/Zostavax: 2007 Shingrix 2018, reports [facility] Covid 19: 2/2, 2021, pfizer   Names of Other Physician/Practitioners you currently use: 1. [facility] here for primary care 2. Donato Heinz, eye doctor, last visit 2020, wears glasses, scheduled in June 2021 3. Dr. Toy Cookey, dentist, last visit 2021  Patient Care Team: Unk Pinto, MD as PCP - General (Internal Medicine) Barbaraann Cao, OD as Referring Physician (Optometry) Marlou Sa, Tonna Corner, MD as Consulting Physician (Orthopedic Surgery) Berenice Primas, MD as Referring Physician (Orthopedic Surgery) Megan Salon, MD as Consulting Physician (Gynecology) Ardis Hughs, MD as Attending Physician (Urology) Leta Baptist, MD as Consulting Physician (Otolaryngology) Arta Silence, MD as Consulting Physician (Gastroenterology)  Surgical: She   has a past surgical history that includes Cesarean section; Myringotomy; Lumbar laminectomy; Carpal tunnel release; Trigger finger release; Anal sphincterotomy; Nasal sinus surgery; Abdominal hysterectomy (2003); Hemorrhoid surgery (N/A, 1998); Cystoscopy; Tympanostomy tube placement; Tonsillectomy; Tonsillectomy (10/14); Lumbar laminectomy/decompression microdiscectomy (N/A, 08/09/2014); Cervical fusion (02/25/2018); and Knee arthroscopy (Right, 06/22/2019). Family Her family history includes Diabetes in her mother; Hyperlipidemia in her brother and mother; Hypertension in her brother and mother; Osteoporosis in her mother; Prostate cancer in her father. Social history  She reports that she quit smoking about 33 years ago. Her smoking use included cigarettes. She has a 7.50 pack-year smoking history. She has never used smokeless tobacco. She reports current alcohol use of about 6.0 - 7.0 standard drinks per week. She reports that she does not use drugs.  Review of Systems  Constitutional:  Positive for malaise/fatigue. Negative for chills and fever.  HENT:  Positive for congestion and sinus pain (right maxillary, having CT). Negative for hearing loss, sore throat and tinnitus.   Eyes:  Negative for blurred vision and double vision.  Respiratory:  Negative for cough, hemoptysis, sputum production, shortness of breath and wheezing.   Cardiovascular:  Negative for chest pain, palpitations and leg swelling.  Gastrointestinal:  Positive for heartburn (uses omeprazole). Negative for abdominal pain, constipation, diarrhea, nausea and vomiting.       Had loose stools which is improving  Genitourinary:  Negative for dysuria and urgency.  Musculoskeletal:  Negative for back pain, falls, joint pain, myalgias and neck pain.  Skin:  Negative for rash.  Neurological:  Negative for dizziness, tingling, tremors, weakness and headaches.  Endo/Heme/Allergies:  Does not bruise/bleed easily.  Psychiatric/Behavioral:   Negative for depression and suicidal ideas. The patient is not nervous/anxious and does not have insomnia.    Objective:   Today's Vitals   11/20/20 0853  BP: 130/80  Pulse: 67  Temp: (!) 97.5 F (36.4 C)  SpO2: 99%  Weight: 116 lb 12.8 oz (53 kg)  Height: 4\' 10"  (1.473 m)    Body mass index is 24.41 kg/m.  General appearance: alert, no distress, WD/WN, female HEENT: normocephalic, sclerae anicteric, right TM no bulging, left TM with perforation, nares patent, no discharge or erythema, pharynx normal Oral cavity: MMM, no lesions Sinuses: right maxillary tenderness Neck: supple, no lymphadenopathy, no thyromegaly, no masses Heart: RRR, normal S1, S2, no murmurs Lungs: CTA bilaterally, no wheezes, rhonchi, or rales Breast: defer to GYN Abdomen: +bs, soft, non tender, non distended, no masses, no hepatomegaly, no splenomegaly Musculoskeletal: nontender, no swelling, no obvious deformity, Left palmar hand at 4th and 5th digit, hard fibrous cord with no obvious restriction/decreased strength on exam.  Extremities: no edema, no cyanosis, no clubbing Pulses: 2+ symmetric, upper and lower extremities, normal cap refill Neurological: alert, oriented x 3, CN2-12 intact, strength normal upper extremities and lower extremities, sensation normal throughout, DTRs 2+ throughout, no cerebellar signs, gait normal GU: defer to GYN  Psychiatric: normal affect, behavior normal, pleasant  EKG: IRBBB no ST changes   Sehaj Kolden Kathyrn Drown, NP 9:04 AM [facility]

## 2020-11-20 ENCOUNTER — Encounter: Payer: Self-pay | Admitting: Nurse Practitioner

## 2020-11-20 ENCOUNTER — Other Ambulatory Visit: Payer: Self-pay

## 2020-11-20 ENCOUNTER — Ambulatory Visit (INDEPENDENT_AMBULATORY_CARE_PROVIDER_SITE_OTHER): Payer: PPO | Admitting: Nurse Practitioner

## 2020-11-20 VITALS — BP 130/80 | HR 67 | Temp 97.5°F | Ht <= 58 in | Wt 116.8 lb

## 2020-11-20 DIAGNOSIS — R7309 Other abnormal glucose: Secondary | ICD-10-CM | POA: Diagnosis not present

## 2020-11-20 DIAGNOSIS — I700 Atherosclerosis of aorta: Secondary | ICD-10-CM

## 2020-11-20 DIAGNOSIS — E785 Hyperlipidemia, unspecified: Secondary | ICD-10-CM | POA: Diagnosis not present

## 2020-11-20 DIAGNOSIS — Z0000 Encounter for general adult medical examination without abnormal findings: Secondary | ICD-10-CM | POA: Diagnosis not present

## 2020-11-20 DIAGNOSIS — I10 Essential (primary) hypertension: Secondary | ICD-10-CM | POA: Diagnosis not present

## 2020-11-20 DIAGNOSIS — N3010 Interstitial cystitis (chronic) without hematuria: Secondary | ICD-10-CM

## 2020-11-20 DIAGNOSIS — Z136 Encounter for screening for cardiovascular disorders: Secondary | ICD-10-CM | POA: Diagnosis not present

## 2020-11-20 DIAGNOSIS — K219 Gastro-esophageal reflux disease without esophagitis: Secondary | ICD-10-CM

## 2020-11-20 DIAGNOSIS — E559 Vitamin D deficiency, unspecified: Secondary | ICD-10-CM | POA: Diagnosis not present

## 2020-11-20 DIAGNOSIS — J3489 Other specified disorders of nose and nasal sinuses: Secondary | ICD-10-CM

## 2020-11-20 DIAGNOSIS — Z1389 Encounter for screening for other disorder: Secondary | ICD-10-CM

## 2020-11-20 DIAGNOSIS — Z79899 Other long term (current) drug therapy: Secondary | ICD-10-CM

## 2020-11-20 DIAGNOSIS — M25561 Pain in right knee: Secondary | ICD-10-CM

## 2020-11-20 DIAGNOSIS — Z0001 Encounter for general adult medical examination with abnormal findings: Secondary | ICD-10-CM

## 2020-11-20 NOTE — Patient Instructions (Signed)

## 2020-11-21 LAB — COMPLETE METABOLIC PANEL WITH GFR
AG Ratio: 2 (calc) (ref 1.0–2.5)
ALT: 15 U/L (ref 6–29)
AST: 20 U/L (ref 10–35)
Albumin: 4.4 g/dL (ref 3.6–5.1)
Alkaline phosphatase (APISO): 64 U/L (ref 37–153)
BUN: 14 mg/dL (ref 7–25)
CO2: 28 mmol/L (ref 20–32)
Calcium: 9.1 mg/dL (ref 8.6–10.4)
Chloride: 102 mmol/L (ref 98–110)
Creat: 0.6 mg/dL (ref 0.50–1.05)
Globulin: 2.2 g/dL (calc) (ref 1.9–3.7)
Glucose, Bld: 77 mg/dL (ref 65–99)
Potassium: 4.1 mmol/L (ref 3.5–5.3)
Sodium: 138 mmol/L (ref 135–146)
Total Bilirubin: 0.5 mg/dL (ref 0.2–1.2)
Total Protein: 6.6 g/dL (ref 6.1–8.1)
eGFR: 97 mL/min/{1.73_m2} (ref >=60)

## 2020-11-21 LAB — CBC WITH DIFFERENTIAL/PLATELET
Absolute Monocytes: 448 cells/uL (ref 200–950)
Basophils Absolute: 30 cells/uL (ref 0–200)
Basophils Relative: 0.5 %
Eosinophils Absolute: 89 cells/uL (ref 15–500)
Eosinophils Relative: 1.5 %
HCT: 39.7 % (ref 35.0–45.0)
Hemoglobin: 13.2 g/dL (ref 11.7–15.5)
Lymphs Abs: 1463 cells/uL (ref 850–3900)
MCH: 31.7 pg (ref 27.0–33.0)
MCHC: 33.2 g/dL (ref 32.0–36.0)
MCV: 95.2 fL (ref 80.0–100.0)
MPV: 12.4 fL (ref 7.5–12.5)
Monocytes Relative: 7.6 %
Neutro Abs: 3870 cells/uL (ref 1500–7800)
Neutrophils Relative %: 65.6 %
Platelets: 241 10*3/uL (ref 140–400)
RBC: 4.17 10*6/uL (ref 3.80–5.10)
RDW: 12.8 % (ref 11.0–15.0)
Total Lymphocyte: 24.8 %
WBC: 5.9 10*3/uL (ref 3.8–10.8)

## 2020-11-21 LAB — MICROALBUMIN / CREATININE URINE RATIO
Creatinine, Urine: 29 mg/dL (ref 20–275)
Microalb, Ur: <0.2 mg/dL

## 2020-11-21 LAB — LIPID PANEL
Cholesterol: 233 mg/dL — ABNORMAL HIGH (ref <200)
HDL: 97 mg/dL (ref >=50)
LDL Cholesterol (Calc): 121 mg/dL (calc) — ABNORMAL HIGH
Non-HDL Cholesterol (Calc): 136 mg/dL (calc) — ABNORMAL HIGH (ref <130)
Total CHOL/HDL Ratio: 2.4 (calc) (ref <5.0)
Triglycerides: 63 mg/dL (ref <150)

## 2020-11-21 LAB — URINALYSIS, ROUTINE W REFLEX MICROSCOPIC
Bilirubin Urine: NEGATIVE
Glucose, UA: NEGATIVE
Hgb urine dipstick: NEGATIVE
Ketones, ur: NEGATIVE
Leukocytes,Ua: NEGATIVE
Nitrite: NEGATIVE
Protein, ur: NEGATIVE
Specific Gravity, Urine: 1.007 (ref 1.001–1.035)
pH: 6.5 (ref 5.0–8.0)

## 2020-11-21 LAB — TSH: TSH: 2.04 mIU/L (ref 0.40–4.50)

## 2020-11-21 LAB — MAGNESIUM: Magnesium: 2.1 mg/dL (ref 1.5–2.5)

## 2020-11-21 LAB — VITAMIN D 25 HYDROXY (VIT D DEFICIENCY, FRACTURES): Vit D, 25-Hydroxy: 76 ng/mL (ref 30–100)

## 2020-11-21 LAB — HEMOGLOBIN A1C
Hgb A1c MFr Bld: 5.3 % of total Hgb (ref <5.7)
Mean Plasma Glucose: 105 mg/dL
eAG (mmol/L): 5.8 mmol/L

## 2020-11-27 ENCOUNTER — Ambulatory Visit
Admission: RE | Admit: 2020-11-27 | Discharge: 2020-11-27 | Disposition: A | Discharge status: Home / Self Care | Payer: PPO | Source: Ambulatory Visit | Attending: Otolaryngology | Admitting: Otolaryngology

## 2020-11-27 ENCOUNTER — Other Ambulatory Visit: Payer: Self-pay

## 2020-11-27 DIAGNOSIS — J3489 Other specified disorders of nose and nasal sinuses: Secondary | ICD-10-CM | POA: Diagnosis not present

## 2020-11-27 DIAGNOSIS — J329 Chronic sinusitis, unspecified: Secondary | ICD-10-CM

## 2020-12-10 DIAGNOSIS — J320 Chronic maxillary sinusitis: Secondary | ICD-10-CM | POA: Diagnosis not present

## 2020-12-10 DIAGNOSIS — J343 Hypertrophy of nasal turbinates: Secondary | ICD-10-CM | POA: Diagnosis not present

## 2020-12-10 DIAGNOSIS — J310 Chronic rhinitis: Secondary | ICD-10-CM | POA: Diagnosis not present

## 2020-12-11 DIAGNOSIS — D692 Other nonthrombocytopenic purpura: Secondary | ICD-10-CM | POA: Diagnosis not present

## 2020-12-11 DIAGNOSIS — D1801 Hemangioma of skin and subcutaneous tissue: Secondary | ICD-10-CM | POA: Diagnosis not present

## 2020-12-11 DIAGNOSIS — D225 Melanocytic nevi of trunk: Secondary | ICD-10-CM | POA: Diagnosis not present

## 2020-12-11 DIAGNOSIS — L821 Other seborrheic keratosis: Secondary | ICD-10-CM | POA: Diagnosis not present

## 2020-12-11 DIAGNOSIS — L814 Other melanin hyperpigmentation: Secondary | ICD-10-CM | POA: Diagnosis not present

## 2020-12-11 DIAGNOSIS — L570 Actinic keratosis: Secondary | ICD-10-CM | POA: Diagnosis not present

## 2020-12-27 DIAGNOSIS — M545 Low back pain, unspecified: Secondary | ICD-10-CM | POA: Diagnosis not present

## 2021-01-09 ENCOUNTER — Telehealth: Payer: Self-pay | Admitting: Internal Medicine

## 2021-01-09 NOTE — Chronic Care Management (AMB) (Signed)
  Chronic Care Management   Outreach Note  01/09/2021 Name: Helen Glover MRN: [id number] DOB: 05/22/1950  Referred by: Unk Pinto, MD Reason for referral : No chief complaint on file.   An unsuccessful telephone outreach was attempted today. The patient was referred to the pharmacist for assistance with care management and care coordination.   Follow Up Plan:   Tatjana Dellinger Upstream Scheduler

## 2021-01-12 ENCOUNTER — Other Ambulatory Visit: Payer: Self-pay | Admitting: Adult Health

## 2021-01-12 DIAGNOSIS — K219 Gastro-esophageal reflux disease without esophagitis: Secondary | ICD-10-CM

## 2021-01-13 ENCOUNTER — Encounter: Payer: Self-pay | Admitting: Nurse Practitioner

## 2021-01-14 ENCOUNTER — Other Ambulatory Visit: Payer: Self-pay | Admitting: Nurse Practitioner

## 2021-01-14 DIAGNOSIS — K649 Unspecified hemorrhoids: Secondary | ICD-10-CM

## 2021-01-14 MED ORDER — HYDROCORTISONE ACE-PRAMOXINE 1-1 % EX CREA: 1.0000 "application " | TOPICAL_CREAM | Freq: Two times a day (BID) | CUTANEOUS | 0 refills | Status: DC

## 2021-01-15 ENCOUNTER — Telehealth: Payer: Self-pay | Admitting: Internal Medicine

## 2021-01-15 NOTE — Progress Notes (Signed)
  Chronic Care Management   Note  01/15/2021 Name: Helen Glover MRN: [id number] DOB: Jun 05, 1950  Helen Glover is a 70 y.o. year old female who is a primary care patient of Unk Pinto, MD. I reached out to Tedra Senegal by phone today in response to a referral sent by Ms. Lesleigh Noe Ertel's PCP, Unk Pinto, MD.   Helen Glover was given information about Chronic Care Management services today including:  CCM service includes personalized support from designated clinical staff supervised by her physician, including individualized plan of care and coordination with other care providers 24/7 contact phone numbers for assistance for urgent and routine care needs. Service will only be billed when office clinical staff spend 20 minutes or more in a month to coordinate care. Only one practitioner may furnish and bill the service in a calendar month. The patient may stop CCM services at any time (effective at the end of the month) by phone call to the office staff.   Patient agreed to services and verbal consent obtained.   Follow up plan:   Tatjana Secretary/administrator

## 2021-01-15 NOTE — Chronic Care Management (AMB) (Signed)
  Chronic Care Management   Outreach Note  01/15/2021 Name: Helen Glover MRN: [id number] DOB: [birth year]  Referred by: Unk Pinto, MD Reason for referral : No chief complaint on file.   A second unsuccessful telephone outreach was attempted today. The patient was referred to pharmacist for assistance with care management and care coordination.  Follow Up Plan:   Tatjana Dellinger Upstream Scheduler

## 2021-01-16 ENCOUNTER — Other Ambulatory Visit: Payer: Self-pay | Admitting: Adult Health

## 2021-01-16 ENCOUNTER — Other Ambulatory Visit: Payer: Self-pay | Admitting: Internal Medicine

## 2021-01-16 MED ORDER — DILTIAZEM GEL 2 %: 1.0000 "application " | Freq: Three times a day (TID) | CUTANEOUS | 3 refills | Status: DC

## 2021-01-16 MED ORDER — DILTIAZEM GEL 2 %
1.0000 | Freq: Three times a day (TID) | CUTANEOUS | 3 refills | Status: DC
Start: 2021-01-16 — End: 2021-07-11

## 2021-01-16 MED ORDER — DILTIAZEM GEL 2 %: 1.0000 "application " | Freq: Three times a day (TID) | CUTANEOUS | 1 refills | Status: DC

## 2021-01-28 DIAGNOSIS — M65342 Trigger finger, left ring finger: Secondary | ICD-10-CM | POA: Diagnosis not present

## 2021-02-13 ENCOUNTER — Encounter: Payer: Self-pay | Admitting: Adult Health

## 2021-02-13 ENCOUNTER — Ambulatory Visit (INDEPENDENT_AMBULATORY_CARE_PROVIDER_SITE_OTHER): Payer: PPO | Admitting: Adult Health

## 2021-02-13 ENCOUNTER — Other Ambulatory Visit: Payer: Self-pay

## 2021-02-13 VITALS — BP 138/70 | HR 90 | Temp 97.2°F | Wt 118.0 lb

## 2021-02-13 DIAGNOSIS — J040 Acute laryngitis: Secondary | ICD-10-CM | POA: Diagnosis not present

## 2021-02-13 DIAGNOSIS — Z1152 Encounter for screening for COVID-19: Secondary | ICD-10-CM | POA: Diagnosis not present

## 2021-02-13 DIAGNOSIS — J069 Acute upper respiratory infection, unspecified: Secondary | ICD-10-CM | POA: Diagnosis not present

## 2021-02-13 DIAGNOSIS — R6889 Other general symptoms and signs: Secondary | ICD-10-CM | POA: Diagnosis not present

## 2021-02-13 LAB — POCT INFLUENZA A/B
Influenza A, POC: NEGATIVE
Influenza B, POC: NEGATIVE

## 2021-02-13 LAB — POC COVID19 BINAXNOW: SARS Coronavirus 2 Ag: NEGATIVE

## 2021-02-13 MED ORDER — PROMETHAZINE-DM 6.25-15 MG/5ML PO SYRP: 5.0000 mL | ORAL_SOLUTION | Freq: Four times a day (QID) | ORAL | 1 refills | Status: DC | PRN

## 2021-02-13 MED ORDER — AZITHROMYCIN 250 MG PO TABS: ORAL_TABLET | ORAL | 1 refills | Status: AC

## 2021-02-13 MED ORDER — PREDNISONE 20 MG PO TABS: ORAL_TABLET | ORAL | 0 refills | Status: DC

## 2021-02-13 NOTE — Progress Notes (Signed)
Assessment and Plan:  Helen Glover was seen today for acute visit.  Diagnoses and all orders for this visit:  Viral URI with cough Acute laryngitis Day 5, fairly benign exam Discussed the importance of avoiding unnecessary antibiotic therapy. Suggested symptomatic OTC remedies. Nasal saline spray for congestion. Nasal steroids, allergy pill, oral steroids offered Only start abx if progressive localizing sx, fever, sx lasting longer than 10 days.  Follow up as needed. -     predniSONE (DELTASONE) 20 MG tablet; 2 tablets daily for 3 days, 1 tablet daily for 4 days. -     promethazine-dextromethorphan (PROMETHAZINE-DM) 6.25-15 MG/5ML syrup; Take 5 mLs by mouth 4 (four) times daily as needed for cough.  Flu-like symptoms -     POCT Influenza A/B  Encounter for screening for COVID-19 -     POC COVID-19  Other orders -     azithromycin (ZITHROMAX) 250 MG tablet; Take 2 tablets (500 mg) on  Day 1,  followed by 1 tablet (250 mg) once daily on Days 2 through 5.  Further disposition pending results of labs. Discussed med's effects and SE's.   Over 30 minutes of exam, counseling, chart review, and critical decision making was performed.   Future Appointments  Date Time Provider [place]  02/25/2021  9:00 AM Newton Pigg, [facility] None  07/11/2021 10:00 AM Magda Bernheim, NP [facility] None  10/07/2021 10:15 AM Megan Salon, MD [facility]  11/21/2021  9:00 AM Magda Bernheim, NP [facility] None    ------------------------------------------------------------------------------------------------------------------   HPI BP 138/70    Pulse 90    Temp (!) 97.2 F (36.2 C)    Wt 118 lb (53.5 kg)    [name] 10/18/2001    SpO2 99%    BMI 24.66 kg/m  70 y.o.female presents for evaluation of URI. She had negative rapid flu and covid today prior to being seen.   Her URI sx began 5 days ago with L sided sore throat and nasal congestion, slight headache, fatigued and wiped out. She reports  since congestion has become worse, now with hoarseness and persistent sore throat. She reports has had some generalized aching, has taken some aleve with limited benefit. Has noted mild sense of fever but never checked temp, has resolved.   Has been taking mucinex and decongestant with limited benefit.  Denies watery/itchy eyes, does not some itching. Denies winter allergies.   Past Medical History:  Diagnosis Date   Anxiety    Arthritis    in neck   Bursitis of right hip    Carpal tunnel syndrome, bilateral    rt wrist   Complete rotator cuff tear of left shoulder    Eagle's syndrome    GERD (gastroesophageal reflux disease)    Hearing loss of both ears 12/25/2014   Has hearing aids/tubes    Heart murmur    as child   HNP (herniated nucleus pulposus)    Interstitial cystitis    Kidney cysts    Osteopenia    Plantar fasciitis    Spinal stenosis    Tingling    rt foot   Wears hearing aid      Allergies  Allergen Reactions   Other     Band-aid   Codeine Hives   Sulfa Antibiotics Nausea And Vomiting    Current Outpatient Medications on File Prior to Visit  Medication Sig   Ascorbic Acid (VITAMIN C) 100 MG tablet Take 100 mg by mouth daily.   Biotin 1 MG  CAPS Take 1 capsule by mouth daily.    calcium carbonate (OS-CAL - DOSED IN MG OF ELEMENTAL CALCIUM) 1250 (500 Ca) MG tablet Take 1 tablet by mouth.   cholecalciferol (VITAMIN D) 1000 UNITS tablet Take 3,000 Units by mouth daily.    diltiazem 2 % GEL Apply 1 application topically 3 (three) times daily.   estradiol (VIVELLE-DOT) 0.025 MG/24HR Place 1 patch onto the skin 2 (two) times a week.   GABAPENTIN PO Take 200 mg by mouth at bedtime.   omeprazole (PRILOSEC) 20 MG capsule TAKE 1 CAPSULE DAILY TO PREVENT HEARTBURN & INDIGESTION   pramoxine-hydrocortisone (PROCTOCREAM-HC) 1-1 % rectal cream Place 1 application rectally 2 (two) times daily.   Probiotic Product (PROBIOTIC PO) Take by mouth daily.   pyridOXINE (VITAMIN  B-6) 100 MG tablet Take 100 mg by mouth daily.   vitamin B-12 (CYANOCOBALAMIN) 100 MCG tablet Take 100 mcg by mouth daily.   No current facility-administered medications on file prior to visit.    ROS: all negative except above.   Physical Exam:  BP 138/70    Pulse 90    Temp (!) 97.2 F (36.2 C)    Wt 118 lb (53.5 kg)    [name] 10/18/2001    SpO2 99%    BMI 24.66 kg/m   General Appearance: Well nourished, in no apparent distress. Eyes: PERRLA, EOMs, conjunctiva no swelling or erythema Sinuses: No Frontal/maxillary tenderness ENT/Mouth: Ext aud canals clear, TMs without erythema, bulging. No erythema, swelling, or exudate on post pharynx.  Hoarse vocal quality. Sniffling. Tonsils not swollen or erythematous. Hearing normal.  Neck: Supple Respiratory: Respiratory effort normal, BS equal bilaterally without rales, rhonchi, wheezing or stridor.  Cardio: RRR with no MRGs. Brisk peripheral pulses without edema.  Abdomen: Soft, + BS.  Non tender Lymphatics: Non tender without lymphadenopathy.  Musculoskeletal: normal gait.  Skin: Warm, dry without rashes, lesions, ecchymosis.  Neuro: Normal muscle tone Psych: Awake and oriented X 3, normal affect, Insight and Judgment appropriate.     Helen Ribas, NP 9:15 AM [facility]

## 2021-02-13 NOTE — Patient Instructions (Signed)

## 2021-02-17 DIAGNOSIS — L600 Ingrowing nail: Secondary | ICD-10-CM

## 2021-02-17 HISTORY — DX: Ingrowing nail: L60.0

## 2021-02-19 ENCOUNTER — Ambulatory Visit: Payer: PPO | Admitting: Adult Health

## 2021-02-21 DIAGNOSIS — J310 Chronic rhinitis: Secondary | ICD-10-CM | POA: Diagnosis not present

## 2021-02-21 DIAGNOSIS — J343 Hypertrophy of nasal turbinates: Secondary | ICD-10-CM | POA: Diagnosis not present

## 2021-02-25 ENCOUNTER — Encounter: Payer: Self-pay | Admitting: Adult Health

## 2021-02-25 ENCOUNTER — Ambulatory Visit: Payer: PPO | Admitting: Pharmacist

## 2021-02-25 ENCOUNTER — Encounter: Payer: Self-pay | Admitting: Nurse Practitioner

## 2021-02-25 ENCOUNTER — Other Ambulatory Visit: Payer: Self-pay

## 2021-02-25 DIAGNOSIS — E559 Vitamin D deficiency, unspecified: Secondary | ICD-10-CM

## 2021-02-25 DIAGNOSIS — M8580 Other specified disorders of bone density and structure, unspecified site: Secondary | ICD-10-CM | POA: Insufficient documentation

## 2021-02-25 DIAGNOSIS — E785 Hyperlipidemia, unspecified: Secondary | ICD-10-CM

## 2021-02-25 DIAGNOSIS — M5416 Radiculopathy, lumbar region: Secondary | ICD-10-CM

## 2021-02-25 DIAGNOSIS — I10 Essential (primary) hypertension: Secondary | ICD-10-CM

## 2021-02-25 DIAGNOSIS — Z87898 Personal history of other specified conditions: Secondary | ICD-10-CM

## 2021-02-25 NOTE — Progress Notes (Signed)
Summary: Helen Glover is a pleasant 71 year old Helen Glover who presents for telephonic CCM initial visit. Pt is still recovering from a slight cold and has excessive mucus secretion. She also desires to wean off of Gabapentin due to controlled radiculopathy.  Recommendations/Changes made from today's visit: Most recent Bone density scan revealed mild osteopenia. Recommend adding diagnostic code M85.851, M85.852 - Osteopenia of femoral neck, bilateral to patient diagnosis Encouraged patient to limit alcohol intake Encouraged pt to taper off of gabapentin by taking 100mg  capsule every other night for 1 week, then stop. If you notice any returning neuropathy, reinitiate 100mg  nightly   Patient Visit  Helen Glover, Helen Glover, Helen Glover  DOB: [birth year]  M: [phone] Care Team: Rhys Martini, Olukemi Panchal  __________________________________________________ Chronic Conditions Patient's Chronic Conditions: Hypertension (HTN), Gastroesophageal Reflux Disease (GERD), Hyperlipidemia/Dyslipidemia (HLD), Other, Diabetes (DM), Osteopenia, Vitamin D deficiency, hx of prediabetes, Aortic atherosclerosis  Doctor and Hospital Visits Were there PCP Visits in last 6 months?: Yes Visit #1: 10/17/2020- Marta Lamas, NP (PCP)- Pt. presented for an acute visit for pharyngitis.  Stopped Dexamethasone 2mg  (Completed Course) Fluconazole 150mg  (Completed Course) Visit #2: 11/20/2020-Dana Demetra Shiner, NP- Pt. presented for complete physical.  Stopped Azithromycin 250mg  (Completed Course) Visit #3: 02/13/2021- Pt. presented for acute visit for Viral URI with cough and Acute laryngitis.  Added Azithromycin 250 MG Take 2 tablets (500 mg) on  Day 1,  followed by 1 tablet (250 mg) once daily on Days 2 through 5.  predniSONE 20 MG 2 tablets daily for 3 days, 1 tablet daily for 4 days.  Promethazine-DM 6.25-15 MG/5ML 5 mLs Oral 4 times daily PRN   Were there Specialist Visits in last 6 months?: Yes Visit  #1: 08/27/2020- Unknown Provider- Presented for sciatica. No other information available. Visit #2: 09/03/2020-Unknown Provider- Presented for sciatica. No other information available. Visit #3: 09/06/2020-Restrepo, Cassandra, NP ([facility])- Pt. presented for covid-19 send out test. No medication changes noted. Visit #4: 09/11/2020-Unknown Provider- Presented for sciatica. No other information available. Visit #5: 09/13/2020-Miller, Lemmie Evens, MD (Gynecology)-Pt. presented for breast and pelvic exam, and   also following her for recurrent vaginitis/vulvitis. No medication changes noted. Visit #6: 09/17/2020- Unknown Provider- Presented for sciatica. No other information available. Additional Visits: 09/27/2020-Meyran, Ocie Cornfield (Family Medicine)-Pt. presented w/ elevated BP reading and radiculopathy of the lumbar region. No other information available.  09/27/2020-Meyran NP, Joelene Millin and Willa Frater [facility])- No other information available.  11/07/2020-Gramig, William (Hand Surgery)- No other information available.  11/15/2020-Teoh, Su (Otolaryngology)- No other information available.  12/10/2020-Teoh, Su (Otolaryngology)- No other information available.  12/11/2020-Whitworth, Thayer Jew (Dermatology)- No other information available.  12/27/2020-Meyran NP, Joelene Millin and [place]- No other information available.  Was there a Hospital Visit in last 30 days?: No Were there other Hospital Visits in last 6 months?: No  Medication Information Are there any Medication discrepancies?: No Are there any Medication adherence gaps (beyond 5 days past due)?: No Medication adherence rates for the STAR rating drugs: N/A List Patient's current Care Gaps: No current Care Gaps identified  Disease Assessments Current BP: 138/70 Current HR: 90 taken on: 02/13/2021 Previous BP:  130/80 Previous HR: 67 taken on: 11/19/2020 Weight: 116 BMI: 24.41 Last GFR: 97 taken on: 11/19/2020 Why did the patient present?: CCM initial visit Marital status?: Married Details: Helen Glover 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 Mother is a patient at [employer]. She tries to walk every morning. and soes weight lifting at home every other day or every 3 days. Will sometimes walk in the afternoon. Cooks dinner and during the day purchases groceries for mother. 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 Who does the patient spend their time with and what do they do?: Mother and Husband Lifestyle habits such as diet and exercise?: Diet: Breakfast: yogurt and fruit, yogurt and  toast, oatmeal. eggs scrambled or boiled with a yellow pepper. Lunch: berries, or bean soup with celery and onion. 1-2 pieces of dark chocolate after lunch Dinner: Chicken, fish, tuna. with a veggie on the side Alcohol, tobacco, and illicit drug usage?: Alcohol: 7-8 drinks per week. Goal to decrease alcohol consumption tobacco: quit when daughter 62 Glover Illegal drugs: None What is the patient's sleep pattern?: No sleep issues How many hours per night does patient typically sleep?: 8 hours Patient pleased with health care they are receiving?: Yes Family, occupational, and living circumstances relevant to overall health?: Her family history includes Diabetes in her mother; Hyperlipidemia in her brother and mother; Hypertension in her brother and mother; Osteoporosis in her mother; Prostate cancer in her father. Name and location of Current pharmacy: [id number] [place], [address] Current Rx insurance plan: HTA Are meds synced by current pharmacy?: No Are meds delivered by current  pharmacy?: No - delivery available but patient prefers to not use Would patient benefit from direct intervention of clinical lead in dispensing process to optimize clinical outcomes?: Yes Are [facility] pharmacy services available where patient lives?: Yes Is patient disadvantaged to use [facility]?: No [facility] services reviewed with patient and patient wishes to change pharmacy?: No Select reason patient declined to change pharmacies: Feels capable in managing meds on their own Does patient experience delays in picking up medications due to transportation concerns (getting to pharmacy)?: No Any additional demeanor/mood notes?: Helen Glover is a pleasant 71 year old Helen Glover who presents for telephonic CCM initial visit. Pt is still recovering from a slight cold and has excessive mucus secretion. She also desires to wean off of Gabapentin due to controlled radiculopathy.  Hypertension (HTN) Assess this condition today?: Yes Is patient able to obtain BP reading today?: No Goal: <130/80 mmHG Hypertension Stage: Stage 1 (SBP: 130-139 or DBP: 80-89) Is Patient checking BP at home?: Yes Pt. home BP readings are ranging: Pt does not record BP How often does patient miss taking their blood pressure medications?: No meds Has patient experienced hypotension, dizziness, falls or bradycardia?: No BP RPM device: No We discussed: Targeting 150 minutes of aerobic activity per week, Reducing the amount of salt intake to 1500mg /per day., Limiting the amount of alcohol to 1 or 2 drinks per day., DASH diet:  following a diet emphasizing fruits and vegetables and low-fat dairy products along with whole grains, fish, poultry, and nuts. Reducing red meats and sugars. Assessment:: Controlled HC Follow up: N/A Pharmacist Follow up: Continue to assess BP, Encourage pt to check BP at home at least 1 time a week to ensure home BP readings are normal  Hyperlipidemia/Dyslipidemia (HLD) Last Lipid panel  on: 11/19/2020 TC (Goal<200): 233 LDL: 121 HDL (Goal>40): 97 TG (Goal<150): 63 ASCVD 10-year risk?is:: Intermediate (7.5%-20%) ASCVD Risk Score: 16.8% Assess this condition today?: Yes LDL Goal: <100 Has patient  tried and failed any HLD Medications?: No Check present secondary causes (below) that can lead to increased cholesterol levels (multi-choice optional): Steroids We discussed: How to reduce cholesterol through diet/weight management and physical activity., How a diet high in plant sterols (fruits/vegetables/nuts/whole grains/legumes) may reduce your cholesterol., Encouraged increasing fiber to a daily intake of 10-25g/day Assessment:: Uncontrolled Drug: None HC Follow up: Lipid Assessment in June - Record Pt updated lipid panel, record pt food diary, ask if pt made any changes to diet Pharmacist Follow up: Continue to check LDL, pt would benefit from statin  Pre-Diabetes Current A1C: 5.3 taken on: 11/19/2020 Previous A1C: 5.5 taken on: 10/30/2019 Type: Pre-Diabetes/Impaired Fasting Glucose Assess this condition today?: Yes Goal A1C: < 6.5 % Type: Pre-Diabetes/Impaired Fasting Glucose We discussed: How to recognize and treat signs of hypoglycemia., Low carbohydrate eating plan with an emphasis on whole grains, legumes, nuts, fruits, and vegetables and minimal refined and processed foods., Modifying lifestyle, including to participate in moderate physical activity (e.g., walking) at least 150 minutes per week. Assessment:: Controlled Drug: None HC Follow up: N/A Pharmacist Follow up: Continue to assess FBG and A1c  Osteopenia  Current T-score: R Femur Neck -1.3,  L Femur Neck - 1.6,  AP L1-L3 +0.8,  R total femur -0.4 taken on: 07/29/2020 Previous T-score: R Femur Neck -1.3,   L Femur Neck -1.2,  AP L1-L3 +0.8,  R total femur +0.1 taken on: 01/29/2017 Current Vitamin D 25-OH: 76 taken on: 11/19/2020 Previous Vitamin D 25-OH: 62 taken on: 07/09/2020 Assess this condition  today?: Yes Patient has: Osteopenia In the past 12 months, have you fallen?: No Is the home free of loose throw rugs in walkways, pet beds, electrical cords, etc.?: Yes Is there adequate lighting in your home to reduce the risk of falls?: Yes FRAX score - Hip (if not on treatment): 8.9% FRAX score - Major Fracture (if not on treatment): 1.1% Dietary calcium intake: Skim milk Risk Factors: PPI use, Estrogen deficiency, Family history We discussed: Weight bearing exercises (walking, light weights, resistance training), Dietary calcium intake, Fall prevention Assessment:: Controlled Drug: Vitamin D3 1000 IU QD Assessment: Appropriate, Effective, Safe, Accessible Drug: Vitamin D3 2000 IU QD Assessment: Appropriate, Effective, Safe, Accessible Drug: Os-Cal 500mg  QD Assessment: Appropriate, Effective, Safe, Accessible HC Follow up: N/A Pharmacist Follow up: Continue to monitor Vit D and Calcium levels. Assess fall risk. Recommend diagnosis code for Osteopenia on pt chart  Lumbar Back Pain Other Information: Pt states that she has bearable back pain, and currently she is not experiencing nerve pain. She stated that she is currently experiencing daytime grogginess with Gabapentin 100mg  QHS. She would like to discontinue medicine and is seeking guidance on how to discontinue Assessment:: Controlled Drug: Gabapentin 100mg  HS Assessment: Query need Plan to Start: Taper off of gabapentin by taking 100mg  capsule every other night for 1 week, then stop. If you notice any returning neuropathy, reinitiate 100mg  nightly HC Follow up: N/A Pharmacist Follow up: Continue to assess pain, assess if gabapentin taper was successful  Vitamin D deficiency  Pertinent Labs: Vit D 25OH (11/20/20): 76 Assessment:: Controlled Drug: Vitamin D3 1000 IU QD Assessment: Appropriate, Effective, Safe, Accessible Drug: Vitamin D3 2000 IU QD Assessment: Appropriate, Effective, Safe, Accessible Additional Info:  Congratulations on high vitamin D levels. Continue taking Vit D supplementation HC Follow up: N/A Pharmacist Follow up: Continue to monitor Vit D levels  Exercise, Diet and Non-Drug Coordination Needs Additional exercise counseling points. We discussed: targeting at least 150 minutes per  week of moderate-intensity aerobic exercise., incorporating flexibility, balance, and strength training exercises Additional diet counseling points. We discussed: key components of the DASH diet, limiting alcohol intake Discussed Non-Drug Care Coordination Needs: Yes Does Patient have Medication financial barriers?: No  Accountable Health Communities Health-Related Social Needs Screening Tool -  SDOH  ([email or web address]) What is your living situation today? (ref #1): I have a steady place to live Think about the place you live. Do you have problems with any of the following? (ref #2): None of the above Within the past 12 months, you worried that your food would run out before you got money to buy more (ref #3): Never true Within the past 12 months, the food you bought just didn't last and you didn't have money to get more (ref #4): Never true In the past 12 months, has lack of reliable transportation kept you from medical appointments, meetings, work or from getting things needed for daily living? (ref #5): No In the past 12 months, has the electric, gas, oil, or water company threatened to shut off services in your home? (ref #6): No How often does anyone, including family and friends, physically hurt you? (ref #7): Never (1) How often does anyone, including family and friends, insult or talk down to you? (ref #8): Never (1) How often does anyone, including friends and family, threaten you with harm? (ref #9): Never (1) How often does anyone, including family and friends, scream or curse at you? (ref #10): Never (1)  Engagement Notes Newton Pigg on  02/25/2021 10:37 AM HC Chart/ CP prep: 56 min CPP Chart Review: 29 min CPP Office Visit: 40 min CPP Office Visit Documentation: 51 min CPP Coordination of Care: 5 min [facility] Care Plan Completion: 30 min CPP Care Plan Review:19 min  Engagement Notes Newton Pigg on 02/25/2021 10:17 AM HC F/u: Lipid Assessment in June - Record Pt updated lipid panel, record pt food diary, ask if pt made any changes to diet. please also ask about alcohol consumption, if she was able to decrease intake.  CPP F/u: 07/09/21: Ok [place] w/ Hinton Dyer 11/21/21: Ok [place] w/ Caryl Pina 12/24/21: CCM phone follow up (Assess LDL, was pt able to d/c gabapentin, GERD, A1c)   Care Gaps: Tdap Due: Educated patient COVID booster: Educated patient   Patient Name: Helen Glover, Helen Glover  DOB:  28-Jun-1950   Last Care Plan Update: 02/25/2021  COMPREHENSIVE CARE PLAN AND GOALS   HYPERTENSION  MOST RECENT BLOOD PRESSURE:     138/70 MY GOAL BLOOD PRESSURE:  <130/90 CURRENT MEDICATION AND DOSING:  None THE GOALS WE HAVE CHOSEN ARE:    -Maintain an at goal blood pressure  BARRIERS TO ACHIEVING GOALS:  -At goal PLAN TO WORK ON THESE GOALS:  -Check BP at home  -Limit Alcohol 1-2 drinks per day  -Reduce salt intake (< 1500mg / day)  -Diet: DASH diet (Choose fruits, vegetables, and low-fat dairy products. Increase whole grains, fish, poultry, nuts. Reduce red meats and sugars)  -Weight: 1 kg = ~1 mmHg reduction  -Exercise targeting 150 minutes of aerobic activity per week  CHOLESTEROL  MOST RECENT LABS:    11/19/2020 -TOTAL CHOLESTEROL: 233 -TRIGLYCERIDES: 63 -HDL: 97 -LDL: 121 CURRENT MEDICATION AND DOSING:  None THE GOALS WE HAVE CHOSEN ARE:    -Total Cholesterol goal under 200, Triglycerides goal under 150, HDL goal above 40, LDL goal under 100  BARRIERS TO ACHIEVING GOALS:  -Not at goal PLAN TO WORK ON THESE GOALS:  -Take medication regularly  -Diet: high in plant  sterols (fruits/ vegetables/ nuts/ whole grains/ legumes). Increase  fiber intake (10-25g/day). Avoid foods high in cholesterol (red meat, egg yolks, dairy, oils/ butter). Choose low-fat options.  -Exercise  -Weight Management  PRE-DIABETES  MOST RECENT A1C:  5.3% MY GOAL A1C:  <6.5% CURRENT MEDICATION AND DOSING: none THE GOALS WE HAVE CHOSEN ARE:   -Maintain an at goal A1C level  BARRIERS TO ACHIEVING GOALS:  -At goal PLAN TO WORK ON THESE GOALS:  - How to recognize and treat signs of hypoglycemia -Low carbohydrate eating plan with an emphasis on whole grains, legumes, nuts, fruits, and vegetables and minimal refined and processed foods -Modifying lifestyle, including to participate in moderate physical activity (e.g., walking) at least 150 minutes per week -Recognize sign and symptoms of hypoglycemia and understand treatment as outlined below  -Hypoglycemia sign and symptoms: dizziness, anxiety/ irritability, shakiness, headache, diaphoresis, hunger, confusion, nausea, ataxia, tremors, palpitations/ tachycardia, blurred vision  -Hypoglycemia Treatment: Rule of 15:   (1) 15-20 grams of glucose/ simple carb (4 oz juice, 8oz milk, 4oz regular soda, 1 tablespoon sugar/ honey/ corn syrup, 3-4 glucose tabs/ 1 serving glucose gel)  (2) Recheck BG after 15 mins. If hypoglycemia continues  (3) Repeat steps 1&2, when BG normalizes, eat small meal or snack     Osteopenia/ Osteoporosis  CURRENT REGIMEN AND DOSING: Vitamin D3 1000 IU once daily, Vitamin D3 2000 IU once daily, OS-Cal 500mg  once daily, Gabapentin 100mg  at bedtime THE GOALS WE HAVE CHOSEN ARE:  -Reduce fall risk  -Strengthen muscles and bones  PLAN TO WORK ON THESE GOALS:    -Assess home for fall risks, use handrails on stairs and safety bars in bathroom, non-skid mats for the bathtub/ shower  -Exercise: regular weight bearing exercises (walking, jogging) and muscle strengthening (weight training, yoga)   Vitamin D deficiency  CURRENT REGIMEN AND DOSING:  Vitamin D3 1000 IU once daily,  Vitamin D3 2000 IU once daily THE GOALS WE HAVE CHOSEN ARE:  -Lower and manage symptoms of anxiety  PLAN TO WORK ON THESE GOALS:    -Continue taking Vitamin D supplementation   Lumbar Back Pain CURRENT REGIMEN AND DOSING:  Gabapentin 100mg  once daily at bedtime THE GOALS WE HAVE CHOSEN ARE:  - Taper off of gabapentin by taking 100mg  capsule every other night for 1 week, then stop. If you notice any returning neuropathy, reinitiate 100mg  nightly PLAN TO WORK ON THESE GOALS:    -Taper off gabapentin -Inform practitioner of any signs and symptoms of worsening pain    ACTIVE MEDICATION LIST  Your current medication list has been updated. To view, log in to your patient portal.  Call if any changes need to be made.   MEDICATION REVIEW  MEDICATION REVIEW CONDUCTED:   Yes   DATE:    02/25/2021 BEST POSSIBLE MEDICATION HISTORY  SOURCE:   Medical Records     HOW DO I? - WHEN DO I?   GET AHOLD OF MY DOCTOR?   DURING BUSINESS HOURS WHEN THE OFFICE IS OPEN    PHONE: [phone] AFTER HOURS [facility] NURSE WHEN THE OFFICE IS CLOSED  PHONE: [phone]  TALK TO [place] CARE COORDINATOR NAME: Newton Pigg  PHONE: [phone] EMAIL:  [email or web address] CARE COORDINATOR STAFF   NAME: Vanetta Shawl, Elrama Newton Pigg ,CPP PHONE: [phone]  NOTE SECTION  Thank you for participating in the Chronic Care Management (CCM) program with Dr. Newton Pigg   This program takes a proactive approach to your health and my team will serve as  a resource for you throughout the year. Please follow up at [phone] if you have any questions or experience changes to your overall health. Your next CCM appointment will be conducted with Newton Pigg, PharmD as follows:    Date:  12/24/2021  Time: 9:00AM   Over the Phone      Rachelle Hora. Jeannett Senior, PharmD  Clinical Pharmacist  [email or web address]  [phone]

## 2021-02-26 ENCOUNTER — Other Ambulatory Visit: Payer: Self-pay | Admitting: Adult Health

## 2021-02-26 ENCOUNTER — Encounter: Payer: Self-pay | Admitting: Adult Health

## 2021-02-26 ENCOUNTER — Ambulatory Visit
Admission: RE | Admit: 2021-02-26 | Discharge: 2021-02-26 | Disposition: A | Discharge status: Home / Self Care | Payer: PPO | Source: Ambulatory Visit | Attending: Adult Health | Admitting: Adult Health

## 2021-02-26 DIAGNOSIS — R053 Chronic cough: Secondary | ICD-10-CM

## 2021-02-26 DIAGNOSIS — R059 Cough, unspecified: Secondary | ICD-10-CM | POA: Diagnosis not present

## 2021-03-18 DIAGNOSIS — I10 Essential (primary) hypertension: Secondary | ICD-10-CM | POA: Diagnosis not present

## 2021-03-18 DIAGNOSIS — E785 Hyperlipidemia, unspecified: Secondary | ICD-10-CM | POA: Diagnosis not present

## 2021-03-18 DIAGNOSIS — M5416 Radiculopathy, lumbar region: Secondary | ICD-10-CM | POA: Diagnosis not present

## 2021-03-18 DIAGNOSIS — E559 Vitamin D deficiency, unspecified: Secondary | ICD-10-CM | POA: Diagnosis not present

## 2021-03-26 ENCOUNTER — Encounter: Payer: Self-pay | Admitting: Adult Health

## 2021-04-04 DIAGNOSIS — M5416 Radiculopathy, lumbar region: Secondary | ICD-10-CM | POA: Diagnosis not present

## 2021-04-09 DIAGNOSIS — M5416 Radiculopathy, lumbar region: Secondary | ICD-10-CM | POA: Diagnosis not present

## 2021-04-09 DIAGNOSIS — M4316 Spondylolisthesis, lumbar region: Secondary | ICD-10-CM | POA: Diagnosis not present

## 2021-04-09 NOTE — Progress Notes (Signed)
Assessment and Plan:  Helen Glover was seen today for hemorrhoids.  Diagnoses and all orders for this visit:  Hemorrhoids, unspecified hemorrhoid type Continue to maintain soft stools, avoid excess wiping, suggest sitz baths, bidet attachment, dab to dry at the end rather than vigorous wiping. Hydrocortisone PRN for 1-2 weeks. Contact office if atypical bleeding. IF persistent recurrent flares discussed possible procedural intervention by GI.  -     hydrocortisone (ANUSOL-HC) 2.5 % rectal cream; Place 1 application rectally 2 (two) times daily.  Further disposition pending results of labs. Discussed med's effects and SE's.   Over 15 minutes of exam, counseling, chart review, and critical decision making was performed.   Future Appointments  Date Time Provider [place]  04/10/2021  9:30 AM Liane Comber, NP [facility] None  07/11/2021 10:00 AM Magda Bernheim, NP [facility] None  10/07/2021 10:15 AM Megan Salon, MD [facility]  11/21/2021  9:00 AM Magda Bernheim, NP [facility] None  12/24/2021  9:00 AM Newton Pigg, RPH [facility] None    ------------------------------------------------------------------------------------------------------------------   HPI [name] 10/18/2001  71 y.o.female presents for evaluation of hemorrhoids.   She reports long history of hemorrhoids since childbirth, having more flares since change in bowel pattern following cervical surgery in 2020 (initially with diarrhea 6+ episodes, had GI workup that was benign, now 3 very soft stools). She reports has to wipe more than previous with soft stools to clean and intermittently has bright red bleeding/streaks and tenderness. She has been trying pre H but current flare persistent for several weeks.   Had benign colonoscopy Dr. Paulita Fujita 07/14/2018  Past Medical History:  Diagnosis Date   Anxiety    Arthritis    in neck   Bursitis of right hip    Carpal tunnel syndrome, bilateral    rt wrist   Complete rotator  cuff tear of left shoulder    Eagle's syndrome    GERD (gastroesophageal reflux disease)    Hearing loss of both ears 12/25/2014   Has hearing aids/tubes    Heart murmur    as child   HNP (herniated nucleus pulposus)    Interstitial cystitis    Kidney cysts    Osteopenia    Plantar fasciitis    Spinal stenosis    Tingling    rt foot   Wears hearing aid      Allergies  Allergen Reactions   Other     Band-aid   Codeine Hives   Sulfa Antibiotics Nausea And Vomiting    Current Outpatient Medications on File Prior to Visit  Medication Sig   Ascorbic Acid (VITAMIN C) 100 MG tablet Take 1,000 mg by mouth daily.   Biotin 1 MG CAPS Take 1 capsule by mouth daily.    calcium carbonate (OS-CAL - DOSED IN MG OF ELEMENTAL CALCIUM) 1250 (500 Ca) MG tablet Take 1 tablet by mouth daily with breakfast.   cholecalciferol (VITAMIN D) 1000 UNITS tablet Take 3,000 Units by mouth daily.    diltiazem 2 % GEL Apply 1 application topically 3 (three) times daily. (Patient not taking: Reported on 02/25/2021)   estradiol (VIVELLE-DOT) 0.025 MG/24HR Place 1 patch onto the skin 2 (two) times a week.   GABAPENTIN PO Take 100 mg by mouth at bedtime.   L-Arginine 1000 MG TABS Take 1 tablet by mouth daily.   omeprazole (PRILOSEC) 20 MG capsule TAKE 1 CAPSULE DAILY TO PREVENT HEARTBURN & INDIGESTION   phenylephrine (SUDAFED PE) 10 MG TABS tablet Take 10 mg by mouth  every 4 (four) hours as needed.   pramoxine-hydrocortisone (PROCTOCREAM-HC) 1-1 % rectal cream Place 1 application rectally 2 (two) times daily. (Patient not taking: Reported on 02/25/2021)   Probiotic Product (PROBIOTIC PO) Take 1 tablet by mouth daily.   promethazine-dextromethorphan (PROMETHAZINE-DM) 6.25-15 MG/5ML syrup Take 5 mLs by mouth 4 (four) times daily as needed for cough. (Patient not taking: Reported on 02/25/2021)   pyridOXINE (VITAMIN B-6) 100 MG tablet Take 100 mg by mouth daily.   vitamin B-12 (CYANOCOBALAMIN) 100 MCG tablet Take 1,000  mcg by mouth daily.   No current facility-administered medications on file prior to visit.    ROS: all negative except above.   Physical Exam:  [name] 10/18/2001   General Appearance: Well nourished, in no apparent distress. Eyes: PERRLA, conjunctiva no swelling or erythema ENT/Mouth: mask in place; Hearing normal.  Neck: Supple, thyroid normal.  Respiratory: Respiratory effort normal, BS equal bilaterally without rales, rhonchi, wheezing or stridor.  Cardio: RRR with no MRGs. Brisk peripheral pulses without edema.  Abdomen: Soft, + BS.  Non tender, no guarding, rebound, hernias, masses. Lymphatics: Non tender without lymphadenopathy.  Musculoskeletal: no obvious deformity; normal gait.  Skin: Warm, dry without rashes, lesions, ecchymosis.  Neuro: Normal muscle tone Psych: Awake and oriented X 3, normal affect, Insight and Judgment appropriate.  Rectal exam: tenderness noted around 2 o clock with internal hemorrhoid, no active bleeding, external tags, sphincter tone normal, stool guaiac negative.    [name], NP 6:02 PM [facility]

## 2021-04-10 ENCOUNTER — Other Ambulatory Visit: Payer: Self-pay

## 2021-04-10 ENCOUNTER — Ambulatory Visit (INDEPENDENT_AMBULATORY_CARE_PROVIDER_SITE_OTHER): Payer: PPO | Admitting: Adult Health

## 2021-04-10 ENCOUNTER — Encounter: Payer: Self-pay | Admitting: Adult Health

## 2021-04-10 VITALS — BP 136/78 | HR 68 | Temp 97.3°F | Wt 118.0 lb

## 2021-04-10 DIAGNOSIS — K649 Unspecified hemorrhoids: Secondary | ICD-10-CM | POA: Diagnosis not present

## 2021-04-10 MED ORDER — HYDROCORTISONE (PERIANAL) 2.5 % EX CREA: 1.0000 "application " | TOPICAL_CREAM | Freq: Two times a day (BID) | CUTANEOUS | 0 refills | Status: DC

## 2021-04-10 NOTE — Patient Instructions (Signed)
Hemorrhoids °Hemorrhoids are swollen veins in and around the rectum or anus. There are two types of hemorrhoids: °Internal hemorrhoids. These occur in the veins that are just inside the rectum. They may poke through to the outside and become irritated and painful. °External hemorrhoids. These occur in the veins that are outside the anus and can be felt as a painful swelling or hard lump near the anus. °Most hemorrhoids do not cause serious problems, and they can be managed with home treatments such as diet and lifestyle changes. If home treatments do not help the symptoms, procedures can be done to shrink or remove the hemorrhoids. °What are the causes? °This condition is caused by increased pressure in the anal area. This pressure may result from various things, including: °Constipation. °Straining to have a bowel movement. °Diarrhea. °Pregnancy. °Obesity. °Sitting for long periods of time. °Heavy lifting or other activity that causes you to strain. °Anal sex. °Riding a bike for a long period of time. °What are the signs or symptoms? °Symptoms of this condition include: °Pain. °Anal itching or irritation. °Rectal bleeding. °Leakage of stool (feces). °Anal swelling. °One or more lumps around the anus. °How is this diagnosed? °This condition can often be diagnosed through a visual exam. Other exams or tests may also be done, such as: °An exam that involves feeling the rectal area with a gloved hand (digital rectal exam). °An exam of the anal canal that is done using a small tube (anoscope). °A blood test, if you have lost a significant amount of blood. °A test to look inside the colon using a flexible tube with a camera on the end (sigmoidoscopy or colonoscopy). °How is this treated? °This condition can usually be treated at home. However, various procedures may be done if dietary changes, lifestyle changes, and other home treatments do not help your symptoms. These procedures can help make the hemorrhoids smaller or  remove them completely. Some of these procedures involve surgery, and others do not. Common procedures include: °Rubber band ligation. Rubber bands are placed at the base of the hemorrhoids to cut off their blood supply. °Sclerotherapy. Medicine is injected into the hemorrhoids to shrink them. °Infrared coagulation. A type of light energy is used to get rid of the hemorrhoids. °Hemorrhoidectomy surgery. The hemorrhoids are surgically removed, and the veins that supply them are tied off. °Stapled hemorrhoidopexy surgery. The surgeon staples the base of the hemorrhoid to the rectal wall. °Follow these instructions at home: °Eating and drinking ° °Eat foods that have a lot of fiber in them, such as whole grains, beans, nuts, fruits, and vegetables. °Ask your health care provider about taking products that have added fiber (fiber supplements). °Reduce the amount of fat in your diet. You can do this by eating low-fat dairy products, eating less red meat, and avoiding processed foods. °Drink enough fluid to keep your urine pale yellow. °Managing pain and swelling ° °Take warm sitz baths for 20 minutes, 3-4 times a day to ease pain and discomfort. You may do this in a bathtub or using a portable sitz bath that fits over the toilet. °If directed, apply ice to the affected area. Using ice packs between sitz baths may be helpful. °Put ice in a plastic bag. °Place a towel between your skin and the bag. °Leave the ice on for 20 minutes, 2-3 times a day. °General instructions °Take over-the-counter and prescription medicines only as told by your health care provider. °Use medicated creams or suppositories as told. °Get regular exercise.   Ask your health care provider how much and what kind of exercise is best for you. In general, you should do moderate exercise for at least 30 minutes on most days of the week (150 minutes each week). This can include activities such as walking, biking, or yoga. °Go to the bathroom when you have  the urge to have a bowel movement. Do not wait. °Avoid straining to have bowel movements. °Keep the anal area dry and clean. Use wet toilet paper or moist towelettes after a bowel movement. °Do not sit on the toilet for long periods of time. This increases blood pooling and pain. °Keep all follow-up visits as told by your health care provider. This is important. °Contact a health care provider if you have: °Increasing pain and swelling that are not controlled by treatment or medicine. °Difficulty having a bowel movement, or you are unable to have a bowel movement. °Pain or inflammation outside the area of the hemorrhoids. °Get help right away if you have: °Uncontrolled bleeding from your rectum. °Summary °Hemorrhoids are swollen veins in and around the rectum or anus. °Most hemorrhoids can be managed with home treatments such as diet and lifestyle changes. °Taking warm sitz baths can help ease pain and discomfort. °In severe cases, procedures or surgery can be done to shrink or remove the hemorrhoids. °This information is not intended to replace advice given to you by your health care provider. Make sure you discuss any questions you have with your health care provider. °Document Revised: 08/15/2020 Document Reviewed: 08/15/2020 °Elsevier Patient Education © 2022 [employer] ° °

## 2021-04-23 DIAGNOSIS — M5416 Radiculopathy, lumbar region: Secondary | ICD-10-CM | POA: Diagnosis not present

## 2021-05-15 DIAGNOSIS — M545 Low back pain, unspecified: Secondary | ICD-10-CM | POA: Diagnosis not present

## 2021-05-15 DIAGNOSIS — M65342 Trigger finger, left ring finger: Secondary | ICD-10-CM | POA: Diagnosis not present

## 2021-05-27 ENCOUNTER — Encounter: Payer: Self-pay | Admitting: Internal Medicine

## 2021-06-10 DIAGNOSIS — H903 Sensorineural hearing loss, bilateral: Secondary | ICD-10-CM | POA: Diagnosis not present

## 2021-06-10 DIAGNOSIS — H6982 Other specified disorders of Eustachian tube, left ear: Secondary | ICD-10-CM | POA: Diagnosis not present

## 2021-06-10 DIAGNOSIS — H7202 Central perforation of tympanic membrane, left ear: Secondary | ICD-10-CM | POA: Diagnosis not present

## 2021-06-25 DIAGNOSIS — M1612 Unilateral primary osteoarthritis, left hip: Secondary | ICD-10-CM | POA: Diagnosis not present

## 2021-06-27 ENCOUNTER — Telehealth: Payer: Self-pay

## 2021-06-27 NOTE — Telephone Encounter (Signed)
LM-06/27/21-Calling pt. To complete [facility] to [facility] program transfer. Spoke w/ pt. And transfer completed. Informed pt. To continue following up w/ Dr. Melford Aase as scheduled and to call us if any new health concerns arise. Pt. Verbalized understanding and agreed. ? ?Total time spent: 5 min. ?

## 2021-07-09 DIAGNOSIS — M1612 Unilateral primary osteoarthritis, left hip: Secondary | ICD-10-CM | POA: Diagnosis not present

## 2021-07-09 NOTE — Progress Notes (Unsigned)
MEDICARE ANNUAL WELLNESS VISIT AND FOLLOW UP Assessment:   Annual Medicare Wellness Visit Due annually   Aortic atherosclerosis ([place]) Control blood pressure, cholesterol, glucose, increase exercise.   Essential hypertension Recently controlled off of medications Monitor blood pressure  Continue DASH diet.   Reminder to go to the ER if any CP, SOB, nausea, dizziness, severe HA, changes vision/speech, left arm numbness and tingling and jaw pain. - CBC, CMP  Gastroesophageal reflux disease, esophagitis presence not specified Taking omeprazole in AM and reports well controlled  Discussed diet, avoiding triggers and other lifestyle changes  Abnormal Glucose Continue diet and exercise - A1c  Hyperlipidemia, mixed Mild elevations trying lifestyle changes Discussed dietary and exercise modifications Check lipids, plan to initiate med if LDL remains significantly elevated near 130 - TSH  Medication management CMP   Interstitial cystitis  managing with lifestyle, follows urology  Alcohol use  Has reduced intake, avoiding daily intake  Working with therapist No history of DT's/withdrawals  Medication management Continued  Vitamin D deficiency Last VIT at goal Please take your thyroid medication greater than 30 min before breakfast, separated by at least 4 hours  from antacids, calcium, iron, and multivitamins.   Lumbar back pain with R radicular sx S/p laminectomy, neurosurgery following, planning NCV Symptoms are worsening and is going to schedule a follow up with Dr. Ronnald Ramp     I provided 40 minutes of non-face-to-face time during this encounter including counseling, chart review, and critical decision making was preformed.   Future Appointments  Date Time Provider [place]  10/07/2021 10:15 AM Megan Salon, MD [facility]  11/21/2021  9:00 AM Magda Bernheim, NP [facility] None      Plan:   During the course of the visit the patient was educated  and counseled about appropriate screening and preventive services including:   Pneumococcal vaccine  Influenza vaccine Prevnar 13 Td vaccine Screening electrocardiogram, deferred, telephone visit [place]. Colorectal cancer screening Diabetes screening Glaucoma screening Nutrition counseling    Subjective:  Helen Glover is a 71 y.o. female who presents for Medicare Annual Wellness Visit and follow up for HTN, hyperlipidemia, prediabetes, GERD and vitamin D Def.   She has been down with the pandemic, mild sx, does have a therapist that she is following with with benefit. She was drinking a lot, variable, husband also drinks a lot. She has reduced, days the she doesn't drink at all and sometimes will drink 3 glasses a day.  02/25/18 patient had C1-C2 posterior laminectomy and fusion by Dr Matthew Saras, doing well since. She has spinal stenosis with radiculopathy followed by Dr. Brien Few, had surgery by Dr. Ronnald Ramp 06/18/2020, also has been getting injections with immense benefit, doing gabapentin 100 mg at night. Has residual RLE numbness/tingling, has NCV planned. She is having issues with sciatica down both legs but worse on the right. Continues to follow with Dr. Ronnald Ramp.   She has appointment with Dr. Delfino Lovett to evaluate her hip. She is having mild left hip pain but back pain is much worse.   Follows with Dr. Veverly Fells for knee, recently had R knee arthroscopy 06/22/2019, doing well. R shoulder had ? Frozen shoulder, but improved after neck surgery.   She is taking omeprazole '20mg'$  in the morning.  She is also taking a probiotic tablet daily.  She had hysterectomy and continues on low dose estradiol patch, follows with GYN.   Interstitial cystitis - patient states mild now that lower stress since retired, managing with lifestyle only.  BMI is Body mass index is 24.87 kg/m., she has been working on diet, exercises limited by ortho conditions, does upper body weights and walks as able.  Wt Readings  from Last 3 Encounters:  07/11/21 119 lb (54 kg)  04/10/21 118 lb (53.5 kg)  02/13/21 118 lb (53.5 kg)   She does not check BP at home, not on any meds, today their BP is BP: 133/80  BP Readings from Last 3 Encounters:  07/11/21 133/80  04/10/21 136/78  02/13/21 138/70  She does workout. She denies chest pain, shortness of breath, dizziness.   She has aortic atherosclerosis per CT 07/2018.   She is not on cholesterol medication and denies myalgias. Her cholesterol is not at goal. No family hx of MI/CVA. The cholesterol last visit was:   Lab Results  Component Value Date   CHOL 233 (H) 11/20/2020   HDL 97 11/20/2020   LDLCALC 121 (H) 11/20/2020   TRIG 63 11/20/2020   CHOLHDL 2.4 11/20/2020   She has been working on diet and exercise for hx of prediabetes (A1C 5.7% in 2014), and denies hyperglycemia, hypoglycemia , increased appetite, nausea, paresthesia of the feet, polydipsia, polyuria, visual disturbances, vomiting and weight loss. Last A1C in the office was:  Lab Results  Component Value Date   HGBA1C 5.3 11/20/2020   Last GFR Lab Results  Component Value Date   GFRNONAA 95 07/10/2020   Patient is on Vitamin D supplement, taking ? 3000 IU daily.   Lab Results  Component Value Date   VD25OH 76 11/20/2020      Medication Review:  Current Outpatient Medications (Endocrine & Metabolic):    estradiol (VIVELLE-DOT) 0.025 MG/24HR, Place 1 patch onto the skin 2 (two) times a week.     Current Outpatient Medications (Hematological):    vitamin B-12 (CYANOCOBALAMIN) 100 MCG tablet, Take 1,000 mcg by mouth daily.  Current Outpatient Medications (Other):    Ascorbic Acid (VITAMIN C) 100 MG tablet, Take 1,000 mg by mouth daily.   Biotin 1 MG CAPS, Take 1 capsule by mouth daily.    calcium carbonate (OS-CAL - DOSED IN MG OF ELEMENTAL CALCIUM) 1250 (500 Ca) MG tablet, Take 1 tablet by mouth daily with breakfast.   cholecalciferol (VITAMIN D) 1000 UNITS tablet, Take 3,000  Units by mouth daily.    GABAPENTIN PO, Take 100 mg by mouth at bedtime.   hydrocortisone (ANUSOL-HC) 2.5 % rectal cream, Place 1 application rectally 2 (two) times daily.   L-Arginine 1000 MG TABS, Take 1 tablet by mouth daily.   omeprazole (PRILOSEC) 20 MG capsule, TAKE 1 CAPSULE DAILY TO PREVENT HEARTBURN & INDIGESTION   Probiotic Product (PROBIOTIC PO), Take 1 tablet by mouth daily.   pyridOXINE (VITAMIN B-6) 100 MG tablet, Take 100 mg by mouth daily.  Allergies: Allergies  Allergen Reactions   Other     Band-aid   Codeine Hives   Sulfa Antibiotics Nausea And Vomiting    Current Problems (verified) has Interstitial cystitis; GERD (gastroesophageal reflux disease); Hyperlipidemia; Hypertension; History of prediabetes; Vitamin D deficiency; Aortic atherosclerosis ([place]); Lumbar back pain with radiculopathy affecting right lower extremity; Osteopenia; and Hemorrhoids on their problem list.  Screening Tests Health Maintenance  Topic Date Due   TETANUS/TDAP  07/12/2022 (Originally 09/23/2020)   MAMMOGRAM  07/30/2021   INFLUENZA VACCINE  09/17/2021   COLONOSCOPY (Pts 45-66yr Insurance coverage will need to be confirmed)  07/14/2023   Pneumonia Vaccine 70 Years old  Completed   DEXA SCAN  Completed  COVID-19 Vaccine  Completed   Hepatitis C Screening  Completed   Zoster Vaccines- Shingrix  Completed   HPV VACCINES  Aged Out   Immunization History  Administered Date(s) Administered   DTaP 10/04/2010   Fluad Quad(high Dose 65+) 10/26/2018, 12/04/2019   Influenza, High Dose Seasonal PF 11/24/2016, 11/11/2020   Influenza,inj,quad, With Preservative 11/17/2016, 10/19/2018   Influenza-Unspecified 11/29/2012, 11/29/2013, 11/17/2014, 11/27/2015, 11/24/2016, 10/04/2017   Moderna Covid-19 Vaccine Bivalent Booster 66yr & up 06/08/2021   Moderna Sars-Covid-2 Vaccination 10/30/2020   PFIZER(Purple Top)SARS-COV-2 Vaccination 03/08/2019, 03/26/2019, 11/14/2019, 05/17/2020   Pneumococcal  Conjugate-13 12/21/2013   Pneumococcal Polysaccharide-23 12/25/2016   Pneumococcal-Unspecified 12/04/2002   Zoster Recombinat (Shingrix) 05/22/2016, 09/17/2016   Zoster, Live 12/03/2005    Last colonoscopy: 06/2018 Dr. OPaulita Fujitadue 5 years Last mammogram: 07/2020 scheduled for 08/06/21 Last pap smear/pelvic exam: 06/27/2013 Dr. MSabra Heck DONE paps, TAH, pelvic only by GYN DEXA: 2018 Dr. MSabra Heckwill get with next MGM -1.3 - has scheduled  AB [place]2018 CT AB 07/2018 - aortic atherosclerosis    Prior vaccinations: TD or Tdap: 2012 will boost PRN Influenza: 2021  Pneumococcal: 12/25/2016 Prevnar13: 2015 Shingles/Zostavax: 2007 Shingrix 2018, reports [facility] Covid 19: 2/2, 2021, pfizer + 2 boosters  Names of Other Physician/Practitioners you currently use: 1. [facility] here for primary [facility] 2. DDonato Heinz eye doctor, last visit 07/2020, wears glasses, has scheduled 3. Dr. FToy Cookey dentist, last visit 02/2021, goes q661mPatient Care Team: McUnk PintoMD as PCP - General (Internal Medicine) MiBarbaraann CaoOD as Referring Physician (Optometry) DeMarlou SaGrTonna CornerMD as Consulting Physician (Orthopedic Surgery) GrBerenice PrimasMD as Referring Physician (Orthopedic Surgery) MiMegan SalonMD as Consulting Physician (Gynecology) HeArdis HughsMD as Attending Physician (Urology) TeLeta BaptistMD as Consulting Physician (Otolaryngology) OuArta SilenceMD as Consulting Physician (Gastroenterology) AdNewton [facility]RP[facility] Pharmacist (Pharmacist)  Surgical: She  has a past surgical history that includes Cesarean section; Myringotomy; Lumbar laminectomy; Carpal tunnel release; Trigger finger release; Anal sphincterotomy; Nasal sinus surgery; Abdominal hysterectomy (2003); Hemorrhoid surgery (N/A, 1998); Cystoscopy; Tympanostomy tube placement; Tonsillectomy; Tonsillectomy (10/14); Lumbar laminectomy/decompression microdiscectomy  (N/A, 08/09/2014); Cervical fusion (02/25/2018); and Knee arthroscopy (Right, 06/22/2019). Family Her family history includes Diabetes in her mother; Hyperlipidemia in her brother and mother; Hypertension in her brother and mother; Osteoporosis in her mother; Prostate cancer in her father. Social history  She reports that she quit smoking about 34 years ago. Her smoking use included cigarettes. She has a 7.50 pack-year smoking history. She has never used smokeless tobacco. She reports current alcohol use of about 6.0 - 7.0 standard drinks per week. She reports that she does not use drugs.   MEDICARE WELLNESS OBJECTIVES: Physical activity: Current Exercise Habits: Home exercise routine, Type of exercise: walking, Time (Minutes): 30, Frequency (Times/Week): 4, Weekly Exercise (Minutes/Week): 120, Intensity: Mild, Exercise limited by: orthopedic condition(s) Cardiac risk factors: Cardiac Risk Factors include: advanced age (>5552m >65>38men);dyslipidemia;hypertension;smoking/ tobacco exposure Depression/mood screen:      07/11/2021   10:25 AM  Depression screen PHQ 2/9  Decreased Interest 1  Down, Depressed, Hopeless 1  PHQ - 2 Score 2  Altered sleeping 0  Tired, decreased energy 1  Change in appetite 0  Feeling bad or failure about yourself  1  Trouble concentrating 0  Moving slowly or fidgety/restless 0  Suicidal thoughts 0  PHQ-9 Score 4  Difficult doing work/chores Not difficult at all    ADLs:  07/11/2021   10:26 AM  In your present state of health, do you have any difficulty performing the following activities:  Hearing? 0  Vision? 0  Difficulty concentrating or making decisions? 0  Walking or climbing stairs? 0  Dressing or bathing? 0  Doing errands, shopping? 0     Cognitive Testing  Alert? Yes  Normal Appearance?Yes  Oriented to person? Yes  Place? Yes   Time? Yes  Recall of three objects?  Yes  Can perform simple calculations? Yes  Displays appropriate  judgment?Yes  Can read the correct time from a watch face?Yes  EOL planning: Does Patient Have a Medical Advance Directive?: Yes Type of Advance Directive: Healthcare Power of Attorney, Living will Does patient want to make changes to medical advance directive?: No - Patient declined Copy of [place] in Chart?: No - copy requested      Objective:   Today's Vitals   07/11/21 1005  BP: 133/80  Pulse: 62  Temp: (!) 96.8 F (36 C)  SpO2: 99%  Weight: 119 lb (54 kg)    Body mass index is 24.87 kg/m.  General appearance: alert, no distress, WD/WN, female HEENT: normocephalic, sclerae anicteric, right TM with tube, left TM with perforation, nares patent, no discharge or erythema, pharynx normal Oral cavity: MMM, no lesions Neck: supple, no lymphadenopathy, no thyromegaly, no masses Heart: RRR, normal S1, S2, no murmurs Lungs: CTA bilaterally, no wheezes, rhonchi, or rales Abdomen: +bs, soft, non tender, non distended, no masses, no hepatomegaly, no splenomegaly Musculoskeletal: nontender, no swelling, no obvious deformity.  Extremities: no edema, no cyanosis, no clubbing Pulses: 2+ symmetric, upper and lower extremities, normal cap refill Neurological: alert, oriented x 3, CN2-12 intact, strength normal upper extremities and lower extremities, sensation mildly diminished R foot dorsum, DTRs 2+ throughout, no cerebellar signs, gait normal Psychiatric: normal affect, behavior normal, pleasant    Medicare Attestation I have personally reviewed: The patient's medical and social history Their use of alcohol, tobacco or illicit drugs Their current medications and supplements The patient's functional ability including ADLs,fall risks, home safety risks, cognitive, and hearing and visual impairment Diet and physical activities Evidence for depression or mood disorders  The patient's weight, height, BMI, and visual acuity have been recorded in the chart.  I have  made referrals, counseling, and provided education to the patient based on review of the above and I have provided the patient with a written personalized care plan for preventive services.     Magda Bernheim, NP 10:27 AM [facility]

## 2021-07-10 ENCOUNTER — Ambulatory Visit: Payer: PPO | Admitting: Adult Health

## 2021-07-11 ENCOUNTER — Ambulatory Visit (INDEPENDENT_AMBULATORY_CARE_PROVIDER_SITE_OTHER): Payer: PPO | Admitting: Nurse Practitioner

## 2021-07-11 ENCOUNTER — Encounter: Payer: Self-pay | Admitting: Nurse Practitioner

## 2021-07-11 VITALS — BP 133/80 | HR 62 | Temp 96.8°F | Wt 119.0 lb

## 2021-07-11 DIAGNOSIS — I10 Essential (primary) hypertension: Secondary | ICD-10-CM

## 2021-07-11 DIAGNOSIS — K219 Gastro-esophageal reflux disease without esophagitis: Secondary | ICD-10-CM

## 2021-07-11 DIAGNOSIS — I700 Atherosclerosis of aorta: Secondary | ICD-10-CM

## 2021-07-11 DIAGNOSIS — E559 Vitamin D deficiency, unspecified: Secondary | ICD-10-CM

## 2021-07-11 DIAGNOSIS — Z79899 Other long term (current) drug therapy: Secondary | ICD-10-CM | POA: Diagnosis not present

## 2021-07-11 DIAGNOSIS — R7309 Other abnormal glucose: Secondary | ICD-10-CM | POA: Diagnosis not present

## 2021-07-11 DIAGNOSIS — E782 Mixed hyperlipidemia: Secondary | ICD-10-CM

## 2021-07-11 DIAGNOSIS — Z789 Other specified health status: Secondary | ICD-10-CM

## 2021-07-11 DIAGNOSIS — N3010 Interstitial cystitis (chronic) without hematuria: Secondary | ICD-10-CM

## 2021-07-11 DIAGNOSIS — R6889 Other general symptoms and signs: Secondary | ICD-10-CM | POA: Diagnosis not present

## 2021-07-11 DIAGNOSIS — Z0001 Encounter for general adult medical examination with abnormal findings: Secondary | ICD-10-CM

## 2021-07-11 DIAGNOSIS — M5416 Radiculopathy, lumbar region: Secondary | ICD-10-CM | POA: Diagnosis not present

## 2021-07-11 DIAGNOSIS — Z0000 Encounter for general adult medical examination without abnormal findings: Secondary | ICD-10-CM

## 2021-07-12 LAB — COMPLETE METABOLIC PANEL WITH GFR
AG Ratio: 2 (calc) (ref 1.0–2.5)
ALT: 16 U/L (ref 6–29)
AST: 19 U/L (ref 10–35)
Albumin: 4.5 g/dL (ref 3.6–5.1)
Alkaline phosphatase (APISO): 64 U/L (ref 37–153)
BUN: 15 mg/dL (ref 7–25)
CO2: 26 mmol/L (ref 20–32)
Calcium: 9.3 mg/dL (ref 8.6–10.4)
Chloride: 104 mmol/L (ref 98–110)
Creat: 0.68 mg/dL (ref 0.60–1.00)
Globulin: 2.3 g/dL (calc) (ref 1.9–3.7)
Glucose, Bld: 90 mg/dL (ref 65–99)
Potassium: 4.4 mmol/L (ref 3.5–5.3)
Sodium: 139 mmol/L (ref 135–146)
Total Bilirubin: 0.5 mg/dL (ref 0.2–1.2)
Total Protein: 6.8 g/dL (ref 6.1–8.1)
eGFR: 94 mL/min/{1.73_m2} (ref >=60)

## 2021-07-12 LAB — LIPID PANEL
Cholesterol: 231 mg/dL — ABNORMAL HIGH (ref <200)
HDL: 104 mg/dL (ref >=50)
LDL Cholesterol (Calc): 110 mg/dL (calc) — ABNORMAL HIGH
Non-HDL Cholesterol (Calc): 127 mg/dL (calc) (ref <130)
Total CHOL/HDL Ratio: 2.2 (calc) (ref <5.0)
Triglycerides: 81 mg/dL (ref <150)

## 2021-07-12 LAB — CBC WITH DIFFERENTIAL/PLATELET
Absolute Monocytes: 549 cells/uL (ref 200–950)
Basophils Absolute: 39 cells/uL (ref 0–200)
Basophils Relative: 0.7 %
Eosinophils Absolute: 78 cells/uL (ref 15–500)
Eosinophils Relative: 1.4 %
HCT: 39.7 % (ref 35.0–45.0)
Hemoglobin: 13.4 g/dL (ref 11.7–15.5)
Lymphs Abs: 1831 cells/uL (ref 850–3900)
MCH: 32.4 pg (ref 27.0–33.0)
MCHC: 33.8 g/dL (ref 32.0–36.0)
MCV: 96.1 fL (ref 80.0–100.0)
MPV: 12.3 fL (ref 7.5–12.5)
Monocytes Relative: 9.8 %
Neutro Abs: 3102 cells/uL (ref 1500–7800)
Neutrophils Relative %: 55.4 %
Platelets: 239 10*3/uL (ref 140–400)
RBC: 4.13 10*6/uL (ref 3.80–5.10)
RDW: 12.6 % (ref 11.0–15.0)
Total Lymphocyte: 32.7 %
WBC: 5.6 10*3/uL (ref 3.8–10.8)

## 2021-07-12 LAB — HEMOGLOBIN A1C
Hgb A1c MFr Bld: 5.3 % of total Hgb (ref <5.7)
Mean Plasma Glucose: 105 mg/dL
eAG (mmol/L): 5.8 mmol/L

## 2021-07-12 LAB — TSH: TSH: 1.98 mIU/L (ref 0.40–4.50)

## 2021-07-15 ENCOUNTER — Encounter: Payer: Self-pay | Admitting: Nurse Practitioner

## 2021-07-16 DIAGNOSIS — M1612 Unilateral primary osteoarthritis, left hip: Secondary | ICD-10-CM | POA: Diagnosis not present

## 2021-07-20 DIAGNOSIS — M79644 Pain in right finger(s): Secondary | ICD-10-CM | POA: Diagnosis not present

## 2021-07-25 DIAGNOSIS — M79644 Pain in right finger(s): Secondary | ICD-10-CM | POA: Diagnosis not present

## 2021-07-25 DIAGNOSIS — M65839 Other synovitis and tenosynovitis, unspecified forearm: Secondary | ICD-10-CM | POA: Diagnosis not present

## 2021-07-25 DIAGNOSIS — M13849 Other specified arthritis, unspecified hand: Secondary | ICD-10-CM | POA: Diagnosis not present

## 2021-07-25 DIAGNOSIS — M779 Enthesopathy, unspecified: Secondary | ICD-10-CM | POA: Diagnosis not present

## 2021-07-25 DIAGNOSIS — M65342 Trigger finger, left ring finger: Secondary | ICD-10-CM | POA: Diagnosis not present

## 2021-08-05 ENCOUNTER — Other Ambulatory Visit (HOSPITAL_COMMUNITY)
Admission: RE | Admit: 2021-08-05 | Discharge: 2021-08-05 | Disposition: A | Discharge status: Home / Self Care | Payer: PPO | Source: Ambulatory Visit | Attending: Obstetrics & Gynecology | Admitting: Obstetrics & Gynecology

## 2021-08-05 ENCOUNTER — Encounter (HOSPITAL_BASED_OUTPATIENT_CLINIC_OR_DEPARTMENT_OTHER): Payer: Self-pay | Admitting: Obstetrics & Gynecology

## 2021-08-05 ENCOUNTER — Ambulatory Visit (HOSPITAL_BASED_OUTPATIENT_CLINIC_OR_DEPARTMENT_OTHER): Payer: PPO | Admitting: Obstetrics & Gynecology

## 2021-08-05 VITALS — BP 147/61 | HR 60 | Ht 58.5 in | Wt 118.6 lb

## 2021-08-05 DIAGNOSIS — N764 Abscess of vulva: Secondary | ICD-10-CM | POA: Diagnosis not present

## 2021-08-05 DIAGNOSIS — N898 Other specified noninflammatory disorders of vagina: Secondary | ICD-10-CM | POA: Insufficient documentation

## 2021-08-05 MED ORDER — DOXYCYCLINE HYCLATE 100 MG PO CAPS: 100.0000 mg | ORAL_CAPSULE | Freq: Two times a day (BID) | ORAL | 0 refills | Status: DC

## 2021-08-05 NOTE — Progress Notes (Signed)
GYNECOLOGY  VISIT  CC:   vulvar pain/cyst  HPI: 71 y.o. [id number] Married White or Caucasian female here for complaint of vulvar pain/cyst that started about a week ago.  Reports this started as an irritation.  She came home Friday after being in the [place] area over the weekend.  There is some soreness with the area.  Denies vaginal discharge or bleeding but has noted some vaginal odor.  Has issues with recurrent BV several years ago.  Does desire testing.  Has no urinary symptoms and denies fever.  Has been doing Sitz baths over the weekend.   Past Medical History:  Diagnosis Date   Anxiety    Arthritis    in neck   Bursitis of right hip    Carpal tunnel syndrome, bilateral    rt wrist   Complete rotator cuff tear of left shoulder    Eagle's syndrome    GERD (gastroesophageal reflux disease)    Hearing loss of both ears 12/25/2014   Has hearing aids/tubes    Heart murmur    as child   HNP (herniated nucleus pulposus)    Interstitial cystitis    Kidney cysts    Osteopenia    Plantar fasciitis    Spinal stenosis    Tingling    rt foot   Wears hearing aid     MEDS:   Current Outpatient Medications on File Prior to Visit  Medication Sig Dispense Refill   Ascorbic Acid (VITAMIN C) 100 MG tablet Take 1,000 mg by mouth daily.     Biotin 1 MG CAPS Take 1 capsule by mouth daily.      calcium carbonate (OS-CAL - DOSED IN MG OF ELEMENTAL CALCIUM) 1250 (500 Ca) MG tablet Take 1 tablet by mouth daily with breakfast.     cholecalciferol (VITAMIN D) 1000 UNITS tablet Take 3,000 Units by mouth daily.      estradiol (VIVELLE-DOT) 0.025 MG/24HR Place 1 patch onto the skin 2 (two) times a week. 24 patch 4   GABAPENTIN PO Take 100 mg by mouth at bedtime.     L-Arginine 1000 MG TABS Take 1 tablet by mouth daily.     omeprazole (PRILOSEC) 20 MG capsule TAKE 1 CAPSULE DAILY TO PREVENT HEARTBURN & INDIGESTION 90 capsule 3   Probiotic Product (PROBIOTIC PO) Take 1 tablet by mouth daily.      pyridOXINE (VITAMIN B-6) 100 MG tablet Take 100 mg by mouth daily.     vitamin B-12 (CYANOCOBALAMIN) 100 MCG tablet Take 1,000 mcg by mouth daily.     No current facility-administered medications on file prior to visit.    ALLERGIES: Other, Codeine, and Sulfa antibiotics  SH:  married, non smoker  Review of Systems  Genitourinary:        Tender, vulvar swelling    PHYSICAL EXAMINATION:    BP (!) 147/61 (BP Location: Left Arm, Patient Position: Sitting, Cuff Size: Normal)   Pulse 60   Ht 4' 10.5" (1.486 m) Comment: reported  Wt 118 lb 9.6 oz (53.8 kg)   [name] 10/18/2001   BMI 24.37 kg/m     General appearance: alert, cooperative and appears stated age Lymph:  no inguinal LAD noted  Pelvic: External genitalia:  erythema and mild swelling noted with area of firmness about 1cm in size, no fluctuance noted              Urethra:  normal appearing urethra with no masses, tenderness or lesions  Bartholins and Skenes: normal                 Vagina: normal appearing vagina with normal color and discharge, no lesions               Assessment/Plan: 1. Vulvar furuncle - will start antibiotics as no clear fluctuance noted.  Pt to give update in 48 hours in case needs recheck this week and/or I&D - doxycycline (VIBRAMYCIN) 100 MG capsule; Take 1 capsule (100 mg total) by mouth 2 (two) times daily. Take with food as can cause GI distress.  Dispense: 14 capsule; Refill: 0  2. Vaginal odor - Cervicovaginal ancillary only( [place])

## 2021-08-06 DIAGNOSIS — M25552 Pain in left hip: Secondary | ICD-10-CM | POA: Diagnosis not present

## 2021-08-06 LAB — CERVICOVAGINAL ANCILLARY ONLY
Bacterial Vaginitis (gardnerella): NEGATIVE
Candida Glabrata: NEGATIVE
Candida Vaginitis: NEGATIVE
Comment: NEGATIVE
Comment: NEGATIVE
Comment: NEGATIVE

## 2021-08-07 ENCOUNTER — Encounter (HOSPITAL_BASED_OUTPATIENT_CLINIC_OR_DEPARTMENT_OTHER): Payer: Self-pay | Admitting: Obstetrics & Gynecology

## 2021-08-08 ENCOUNTER — Encounter (HOSPITAL_BASED_OUTPATIENT_CLINIC_OR_DEPARTMENT_OTHER): Payer: Self-pay | Admitting: Obstetrics & Gynecology

## 2021-08-08 ENCOUNTER — Ambulatory Visit (HOSPITAL_BASED_OUTPATIENT_CLINIC_OR_DEPARTMENT_OTHER): Payer: PPO | Admitting: Obstetrics & Gynecology

## 2021-08-08 VITALS — BP 125/64 | HR 66 | Wt 118.6 lb

## 2021-08-08 DIAGNOSIS — N764 Abscess of vulva: Secondary | ICD-10-CM | POA: Diagnosis not present

## 2021-08-12 ENCOUNTER — Encounter (HOSPITAL_BASED_OUTPATIENT_CLINIC_OR_DEPARTMENT_OTHER): Payer: Self-pay | Admitting: Obstetrics & Gynecology

## 2021-08-12 ENCOUNTER — Other Ambulatory Visit (HOSPITAL_COMMUNITY)
Admission: RE | Admit: 2021-08-12 | Discharge: 2021-08-12 | Disposition: A | Discharge status: Home / Self Care | Payer: PPO | Source: Ambulatory Visit | Attending: Obstetrics & Gynecology | Admitting: Obstetrics & Gynecology

## 2021-08-12 ENCOUNTER — Ambulatory Visit (HOSPITAL_BASED_OUTPATIENT_CLINIC_OR_DEPARTMENT_OTHER): Payer: PPO | Admitting: Obstetrics & Gynecology

## 2021-08-12 VITALS — BP 134/64 | HR 80 | Wt 119.0 lb

## 2021-08-12 DIAGNOSIS — N898 Other specified noninflammatory disorders of vagina: Secondary | ICD-10-CM | POA: Insufficient documentation

## 2021-08-12 DIAGNOSIS — N764 Abscess of vulva: Secondary | ICD-10-CM

## 2021-08-12 DIAGNOSIS — R300 Dysuria: Secondary | ICD-10-CM | POA: Diagnosis not present

## 2021-08-12 LAB — POCT URINALYSIS DIPSTICK
Bilirubin, UA: NEGATIVE
Glucose, UA: NEGATIVE
Ketones, UA: NEGATIVE
Leukocytes, UA: NEGATIVE
Nitrite, UA: NEGATIVE
Protein, UA: NEGATIVE
Spec Grav, UA: 1.015 (ref 1.010–1.025)
Urobilinogen, UA: 0.2 E.U./dL
pH, UA: 6.5 (ref 5.0–8.0)

## 2021-08-12 MED ORDER — NITROFURANTOIN MONOHYD MACRO 100 MG PO CAPS: 100.0000 mg | ORAL_CAPSULE | Freq: Two times a day (BID) | ORAL | 0 refills | Status: DC

## 2021-08-13 ENCOUNTER — Encounter (HOSPITAL_BASED_OUTPATIENT_CLINIC_OR_DEPARTMENT_OTHER): Payer: Self-pay | Admitting: Obstetrics & Gynecology

## 2021-08-13 DIAGNOSIS — Z1231 Encounter for screening mammogram for malignant neoplasm of breast: Secondary | ICD-10-CM | POA: Diagnosis not present

## 2021-08-13 LAB — WOUND CULTURE

## 2021-08-13 LAB — CERVICOVAGINAL ANCILLARY ONLY
Bacterial Vaginitis (gardnerella): NEGATIVE
Candida Glabrata: NEGATIVE
Candida Vaginitis: NEGATIVE
Comment: NEGATIVE
Comment: NEGATIVE
Comment: NEGATIVE

## 2021-08-13 LAB — URINE CULTURE

## 2021-08-14 ENCOUNTER — Encounter (HOSPITAL_BASED_OUTPATIENT_CLINIC_OR_DEPARTMENT_OTHER): Payer: Self-pay | Admitting: Obstetrics & Gynecology

## 2021-08-14 ENCOUNTER — Other Ambulatory Visit (HOSPITAL_BASED_OUTPATIENT_CLINIC_OR_DEPARTMENT_OTHER): Payer: Self-pay | Admitting: *Deleted

## 2021-08-14 ENCOUNTER — Other Ambulatory Visit (HOSPITAL_BASED_OUTPATIENT_CLINIC_OR_DEPARTMENT_OTHER): Payer: Self-pay | Admitting: Obstetrics & Gynecology

## 2021-08-14 ENCOUNTER — Encounter (HOSPITAL_BASED_OUTPATIENT_CLINIC_OR_DEPARTMENT_OTHER): Payer: Self-pay | Admitting: *Deleted

## 2021-08-14 MED ORDER — HYDROCORTISONE ACETATE 25 MG RE SUPP: 25.0000 mg | Freq: Every day | RECTAL | 0 refills | Status: DC

## 2021-08-19 DIAGNOSIS — M1612 Unilateral primary osteoarthritis, left hip: Secondary | ICD-10-CM | POA: Diagnosis not present

## 2021-08-26 ENCOUNTER — Encounter (HOSPITAL_BASED_OUTPATIENT_CLINIC_OR_DEPARTMENT_OTHER): Payer: Self-pay | Admitting: *Deleted

## 2021-09-03 DIAGNOSIS — M205X2 Other deformities of toe(s) (acquired), left foot: Secondary | ICD-10-CM | POA: Diagnosis not present

## 2021-09-03 DIAGNOSIS — L03032 Cellulitis of left toe: Secondary | ICD-10-CM | POA: Diagnosis not present

## 2021-09-03 DIAGNOSIS — M205X1 Other deformities of toe(s) (acquired), right foot: Secondary | ICD-10-CM | POA: Diagnosis not present

## 2021-09-03 DIAGNOSIS — M2041 Other hammer toe(s) (acquired), right foot: Secondary | ICD-10-CM | POA: Diagnosis not present

## 2021-09-03 DIAGNOSIS — L600 Ingrowing nail: Secondary | ICD-10-CM | POA: Diagnosis not present

## 2021-09-03 DIAGNOSIS — M2042 Other hammer toe(s) (acquired), left foot: Secondary | ICD-10-CM | POA: Diagnosis not present

## 2021-09-17 DIAGNOSIS — M205X1 Other deformities of toe(s) (acquired), right foot: Secondary | ICD-10-CM | POA: Diagnosis not present

## 2021-09-17 DIAGNOSIS — M205X2 Other deformities of toe(s) (acquired), left foot: Secondary | ICD-10-CM | POA: Diagnosis not present

## 2021-09-17 DIAGNOSIS — M2041 Other hammer toe(s) (acquired), right foot: Secondary | ICD-10-CM | POA: Diagnosis not present

## 2021-09-17 DIAGNOSIS — L600 Ingrowing nail: Secondary | ICD-10-CM | POA: Diagnosis not present

## 2021-09-17 DIAGNOSIS — L03032 Cellulitis of left toe: Secondary | ICD-10-CM | POA: Diagnosis not present

## 2021-09-17 DIAGNOSIS — M2042 Other hammer toe(s) (acquired), left foot: Secondary | ICD-10-CM | POA: Diagnosis not present

## 2021-09-28 ENCOUNTER — Other Ambulatory Visit: Payer: Self-pay | Admitting: Internal Medicine

## 2021-09-28 ENCOUNTER — Encounter: Payer: Self-pay | Admitting: Internal Medicine

## 2021-09-28 DIAGNOSIS — K219 Gastro-esophageal reflux disease without esophagitis: Secondary | ICD-10-CM

## 2021-09-28 MED ORDER — OMEPRAZOLE 10 MG PO CPDR: DELAYED_RELEASE_CAPSULE | ORAL | 1 refills | Status: DC

## 2021-09-29 ENCOUNTER — Other Ambulatory Visit: Payer: Self-pay | Admitting: Internal Medicine

## 2021-09-29 ENCOUNTER — Encounter: Payer: Self-pay | Admitting: Internal Medicine

## 2021-09-29 DIAGNOSIS — N764 Abscess of vulva: Secondary | ICD-10-CM

## 2021-09-29 MED ORDER — DOXYCYCLINE HYCLATE 100 MG PO CAPS: ORAL_CAPSULE | ORAL | 0 refills | Status: DC

## 2021-10-02 NOTE — Progress Notes (Unsigned)
    Future Appointments  Date Time Provider Department  10/03/2021 11:30 AM Unk Pinto, MD [facility]  10/07/2021 10:15 AM Megan Salon, MD [facility]  11/21/2021  9:00 AM [name], NP [facility]    History of Present Illness:      Patient is a very nice 71 yo MWF with labile HTN, HLD, PreDM, Vi6t DDef who recently has had an ingrown toenail procedure and contacted after hours to procure an Abx for infection     Medications  Current Outpatient Medications (Endocrine & Metabolic):    estradiol (VIVELLE-DOT) 0.025 MG/24HR, Place 1 patch onto the skin 2 (two) times a week.     Current Outpatient Medications (Hematological):    vitamin B-12 (CYANOCOBALAMIN) 100 MCG tablet, Take 1,000 mcg by mouth daily.  Current Outpatient Medications (Other):    Ascorbic Acid (VITAMIN C) 100 MG tablet, Take 1,000 mg by mouth daily.   Biotin 1 MG CAPS, Take 1 capsule by mouth daily.    calcium carbonate (OS-CAL - DOSED IN MG OF ELEMENTAL CALCIUM) 1250 (500 Ca) MG tablet, Take 1 tablet by mouth daily with breakfast.   cholecalciferol (VITAMIN D) 1000 UNITS tablet, Take 3,000 Units by mouth daily.    doxycycline (VIBRAMYCIN) 100 MG capsule, Take  1 capsule  2 x /day  with Food  for Infection   GABAPENTIN PO, Take 100 mg by mouth at bedtime.   hydrocortisone (ANUSOL-HC) 25 MG suppository, Place 1 suppository (25 mg total) rectally daily. Place one suppository VAGINALLY daily for 12 days or until symptoms resolve   L-Arginine 1000 MG TABS, Take 1 tablet by mouth daily.   nitrofurantoin, macrocrystal-monohydrate, (MACROBID) 100 MG capsule, Take 1 capsule (100 mg total) by mouth 2 (two) times daily.   omeprazole (PRILOSEC) 10 MG capsule, Take 1 capsule Daily as needed to Prevent Heartburn & Indigestion  (  K21.9  )   Probiotic Product (PROBIOTIC PO), Take 1 tablet by mouth daily.   pyridOXINE (VITAMIN B-6) 100 MG tablet, Take 100 mg by mouth daily.  Problem list She has Interstitial  cystitis; GERD (gastroesophageal reflux disease); Hyperlipidemia; Hypertension; History of prediabetes; Vitamin D deficiency; Aortic atherosclerosis ([place]); Lumbar back pain with radiculopathy affecting right lower extremity; Osteopenia; and Hemorrhoids on their problem list.   Observations/Objective:  [name] 10/18/2001   HEENT - WNL. Neck - supple.  Chest - Clear equal BS. Cor - Nl HS. RRR w/o sig MGR. PP 1(+). No edema. MS- FROM w/o deformities.  Gait Nl. Neuro -  Nl w/o focal abnormalities.   Assessment and Plan:      Follow Up Instructions:        I discussed the assessment and treatment plan with the patient. The patient was provided an opportunity to ask questions and all were answered. The patient agreed with the plan and demonstrated an understanding of the instructions.       The patient was advised to call back or seek an in-person evaluation if the symptoms worsen or if the condition fails to improve as anticipated.    Kirtland Bouchard, MD

## 2021-10-03 ENCOUNTER — Ambulatory Visit: Payer: PPO | Admitting: Internal Medicine

## 2021-10-03 ENCOUNTER — Encounter: Payer: Self-pay | Admitting: Internal Medicine

## 2021-10-03 VITALS — BP 120/72 | HR 79 | Temp 97.9°F | Resp 16 | Ht 58.5 in | Wt 110.4 lb

## 2021-10-03 DIAGNOSIS — L600 Ingrowing nail: Secondary | ICD-10-CM

## 2021-10-07 ENCOUNTER — Encounter (HOSPITAL_BASED_OUTPATIENT_CLINIC_OR_DEPARTMENT_OTHER): Payer: Self-pay | Admitting: Obstetrics & Gynecology

## 2021-10-07 ENCOUNTER — Ambulatory Visit (INDEPENDENT_AMBULATORY_CARE_PROVIDER_SITE_OTHER): Payer: PPO | Admitting: Obstetrics & Gynecology

## 2021-10-07 VITALS — BP 139/72 | HR 62 | Ht 58.5 in | Wt 117.4 lb

## 2021-10-07 DIAGNOSIS — Z01419 Encounter for gynecological examination (general) (routine) without abnormal findings: Secondary | ICD-10-CM

## 2021-10-07 DIAGNOSIS — Z90710 Acquired absence of both cervix and uterus: Secondary | ICD-10-CM | POA: Diagnosis not present

## 2021-10-07 DIAGNOSIS — Z79890 Hormone replacement therapy (postmenopausal): Secondary | ICD-10-CM | POA: Diagnosis not present

## 2021-10-07 DIAGNOSIS — R300 Dysuria: Secondary | ICD-10-CM

## 2021-10-07 DIAGNOSIS — M8580 Other specified disorders of bone density and structure, unspecified site: Secondary | ICD-10-CM

## 2021-10-07 DIAGNOSIS — Z780 Asymptomatic menopausal state: Secondary | ICD-10-CM

## 2021-10-07 MED ORDER — NITROFURANTOIN MONOHYD MACRO 100 MG PO CAPS: 100.0000 mg | ORAL_CAPSULE | Freq: Two times a day (BID) | ORAL | 0 refills | Status: DC

## 2021-10-07 MED ORDER — ESTRADIOL 0.025 MG/24HR TD PTTW: 1.0000 | MEDICATED_PATCH | TRANSDERMAL | 4 refills | Status: DC

## 2021-10-07 NOTE — Progress Notes (Signed)
71 y.o. G29P1102 Married White or Caucasian female here for breast and pelvic exam.  I am also following her for h/o recurrent vaginitis.  Is currently on doxycyline for infection in toe where ingrown nail was removed.    Having some dysuria.  Has h/o IC so is hard for her to tell when has UTI.  Denies vaginal bleeding.  Is on low dosage HRT.  Does not desire to stop.    Patient's last menstrual period was 10/18/2001.          Sexually active: No.  H/O STD:  no  Health Maintenance: PCP:  Dr. Melford Aase.  Last wellness appt was 11/2020.  Did blood work at that appt: yes Vaccines are up to date:  tdap is not up to date Colonoscopy:  06/2018, follow up 5 years MMG:  04/2021 BMD:  2022 Last pap smear:  TAH with BSO 2003.   H/o abnormal pap smear:  remote hx   reports that she quit smoking about 34 years ago. Her smoking use included cigarettes. She has a 7.50 pack-year smoking history. She has never used smokeless tobacco. She reports current alcohol use of about 6.0 - 7.0 standard drinks of alcohol per week. She reports that she does not use drugs.  Past Medical History:  Diagnosis Date   Anxiety    Arthritis    in neck   Bursitis of right hip    Carpal tunnel syndrome, bilateral    rt wrist   Complete rotator cuff tear of left shoulder    Eagle's syndrome    GERD (gastroesophageal reflux disease)    Hearing loss of both ears 12/25/2014   Has hearing aids/tubes    Heart murmur    as child   HNP (herniated nucleus pulposus)    Ingrown toenail of right foot 2023   Interstitial cystitis    Kidney cysts    Osteopenia    Plantar fasciitis    Spinal stenosis    Tingling    rt foot   Wears hearing aid     Past Surgical History:  Procedure Laterality Date   ABDOMINAL HYSTERECTOMY  2003   BSO   ANAL SPHINCTEROTOMY     CARPAL TUNNEL RELEASE     LEFT   CERVICAL FUSION  02/25/2018   c1-c2 with laminectomy   CESAREAN SECTION     CYSTOSCOPY     HEMORRHOID SURGERY N/A 1998    KNEE ARTHROSCOPY Right 06/22/2019   Dr. Veverly Fells    LUMBAR LAMINECTOMY     LUMBAR LAMINECTOMY/DECOMPRESSION MICRODISCECTOMY N/A 08/09/2014   Procedure: MICRO LUMBAR DECOMPRESSION L3-4,  REDO DECOMPRESSION, MICRODISCECTOMY L4-5, FORAMINOTOMY L3-L4, L4-L5 BILATERAL;  Surgeon: Susa Day, MD;  Location: [facility];  Service: Orthopedics;  Laterality: N/A;   MYRINGOTOMY     NASAL SINUS SURGERY     TONSILLECTOMY     LEFT only, due to Eagle Syndrome   TONSILLECTOMY  10/14   right side due to Eagles syndrome   TRIGGER FINGER RELEASE     RIGHT   TYMPANOSTOMY TUBE PLACEMENT      Current Outpatient Medications  Medication Sig Dispense Refill   Ascorbic Acid (VITAMIN C) 100 MG tablet Take 1,000 mg by mouth daily.     Biotin 1 MG CAPS Take 1 capsule by mouth daily.      calcium carbonate (OS-CAL - DOSED IN MG OF ELEMENTAL CALCIUM) 1250 (500 Ca) MG tablet Take 1 tablet by mouth daily with breakfast.     cholecalciferol (VITAMIN D)  1000 UNITS tablet Take 3,000 Units by mouth daily.      doxycycline (VIBRAMYCIN) 100 MG capsule Take  1 capsule  2 x /day  with Food  for Infection 14 capsule 0   estradiol (VIVELLE-DOT) 0.025 MG/24HR Place 1 patch onto the skin 2 (two) times a week. 24 patch 4   GABAPENTIN PO Take 100 mg by mouth at bedtime.     L-Arginine 1000 MG TABS Take 1 tablet by mouth daily.     omeprazole (PRILOSEC) 10 MG capsule Take 1 capsule Daily as needed to Prevent Heartburn & Indigestion  (  K21.9  ) 90 capsule 1   Probiotic Product (PROBIOTIC PO) Take 1 tablet by mouth daily.     pyridOXINE (VITAMIN B-6) 100 MG tablet Take 100 mg by mouth daily.     vitamin B-12 (CYANOCOBALAMIN) 100 MCG tablet Take 1,000 mcg by mouth daily.     hydrocortisone (ANUSOL-HC) 25 MG suppository Place 1 suppository (25 mg total) rectally daily. Place one suppository VAGINALLY daily for 12 days or until symptoms resolve (Patient not taking: Reported on 10/07/2021) 12 suppository 0   nitrofurantoin,  macrocrystal-monohydrate, (MACROBID) 100 MG capsule Take 1 capsule (100 mg total) by mouth 2 (two) times daily. (Patient not taking: Reported on 10/07/2021) 10 capsule 0   No current facility-administered medications for this visit.    Family History  Problem Relation Age of Onset   Diabetes Mother    Hypertension Mother    Hyperlipidemia Mother    Osteoporosis Mother    Hypertension Brother    Hyperlipidemia Brother    Prostate cancer Father        Bone    Review of Systems  Constitutional: Negative.   Genitourinary:  Positive for dysuria.    Exam:   BP 139/72   Pulse 62   Ht 4' 10.5" (1.486 m)   Wt 117 lb 6.4 oz (53.3 kg)   [name] 10/18/2001   BMI 24.12 kg/m   Height: 4' 10.5" (148.6 cm)  General appearance: alert, cooperative and appears stated age Breasts: normal appearance, no masses or tenderness Abdomen: soft, non-tender; bowel sounds normal; no masses,  no organomegaly Lymph nodes: Cervical, supraclavicular, and axillary nodes normal.  No abnormal inguinal nodes palpated Neurologic: Grossly normal  Pelvic: External genitalia:  45m right anterior vaginal wall cyst              Urethra:  normal appearing urethra with no masses, tenderness or lesions              Bartholins and Skenes: normal                 Vagina: normal appearing vagina with atrophic changes and no discharge, no lesions              Cervix: absent              Pap taken: No. Bimanual Exam:  Uterus:  uterus absent              Adnexa: no mass, fullness, tenderness               Rectovaginal: Confirms               Anus:  normal sphincter tone, no lesions  Chaperone, AArlie Solomons CMA, was present for exam.  Assessment/Plan: 1. Encntr for gyn exam (general) (routine) w/o abn findings - Pap smear not indicated - Mammogram 07/2021 - Colonoscopy 2020.  Follow up 5 years. -  Bone mineral density 2022 - lab work done done with PCP - vaccines reviewed/updated.  Pt aware tdap is due.  2.  Postmenopausal  3. Dysuria - Urine Culture - nitrofurantoin, macrocrystal-monohydrate, (MACROBID) 100 MG capsule; Take 1 capsule (100 mg total) by mouth 2 (two) times daily.  Dispense: 10 capsule; Refill: 0  4. Hormone replacement therapy - estradiol (VIVELLE-DOT) 0.025 MG/24HR; Place 1 patch onto the skin 2 (two) times a week.  Dispense: 24 patch; Refill: 4  5. H/O: hysterectomy  6. Osteopenia, unspecified location  7.  Vaginal wall cyst - finding discussed with pt.  Will monitor.

## 2021-10-08 DIAGNOSIS — R300 Dysuria: Secondary | ICD-10-CM | POA: Diagnosis not present

## 2021-10-10 ENCOUNTER — Encounter (HOSPITAL_BASED_OUTPATIENT_CLINIC_OR_DEPARTMENT_OTHER): Payer: Self-pay | Admitting: Obstetrics & Gynecology

## 2021-10-10 LAB — URINE CULTURE

## 2021-10-11 ENCOUNTER — Encounter (HOSPITAL_BASED_OUTPATIENT_CLINIC_OR_DEPARTMENT_OTHER): Payer: Self-pay | Admitting: Obstetrics & Gynecology

## 2021-10-11 MED ORDER — AMOXICILLIN-POT CLAVULANATE 500-125 MG PO TABS: 1.0000 | ORAL_TABLET | Freq: Two times a day (BID) | ORAL | 0 refills | Status: DC

## 2021-10-11 MED ORDER — FLUCONAZOLE 150 MG PO TABS: 150.0000 mg | ORAL_TABLET | Freq: Once | ORAL | 0 refills | Status: AC

## 2021-10-11 NOTE — Addendum Note (Signed)
Addended by: Megan Salon on: 10/11/2021 06:17 AM   Modules accepted: Orders

## 2021-10-14 ENCOUNTER — Encounter (HOSPITAL_BASED_OUTPATIENT_CLINIC_OR_DEPARTMENT_OTHER): Payer: Self-pay | Admitting: Obstetrics & Gynecology

## 2021-10-15 ENCOUNTER — Encounter (HOSPITAL_BASED_OUTPATIENT_CLINIC_OR_DEPARTMENT_OTHER): Payer: Self-pay | Admitting: Obstetrics & Gynecology

## 2021-10-15 ENCOUNTER — Other Ambulatory Visit (HOSPITAL_BASED_OUTPATIENT_CLINIC_OR_DEPARTMENT_OTHER): Payer: Self-pay | Admitting: Obstetrics & Gynecology

## 2021-10-15 ENCOUNTER — Ambulatory Visit (INDEPENDENT_AMBULATORY_CARE_PROVIDER_SITE_OTHER): Payer: PPO

## 2021-10-15 DIAGNOSIS — M545 Low back pain, unspecified: Secondary | ICD-10-CM

## 2021-10-15 DIAGNOSIS — Z87440 Personal history of urinary (tract) infections: Secondary | ICD-10-CM | POA: Diagnosis not present

## 2021-10-15 DIAGNOSIS — R300 Dysuria: Secondary | ICD-10-CM | POA: Diagnosis not present

## 2021-10-15 LAB — POCT URINALYSIS DIPSTICK
Bilirubin, UA: NEGATIVE
Blood, UA: NEGATIVE
Glucose, UA: NEGATIVE
Ketones, UA: NEGATIVE
Leukocytes, UA: NEGATIVE
Nitrite, UA: NEGATIVE
Protein, UA: NEGATIVE
Spec Grav, UA: 1.015 (ref 1.010–1.025)
Urobilinogen, UA: 0.2 E.U./dL
pH, UA: 7.5 (ref 5.0–8.0)

## 2021-10-15 NOTE — Progress Notes (Signed)
E 

## 2021-10-15 NOTE — Progress Notes (Unsigned)
Patient came in today with complaints of still having lower back pain, bloating and "gas" pain. Patient states that she was taking Macrobid but was later changes to Augmentin. Symptoms are still present. tbw

## 2021-10-16 ENCOUNTER — Ambulatory Visit (HOSPITAL_BASED_OUTPATIENT_CLINIC_OR_DEPARTMENT_OTHER): Payer: PPO

## 2021-10-17 LAB — URINE CULTURE

## 2021-10-18 ENCOUNTER — Encounter (HOSPITAL_BASED_OUTPATIENT_CLINIC_OR_DEPARTMENT_OTHER): Payer: Self-pay | Admitting: Obstetrics & Gynecology

## 2021-10-23 ENCOUNTER — Other Ambulatory Visit: Payer: Self-pay | Admitting: Internal Medicine

## 2021-10-23 ENCOUNTER — Encounter: Payer: Self-pay | Admitting: Nurse Practitioner

## 2021-10-23 DIAGNOSIS — K219 Gastro-esophageal reflux disease without esophagitis: Secondary | ICD-10-CM

## 2021-10-24 ENCOUNTER — Ambulatory Visit (INDEPENDENT_AMBULATORY_CARE_PROVIDER_SITE_OTHER): Payer: PPO | Admitting: *Deleted

## 2021-10-24 ENCOUNTER — Other Ambulatory Visit (HOSPITAL_COMMUNITY)
Admission: RE | Admit: 2021-10-24 | Discharge: 2021-10-24 | Disposition: A | Discharge status: Home / Self Care | Payer: PPO | Source: Ambulatory Visit | Attending: Obstetrics & Gynecology | Admitting: Obstetrics & Gynecology

## 2021-10-24 DIAGNOSIS — N898 Other specified noninflammatory disorders of vagina: Secondary | ICD-10-CM | POA: Insufficient documentation

## 2021-10-24 NOTE — Progress Notes (Signed)
Pt presents to office with complaints of white discharge and burning sensation. Pt instructed on performing self swab, which pt then collected. Advised that we would contact her with results.

## 2021-10-25 LAB — CERVICOVAGINAL ANCILLARY ONLY
Bacterial Vaginitis (gardnerella): POSITIVE — AB
Candida Glabrata: NEGATIVE
Candida Vaginitis: NEGATIVE
Comment: NEGATIVE
Comment: NEGATIVE
Comment: NEGATIVE

## 2021-10-28 ENCOUNTER — Other Ambulatory Visit (HOSPITAL_BASED_OUTPATIENT_CLINIC_OR_DEPARTMENT_OTHER): Payer: Self-pay | Admitting: *Deleted

## 2021-10-28 ENCOUNTER — Encounter (HOSPITAL_BASED_OUTPATIENT_CLINIC_OR_DEPARTMENT_OTHER): Payer: Self-pay | Admitting: Obstetrics & Gynecology

## 2021-10-28 MED ORDER — METRONIDAZOLE 500 MG PO TABS: 500.0000 mg | ORAL_TABLET | Freq: Two times a day (BID) | ORAL | 0 refills | Status: DC

## 2021-10-28 MED ORDER — METRONIDAZOLE 0.75 % VA GEL: 1.0000 | Freq: Every day | VAGINAL | 0 refills | Status: DC

## 2021-10-28 NOTE — Progress Notes (Signed)
Pt requests that rx for metronidazole be changed from pill to gel. Rx changed

## 2021-11-01 DIAGNOSIS — M1711 Unilateral primary osteoarthritis, right knee: Secondary | ICD-10-CM | POA: Diagnosis not present

## 2021-11-01 DIAGNOSIS — M25561 Pain in right knee: Secondary | ICD-10-CM | POA: Diagnosis not present

## 2021-11-11 ENCOUNTER — Other Ambulatory Visit (HOSPITAL_COMMUNITY)
Admission: RE | Admit: 2021-11-11 | Discharge: 2021-11-11 | Disposition: A | Discharge status: Home / Self Care | Payer: PPO | Source: Ambulatory Visit | Attending: Obstetrics & Gynecology | Admitting: Obstetrics & Gynecology

## 2021-11-11 ENCOUNTER — Encounter (HOSPITAL_BASED_OUTPATIENT_CLINIC_OR_DEPARTMENT_OTHER): Payer: Self-pay | Admitting: Obstetrics & Gynecology

## 2021-11-11 ENCOUNTER — Ambulatory Visit (INDEPENDENT_AMBULATORY_CARE_PROVIDER_SITE_OTHER): Payer: PPO

## 2021-11-11 DIAGNOSIS — N898 Other specified noninflammatory disorders of vagina: Secondary | ICD-10-CM

## 2021-11-11 DIAGNOSIS — R300 Dysuria: Secondary | ICD-10-CM | POA: Diagnosis not present

## 2021-11-11 LAB — POCT URINALYSIS DIPSTICK
Bilirubin, UA: NEGATIVE
Blood, UA: NEGATIVE
Glucose, UA: NEGATIVE
Ketones, UA: NEGATIVE
Leukocytes, UA: NEGATIVE
Nitrite, UA: NEGATIVE
Protein, UA: NEGATIVE
Spec Grav, UA: 1.025
Urobilinogen, UA: 0.2 U/dL
pH, UA: 6.5

## 2021-11-11 NOTE — Progress Notes (Signed)
Patient came in today to do aptima swab for vaginal itching and discharge. Patient also wanted her urine check.She states she has burning while urinating. tbw

## 2021-11-12 DIAGNOSIS — M5416 Radiculopathy, lumbar region: Secondary | ICD-10-CM | POA: Diagnosis not present

## 2021-11-12 DIAGNOSIS — R300 Dysuria: Secondary | ICD-10-CM | POA: Diagnosis not present

## 2021-11-13 ENCOUNTER — Encounter (HOSPITAL_BASED_OUTPATIENT_CLINIC_OR_DEPARTMENT_OTHER): Payer: Self-pay | Admitting: Obstetrics & Gynecology

## 2021-11-13 LAB — CERVICOVAGINAL ANCILLARY ONLY
Bacterial Vaginitis (gardnerella): POSITIVE — AB
Candida Glabrata: NEGATIVE
Candida Vaginitis: NEGATIVE
Comment: NEGATIVE
Comment: NEGATIVE
Comment: NEGATIVE

## 2021-11-14 ENCOUNTER — Ambulatory Visit: Payer: PPO | Admitting: Podiatry

## 2021-11-14 DIAGNOSIS — L600 Ingrowing nail: Secondary | ICD-10-CM

## 2021-11-14 NOTE — Progress Notes (Signed)
Subjective:   Patient ID: Helen Glover, female   DOB: 71 y.o.   MRN: [id number]   HPI Chief Complaint  Patient presents with   Ingrown Toenail    Right great toe, ingrown nail, pain- burning and stinging sensation to right hallux. Ingrown procedure done in 08/2021.    71 year old female presents with the above concerns.  She states that she had a procedure performed for ingrown toenail she is not sure what exposed to look like.  She still having tenderness but is getting somewhat better.  She was put on antibiotics previously which did help.  No swelling redness or drainage.  She also has sciatic issues and she saw her neurosurgeon yesterday and she does not think that that is what is causing the pain to her toe.  Review of Systems  All other systems reviewed and are negative.  Past Medical History:  Diagnosis Date   Anxiety    Arthritis    in neck   Bursitis of right hip    Carpal tunnel syndrome, bilateral    rt wrist   Complete rotator cuff tear of left shoulder    Eagle's syndrome    GERD (gastroesophageal reflux disease)    Hearing loss of both ears 12/25/2014   Has hearing aids/tubes    Heart murmur    as child   HNP (herniated nucleus pulposus)    Ingrown toenail of right foot 2023   Interstitial cystitis    Kidney cysts    Osteopenia    Plantar fasciitis    Spinal stenosis    Tingling    rt foot   Wears hearing aid     Past Surgical History:  Procedure Laterality Date   ABDOMINAL HYSTERECTOMY  2003   BSO   ANAL SPHINCTEROTOMY     CARPAL TUNNEL RELEASE     LEFT   CERVICAL FUSION  02/25/2018   c1-c2 with laminectomy   CESAREAN SECTION     CYSTOSCOPY     HEMORRHOID SURGERY N/A 1998   KNEE ARTHROSCOPY Right 06/22/2019   Dr. Veverly Fells    LUMBAR LAMINECTOMY     LUMBAR LAMINECTOMY/DECOMPRESSION MICRODISCECTOMY N/A 08/09/2014   Procedure: MICRO LUMBAR DECOMPRESSION L3-4,  REDO DECOMPRESSION, MICRODISCECTOMY L4-5, FORAMINOTOMY L3-L4, L4-L5 BILATERAL;  Surgeon:  Susa Day, MD;  Location: [facility];  Service: Orthopedics;  Laterality: N/A;   MYRINGOTOMY     NASAL SINUS SURGERY     TONSILLECTOMY     LEFT only, due to Eagle Syndrome   TONSILLECTOMY  10/14   right side due to Eagles syndrome   TRIGGER FINGER RELEASE     RIGHT   TYMPANOSTOMY TUBE PLACEMENT       Current Outpatient Medications:    amoxicillin-clavulanate (AUGMENTIN) 500-125 MG tablet, Take 1 tablet (500 mg total) by mouth 2 (two) times daily., Disp: 14 tablet, Rfl: 0   Ascorbic Acid (VITAMIN C) 100 MG tablet, Take 1,000 mg by mouth daily., Disp: , Rfl:    Biotin 1 MG CAPS, Take 1 capsule by mouth daily. , Disp: , Rfl:    calcium carbonate (OS-CAL - DOSED IN MG OF ELEMENTAL CALCIUM) 1250 (500 Ca) MG tablet, Take 1 tablet by mouth daily with breakfast., Disp: , Rfl:    cholecalciferol (VITAMIN D) 1000 UNITS tablet, Take 3,000 Units by mouth daily. , Disp: , Rfl:    ciprofloxacin (CIPRO) 250 MG tablet, Take 2 tablets (500 mg total) by mouth 2 (two) times daily., Disp: 6 tablet, Rfl: 0  doxycycline (VIBRAMYCIN) 100 MG capsule, Take  1 capsule  2 x /day  with Food  for Infection, Disp: 14 capsule, Rfl: 0   estradiol (VIVELLE-DOT) 0.025 MG/24HR, Place 1 patch onto the skin 2 (two) times a week., Disp: 24 patch, Rfl: 4   GABAPENTIN PO, Take 100 mg by mouth at bedtime., Disp: , Rfl:    hydrocortisone (ANUSOL-HC) 25 MG suppository, Place 1 suppository (25 mg total) rectally daily. Place one suppository VAGINALLY daily for 12 days or until symptoms resolve (Patient not taking: Reported on 10/07/2021), Disp: 12 suppository, Rfl: 0   L-Arginine 1000 MG TABS, Take 1 tablet by mouth daily., Disp: , Rfl:    metroNIDAZOLE (METROGEL) 0.75 % vaginal gel, Place 1 Applicatorful vaginally at bedtime. Then use place one applicator vaginally twice weekly for 3 months., Disp: 70 g, Rfl: 3   omeprazole (PRILOSEC) 20 MG capsule, Take  1 capsule  Daily  to Prevent Heartburn & Indigestion  (Dx:  k21.9  ), Disp: 90  capsule, Rfl: 3   Probiotic Product (PROBIOTIC PO), Take 1 tablet by mouth daily., Disp: , Rfl:    pyridOXINE (VITAMIN B-6) 100 MG tablet, Take 100 mg by mouth daily., Disp: , Rfl:    vitamin B-12 (CYANOCOBALAMIN) 100 MCG tablet, Take 1,000 mcg by mouth daily., Disp: , Rfl:   Allergies  Allergen Reactions   Other     Band-aid   Codeine Hives   Sulfa Antibiotics Nausea And Vomiting           Objective:  Physical Exam  General: AAO x3, NAD  Dermatological: Status post partial nail avulsion right hallux.  The nail somewhat dystrophic and there is a transverse ridge at the proximal nail fold.  There is small mount of nail present within the nail border that was previously removed.  No drainage or pus.  No erythema.  No ascending cellulitis.  No fluctuance.  Vascular: Dorsalis Pedis artery and Posterior Tibial artery pedal pulses are 2/4 bilateral with immedate capillary fill time.  There is no pain with calf compression, swelling, warmth, erythema.   Neruologic: Grossly intact via light touch bilateral.   Musculoskeletal: Mild tenderness palpation of the nail border.  Gait: Unassisted, Nonantalgic.       Assessment:   Ingrown toenail     Plan:  -Treatment options discussed including all alternatives, risks, and complications -Etiology of symptoms were discussed -We discussed possible repeat partial nail avulsion but ultimately she went in on this today.  Able to debride a small piece of the nail that was still present.  Recommend Epsom salt soaks and monitoring signs or symptoms of infection.  Trula Slade DPM

## 2021-11-14 NOTE — Patient Instructions (Signed)

## 2021-11-15 ENCOUNTER — Encounter (HOSPITAL_BASED_OUTPATIENT_CLINIC_OR_DEPARTMENT_OTHER): Payer: Self-pay | Admitting: Obstetrics & Gynecology

## 2021-11-15 ENCOUNTER — Other Ambulatory Visit (HOSPITAL_BASED_OUTPATIENT_CLINIC_OR_DEPARTMENT_OTHER): Payer: Self-pay | Admitting: Obstetrics & Gynecology

## 2021-11-15 LAB — URINE CULTURE

## 2021-11-15 MED ORDER — METRONIDAZOLE 0.75 % VA GEL: 1.0000 | Freq: Every day | VAGINAL | 3 refills | Status: DC

## 2021-11-15 MED ORDER — CIPROFLOXACIN HCL 250 MG PO TABS: 500.0000 mg | ORAL_TABLET | Freq: Two times a day (BID) | ORAL | 0 refills | Status: DC

## 2021-11-16 ENCOUNTER — Other Ambulatory Visit (HOSPITAL_BASED_OUTPATIENT_CLINIC_OR_DEPARTMENT_OTHER): Payer: Self-pay | Admitting: Obstetrics & Gynecology

## 2021-11-16 MED ORDER — CIPROFLOXACIN HCL 250 MG PO TABS: 250.0000 mg | ORAL_TABLET | Freq: Two times a day (BID) | ORAL | 0 refills | Status: AC

## 2021-11-19 ENCOUNTER — Encounter (HOSPITAL_BASED_OUTPATIENT_CLINIC_OR_DEPARTMENT_OTHER): Payer: Self-pay | Admitting: Obstetrics & Gynecology

## 2021-11-19 ENCOUNTER — Other Ambulatory Visit (HOSPITAL_BASED_OUTPATIENT_CLINIC_OR_DEPARTMENT_OTHER): Payer: Self-pay | Admitting: *Deleted

## 2021-11-19 DIAGNOSIS — M170 Bilateral primary osteoarthritis of knee: Secondary | ICD-10-CM | POA: Diagnosis not present

## 2021-11-19 DIAGNOSIS — M25562 Pain in left knee: Secondary | ICD-10-CM | POA: Diagnosis not present

## 2021-11-19 MED ORDER — NITROFURANTOIN MONOHYD MACRO 100 MG PO CAPS: 100.0000 mg | ORAL_CAPSULE | Freq: Two times a day (BID) | ORAL | 0 refills | Status: AC

## 2021-11-19 NOTE — Progress Notes (Signed)
Rx sent to pharmacy for macrobid

## 2021-11-20 ENCOUNTER — Encounter: Payer: PPO | Admitting: Nurse Practitioner

## 2021-11-20 NOTE — Progress Notes (Unsigned)
COMPLETE PHYSICAL EXAMINATION  Encounter for General Medical Examination with Abnormal Findings 1 year  Essential hypertension No medications at this time Monitor blood pressure  Continue DASH diet.   Reminder to go to the ER if any CP, SOB, nausea, dizziness, severe HA, changes vision/speech, left arm numbness and tingling and jaw pain. CBC CMP  Gastroesophageal reflux disease, esophagitis presence not specified Taking omeprazole in AM and reports well controlled Discussed diet, avoiding triggers and other lifestyle changes  Osteopenia Last DEXA 07/30/20 t score -1.9 Continue Vit D, calcium and weight bearing exercises  Abnormal Glucose A1Cs well controlled in recent years Continue diet and exercise A1C  Hyperlipidemia, mixed Moderate elevations wants to continue to try lifestyle modifications before initiating medication Discussed dietary and exercise modifications Check lipids, plan to initiate med if LDL remains significantly elevated near 130 CMP   Medication management CMP   Interstitial cystitis  managing with lifestyle, follows urology  Aortic atherosclerosis ([place]) Control blood pressure, cholesterol, glucose, increase exercise.   Alcohol use Has been cutting back dramatically, using only on weekends, trying to limit Will try to cut back with therapist and using hot tea/substitue No history of DT's/withdrawals Continue therapy  Medication management -     Magnesium  Acute pain of right knee Will refer to [facility] out[facility] PT , continue to follow with Dr. Veverly Fells  Vitamin D deficiency -     VITAMIN D 25 Hydroxy (Vit-D Deficiency, Fractures) Continue Vit D supplementation  Screening for ischemic heart disease EKG  Screening for AAA - ABD U/S LTD RETROPERITONEAL  Screening for hematuria/proteinuria Microalbumin/creatinine urine ratio Routine U/A with reflex microscopic  I provided 40 minutes of non-face-to-face time during this encounter including  counseling, chart review, and critical decision making was preformed.   Future Appointments  Date Time Provider [place]  12/02/2021  1:30 PM [facility] NURSE [facility]  11/25/2022  9:00 AM [name], NP [facility] None      Subjective:  Helen Glover is a 71 y.o. female who presents for Medicare Annual Wellness Visit and follow up for HTN, hyperlipidemia, prediabetes, GERD and vitamin D Def.   She is currently being treated for yeast and BV through gynecology. Currently on Macrobid for UTI x 2 days   She had removal of ingrown toenail that became infected and took doxycycline and is following with podiatry.   She has almost completely stopped drinking alcohol. She continues to have stress with her husband, tried marriage counseling and didn't work, continues her personal counseling  02/25/18 patient had C1-C2 posterior laminectomy and fusion by Dr Matthew Saras, doing well since.  She has spinal stenosis with radiculopathy followed by Dr.Jones, getting injections with immense benefit. Follows with Dr. Veverly Fells for knee, had R knee arthroscopy 06/22/2019. Right knee has been giving her more pain in the past few weeks and Dr. Veverly Fells has recommended PT- she would like to go to [facility] so will place a referral  She is taking omeprazole $RemoveBeforeDEI'20mg'cznCflGzWbrymmDv$  in the morning.  She is also taking a probiotic tablet daily.  She had hysterectomy and continues on low dose estradiol patch, follows with GYN.   Interstitial cystitis - patient states mild now that lower stress since retired, managing with lifestyle only.   BMI is Body mass index is 23.37 kg/m., she has been working on diet and exercise. She has lost 6 pounds in the past 2 months Wt Readings from Last 3 Encounters:  11/21/21 111 lb 12.8 oz (50.7 kg)  10/07/21  117 lb 6.4 oz (53.3 kg)  10/03/21 110 lb 6.4 oz (50.1 kg)   She does not check BP at home, not on any meds, today their BP is BP: 130/64 BP Readings from Last 3  Encounters:  11/21/21 130/64  10/07/21 139/72  10/03/21 120/72     She does not workout. She denies chest pain, shortness of breath, dizziness.  She is not on cholesterol medication.  Her cholesterol is not at goal. Continues to focus on diet and exercise- walking and stretching. The cholesterol last visit was:   Lab Results  Component Value Date   CHOL 231 (H) 07/11/2021   HDL 104 07/11/2021   LDLCALC 110 (H) 07/11/2021   TRIG 81 07/11/2021   CHOLHDL 2.2 07/11/2021   She has been working on diet and exercise for hx of prediabetes (A1C 5.7% in 2014), and denies hyperglycemia, hypoglycemia , increased appetite, nausea, paresthesia of the feet, polydipsia, polyuria, visual disturbances, vomiting and weight loss. Last A1C in the office was:  Lab Results  Component Value Date   HGBA1C 5.3 07/11/2021   Last GFR Lab Results  Component Value Date   EGFR 94 07/11/2021    Patient is on Vitamin D supplement.   Lab Results  Component Value Date   VD25OH 76 11/20/2020      Medication Review:  Current Outpatient Medications (Endocrine & Metabolic):    estradiol (VIVELLE-DOT) 0.025 MG/24HR, Place 1 patch onto the skin 2 (two) times a week.    Current Outpatient Medications (Analgesics):    meloxicam (MOBIC) 15 MG tablet, Take 1 tablet by mouth daily.  Current Outpatient Medications (Hematological):    vitamin B-12 (CYANOCOBALAMIN) 100 MCG tablet, Take 1,000 mcg by mouth daily.  Current Outpatient Medications (Other):    Ascorbic Acid (VITAMIN C) 100 MG tablet, Take 1,000 mg by mouth daily.   Biotin 1 MG CAPS, Take 1 capsule by mouth daily.    calcium carbonate (OS-CAL - DOSED IN MG OF ELEMENTAL CALCIUM) 1250 (500 Ca) MG tablet, Take 1 tablet by mouth daily with breakfast.   cholecalciferol (VITAMIN D) 1000 UNITS tablet, Take 3,000 Units by mouth daily.    fluconazole (DIFLUCAN) 150 MG tablet, Take 1 tablet by mouth once.   GABAPENTIN PO, Take 100 mg by mouth at bedtime.    L-Arginine 1000 MG TABS, Take 1 tablet by mouth daily.   nitrofurantoin, macrocrystal-monohydrate, (MACROBID) 100 MG capsule, Take 1 capsule (100 mg total) by mouth 2 (two) times daily for 5 days.   omeprazole (PRILOSEC) 20 MG capsule, Take  1 capsule  Daily  to Prevent Heartburn & Indigestion  (Dx:  k21.9  )   Probiotic Product (PROBIOTIC PO), Take 1 tablet by mouth daily.   pyridOXINE (VITAMIN B-6) 100 MG tablet, Take 100 mg by mouth daily.   doxycycline (VIBRAMYCIN) 100 MG capsule, TAKE 1 CAPSULE 2 TIMES A DAY WITH FOOD FOR INFECTION   metroNIDAZOLE (METROGEL) 0.75 % vaginal gel, Place 1 Applicatorful vaginally at bedtime. Then use place one applicator vaginally twice weekly for 3 months. (Patient not taking: Reported on 11/21/2021)  Allergies: Allergies  Allergen Reactions   Other     Band-aid   Codeine Hives   Sulfa Antibiotics Nausea And Vomiting    Current Problems (verified) has Interstitial cystitis; GERD (gastroesophageal reflux disease); Hyperlipidemia; Hypertension; History of prediabetes; Vitamin D deficiency; Aortic atherosclerosis ([place]); Lumbar back pain with radiculopathy affecting right lower extremity; Osteopenia; and Hemorrhoids on their problem list.  Screening Tests Health Maintenance  Topic Date Due   TETANUS/TDAP  07/12/2022 (Originally 09/23/2020)   COVID-19 Vaccine (7 - Pfizer risk series) 12/31/2021   MAMMOGRAM  08/14/2022   COLONOSCOPY (Pts 45-54yrs Insurance coverage will need to be confirmed)  07/14/2023   Pneumonia Vaccine 55+ Years old  Completed   INFLUENZA VACCINE  Completed   DEXA SCAN  Completed   Hepatitis C Screening  Completed   Zoster Vaccines- Shingrix  Completed   HPV VACCINES  Aged Out    Last colonoscopy: 06/2018 Dr. Paulita Fujita due 5 years Last mammogram: 07/30/20 [facility] Last pap smear/pelvic exam: 06/27/2013 Dr. Sabra Heck, DONE paps, TAH, pelvic only DEXA: 07/30/20 mild osteopenia AB [place] 10/2016 CT AB 07/2018 - aortic atherosclerosis    Prior  vaccinations: TD or Tdap: 2012  Influenza: 10/2018  Pneumococcal: 12/25/2016 Prevnar13: 2015 Shingles/Zostavax: 2007 Shingrix 2018, reports [facility] Covid 19: 2/2, 2021, pfizer   Names of Other Physician/Practitioners you currently use: 1. [facility] here for primary care 2. Donato Heinz, eye doctor, last visit 2020, wears glasses, scheduled in June 2021 3. Dr. Toy Cookey, dentist, last visit 2021  Patient Care Team: Unk Pinto, MD as PCP - General (Internal Medicine) Barbaraann Cao, OD as Referring Physician (Optometry) Marlou Sa, Tonna Corner, MD as Consulting Physician (Orthopedic Surgery) Berenice Primas, MD as Referring Physician (Orthopedic Surgery) Megan Salon, MD as Consulting Physician (Gynecology) Ardis Hughs, MD as Attending Physician (Urology) Leta Baptist, MD as Consulting Physician (Otolaryngology) Arta Silence, MD as Consulting Physician (Gastroenterology) Newton Pigg, [facility] as Pharmacist (Pharmacist)  Surgical: She  has a past surgical history that includes Cesarean section; Myringotomy; Lumbar laminectomy; Carpal tunnel release; Trigger finger release; Anal sphincterotomy; Nasal sinus surgery; Abdominal hysterectomy (2003); Hemorrhoid surgery (N/A, 1998); Cystoscopy; Tympanostomy tube placement; Tonsillectomy; Tonsillectomy (10/14); Lumbar laminectomy/decompression microdiscectomy (N/A, 08/09/2014); Cervical fusion (02/25/2018); and Knee arthroscopy (Right, 06/22/2019). Family Her family history includes Diabetes in her mother; Hyperlipidemia in her brother and mother; Hypertension in her brother and mother; Osteoporosis in her mother; Prostate cancer in her father. Social history  She reports that she quit smoking about 34 years ago. Her smoking use included cigarettes. She has a 7.50 pack-year smoking history. She has never used smokeless tobacco. She reports current alcohol use of about 6.0 - 7.0 standard  drinks of alcohol per week. She reports that she does not use drugs.  Review of Systems  Constitutional:  Positive for malaise/fatigue. Negative for chills and fever.  HENT:  Positive for congestion and sinus pain (right maxillary, having CT). Negative for hearing loss, sore throat and tinnitus.   Eyes:  Negative for blurred vision and double vision.  Respiratory:  Negative for cough, hemoptysis, sputum production, shortness of breath and wheezing.   Cardiovascular:  Negative for chest pain, palpitations and leg swelling.  Gastrointestinal:  Positive for heartburn (uses omeprazole). Negative for abdominal pain, constipation, diarrhea, nausea and vomiting.       Had loose stools which is improving  Genitourinary:  Negative for dysuria and urgency.  Musculoskeletal:  Negative for back pain, falls, joint pain, myalgias and neck pain.  Skin:  Negative for rash.  Neurological:  Negative for dizziness, tingling, tremors, weakness and headaches.  Endo/Heme/Allergies:  Does not bruise/bleed easily.  Psychiatric/Behavioral:  Negative for depression and suicidal ideas. The patient is not nervous/anxious and does not have insomnia.     Objective:   Today's Vitals   11/21/21 0911  BP: 130/64  Pulse: 77  Temp: (!) 97.3 F (36.3  C)  SpO2: 97%  Weight: 111 lb 12.8 oz (50.7 kg)  Height: $Remove'4\' 10"'VjqCiXP$  (1.473 m)    Body mass index is 23.37 kg/m.  General appearance: alert, no distress, WD/WN, female HEENT: normocephalic, sclerae anicteric, right TM no bulging, left TM with perforation, nares patent, no discharge or erythema, pharynx normal Oral cavity: MMM, no lesions Sinuses: right maxillary tenderness Neck: supple, no lymphadenopathy, no thyromegaly, no masses Heart: RRR, normal S1, S2, no murmurs Lungs: CTA bilaterally, no wheezes, rhonchi, or rales Breast: defer to GYN Abdomen: +bs, soft, non tender, non distended, no masses, no hepatomegaly, no splenomegaly Musculoskeletal: nontender, no  swelling, no obvious deformity, Left palmar hand at 4th and 5th digit, hard fibrous cord with no obvious restriction/decreased strength on exam.  Extremities: no edema, no cyanosis, no clubbing Pulses: 2+ symmetric, upper and lower extremities, normal cap refill Neurological: alert, oriented x 3, CN2-12 intact, strength normal upper extremities and lower extremities, sensation normal throughout, DTRs 2+ throughout, no cerebellar signs, gait normal GU: defer to GYN Psychiatric: normal affect, behavior normal, pleasant  EKG: NSR, IRBBB AAA: <3cm   Kanyia Heaslip Mikki Santee, NP 9:27 AM [facility]

## 2021-11-21 ENCOUNTER — Encounter: Payer: Self-pay | Admitting: Nurse Practitioner

## 2021-11-21 ENCOUNTER — Ambulatory Visit (INDEPENDENT_AMBULATORY_CARE_PROVIDER_SITE_OTHER): Payer: PPO | Admitting: Nurse Practitioner

## 2021-11-21 VITALS — BP 130/64 | HR 77 | Temp 97.3°F | Ht <= 58 in | Wt 111.8 lb

## 2021-11-21 DIAGNOSIS — Z1389 Encounter for screening for other disorder: Secondary | ICD-10-CM

## 2021-11-21 DIAGNOSIS — R7309 Other abnormal glucose: Secondary | ICD-10-CM | POA: Diagnosis not present

## 2021-11-21 DIAGNOSIS — I700 Atherosclerosis of aorta: Secondary | ICD-10-CM

## 2021-11-21 DIAGNOSIS — Z0000 Encounter for general adult medical examination without abnormal findings: Secondary | ICD-10-CM | POA: Diagnosis not present

## 2021-11-21 DIAGNOSIS — Z136 Encounter for screening for cardiovascular disorders: Secondary | ICD-10-CM | POA: Diagnosis not present

## 2021-11-21 DIAGNOSIS — E559 Vitamin D deficiency, unspecified: Secondary | ICD-10-CM

## 2021-11-21 DIAGNOSIS — F1090 Alcohol use, unspecified, uncomplicated: Secondary | ICD-10-CM

## 2021-11-21 DIAGNOSIS — Z0001 Encounter for general adult medical examination with abnormal findings: Secondary | ICD-10-CM

## 2021-11-21 DIAGNOSIS — M25561 Pain in right knee: Secondary | ICD-10-CM

## 2021-11-21 DIAGNOSIS — K219 Gastro-esophageal reflux disease without esophagitis: Secondary | ICD-10-CM

## 2021-11-21 DIAGNOSIS — Z789 Other specified health status: Secondary | ICD-10-CM

## 2021-11-21 DIAGNOSIS — Z1329 Encounter for screening for other suspected endocrine disorder: Secondary | ICD-10-CM

## 2021-11-21 DIAGNOSIS — Z79899 Other long term (current) drug therapy: Secondary | ICD-10-CM

## 2021-11-21 DIAGNOSIS — E782 Mixed hyperlipidemia: Secondary | ICD-10-CM | POA: Diagnosis not present

## 2021-11-21 DIAGNOSIS — I10 Essential (primary) hypertension: Secondary | ICD-10-CM

## 2021-11-21 DIAGNOSIS — N3010 Interstitial cystitis (chronic) without hematuria: Secondary | ICD-10-CM

## 2021-11-21 DIAGNOSIS — M8580 Other specified disorders of bone density and structure, unspecified site: Secondary | ICD-10-CM

## 2021-11-21 NOTE — Patient Instructions (Signed)
PT referral has been placed at [facility]  Acute Knee Pain, Adult Many things can cause knee pain. Sometimes, knee pain is sudden (acute) and may be caused by damage, swelling, or irritation of the muscles and tissues that support your knee. The pain often goes away on its own with time and rest. If the pain does not go away, tests may be done to find out what is causing the pain. Follow these instructions at home: If you have a knee sleeve or brace:  Wear the knee sleeve or brace as told by your doctor. Take it off only as told by your doctor. Loosen it if your toes: Tingle. Become numb. Turn cold and blue. Keep it clean. If the knee sleeve or brace is not waterproof: Do not let it get wet. Cover it with a watertight covering when you take a bath or shower. Activity Rest your knee. Do not do things that cause pain or make pain worse. Avoid activities where both feet leave the ground at the same time (high-impact activities). Examples are running, jumping rope, and doing jumping jacks. Work with a physical therapist to make a safe exercise program, as told by your doctor. Managing pain, stiffness, and swelling  If told, put ice on the knee. To do this: If you have a removable knee sleeve or brace, take it off as told by your doctor. Put ice in a plastic bag. Place a towel between your skin and the bag. Leave the ice on for 20 minutes, 2-3 times a day. Take off the ice if your skin turns bright red. This is very important. If you cannot feel pain, heat, or cold, you have a greater risk of damage to the area. If told, use an elastic bandage to put pressure (compression) on your injured knee. Raise your knee above the level of your heart while you are sitting or lying down. Sleep with a pillow under your knee. General instructions Take over-the-counter and prescription medicines only as told by your doctor. Do not smoke or use any products that contain nicotine or tobacco. If you need  help quitting, ask your doctor. If you are overweight, work with your doctor and a food expert (dietitian) to set goals to lose weight. Being overweight can make your knee hurt more. Watch for any changes in your symptoms. Keep all follow-up visits. Contact a doctor if: The knee pain does not stop. The knee pain changes or gets worse. You have a fever along with knee pain. Your knee is red or feels warm when you touch it. Your knee gives out or locks up. Get help right away if: Your knee swells, and the swelling gets worse. You cannot move your knee. You have very bad knee pain that does not get better with pain medicine. Summary Many things can cause knee pain. The pain often goes away on its own with time and rest. Your doctor may do tests to find out the cause of the pain. Watch for any changes in your symptoms. Relieve your pain with rest, medicines, light activity, and use of ice. Get help right away if you cannot move your knee or your knee pain is very bad. This information is not intended to replace advice given to you by your health care provider. Make sure you discuss any questions you have with your health care provider. Document Revised: 07/20/2019 Document Reviewed: 07/20/2019 Elsevier Patient Education  [place].

## 2021-11-22 LAB — CBC WITH DIFFERENTIAL/PLATELET
Absolute Monocytes: 512 cells/uL (ref 200–950)
Basophils Absolute: 49 cells/uL (ref 0–200)
Basophils Relative: 0.8 %
Eosinophils Absolute: 73 cells/uL (ref 15–500)
Eosinophils Relative: 1.2 %
HCT: 40.4 % (ref 35.0–45.0)
Hemoglobin: 13.8 g/dL (ref 11.7–15.5)
Lymphs Abs: 1501 cells/uL (ref 850–3900)
MCH: 32.8 pg (ref 27.0–33.0)
MCHC: 34.2 g/dL (ref 32.0–36.0)
MCV: 96 fL (ref 80.0–100.0)
MPV: 11.9 fL (ref 7.5–12.5)
Monocytes Relative: 8.4 %
Neutro Abs: 3965 cells/uL (ref 1500–7800)
Neutrophils Relative %: 65 %
Platelets: 236 10*3/uL (ref 140–400)
RBC: 4.21 10*6/uL (ref 3.80–5.10)
RDW: 11.8 % (ref 11.0–15.0)
Total Lymphocyte: 24.6 %
WBC: 6.1 10*3/uL (ref 3.8–10.8)

## 2021-11-22 LAB — URINALYSIS, ROUTINE W REFLEX MICROSCOPIC
Bilirubin Urine: NEGATIVE
Glucose, UA: NEGATIVE
Hgb urine dipstick: NEGATIVE
Hyaline Cast: NONE SEEN /LPF
Ketones, ur: NEGATIVE
Nitrite: NEGATIVE
Protein, ur: NEGATIVE
Specific Gravity, Urine: 1.018 (ref 1.001–1.035)
WBC, UA: NONE SEEN /HPF (ref 0–5)
pH: 6.5 (ref 5.0–8.0)

## 2021-11-22 LAB — MICROALBUMIN / CREATININE URINE RATIO
Creatinine, Urine: 108 mg/dL (ref 20–275)
Microalb Creat Ratio: 2 mcg/mg creat (ref <30)
Microalb, Ur: 0.2 mg/dL

## 2021-11-22 LAB — COMPLETE METABOLIC PANEL WITH GFR
AG Ratio: 2 (calc) (ref 1.0–2.5)
ALT: 15 U/L (ref 6–29)
AST: 18 U/L (ref 10–35)
Albumin: 4.5 g/dL (ref 3.6–5.1)
Alkaline phosphatase (APISO): 60 U/L (ref 37–153)
BUN: 12 mg/dL (ref 7–25)
CO2: 27 mmol/L (ref 20–32)
Calcium: 9.2 mg/dL (ref 8.6–10.4)
Chloride: 103 mmol/L (ref 98–110)
Creat: 0.69 mg/dL (ref 0.60–1.00)
Globulin: 2.2 g/dL (calc) (ref 1.9–3.7)
Glucose, Bld: 80 mg/dL (ref 65–99)
Potassium: 4.2 mmol/L (ref 3.5–5.3)
Sodium: 138 mmol/L (ref 135–146)
Total Bilirubin: 0.4 mg/dL (ref 0.2–1.2)
Total Protein: 6.7 g/dL (ref 6.1–8.1)
eGFR: 93 mL/min/{1.73_m2} (ref >=60)

## 2021-11-22 LAB — LIPID PANEL
Cholesterol: 223 mg/dL — ABNORMAL HIGH (ref <200)
HDL: 87 mg/dL (ref >=50)
LDL Cholesterol (Calc): 116 mg/dL (calc) — ABNORMAL HIGH
Non-HDL Cholesterol (Calc): 136 mg/dL (calc) — ABNORMAL HIGH (ref <130)
Total CHOL/HDL Ratio: 2.6 (calc) (ref <5.0)
Triglycerides: 97 mg/dL (ref <150)

## 2021-11-22 LAB — MAGNESIUM: Magnesium: 2.1 mg/dL (ref 1.5–2.5)

## 2021-11-22 LAB — HEMOGLOBIN A1C
Hgb A1c MFr Bld: 5.4 % of total Hgb (ref <5.7)
Mean Plasma Glucose: 108 mg/dL
eAG (mmol/L): 6 mmol/L

## 2021-11-22 LAB — MICROSCOPIC MESSAGE

## 2021-11-22 LAB — TSH: TSH: 1.34 mIU/L (ref 0.40–4.50)

## 2021-11-22 LAB — VITAMIN D 25 HYDROXY (VIT D DEFICIENCY, FRACTURES): Vit D, 25-Hydroxy: 66 ng/mL (ref 30–100)

## 2021-11-25 ENCOUNTER — Ambulatory Visit (HOSPITAL_BASED_OUTPATIENT_CLINIC_OR_DEPARTMENT_OTHER): Payer: PPO

## 2021-11-28 ENCOUNTER — Encounter (HOSPITAL_BASED_OUTPATIENT_CLINIC_OR_DEPARTMENT_OTHER): Payer: Self-pay | Admitting: Obstetrics & Gynecology

## 2021-12-01 ENCOUNTER — Ambulatory Visit (HOSPITAL_COMMUNITY)
Admission: EM | Admit: 2021-12-01 | Discharge: 2021-12-01 | Disposition: A | Discharge status: Home / Self Care | Payer: PPO

## 2021-12-01 ENCOUNTER — Encounter (HOSPITAL_COMMUNITY): Payer: Self-pay

## 2021-12-01 DIAGNOSIS — R2242 Localized swelling, mass and lump, left lower limb: Secondary | ICD-10-CM | POA: Diagnosis not present

## 2021-12-01 DIAGNOSIS — M79675 Pain in left toe(s): Secondary | ICD-10-CM

## 2021-12-01 NOTE — ED Triage Notes (Signed)
Pain in the left big toe. Had a pedicure Thursday. Onset of pain that day. Was worried about possible cut or ingrown nail.  States the toe is now red and sore on the bottom, swelling has increased as well.   Has history of ingrown toenails but states this feels different. No diabetes.

## 2021-12-01 NOTE — Discharge Instructions (Signed)
Advised to use Epsom salt soaks, 10 minutes in 20 minutes out, 3-4 times throughout the day to help reduce pain and swelling. Advised to take Aleve, 2 tablets every 12 hours with food over the next couple days to help reduce pain and swelling. Advised to wear good cushioned shoes. Advised to return if symptoms fail to improve over the next several days or if redness and streaking starts occurring.

## 2021-12-01 NOTE — ED Provider Notes (Signed)
[facility]    CSN: [id number] Arrival date & time: 12/01/21  1000      History   Chief Complaint Chief Complaint  Patient presents with   Toe Pain    HPI Helen Glover is a 71 y.o. female.   71 year old female presents with left toe pain.  Patient indicates 4 days ago she had a pedicure at an office to where she has been getting pedicures for the past 20 years with the same individual.  She indicates that she did not have any problems after the pedicure however later on in the evening she started getting left toe pain and discomfort, and then on Friday she started getting some redness at the outside of the toe.  She indicates she has had some pain walking.  She indicates she did take some Aleve and some Mobic which has helped reduce the pain and swelling.  She indicates she has not injured the toe, stubbed the toe, or traumatized the toe that she can recall.  She indicates she is never had any episodes of gout.  And she relates she has not have any fever or chills.   Toe Pain    Past Medical History:  Diagnosis Date   Anxiety    Arthritis    in neck   Bursitis of right hip    Carpal tunnel syndrome, bilateral    rt wrist   Complete rotator cuff tear of left shoulder    Eagle's syndrome    GERD (gastroesophageal reflux disease)    Hearing loss of both ears 12/25/2014   Has hearing aids/tubes    Heart murmur    as child   HNP (herniated nucleus pulposus)    Ingrown toenail of right foot 2023   Interstitial cystitis    Kidney cysts    Osteopenia    Plantar fasciitis    Spinal stenosis    Tingling    rt foot   Wears hearing aid     Patient Active Problem List   Diagnosis Date Noted   Hemorrhoids 04/10/2021   Osteopenia 02/25/2021   Lumbar back pain with radiculopathy affecting right lower extremity 07/10/2020   Aortic atherosclerosis ([place]) 07/07/2019   Hyperlipidemia    Hypertension    History of prediabetes    Vitamin D deficiency    GERD  (gastroesophageal reflux disease) 11/17/2011   Interstitial cystitis     Past Surgical History:  Procedure Laterality Date   ABDOMINAL HYSTERECTOMY  2003   BSO   ANAL SPHINCTEROTOMY     CARPAL TUNNEL RELEASE     LEFT   CERVICAL FUSION  02/25/2018   c1-c2 with laminectomy   CESAREAN SECTION     CYSTOSCOPY     HEMORRHOID SURGERY N/A 1998   KNEE ARTHROSCOPY Right 06/22/2019   Dr. Veverly Fells    LUMBAR LAMINECTOMY     LUMBAR LAMINECTOMY/DECOMPRESSION MICRODISCECTOMY N/A 08/09/2014   Procedure: MICRO LUMBAR DECOMPRESSION L3-4,  REDO DECOMPRESSION, MICRODISCECTOMY L4-5, FORAMINOTOMY L3-L4, L4-L5 BILATERAL;  Surgeon: Susa Day, MD;  Location: [facility];  Service: Orthopedics;  Laterality: N/A;   MYRINGOTOMY     NASAL SINUS SURGERY     TONSILLECTOMY     LEFT only, due to Eagle Syndrome   TONSILLECTOMY  10/14   right side due to Eagles syndrome   TRIGGER FINGER RELEASE     RIGHT   TYMPANOSTOMY TUBE PLACEMENT      OB History     Gravida  2   Para  2  Term  1   Preterm  1   AB  0   Living  2      SAB  0   IAB  0   Ectopic  0   Multiple  0   Live Births               Home Medications    Prior to Admission medications   Medication Sig Start Date End Date Taking? Authorizing Provider  Ascorbic Acid (VITAMIN C) 100 MG tablet Take 1,000 mg by mouth daily.   Yes [provider]  Biotin 1 MG CAPS Take 1 capsule by mouth daily.    Yes [provider]  calcium carbonate (OS-CAL - DOSED IN MG OF ELEMENTAL CALCIUM) 1250 (500 Ca) MG tablet Take 1 tablet by mouth daily with breakfast.   Yes [provider]  cholecalciferol (VITAMIN D) 1000 UNITS tablet Take 3,000 Units by mouth daily.    Yes [provider]  estradiol (VIVELLE-DOT) 0.025 MG/24HR Place 1 patch onto the skin 2 (two) times a week. 10/07/21  Yes Megan Salon, MD  fluconazole (DIFLUCAN) 150 MG tablet Take 1 tablet by mouth once.   Yes [provider]  GABAPENTIN  PO Take 100 mg by mouth at bedtime.   Yes [provider]  L-Arginine 1000 MG TABS Take 1 tablet by mouth daily.   Yes [provider]  metroNIDAZOLE (METROGEL) 0.75 % vaginal gel Place 1 Applicatorful vaginally at bedtime. Then use place one applicator vaginally twice weekly for 3 months. 11/15/21  Yes Megan Salon, MD  omeprazole (PRILOSEC) 20 MG capsule Take  1 capsule  Daily  to Prevent Heartburn & Indigestion  (Dx:  k21.9  ) 10/23/21  Yes Unk Pinto, MD  Probiotic Product (PROBIOTIC PO) Take 1 tablet by mouth daily.   Yes [provider]  pyridOXINE (VITAMIN B-6) 100 MG tablet Take 100 mg by mouth daily.   Yes [provider]  vitamin B-12 (CYANOCOBALAMIN) 100 MCG tablet Take 1,000 mcg by mouth daily.   Yes [provider]  doxycycline (VIBRAMYCIN) 100 MG capsule TAKE 1 CAPSULE 2 TIMES A DAY WITH FOOD FOR INFECTION    [provider]  meloxicam (MOBIC) 15 MG tablet Take 1 tablet by mouth daily. 11/06/21   [provider]    Family History Family History  Problem Relation Age of Onset   Diabetes Mother    Hypertension Mother    Hyperlipidemia Mother    Osteoporosis Mother    Hypertension Brother    Hyperlipidemia Brother    Prostate cancer Father        Bone    Social History Social History   Tobacco Use   Smoking status: Former    Packs/day: 0.50    Years: 15.00    Total pack years: 7.50    Types: Cigarettes    Quit date: 12/04/1986    Years since quitting: 35.0   Smokeless tobacco: Never  Vaping Use   Vaping Use: Never used  Substance Use Topics   Alcohol use: Yes    Alcohol/week: 6.0 - 7.0 standard drinks of alcohol    Types: 6 - 7 Standard drinks or equivalent per week   Drug use: No     Allergies   Amoxicillin-pot clavulanate, Levofloxacin, Other, Codeine, and Sulfa antibiotics   Review of Systems Review of Systems  Skin:  Positive for rash (left big toe redness and soreness).      Physical Exam  Triage Vital Signs ED Triage Vitals  Enc Vitals Group     BP 12/01/21 1018 (!) 154/96     Pulse Rate 12/01/21 1018 70     Resp 12/01/21 1018 16     Temp 12/01/21 1018 98.6 F (37 C)     Temp Source 12/01/21 1018 Oral     SpO2 12/01/21 1018 98 %     Weight --      Height --      Head Circumference --      Peak Flow --      Pain Score 12/01/21 1017 4     Pain Loc --      Pain Edu? --      Excl. in [place]? --    No data found.  Updated Vital Signs BP (!) 154/96 (BP Location: Left Wrist)   Pulse 70   Temp 98.6 F (37 C) (Oral)   Resp 16   [name] 10/18/2001   SpO2 98%   Visual Acuity Right Eye Distance:   Left Eye Distance:   Bilateral Distance:    Right Eye Near:   Left Eye Near:    Bilateral Near:     Physical Exam Constitutional:      Appearance: Normal appearance.  Musculoskeletal:       Feet:  Feet:     Comments: Left big toe: There is no pain, redness, or swelling a long with the nail margins, and no pain or tenderness on compression of the nail margins or nail border.  There is some mild redness at the proximal lateral PIP joint area with mild swelling and tenderness on palpation, no streaking, no evidence of break of the skin, no evidence of infection.  Full range of motion is normal. Neurological:     Mental Status: She is alert.      UC Treatments / Results  Labs (all labs ordered are listed, but only abnormal results are displayed) Labs Reviewed - No data to display  EKG   Radiology No results found.  Procedures Procedures (including critical care time)  Medications Ordered in UC Medications - No data to display  Initial Impression / Assessment and Plan / UC Course  I have reviewed the triage vital signs and the nursing notes.  Pertinent labs & imaging results that were available during my care of the patient were reviewed by me and considered in my medical decision making (see chart for details).    Plan: 1.  The  great toe pain will be treated with the following: A.  Advised the patient to take Aleve, 2 tablets every 12 hours with food to help reduce pain and discomfort. 2.  Swelling of the left great toe will be treated with the following: A.  Advised the patient to use warm Epsom salt soaks, 10 minutes and then 20 minutes out, 4-5 times throughout the day over the next several days to help reduce swelling and discomfort. 3.  Patient advised to follow-up with PCP or return to urgent care if symptoms fail to improve. Final Clinical Impressions(s) / UC Diagnoses   Final diagnoses:  Great toe pain, left  Localized swelling of toe of left foot     Discharge Instructions      Advised to use Epsom salt soaks, 10 minutes in 20 minutes out, 3-4 times throughout the day to help reduce pain and swelling. Advised to take Aleve, 2 tablets every 12 hours with food over the next couple days to help reduce pain  and swelling. Advised to wear good cushioned shoes. Advised to return if symptoms fail to improve over the next several days or if redness and streaking starts occurring.     ED Prescriptions   None    PDMP not reviewed this encounter.   Nyoka Lint, PA-C 12/01/21 1040

## 2021-12-02 ENCOUNTER — Other Ambulatory Visit (HOSPITAL_COMMUNITY)
Admission: RE | Admit: 2021-12-02 | Discharge: 2021-12-02 | Disposition: A | Discharge status: Home / Self Care | Payer: PPO | Source: Ambulatory Visit | Attending: Obstetrics & Gynecology | Admitting: Obstetrics & Gynecology

## 2021-12-02 ENCOUNTER — Ambulatory Visit (INDEPENDENT_AMBULATORY_CARE_PROVIDER_SITE_OTHER): Payer: PPO

## 2021-12-02 DIAGNOSIS — R300 Dysuria: Secondary | ICD-10-CM | POA: Diagnosis not present

## 2021-12-02 DIAGNOSIS — N898 Other specified noninflammatory disorders of vagina: Secondary | ICD-10-CM

## 2021-12-02 LAB — POCT URINALYSIS DIPSTICK
Bilirubin, UA: NEGATIVE
Blood, UA: NEGATIVE
Glucose, UA: NEGATIVE
Ketones, UA: NEGATIVE
Leukocytes, UA: NEGATIVE
Nitrite, UA: NEGATIVE
Protein, UA: NEGATIVE
Spec Grav, UA: 1.015 (ref 1.010–1.025)
Urobilinogen, UA: 0.2 E.U./dL
pH, UA: 6.5 (ref 5.0–8.0)

## 2021-12-02 NOTE — Progress Notes (Signed)
Patient came in today to do an aptima, urine sample and urine culture. Patient had positive leukocytes with last urine sample. Patient's complaints include, urinary urgency, burning and itching. Samples sent off for evaluation. tbw

## 2021-12-03 ENCOUNTER — Encounter: Payer: Self-pay | Admitting: Nurse Practitioner

## 2021-12-03 LAB — CERVICOVAGINAL ANCILLARY ONLY
Bacterial Vaginitis (gardnerella): POSITIVE — AB
Candida Glabrata: NEGATIVE
Candida Vaginitis: NEGATIVE
Comment: NEGATIVE
Comment: NEGATIVE
Comment: NEGATIVE

## 2021-12-03 NOTE — Telephone Encounter (Signed)
Can you put this in her immunizations

## 2021-12-03 NOTE — Telephone Encounter (Signed)
RSV

## 2021-12-04 ENCOUNTER — Encounter (HOSPITAL_BASED_OUTPATIENT_CLINIC_OR_DEPARTMENT_OTHER): Payer: Self-pay | Admitting: Obstetrics & Gynecology

## 2021-12-04 LAB — URINE CULTURE

## 2021-12-09 ENCOUNTER — Encounter: Payer: Self-pay | Admitting: Nurse Practitioner

## 2021-12-09 ENCOUNTER — Other Ambulatory Visit (HOSPITAL_BASED_OUTPATIENT_CLINIC_OR_DEPARTMENT_OTHER): Payer: Self-pay | Admitting: Obstetrics & Gynecology

## 2021-12-09 ENCOUNTER — Other Ambulatory Visit: Payer: Self-pay | Admitting: Nurse Practitioner

## 2021-12-09 DIAGNOSIS — J310 Chronic rhinitis: Secondary | ICD-10-CM | POA: Diagnosis not present

## 2021-12-09 DIAGNOSIS — K649 Unspecified hemorrhoids: Secondary | ICD-10-CM

## 2021-12-09 DIAGNOSIS — H7202 Central perforation of tympanic membrane, left ear: Secondary | ICD-10-CM | POA: Diagnosis not present

## 2021-12-09 DIAGNOSIS — H903 Sensorineural hearing loss, bilateral: Secondary | ICD-10-CM | POA: Diagnosis not present

## 2021-12-09 DIAGNOSIS — J343 Hypertrophy of nasal turbinates: Secondary | ICD-10-CM | POA: Diagnosis not present

## 2021-12-09 MED ORDER — HYDROCORTISONE (PERIANAL) 2.5 % EX CREA: 1.0000 | TOPICAL_CREAM | Freq: Two times a day (BID) | CUTANEOUS | 0 refills | Status: AC

## 2021-12-09 MED ORDER — CLINDAMYCIN PHOSPHATE 2 % VA CREA: 1.0000 | TOPICAL_CREAM | Freq: Every day | VAGINAL | 0 refills | Status: DC

## 2021-12-17 ENCOUNTER — Encounter (HOSPITAL_BASED_OUTPATIENT_CLINIC_OR_DEPARTMENT_OTHER): Payer: Self-pay | Admitting: Obstetrics & Gynecology

## 2021-12-17 ENCOUNTER — Other Ambulatory Visit: Payer: Self-pay

## 2021-12-17 ENCOUNTER — Ambulatory Visit (HOSPITAL_BASED_OUTPATIENT_CLINIC_OR_DEPARTMENT_OTHER): Payer: PPO | Attending: Nurse Practitioner | Admitting: Physical Therapy

## 2021-12-17 ENCOUNTER — Encounter (HOSPITAL_BASED_OUTPATIENT_CLINIC_OR_DEPARTMENT_OTHER): Payer: Self-pay | Admitting: Physical Therapy

## 2021-12-17 DIAGNOSIS — M25561 Pain in right knee: Secondary | ICD-10-CM | POA: Insufficient documentation

## 2021-12-17 DIAGNOSIS — M6281 Muscle weakness (generalized): Secondary | ICD-10-CM | POA: Diagnosis not present

## 2021-12-17 DIAGNOSIS — M170 Bilateral primary osteoarthritis of knee: Secondary | ICD-10-CM | POA: Insufficient documentation

## 2021-12-17 DIAGNOSIS — M25562 Pain in left knee: Secondary | ICD-10-CM | POA: Insufficient documentation

## 2021-12-17 NOTE — Therapy (Signed)
OUTPATIENT PHYSICAL THERAPY LOWER EXTREMITY EVALUATION   Patient Name: Helen Glover MRN: [id number] DOB:[birth year], 71 y.o., female Today's Date: 12/18/2021   PT End of Session - 12/17/21 1157     Visit Number 1    Number of Visits 5    Date for PT Re-Evaluation 01/14/22    Authorization Type HTA    PT Start Time 1102    PT Stop Time 1151    PT Time Calculation (min) 49 min    Activity Tolerance Patient tolerated treatment well    Behavior During Therapy WFL for tasks assessed/performed             Past Medical History:  Diagnosis Date   Anxiety    Arthritis    in neck   Bursitis of right hip    Carpal tunnel syndrome, bilateral    rt wrist   Complete rotator cuff tear of left shoulder    Eagle's syndrome    GERD (gastroesophageal reflux disease)    Hearing loss of both ears 12/25/2014   Has hearing aids/tubes    Heart murmur    as child   HNP (herniated nucleus pulposus)    Ingrown toenail of right foot 2023   Interstitial cystitis    Kidney cysts    Osteopenia    Plantar fasciitis    Spinal stenosis    Tingling    rt foot   Wears hearing aid    Past Surgical History:  Procedure Laterality Date   ABDOMINAL HYSTERECTOMY  2003   BSO   ANAL SPHINCTEROTOMY     CARPAL TUNNEL RELEASE     LEFT   CERVICAL FUSION  02/25/2018   c1-c2 with laminectomy   CESAREAN SECTION     CYSTOSCOPY     HEMORRHOID SURGERY N/A 1998   KNEE ARTHROSCOPY Right 06/22/2019   Dr. Veverly Fells    LUMBAR LAMINECTOMY     LUMBAR LAMINECTOMY/DECOMPRESSION MICRODISCECTOMY N/A 08/09/2014   Procedure: MICRO LUMBAR DECOMPRESSION L3-4,  REDO DECOMPRESSION, MICRODISCECTOMY L4-5, FORAMINOTOMY L3-L4, L4-L5 BILATERAL;  Surgeon: Susa Day, MD;  Location: [facility];  Service: Orthopedics;  Laterality: N/A;   MYRINGOTOMY     NASAL SINUS SURGERY     TONSILLECTOMY     LEFT only, due to [facility] Syndrome   TONSILLECTOMY  10/14   right side due to [facility] syndrome   TRIGGER FINGER RELEASE     RIGHT    TYMPANOSTOMY TUBE PLACEMENT     Patient Active Problem List   Diagnosis Date Noted   Hemorrhoids 04/10/2021   Osteopenia 02/25/2021   Lumbar back pain with radiculopathy affecting right lower extremity 07/10/2020   Aortic atherosclerosis ([place]) 07/07/2019   Hyperlipidemia    Hypertension    History of prediabetes    Vitamin D deficiency    GERD (gastroesophageal reflux disease) 11/17/2011   Interstitial cystitis     REFERRING PROVIDER:  Esmond Plants, MD  REFERRING DIAG: M17.0 (ICD-10-CM) - bilat primary OA of knees  THERAPY DIAG:  Right knee pain, unspecified chronicity  Left knee pain, unspecified chronicity  Muscle weakness (generalized)  Rationale for Evaluation and Treatment: Rehabilitation  ONSET DATE: August 2023  SUBJECTIVE:   SUBJECTIVE STATEMENT: Pt began having pain in August 2023 with no specific MOI.  She has a hx of lumbar pain/sciatica and first thought it may be sciatica though knee began hurting.  Pt saw PA in September for significant R knee pain including posterior knee.  Pt had x rays and had an injection which  improved her pain.  Pt followed up with Dr. Veverly Fells on 10/3 and he ordered PT.  MD notes indicated bilateral knee pain likely due to arthritis exacerbation.  PT script indicated - Evaluate and treat _bilateral knee__ 1-2xwk x 2 weeks bilat knee strengthening and quadriceps strengthening. HEP, history of sciatic pain.  Pt also saw her PCP for annual wellness visit on 11/21/21 and she also ordered PT.    Pt has increased pain with stairs, descending > ascending.  Pt has increased pain with prolonged ambulation on a hard surface.  Pt hasn't rode her bike a lot.  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   Pt is a member at [employer].   PERTINENT HISTORY: -Chronic bilat knee pain, Spinal stenosis and LBP with R sided radiculopathy, Osteopenia, R hip bursitis, Hearing loss bilat (hearing aids)    -PSHx: R knee arthroscopy on 06/22/2019 3 lumbar surgeries including lumbar laminectomy/decompression/microdiscectomy with the last surgery in 2021 C1-2 posterior laminectomy and fusion on 02/25/18,      PAIN:  Are you having pain? Yes R knee:  Current:  3/10, Best: 2/10, Worst:  9/10 L knee:   Current:  1/10, Best: 0/10, Worst: 4-5/10 Alleviating Factors: lying down, injection Aggravating Factors:  stairs, squatting, kneeling   PRECAUTIONS: Other: chronic knee pain, chronic LBP and 3 lumbar surgeries, and C1-2 fusion  WEIGHT BEARING RESTRICTIONS: No  FALLS:  Has patient fallen in last 6 months? No   OCCUPATION: Pt is retired.  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   PLOF: Independent; Pt had bilat knee pain though had less pain with her functional mobility and volunteer activities.   PATIENT GOALS: to have reduced pain in knees, improve quad strength   OBJECTIVE:   DIAGNOSTIC FINDINGS:  X-rays from MD notes:  both knees demonstrate very mild tricompartmental degenerative changes; lateral of the left knee shows mild medial joint space loss of the right knee and some early patellofemoral OA on the left knee. Overall alignment of the knees is fairly well-preserved.      PATIENT SURVEYS:  FOTO 61 with a goal of 68 at visit #13  COGNITION: Overall cognitive status: Within functional limits for tasks assessed       LOWER EXTREMITY ROM:  Active ROM Right eval Left eval  Hip flexion    Hip extension    Hip abduction    Hip adduction    Hip internal rotation    Hip external rotation    Knee flexion 118/125 130  Knee extension 1/0 0  Ankle dorsiflexion    Ankle plantarflexion    Ankle inversion    Ankle eversion     (Blank rows = not tested)  LOWER EXTREMITY MMT:  MMT Right eval Left eval  Hip flexion 4+/5; 22.1 5/5; 24.1  Hip extension    Hip abduction 5/5 5/5  Hip adduction    Hip internal rotation     Hip external rotation 5/5 5/5  Knee flexion 5/5 in sitting 5/5 in sitting; Attempted in prone but HS cramping  Knee extension 4+/5; 36.0 4/5; 31.0  Ankle dorsiflexion    Ankle plantarflexion    Ankle inversion    Ankle eversion     (Blank rows = not tested)    GAIT: Assistive device utilized: None Level of assistance: Complete Independence Comments: Pt ambulated with a normalized heel to toe gait without limping.  TODAY'S TREATMENT:                                                                                                                              DATE: 12/17/2021   Reviewed current HEP and instructed pt in correct form and appropriate frequency.  See below for pt education.   PATIENT EDUCATION:  Education details: dx, exercise rationale, exercise form, relevant anatomy, POC, and objective findings.  PT answered Pt's questions. Person educated: Patient Education method: Explanation, Demonstration, Tactile cues, and Verbal cues Education comprehension: verbalized understanding, returned demonstration, verbal cues required, tactile cues required, and needs further education  HOME EXERCISE PROGRAM: Pt has a current HEP. Will progress next visit.   ASSESSMENT:  CLINICAL IMPRESSION: Patient is a 71 y.o. female with a dx of bilat knee OA presenting to the clinic R > L knee pain and muscle weakness in bilat LE's.  Pt has chronic bilat knee pain though was doing well until August.  She began having R knee pain and thought it may be her sciatica flaring up.  She then began having L knee pain also.  Pt had an injection in R knee which improved her sx's.  Pt has increased pain with stairs, (descending > ascending), prolonged ambulation, performing volunteer work.  Pt performs home exercises consistently currently.  Pt should benefit from skilled PT services to address impairments and to improve overall function.       OBJECTIVE IMPAIRMENTS: decreased activity tolerance,  decreased mobility, difficulty walking, decreased ROM, decreased strength, hypomobility, impaired flexibility, and pain.   ACTIVITY LIMITATIONS: stairs and locomotion level  PARTICIPATION LIMITATIONS:  volunteer work  PERSONAL FACTORS: 3+ comorbidities: Chronic bilat knee pain, Spinal stenosis and LBP with R sided radiculopathy, R hip bursitis, R knee surgery, C1-2 fusion, and 3 lumbar surgeries  are also affecting patient's functional outcome.   REHAB POTENTIAL: Good  CLINICAL DECISION MAKING: Stable/uncomplicated  EVALUATION COMPLEXITY: Low   GOALS:   SHORT TERM GOALS:  Pt will report at least a 25% improvement in pain and sx's overall for improved mobility.  Baseline: Goal status: INITIAL Target date:  12/31/2021   2.  Pt will be independent and compliant with HEP for improved pain, strength, ROM, and function.  Baseline:  Goal status: INITIAL Target date:  01/07/2022    LONG TERM GOALS: Target date: 01/14/2022  Pt will demo improved bilat hip flexion and knee extension strength by at least 4-5# and MMT to 5/5 in hip flexion and bilat knee extension for improved performance of and tolerance with functional mobility and volunteer work.  Baseline:  Goal status: INITIAL  2.  Pt will report at least a 60% improvement pain with volunteer work.  Baseline:  Goal status: INITIAL  3.  Pt will demo good control with stairs and perform stairs without significant pain. Baseline:  Goal status: INITIAL    PLAN:  PT FREQUENCY: 1-2x/week  PT DURATION: 4 weeks  PLANNED INTERVENTIONS: Therapeutic  exercises, Therapeutic activity, Neuromuscular re-education, Balance training, Gait training, Patient/Family education, Self Care, Stair training, Aquatic Therapy, Dry Needling, Electrical stimulation, Cryotherapy, Moist heat, Taping, Manual therapy, and Re-evaluation  PLAN FOR NEXT SESSION: cont with LE strengthening bilat.  Update HEP.   Selinda Michaels III PT, DPT 12/18/21 6:51  PM

## 2021-12-18 ENCOUNTER — Encounter: Payer: Self-pay | Admitting: Nurse Practitioner

## 2021-12-19 ENCOUNTER — Encounter (HOSPITAL_BASED_OUTPATIENT_CLINIC_OR_DEPARTMENT_OTHER): Payer: Self-pay | Admitting: Obstetrics & Gynecology

## 2021-12-19 ENCOUNTER — Other Ambulatory Visit (HOSPITAL_COMMUNITY)
Admission: RE | Admit: 2021-12-19 | Discharge: 2021-12-19 | Disposition: A | Discharge status: Home / Self Care | Payer: PPO | Source: Ambulatory Visit | Attending: Obstetrics & Gynecology | Admitting: Obstetrics & Gynecology

## 2021-12-19 ENCOUNTER — Ambulatory Visit (HOSPITAL_BASED_OUTPATIENT_CLINIC_OR_DEPARTMENT_OTHER): Payer: PPO | Admitting: Obstetrics & Gynecology

## 2021-12-19 VITALS — BP 130/59 | HR 69 | Ht 58.5 in | Wt 112.0 lb

## 2021-12-19 DIAGNOSIS — L821 Other seborrheic keratosis: Secondary | ICD-10-CM | POA: Diagnosis not present

## 2021-12-19 DIAGNOSIS — L814 Other melanin hyperpigmentation: Secondary | ICD-10-CM | POA: Diagnosis not present

## 2021-12-19 DIAGNOSIS — N898 Other specified noninflammatory disorders of vagina: Secondary | ICD-10-CM | POA: Insufficient documentation

## 2021-12-19 DIAGNOSIS — D2372 Other benign neoplasm of skin of left lower limb, including hip: Secondary | ICD-10-CM | POA: Diagnosis not present

## 2021-12-19 DIAGNOSIS — L570 Actinic keratosis: Secondary | ICD-10-CM | POA: Diagnosis not present

## 2021-12-19 DIAGNOSIS — D1801 Hemangioma of skin and subcutaneous tissue: Secondary | ICD-10-CM | POA: Diagnosis not present

## 2021-12-19 DIAGNOSIS — D692 Other nonthrombocytopenic purpura: Secondary | ICD-10-CM | POA: Diagnosis not present

## 2021-12-19 NOTE — Progress Notes (Signed)
GYNECOLOGY  VISIT  CC:   vulvar irritation  HPI: 71 y.o. G19P1102 Married White or Caucasian female here for recheck for BV.  Pt current having recurrent or resistant BV.  She was recently treated with clindamycin and does feel better with this.  Has not been successful using metrogel due to feeling like she had irritation with this as well.  She has used boric acid suppositories but doesn't feel these dissolved.  Denies urinary symptoms.  Does have urology follow up tomorrow but had finally had a negative urine culture and she feels her UTI has finally resolved.    Past Medical History:  Diagnosis Date   Anxiety    Arthritis    in neck   Bursitis of right hip    Carpal tunnel syndrome, bilateral    rt wrist   Complete rotator cuff tear of left shoulder    Eagle's syndrome    GERD (gastroesophageal reflux disease)    Hearing loss of both ears 12/25/2014   Has hearing aids/tubes    Heart murmur    as child   HNP (herniated nucleus pulposus)    Ingrown toenail of right foot 2023   Interstitial cystitis    Kidney cysts    Osteopenia    Plantar fasciitis    Spinal stenosis    Tingling    rt foot   Wears hearing aid     MEDS:   Current Outpatient Medications on File Prior to Visit  Medication Sig Dispense Refill   Ascorbic Acid (VITAMIN C) 100 MG tablet Take 1,000 mg by mouth daily.     Biotin 1 MG CAPS Take 1 capsule by mouth daily.      calcium carbonate (OS-CAL - DOSED IN MG OF ELEMENTAL CALCIUM) 1250 (500 Ca) MG tablet Take 1 tablet by mouth daily with breakfast.     cholecalciferol (VITAMIN D) 1000 UNITS tablet Take 3,000 Units by mouth daily.      clindamycin (CLEOCIN) 2 % vaginal cream Place 1 Applicatorful vaginally at bedtime. Use for 7 nights. (Patient not taking: Reported on 12/17/2021) 40 g 0   estradiol (VIVELLE-DOT) 0.025 MG/24HR Place 1 patch onto the skin 2 (two) times a week. 24 patch 4   GABAPENTIN PO Take 100 mg by mouth at bedtime.     hydrocortisone  (ANUSOL-HC) 2.5 % rectal cream Place 1 Application rectally 2 (two) times daily. 30 g 0   L-Arginine 1000 MG TABS Take 1 tablet by mouth daily.     omeprazole (PRILOSEC) 20 MG capsule Take  1 capsule  Daily  to Prevent Heartburn & Indigestion  (Dx:  k21.9  ) 90 capsule 3   Probiotic Product (PROBIOTIC PO) Take 1 tablet by mouth daily.     pyridOXINE (VITAMIN B-6) 100 MG tablet Take 100 mg by mouth daily.     vitamin B-12 (CYANOCOBALAMIN) 100 MCG tablet Take 1,000 mcg by mouth daily.     doxycycline (VIBRAMYCIN) 100 MG capsule TAKE 1 CAPSULE 2 TIMES A DAY WITH FOOD FOR INFECTION (Patient not taking: Reported on 12/17/2021)     fluconazole (DIFLUCAN) 150 MG tablet Take 1 tablet by mouth once. (Patient not taking: Reported on 12/17/2021)     meloxicam (MOBIC) 15 MG tablet Take 1 tablet by mouth daily. (Patient not taking: Reported on 12/17/2021)     No current facility-administered medications on file prior to visit.    ALLERGIES: Amoxicillin-pot clavulanate, Levofloxacin, Other, Codeine, and Sulfa antibiotics  SH:  married, non smoker  Review of Systems  Constitutional: Negative.   Genitourinary:        Vulvar irritation    PHYSICAL EXAMINATION:    BP (!) 130/59 (BP Location: Right Arm, Patient Position: Sitting, Cuff Size: Normal)   Pulse 69   Ht 4' 10.5" (1.486 m) Comment: Reported  Wt 112 lb (50.8 kg)   [name] 10/18/2001   BMI 23.01 kg/m     General appearance: alert, cooperative and appears stated age Lymph:  no inguinal LAD noted  Pelvic: External genitalia:  no lesions              Urethra:  normal appearing urethra with no masses, tenderness or lesions              Bartholins and Skenes: normal                 Vagina: normal appearing vagina with normal color and discharge, no lesions               Chaperone, Octaviano Batty, CMA, was present for exam.  Assessment/Plan: 1. Vaginal irritation - will check again for BV today.   - Cervicovaginal ancillary only( [facility])

## 2021-12-20 ENCOUNTER — Encounter (HOSPITAL_BASED_OUTPATIENT_CLINIC_OR_DEPARTMENT_OTHER): Payer: Self-pay | Admitting: Obstetrics & Gynecology

## 2021-12-20 DIAGNOSIS — N3281 Overactive bladder: Secondary | ICD-10-CM | POA: Diagnosis not present

## 2021-12-20 DIAGNOSIS — D3001 Benign neoplasm of right kidney: Secondary | ICD-10-CM | POA: Diagnosis not present

## 2021-12-20 LAB — CERVICOVAGINAL ANCILLARY ONLY
Bacterial Vaginitis (gardnerella): NEGATIVE
Candida Glabrata: NEGATIVE
Candida Vaginitis: NEGATIVE
Comment: NEGATIVE
Comment: NEGATIVE
Comment: NEGATIVE

## 2021-12-21 ENCOUNTER — Encounter (HOSPITAL_BASED_OUTPATIENT_CLINIC_OR_DEPARTMENT_OTHER): Payer: Self-pay | Admitting: Obstetrics & Gynecology

## 2021-12-23 ENCOUNTER — Ambulatory Visit (HOSPITAL_BASED_OUTPATIENT_CLINIC_OR_DEPARTMENT_OTHER): Payer: PPO

## 2021-12-23 ENCOUNTER — Other Ambulatory Visit (HOSPITAL_COMMUNITY)
Admission: RE | Admit: 2021-12-23 | Discharge: 2021-12-23 | Disposition: A | Discharge status: Home / Self Care | Payer: PPO | Source: Ambulatory Visit | Attending: Obstetrics & Gynecology | Admitting: Obstetrics & Gynecology

## 2021-12-23 DIAGNOSIS — N898 Other specified noninflammatory disorders of vagina: Secondary | ICD-10-CM

## 2021-12-23 DIAGNOSIS — R300 Dysuria: Secondary | ICD-10-CM | POA: Diagnosis not present

## 2021-12-23 NOTE — Progress Notes (Signed)
Patient came in today because she is experiencing vaginal and uretheral burning and irritation. Patient did a self swab. Sent swab out to be evaluated. tbw

## 2021-12-24 ENCOUNTER — Telehealth: Payer: PPO | Admitting: Pharmacy Technician

## 2021-12-24 ENCOUNTER — Encounter (HOSPITAL_BASED_OUTPATIENT_CLINIC_OR_DEPARTMENT_OTHER): Payer: Self-pay | Admitting: Obstetrics & Gynecology

## 2021-12-24 LAB — CERVICOVAGINAL ANCILLARY ONLY
Bacterial Vaginitis (gardnerella): NEGATIVE
Candida Glabrata: NEGATIVE
Candida Vaginitis: NEGATIVE
Comment: NEGATIVE
Comment: NEGATIVE
Comment: NEGATIVE

## 2021-12-30 DIAGNOSIS — M13849 Other specified arthritis, unspecified hand: Secondary | ICD-10-CM | POA: Diagnosis not present

## 2021-12-30 DIAGNOSIS — M65342 Trigger finger, left ring finger: Secondary | ICD-10-CM | POA: Diagnosis not present

## 2021-12-30 DIAGNOSIS — M65331 Trigger finger, right middle finger: Secondary | ICD-10-CM | POA: Diagnosis not present

## 2022-01-02 NOTE — Therapy (Signed)
OUTPATIENT PHYSICAL THERAPY LOWER EXTREMITY EVALUATION   Patient Name: Helen Glover MRN: [id number] DOB:November 17, 1950, 71 y.o., female Today's Date: 01/03/2022   PT End of Session - 01/03/22 1023     Visit Number 2    Number of Visits 5    Date for PT Re-Evaluation 01/14/22    Authorization Type HTA    PT Start Time 1020    PT Stop Time 1100    PT Time Calculation (min) 40 min    Activity Tolerance Patient tolerated treatment well    Behavior During Therapy WFL for tasks assessed/performed              Past Medical History:  Diagnosis Date   Anxiety    Arthritis    in neck   Bursitis of right hip    Carpal tunnel syndrome, bilateral    rt wrist   Complete rotator cuff tear of left shoulder    Eagle's syndrome    GERD (gastroesophageal reflux disease)    Hearing loss of both ears 12/25/2014   Has hearing aids/tubes    Heart murmur    as child   HNP (herniated nucleus pulposus)    Ingrown toenail of right foot 2023   Interstitial cystitis    Kidney cysts    Osteopenia    Plantar fasciitis    Spinal stenosis    Tingling    rt foot   Wears hearing aid    Past Surgical History:  Procedure Laterality Date   ABDOMINAL HYSTERECTOMY  2003   BSO   ANAL SPHINCTEROTOMY     CARPAL TUNNEL RELEASE     LEFT   CERVICAL FUSION  02/25/2018   c1-c2 with laminectomy   CESAREAN SECTION     CYSTOSCOPY     HEMORRHOID SURGERY N/A 1998   KNEE ARTHROSCOPY Right 06/22/2019   Dr. Veverly Fells    LUMBAR LAMINECTOMY     LUMBAR LAMINECTOMY/DECOMPRESSION MICRODISCECTOMY N/A 08/09/2014   Procedure: MICRO LUMBAR DECOMPRESSION L3-4,  REDO DECOMPRESSION, MICRODISCECTOMY L4-5, FORAMINOTOMY L3-L4, L4-L5 BILATERAL;  Surgeon: Susa Day, MD;  Location: [facility];  Service: Orthopedics;  Laterality: N/A;   MYRINGOTOMY     NASAL SINUS SURGERY     TONSILLECTOMY     LEFT only, due to [facility] Syndrome   TONSILLECTOMY  10/14   right side due to [facility] syndrome   TRIGGER FINGER RELEASE     RIGHT    TYMPANOSTOMY TUBE PLACEMENT     Patient Active Problem List   Diagnosis Date Noted   Hemorrhoids 04/10/2021   Osteopenia 02/25/2021   Lumbar back pain with radiculopathy affecting right lower extremity 07/10/2020   Aortic atherosclerosis ([place]) 07/07/2019   Hyperlipidemia    Hypertension    History of prediabetes    Vitamin D deficiency    GERD (gastroesophageal reflux disease) 11/17/2011   Interstitial cystitis     REFERRING PROVIDER:  Esmond Plants, MD  REFERRING DIAG: M17.0 (ICD-10-CM) - bilat primary OA of knees  THERAPY DIAG:  Right knee pain, unspecified chronicity  Left knee pain, unspecified chronicity  Muscle weakness (generalized)  Rationale for Evaluation and Treatment: Rehabilitation  ONSET DATE: August 2023  SUBJECTIVE:   SUBJECTIVE STATEMENT: Pt denies any adverse effects after prior Rx.  Pt states her knees feel better.  Pt reports improved pain with descending stairs.  She doesn't always have pain with descending stairs.  Pt denies bilat knee pain currently.  Pt has increased pain with prolonged ambulation on a hard surface. She reports  3/10 pain in bilat knees after standing during her part time job.   PERTINENT HISTORY: -Chronic bilat knee pain, Spinal stenosis and LBP with R sided radiculopathy, Osteopenia, R hip bursitis, Hearing loss bilat (hearing aids)   -PSHx: R knee arthroscopy on 06/22/2019 3 lumbar surgeries including lumbar laminectomy/decompression/microdiscectomy with the last surgery in 2021 C1-2 posterior laminectomy and fusion on 02/25/18,      PAIN:  Are you having pain? Yes R knee:  Current:  0/10, Best: 2/10, Worst:  9/10 L knee:   Current:  0/10, Best: 0/10, Worst: 4-5/10 Alleviating Factors: lying down, injection Aggravating Factors:  stairs, squatting, kneeling   PRECAUTIONS: Other: chronic knee pain, chronic LBP and 3 lumbar surgeries, and C1-2 fusion  WEIGHT BEARING RESTRICTIONS: No  FALLS:  Has patient fallen in last  6 months? No   OCCUPATION: Pt is retired.  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   PLOF: Independent; Pt had bilat knee pain though had less pain with her functional mobility and volunteer activities.   PATIENT GOALS: to have reduced pain in knees, improve quad strength   OBJECTIVE:   DIAGNOSTIC FINDINGS:  X-rays from MD notes:  both knees demonstrate very mild tricompartmental degenerative changes; lateral of the left knee shows mild medial joint space loss of the right knee and some early patellofemoral OA on the left knee. Overall alignment of the knees is fairly well-preserved.          TODAY'S TREATMENT                                                                                                   Reviewed current function, response to prior Rx, and pain levels. Reviewed HEP and instructed pt in correct form with her current exercises.  Pt performed:  Quad set with 3-5 sec hold x 10-12 reps bilat  Supine heel slide x 10 bilat  Supine SLR x 10 bilat  S/L hip abd 2x10 bilat  LAQ with 2# 2 sets of 10 reps seated on elevated table and 2 sets of 10 reps in chair with feet and back supported   Lateral band walks at rail with RTB around thighs and around ankle   Standing hip abduction x 10 reps bilat  Heel taps on 2 inch step  Pt received a HEP handout and was educated in correct form and appropriate frequency.  Pt instructed she should not have pain with HEP.    PATIENT EDUCATION:  Education details: dx, exercise rationale, exercise form, relevant anatomy, POC, and HEP.  PT answered Pt's questions. Person educated: Patient Education method: Explanation, Demonstration, Tactile cues, and Verbal cues, handout Education comprehension: verbalized understanding, returned demonstration, verbal cues required, tactile cues required, and needs further education  HOME EXERCISE PROGRAM: Access Code: [email or web address] URL:  [email or web address] Date: 01/03/2022 Prepared by: [place] with Ankle Weight  - 1 x daily - 3 x weekly - 2-3 sets - 10 reps - Side Stepping with Resistance at Ankles  - 1 x  daily - 3 x weekly - 2-3 sets - 10 reps  ASSESSMENT:  CLINICAL IMPRESSION: Patient presents to Rx stating her knees are feeling better and she has less pain with descending stairs.  PT reviewed her current home exercises and instructed her in correct form.  Pt demonstrates improved control and form after instruction.  PT progressed exercises though pt has other co-morbidities that affect her including LBP and hip bursitis.  Pt tried S/L hip abd though did have some irritation in hip due lying on hip.  Pt did have some back pain with seated LAQ on table with legs hanging.  PT performed LAQ seated in chair with back supported and feet supported on step and she felt much better.  Pt had no pain or lumbar discomfort with performing LAQ in chair.  Pt did have some R knee pain with 2 inch heel tap, though none on L knee.  PT updated HEP and gave pt a handout.  Pt demonstrated good understanding of HEP.  She responded well to Rx having no increased pain or c/o's after Rx.  Pt should benefit from skilled PT services to address impairments and to improve overall function.       OBJECTIVE IMPAIRMENTS: decreased activity tolerance, decreased mobility, difficulty walking, decreased ROM, decreased strength, hypomobility, impaired flexibility, and pain.   ACTIVITY LIMITATIONS: stairs and locomotion level  PARTICIPATION LIMITATIONS:  volunteer work  PERSONAL FACTORS: 3+ comorbidities: Chronic bilat knee pain, Spinal stenosis and LBP with R sided radiculopathy, R hip bursitis, R knee surgery, C1-2 fusion, and 3 lumbar surgeries  are also affecting patient's functional outcome.   REHAB POTENTIAL: Good  CLINICAL DECISION MAKING: Stable/uncomplicated  EVALUATION COMPLEXITY:  Low   GOALS:   SHORT TERM GOALS:  Pt will report at least a 25% improvement in pain and sx's overall for improved mobility.  Baseline: Goal status: INITIAL Target date:  12/31/2021   2.  Pt will be independent and compliant with HEP for improved pain, strength, ROM, and function.  Baseline:  Goal status: INITIAL Target date:  01/07/2022    LONG TERM GOALS: Target date: 01/14/2022  Pt will demo improved bilat hip flexion and knee extension strength by at least 4-5# and MMT to 5/5 in hip flexion and bilat knee extension for improved performance of and tolerance with functional mobility and volunteer work.  Baseline:  Goal status: INITIAL  2.  Pt will report at least a 60% improvement pain with volunteer work.  Baseline:  Goal status: INITIAL  3.  Pt will demo good control with stairs and perform stairs without significant pain. Baseline:  Goal status: INITIAL    PLAN:  PT FREQUENCY: 1-2x/week  PT DURATION: 4 weeks  PLANNED INTERVENTIONS: Therapeutic exercises, Therapeutic activity, Neuromuscular re-education, Balance training, Gait training, Patient/Family education, Self Care, Stair training, Aquatic Therapy, Dry Needling, Electrical stimulation, Cryotherapy, Moist heat, Taping, Manual therapy, and Re-evaluation  PLAN FOR NEXT SESSION: cont with LE strengthening bilat.  Update HEP as tolerable.   Selinda Michaels III PT, DPT 01/03/22 5:55 PM

## 2022-01-03 ENCOUNTER — Ambulatory Visit (HOSPITAL_BASED_OUTPATIENT_CLINIC_OR_DEPARTMENT_OTHER): Payer: PPO | Attending: Nurse Practitioner | Admitting: Physical Therapy

## 2022-01-03 ENCOUNTER — Encounter (HOSPITAL_BASED_OUTPATIENT_CLINIC_OR_DEPARTMENT_OTHER): Payer: Self-pay | Admitting: Physical Therapy

## 2022-01-03 DIAGNOSIS — M6281 Muscle weakness (generalized): Secondary | ICD-10-CM | POA: Insufficient documentation

## 2022-01-03 DIAGNOSIS — M25562 Pain in left knee: Secondary | ICD-10-CM | POA: Insufficient documentation

## 2022-01-03 DIAGNOSIS — M25561 Pain in right knee: Secondary | ICD-10-CM | POA: Insufficient documentation

## 2022-01-07 ENCOUNTER — Ambulatory Visit (HOSPITAL_BASED_OUTPATIENT_CLINIC_OR_DEPARTMENT_OTHER): Payer: PPO | Admitting: Physical Therapy

## 2022-01-07 DIAGNOSIS — M25562 Pain in left knee: Secondary | ICD-10-CM

## 2022-01-07 DIAGNOSIS — M6281 Muscle weakness (generalized): Secondary | ICD-10-CM

## 2022-01-07 DIAGNOSIS — M25561 Pain in right knee: Secondary | ICD-10-CM

## 2022-01-07 NOTE — Therapy (Signed)
OUTPATIENT PHYSICAL THERAPY LOWER EXTREMITY EVALUATION   Patient Name: Helen Glover MRN: [id number] DOB:1950/09/12, 71 y.o., female Today's Date: 01/08/2022   PT End of Session - 01/07/22 0950     Visit Number 3    Number of Visits 5    Date for PT Re-Evaluation 01/14/22    Authorization Type HTA    PT Start Time 0945    PT Stop Time 1030    PT Time Calculation (min) 45 min    Activity Tolerance Patient tolerated treatment well    Behavior During Therapy WFL for tasks assessed/performed              Past Medical History:  Diagnosis Date   Anxiety    Arthritis    in neck   Bursitis of right hip    Carpal tunnel syndrome, bilateral    rt wrist   Complete rotator cuff tear of left shoulder    Eagle's syndrome    GERD (gastroesophageal reflux disease)    Hearing loss of both ears 12/25/2014   Has hearing aids/tubes    Heart murmur    as child   HNP (herniated nucleus pulposus)    Ingrown toenail of right foot 2023   Interstitial cystitis    Kidney cysts    Osteopenia    Plantar fasciitis    Spinal stenosis    Tingling    rt foot   Wears hearing aid    Past Surgical History:  Procedure Laterality Date   ABDOMINAL HYSTERECTOMY  2003   BSO   ANAL SPHINCTEROTOMY     CARPAL TUNNEL RELEASE     LEFT   CERVICAL FUSION  02/25/2018   c1-c2 with laminectomy   CESAREAN SECTION     CYSTOSCOPY     HEMORRHOID SURGERY N/A 1998   KNEE ARTHROSCOPY Right 06/22/2019   Dr. Veverly Fells    LUMBAR LAMINECTOMY     LUMBAR LAMINECTOMY/DECOMPRESSION MICRODISCECTOMY N/A 08/09/2014   Procedure: MICRO LUMBAR DECOMPRESSION L3-4,  REDO DECOMPRESSION, MICRODISCECTOMY L4-5, FORAMINOTOMY L3-L4, L4-L5 BILATERAL;  Surgeon: Susa Day, MD;  Location: [facility];  Service: Orthopedics;  Laterality: N/A;   MYRINGOTOMY     NASAL SINUS SURGERY     TONSILLECTOMY     LEFT only, due to [facility] Syndrome   TONSILLECTOMY  10/14   right side due to [facility] syndrome   TRIGGER FINGER RELEASE     RIGHT    TYMPANOSTOMY TUBE PLACEMENT     Patient Active Problem List   Diagnosis Date Noted   Hemorrhoids 04/10/2021   Osteopenia 02/25/2021   Lumbar back pain with radiculopathy affecting right lower extremity 07/10/2020   Aortic atherosclerosis ([place]) 07/07/2019   Hyperlipidemia    Hypertension    History of prediabetes    Vitamin D deficiency    GERD (gastroesophageal reflux disease) 11/17/2011   Interstitial cystitis     REFERRING PROVIDER:  Esmond Plants, MD  REFERRING DIAG: M17.0 (ICD-10-CM) - bilat primary OA of knees  THERAPY DIAG:  Right knee pain, unspecified chronicity  Left knee pain, unspecified chronicity  Muscle weakness (generalized)  Rationale for Evaluation and Treatment: Rehabilitation  ONSET DATE: August 2023  SUBJECTIVE:   SUBJECTIVE STATEMENT: Pt denies any adverse effects after prior Rx.  Pt states he quads are stronger.  Pt states she likes the elliptical and used to use the elliptical.    Pt states her knees feel better.  Pt reports improved pain with descending stairs.  Sometimes she doesn't have a problem with  descending stairs.  Pt states she hasn't used the knee sleeve at all.  Pt has increased pain with prolonged ambulation on a hard surface. She reports 3/10 pain in bilat knees after standing during her part time job.  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.  PERTINENT HISTORY: -Chronic bilat knee pain, Spinal stenosis and LBP with R sided radiculopathy, Osteopenia, R hip bursitis, Hearing loss bilat (hearing aids)   -PSHx: R knee arthroscopy on 06/22/2019 3 lumbar surgeries including lumbar laminectomy/decompression/microdiscectomy with the last surgery in 2021 C1-2 posterior laminectomy and fusion on 02/25/18,      PAIN:  Are you having pain? Yes R knee:  Current:  2-3/10, Best: 2/10, Worst:  9/10 L knee:   Current:  1/10 "if that", Best: 0/10, Worst: 4-5/10 Alleviating Factors: lying down, injection Aggravating Factors:  stairs,  squatting, kneeling   PRECAUTIONS: Other: chronic knee pain, chronic LBP and 3 lumbar surgeries, and C1-2 fusion  WEIGHT BEARING RESTRICTIONS: No  FALLS:  Has patient fallen in last 6 months? No   OCCUPATION: Pt is retired.  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   PLOF: Independent; Pt had bilat knee pain though had less pain with her functional mobility and volunteer activities.   PATIENT GOALS: to have reduced pain in knees, improve quad strength   OBJECTIVE:   DIAGNOSTIC FINDINGS:  X-rays from MD notes:  both knees demonstrate very mild tricompartmental degenerative changes; lateral of the left knee shows mild medial joint space loss of the right knee and some early patellofemoral OA on the left knee. Overall alignment of the knees is fairly well-preserved.          TODAY'S TREATMENT                                                                                                   Reviewed current function, response to prior Rx, and pain levels. Reviewed HEP and instructed pt in correct form with her current exercises.  Pt performed:  Elliptical at L1, just LE's x 4 mins  Nustep at L2 3.5 mins (just LE's for approx 2.5 mins and UE/LE for approx 1 min)  Seated Cybex leg press (seat:  1 hole showing) 1 set each at 30# and 40#  Supine SLR x 10 bilat  LAQ with 2# 3 sets of 10 reps in chair with feet and back supported   Lateral band walks at rail with RTB around ankle 2 sets of 10 reps   Heel taps on 2 inch step 2x10-12 on L, 1x10 on R  SLS x 30 sec on L, 13 sec on R without UE support  Marching on airex 2x10 without UE support     PATIENT EDUCATION:  Education details: dx, exercise rationale, exercise form, set up of gym exercises, relevant anatomy, POC, and HEP.  PT answered Pt's questions.  PT instructed pt to not perform gym exercises that increase her knee or back pain. Person educated: Patient Education method:  Explanation, Demonstration, Tactile cues, and Verbal cues,  handout Education comprehension: verbalized understanding, returned demonstration, verbal cues required, tactile cues required, and needs further education  HOME EXERCISE PROGRAM: Access Code: [email or web address] URL: [email or web address] Date: 01/03/2022 Prepared by: Ronny Flurry  Exercises - Seated Long Arc Quad with Ankle Weight  - 1 x daily - 3 x weekly - 2-3 sets - 10 reps - Side Stepping with Resistance at Ankles  - 1 x daily - 3 x weekly - 2-3 sets - 10 reps  ASSESSMENT:  CLINICAL IMPRESSION: Patient is progressing well with pain, sx's, and function as evidenced by subjective reports.  She reports improved pain and performance of descending stairs and has not been using the knee sleeve at all.  Pt's R knee bothers her worse than her L knee.  Pt has questions concerning gym exercises/workouts and PT worked on advancing home/gym program today.  PT had pt perform appropriate gym exercises and assessed pt response today.  Pt could feel her back with the Nustep and did not feel her back with elliptical using just LE's.  PT instructed pt with proper set up of machines and exercises and correct form and positioning.  Pt demonstrates good form and understanding with cuing and instruction.  Pt's L knee felt fine with heel taps though had 4-5/10 pain in R knee with 2 inch heel taps.  Pt tolerated exercises well and responded well to Rx stating her knees and back felt good after Rx.  She denies any increased pain after Rx.  Pt should benefit from skilled PT services to address impairments, establish HEP, and to improve overall function.       OBJECTIVE IMPAIRMENTS: decreased activity tolerance, decreased mobility, difficulty walking, decreased ROM, decreased strength, hypomobility, impaired flexibility, and pain.   ACTIVITY LIMITATIONS: stairs and locomotion level  PARTICIPATION LIMITATIONS:  volunteer work  PERSONAL FACTORS: 3+  comorbidities: Chronic bilat knee pain, Spinal stenosis and LBP with R sided radiculopathy, R hip bursitis, R knee surgery, C1-2 fusion, and 3 lumbar surgeries  are also affecting patient's functional outcome.   REHAB POTENTIAL: Good  CLINICAL DECISION MAKING: Stable/uncomplicated  EVALUATION COMPLEXITY: Low   GOALS:   SHORT TERM GOALS:  Pt will report at least a 25% improvement in pain and sx's overall for improved mobility.  Baseline: Goal status: INITIAL Target date:  12/31/2021   2.  Pt will be independent and compliant with HEP for improved pain, strength, ROM, and function.  Baseline:  Goal status: INITIAL Target date:  01/07/2022    LONG TERM GOALS: Target date: 01/14/2022  Pt will demo improved bilat hip flexion and knee extension strength by at least 4-5# and MMT to 5/5 in hip flexion and bilat knee extension for improved performance of and tolerance with functional mobility and volunteer work.  Baseline:  Goal status: INITIAL  2.  Pt will report at least a 60% improvement pain with volunteer work.  Baseline:  Goal status: INITIAL  3.  Pt will demo good control with stairs and perform stairs without significant pain. Baseline:  Goal status: INITIAL    PLAN:  PT FREQUENCY: 1-2x/week  PT DURATION: 4 weeks  PLANNED INTERVENTIONS: Therapeutic exercises, Therapeutic activity, Neuromuscular re-education, Balance training, Gait training, Patient/Family education, Self Care, Stair training, Aquatic Therapy, Dry Needling, Electrical stimulation, Cryotherapy, Moist heat, Taping, Manual therapy, and Re-evaluation  PLAN FOR NEXT SESSION: cont with LE strengthening bilat.  Update HEP as tolerable.   Selinda Michaels III PT, DPT 01/08/22 8:57 AM

## 2022-01-08 ENCOUNTER — Encounter (HOSPITAL_BASED_OUTPATIENT_CLINIC_OR_DEPARTMENT_OTHER): Payer: Self-pay | Admitting: Physical Therapy

## 2022-01-12 NOTE — Therapy (Signed)
OUTPATIENT PHYSICAL THERAPY LOWER EXTREMITY EVALUATION   Patient Name: Helen Glover MRN: [id number] DOB:16-Mar-1950, 71 y.o., female Today's Date: 01/13/2022   PT End of Session - 01/13/22 0954     Visit Number 4    Number of Visits 5    Date for PT Re-Evaluation 01/14/22    Authorization Type HTA    PT Start Time 0940    PT Stop Time 1018    PT Time Calculation (min) 38 min    Activity Tolerance Patient tolerated treatment well    Behavior During Therapy WFL for tasks assessed/performed               Past Medical History:  Diagnosis Date   Anxiety    Arthritis    in neck   Bursitis of right hip    Carpal tunnel syndrome, bilateral    rt wrist   Complete rotator cuff tear of left shoulder    Eagle's syndrome    GERD (gastroesophageal reflux disease)    Hearing loss of both ears 12/25/2014   Has hearing aids/tubes    Heart murmur    as child   HNP (herniated nucleus pulposus)    Ingrown toenail of right foot 2023   Interstitial cystitis    Kidney cysts    Osteopenia    Plantar fasciitis    Spinal stenosis    Tingling    rt foot   Wears hearing aid    Past Surgical History:  Procedure Laterality Date   ABDOMINAL HYSTERECTOMY  2003   BSO   ANAL SPHINCTEROTOMY     CARPAL TUNNEL RELEASE     LEFT   CERVICAL FUSION  02/25/2018   c1-c2 with laminectomy   CESAREAN SECTION     CYSTOSCOPY     HEMORRHOID SURGERY N/A 1998   KNEE ARTHROSCOPY Right 06/22/2019   Dr. Veverly Fells    LUMBAR LAMINECTOMY     LUMBAR LAMINECTOMY/DECOMPRESSION MICRODISCECTOMY N/A 08/09/2014   Procedure: MICRO LUMBAR DECOMPRESSION L3-4,  REDO DECOMPRESSION, MICRODISCECTOMY L4-5, FORAMINOTOMY L3-L4, L4-L5 BILATERAL;  Surgeon: Susa Day, MD;  Location: [facility];  Service: Orthopedics;  Laterality: N/A;   MYRINGOTOMY     NASAL SINUS SURGERY     TONSILLECTOMY     LEFT only, due to [facility] Syndrome   TONSILLECTOMY  10/14   right side due to [facility] syndrome   TRIGGER FINGER RELEASE     RIGHT    TYMPANOSTOMY TUBE PLACEMENT     Patient Active Problem List   Diagnosis Date Noted   Hemorrhoids 04/10/2021   Osteopenia 02/25/2021   Lumbar back pain with radiculopathy affecting right lower extremity 07/10/2020   Aortic atherosclerosis ([place]) 07/07/2019   Hyperlipidemia    Hypertension    History of prediabetes    Vitamin D deficiency    GERD (gastroesophageal reflux disease) 11/17/2011   Interstitial cystitis     REFERRING PROVIDER:  Esmond Plants, MD  REFERRING DIAG: M17.0 (ICD-10-CM) - bilat primary OA of knees  THERAPY DIAG:  Right knee pain, unspecified chronicity  Left knee pain, unspecified chronicity  Muscle weakness (generalized)  Rationale for Evaluation and Treatment: Rehabilitation  ONSET DATE: August 2023  SUBJECTIVE:   SUBJECTIVE STATEMENT: Pt states her quads are stronger.  Pt reports compliance with HEP.  She ordered a foam pad and already received it.  Pt reports improved pain with descending stairs.  Sometimes she doesn't have a problem with descending stairs.  Pt has increased pain with prolonged ambulation on a hard surface.  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.  Pt states she felt good after prior Rx and had no adverse effects.  Pt had to do 3 flights of stairs while at her daughter's house which may have bothered her some.  She states she is having some spasms in R hip.  Her R knee is hurting and she used a sleeve some this AM. Pt states her L knee feels fine.    PERTINENT HISTORY: -Chronic bilat knee pain, Spinal stenosis and LBP with R sided radiculopathy, Osteopenia, R hip bursitis, Hearing loss bilat (hearing aids)   -PSHx: R knee arthroscopy on 06/22/2019 3 lumbar surgeries including lumbar laminectomy/decompression/microdiscectomy with the last surgery in 2021 C1-2 posterior laminectomy and fusion on 02/25/18,      PAIN:  Are you having pain? Yes R knee:  Current:  5/10, Best: 2/10, Worst:  9/10 L knee:   Current:  1/10 "if  that", Best: 0/10, Worst: 4-5/10 Alleviating Factors: lying down, injection Aggravating Factors:  stairs, squatting, kneeling   PRECAUTIONS: Other: chronic knee pain, chronic LBP and 3 lumbar surgeries, and C1-2 fusion  WEIGHT BEARING RESTRICTIONS: No  FALLS:  Has patient fallen in last 6 months? No   OCCUPATION: Pt is retired.  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   PLOF: Independent; Pt had bilat knee pain though had less pain with her functional mobility and volunteer activities.   PATIENT GOALS: to have reduced pain in knees, improve quad strength   OBJECTIVE:   DIAGNOSTIC FINDINGS:  X-rays from MD notes:  both knees demonstrate very mild tricompartmental degenerative changes; lateral of the left knee shows mild medial joint space loss of the right knee and some early patellofemoral OA on the left knee. Overall alignment of the knees is fairly well-preserved.          TODAY'S TREATMENT                                                                                                   Therapeutic Exercise: Reviewed current function, response to prior Rx, and pain levels. Reviewed HEP. Pt performed:  Elliptical at L1, just LE's x 4 mins  Cybex leg press 40# 2x10, 50# x10, (seat:  1 hole showing)  LAQ with 3# 3 sets of 10 reps in chair with feet and back supported   Lateral band walks at rail with RTB around ankle 2 sets of 10 reps   Marching on airex 2x10 without UE support   Therapeutic Activity:  (for improved functional stability and performance of stairs)  Step ups on airex 2x10 each leg  Lateral step ups on airex x10 reps  Heel taps on 2 inch step x10 on L, on 4 inch step x 10 reps bilat    PATIENT EDUCATION:  Education details:  exercise rationale, exercise form, set up of gym exercises, relevant anatomy, POC, and HEP.  PT answered Pt's questions.  PT instructed pt to not perform gym exercises that increase her  knee or back pain. Person educated:  Patient Education method: Explanation, Demonstration, Tactile cues, and Verbal cues, handout Education comprehension: verbalized understanding, returned demonstration, verbal cues required, tactile cues required, and needs further education  HOME EXERCISE PROGRAM: Access Code: [email or web address] URL: [email or web address] Date: 01/03/2022 Prepared by: Ronny Flurry  Exercises - Seated Long Arc Quad with Ankle Weight  - 1 x daily - 3 x weekly - 2-3 sets - 10 reps - Side Stepping with Resistance at Ankles  - 1 x daily - 3 x weekly - 2-3 sets - 10 reps  ASSESSMENT:  CLINICAL IMPRESSION: Patient is progressing well with pain, sx's, and function.  She continues to have more pain in R knee than L knee.  She performed a lot of stairs at her daughter's house for Thanksgiving.  She had to wear her knee sleeve on R some this AM which she hasn't been using lately.  Pt responded well to prior Rx which used some gym equipment.  Pt performed exercises well today and tolerated the exercises well.  PT increased resistance with leg press and LAQ.  Pt had improved tolerance with heel taps in bilat knees with a higher step (4 inch step).  She responded well to Rx having no c/o's after Rx.  Pt should benefit from skilled PT services to address impairments, establish HEP, and to improve overall function.       OBJECTIVE IMPAIRMENTS: decreased activity tolerance, decreased mobility, difficulty walking, decreased ROM, decreased strength, hypomobility, impaired flexibility, and pain.   ACTIVITY LIMITATIONS: stairs and locomotion level  PARTICIPATION LIMITATIONS:  volunteer work  PERSONAL FACTORS: 3+ comorbidities: Chronic bilat knee pain, Spinal stenosis and LBP with R sided radiculopathy, R hip bursitis, R knee surgery, C1-2 fusion, and 3 lumbar surgeries  are also affecting patient's functional outcome.   REHAB POTENTIAL: Good  CLINICAL DECISION MAKING:  Stable/uncomplicated  EVALUATION COMPLEXITY: Low   GOALS:   SHORT TERM GOALS:  Pt will report at least a 25% improvement in pain and sx's overall for improved mobility.  Baseline: Goal status: INITIAL Target date:  12/31/2021   2.  Pt will be independent and compliant with HEP for improved pain, strength, ROM, and function.  Baseline:  Goal status: INITIAL Target date:  01/07/2022    LONG TERM GOALS: Target date: 01/14/2022  Pt will demo improved bilat hip flexion and knee extension strength by at least 4-5# and MMT to 5/5 in hip flexion and bilat knee extension for improved performance of and tolerance with functional mobility and volunteer work.  Baseline:  Goal status: INITIAL  2.  Pt will report at least a 60% improvement pain with volunteer work.  Baseline:  Goal status: INITIAL  3.  Pt will demo good control with stairs and perform stairs without significant pain. Baseline:  Goal status: INITIAL    PLAN:  PT FREQUENCY: 1-2x/week  PT DURATION: 4 weeks  PLANNED INTERVENTIONS: Therapeutic exercises, Therapeutic activity, Neuromuscular re-education, Balance training, Gait training, Patient/Family education, Self Care, Stair training, Aquatic Therapy, Dry Needling, Electrical stimulation, Cryotherapy, Moist heat, Taping, Manual therapy, and Re-evaluation  PLAN FOR NEXT SESSION: Discharge pt next visit.  Review and update HEP.   Selinda Michaels III PT, DPT 01/13/22 11:32 AM

## 2022-01-13 ENCOUNTER — Ambulatory Visit (HOSPITAL_BASED_OUTPATIENT_CLINIC_OR_DEPARTMENT_OTHER): Payer: PPO | Admitting: Physical Therapy

## 2022-01-13 ENCOUNTER — Encounter (HOSPITAL_BASED_OUTPATIENT_CLINIC_OR_DEPARTMENT_OTHER): Payer: Self-pay | Admitting: Physical Therapy

## 2022-01-13 DIAGNOSIS — M25561 Pain in right knee: Secondary | ICD-10-CM

## 2022-01-13 DIAGNOSIS — M6281 Muscle weakness (generalized): Secondary | ICD-10-CM

## 2022-01-13 DIAGNOSIS — M25562 Pain in left knee: Secondary | ICD-10-CM

## 2022-01-21 ENCOUNTER — Ambulatory Visit (HOSPITAL_BASED_OUTPATIENT_CLINIC_OR_DEPARTMENT_OTHER): Payer: PPO | Attending: Nurse Practitioner | Admitting: Physical Therapy

## 2022-01-21 DIAGNOSIS — M25562 Pain in left knee: Secondary | ICD-10-CM | POA: Diagnosis not present

## 2022-01-21 DIAGNOSIS — M25561 Pain in right knee: Secondary | ICD-10-CM | POA: Insufficient documentation

## 2022-01-21 DIAGNOSIS — M6281 Muscle weakness (generalized): Secondary | ICD-10-CM | POA: Diagnosis not present

## 2022-01-21 NOTE — Therapy (Signed)
OUTPATIENT PHYSICAL THERAPY LOWER EXTREMITY TREATMENT / DISCHARGE   Patient Name: Helen Glover MRN: [id number] DOB:[birth year], 71 y.o., female Today's Date: 01/22/2022   PT End of Session - 01/21/22 1149     Visit Number 5    Number of Visits 5    Authorization Type HTA    PT Start Time 1100    PT Stop Time 1146    PT Time Calculation (min) 46 min    Activity Tolerance Patient tolerated treatment well    Behavior During Therapy WFL for tasks assessed/performed                Past Medical History:  Diagnosis Date   Anxiety    Arthritis    in neck   Bursitis of right hip    Carpal tunnel syndrome, bilateral    rt wrist   Complete rotator cuff tear of left shoulder    Eagle's syndrome    GERD (gastroesophageal reflux disease)    Hearing loss of both ears 12/25/2014   Has hearing aids/tubes    Heart murmur    as child   HNP (herniated nucleus pulposus)    Ingrown toenail of right foot 2023   Interstitial cystitis    Kidney cysts    Osteopenia    Plantar fasciitis    Spinal stenosis    Tingling    rt foot   Wears hearing aid    Past Surgical History:  Procedure Laterality Date   ABDOMINAL HYSTERECTOMY  2003   BSO   ANAL SPHINCTEROTOMY     CARPAL TUNNEL RELEASE     LEFT   CERVICAL FUSION  02/25/2018   c1-c2 with laminectomy   CESAREAN SECTION     CYSTOSCOPY     HEMORRHOID SURGERY N/A 1998   KNEE ARTHROSCOPY Right 06/22/2019   Dr. Ranell Patrick    LUMBAR LAMINECTOMY     LUMBAR LAMINECTOMY/DECOMPRESSION MICRODISCECTOMY N/A 08/09/2014   Procedure: MICRO LUMBAR DECOMPRESSION L3-4,  REDO DECOMPRESSION, MICRODISCECTOMY L4-5, FORAMINOTOMY L3-L4, L4-L5 BILATERAL;  Surgeon: Jene Every, MD;  Location: [facility];  Service: Orthopedics;  Laterality: N/A;   MYRINGOTOMY     NASAL SINUS SURGERY     TONSILLECTOMY     LEFT only, due to [facility] Syndrome   TONSILLECTOMY  10/14   right side due to [facility] syndrome   TRIGGER FINGER RELEASE     RIGHT   TYMPANOSTOMY TUBE  PLACEMENT     Patient Active Problem List   Diagnosis Date Noted   Hemorrhoids 04/10/2021   Osteopenia 02/25/2021   Lumbar back pain with radiculopathy affecting right lower extremity 07/10/2020   Aortic atherosclerosis ([facility]) 07/07/2019   Hyperlipidemia    Hypertension    History of prediabetes    Vitamin D deficiency    GERD (gastroesophageal reflux disease) 11/17/2011   Interstitial cystitis     REFERRING PROVIDER:  Malon Kindle, MD  REFERRING DIAG: M17.0 (ICD-10-CM) - bilat primary OA of knees  THERAPY DIAG:  Right knee pain, unspecified chronicity  Left knee pain, unspecified chronicity  Muscle weakness (generalized)  Rationale for Evaluation and Treatment: Rehabilitation  ONSET DATE: August 2023  SUBJECTIVE:   SUBJECTIVE STATEMENT: Pt denies any adverse effects after prior Rx.  Pt states she came to the gym and used the elliptical and leg press.  She states she had no increased pain with gym exercises.  Pt reports compliance with HEP.  Pt has been performing heel taps.  Pt reports 75% improvement overall in pain and sx's.  Pt reports 50% improvement in pain with standing at work.  Pt states she walked on the track upstairs this AM.  Pt plans to work with a trainer here at [employer].   PERTINENT HISTORY: -Chronic bilat knee pain, Spinal stenosis and LBP with R sided radiculopathy, Osteopenia, R hip bursitis, Hearing loss bilat (hearing aids)   -PSHx: R knee arthroscopy on 06/22/2019 3 lumbar surgeries including lumbar laminectomy/decompression/microdiscectomy with the last surgery in 2021 C1-2 posterior laminectomy and fusion on 02/25/18,      PAIN:  Are you having pain? Yes R knee:  Current:  2/10, Best: 2/10, Worst:  7/10 L knee:   Current:  1/10, Best: 0/10, Worst: 3-4/10 Alleviating Factors: lying down, injection Aggravating Factors:  stairs, squatting, kneeling   PRECAUTIONS: Other: chronic knee pain, chronic LBP and 3 lumbar surgeries, and C1-2  fusion  WEIGHT BEARING RESTRICTIONS: No  FALLS:  Has patient fallen in last 6 months? No   OCCUPATION: Pt is retired.  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   PLOF: Independent; Pt had bilat knee pain though had less pain with her functional mobility and volunteer activities.   PATIENT GOALS: to have reduced pain in knees, improve quad strength   OBJECTIVE:   DIAGNOSTIC FINDINGS:  X-rays from MD notes:  both knees demonstrate very mild tricompartmental degenerative changes; lateral of the left knee shows mild medial joint space loss of the right knee and some early patellofemoral OA on the left knee. Overall alignment of the knees is fairly well-preserved.          TODAY'S TREATMENT                                                                                                   Therapeutic Exercise: -Reviewed current function, response to prior Rx, Hep compliance, and pain levels. -Assessed LE strength   MMT Right eval Left eval Right 12/5 Left 12/5  Hip flexion 4+/5; 22.1 5/5; 24.1 5/5 ; 27.0 5/5 ; 34.6  Hip extension        Hip abduction 5/5 5/5    Hip adduction        Hip internal rotation        Hip external rotation 5/5 5/5    Knee flexion 5/5 in sitting 5/5 in sitting; Attempted in prone but HS cramping    Knee extension 4+/5; 36.0 4/5; 31.0 5/5 ; 32.1 though pt had a bruise on LE from prior 5/5 ; 37.5  Ankle dorsiflexion        Ankle plantarflexion        Ankle inversion        Ankle eversion         (Blank rows = not tested)    -Pt performed stairs with a reciprocal gait with rail ascending and descending with good control with minimal L knee pain with descending.    -Reviewed HEP and updated HEP.  Pt received an updated HEP and educated pt in correct form and appropriate frequency.   Pt performed:  Elliptical  at L1, just LE's x 5 mins  Cybex leg press 50# 2x10,  55# 1X10 (seat:  1 hole  showing)  Lateral band walks at rail with RTB around ankle 2 sets of 10 reps   Heel taps on 4 inch step x 10 reps bilat   Neuro Re-ed Activities:  Marching on airex 2x10 without UE support   SLS x 30 sec bilat without UE support  Step ups on airex 2x10 each leg  PATIENT SURVEYS:  FOTO Initial/Current: 61/72 with a goal of 68 at visit #13    PATIENT EDUCATION:  Education details:  exercise rationale, exercise form, set up of gym exercises, relevant anatomy, POC, and HEP.  PT answered Pt's questions.  PT instructed pt to not perform gym exercises that increase her knee or back pain. Person educated: Patient Education method: Explanation, Demonstration, Tactile cues, and Verbal cues, handout Education comprehension: verbalized understanding, returned demonstration, verbal cues required, tactile cues required, and needs further education  HOME EXERCISE PROGRAM: Access Code: [email or web address] URL: [email or web address] Date: 01/03/2022 Prepared by: Aaron Edelman  Exercises - Seated Long Arc Quad with Ankle Weight  - 1 x daily - 3 x weekly - 2-3 sets - 10 reps - Side Stepping with Resistance at Ankles  - 1 x daily - 3 x weekly - 2-3 sets - 10 reps  Updated HEP:  - Standing March with Counter Support  - 1 x daily - 4-5 x weekly - 2 sets - 10 reps - Forward Step Up with Counter Support  - 1 x daily - 3 x weekly - 2 sets - 10 reps - Standing Single Leg Stance with Counter Support  - 1 x daily - 4 x weekly - 3 reps - 20-30 seconds hold - Lateral Step Down  - 1 x daily - 3 x weekly - 2 sets - 10 reps  ASSESSMENT:  CLINICAL IMPRESSION: Pt has made great progress in PT.  Pt reports 75% improvement overall in pain and sx's and reduced worst pain.  Pt reports improved pain with standing at work also.  She demonstrated improved strength in bilat hip flexion and knee extension as evidenced by HHD and MMT.  Her HHD strength was lower on R quad compared to initial testing which may have been  influenced by the bruise on distal R LE.  Pt able to perform a reciprocal gait on stairs with rail ascending and descending.  Pt demonstrates improved self perceived disability with FOTO score improving from 61 to 72.  Pt met her FOTO goal.  PT updated HEP and educated pt concerning gym program.  She is independent with updated HEP.  Pt has met STG's #1,2 and LTG #3 and partially met LTG's #1,2.  Pt is ready for discharge.    OBJECTIVE IMPAIRMENTS: decreased activity tolerance, decreased mobility, difficulty walking, decreased ROM, decreased strength, hypomobility, impaired flexibility, and pain.   ACTIVITY LIMITATIONS: stairs and locomotion level  PARTICIPATION LIMITATIONS:  volunteer work  PERSONAL FACTORS: 3+ comorbidities: Chronic bilat knee pain, Spinal stenosis and LBP with R sided radiculopathy, R hip bursitis, R knee surgery, C1-2 fusion, and 3 lumbar surgeries  are also affecting patient's functional outcome.   REHAB POTENTIAL: Good  CLINICAL DECISION MAKING: Stable/uncomplicated  EVALUATION COMPLEXITY: Low   GOALS:   SHORT TERM GOALS:  Pt will report at least a 25% improvement in pain and sx's overall for improved mobility.  Baseline: Goal status: GOAL MET Target date:  12/31/2021   2.  Pt will be  independent and compliant with HEP for improved pain, strength, ROM, and function.  Baseline:  Goal status: GOAL MET Target date:  01/07/2022    LONG TERM GOALS: Target date: 01/14/2022  Pt will demo improved bilat hip flexion and knee extension strength by at least 4-5# and MMT to 5/5 in hip flexion and bilat knee extension for improved performance of and tolerance with functional mobility and volunteer work.  Baseline:  Goal status: 80% met  2.  Pt will report at least a 60% improvement pain with volunteer work.  Baseline:  Goal status: 83% MET  3.  Pt will demo good control with stairs and perform stairs without significant pain. Baseline:  Goal status: GOAL  MET    PLAN:  PT FREQUENCY: 1 visit  PT DURATION: 1 visit  PLANNED INTERVENTIONS: Therapeutic exercises, Therapeutic activity, Neuromuscular re-education, Balance training, Gait training, Patient/Family education, Self Care, Stair training, Aquatic Therapy, Dry Needling, Electrical stimulation, Cryotherapy, Moist heat, Taping, Manual therapy, and Re-evaluation  PLAN FOR NEXT SESSION: Pt to be discharged from skilled PT services due to good functional progress and progress toward goals.  Pt is agreeable with discharge.  Pt is independent with HEP and will cont with gym and home exercises.    PHYSICAL THERAPY DISCHARGE SUMMARY  Visits from Start of Care: 5  Current functional level related to goals / functional outcomes: See above   Remaining deficits: See above   Education / Equipment: See above     Audie Clear III PT, DPT 01/22/22 4:26 PM

## 2022-01-22 ENCOUNTER — Encounter: Payer: Self-pay | Admitting: Internal Medicine

## 2022-01-22 ENCOUNTER — Encounter (HOSPITAL_BASED_OUTPATIENT_CLINIC_OR_DEPARTMENT_OTHER): Payer: Self-pay | Admitting: Physical Therapy

## 2022-01-25 NOTE — Progress Notes (Unsigned)
Assessment and Plan:  There are no diagnoses linked to this encounter.    Further disposition pending results of labs. Discussed med's effects and SE's.   Over 30 minutes of exam, counseling, chart review, and critical decision making was performed.   Future Appointments  Date Time Provider [place]  01/27/2022 10:30 AM [name], NP [facility] None  07/10/2022  9:30 AM [name], NP [facility] None  10/14/2022  9:30 AM Unk Pinto, MD [facility] None  11/25/2022  9:00 AM [name], NP [facility] None    ------------------------------------------------------------------------------------------------------------------   HPI [name] 10/18/2001  71 y.o.female presents for  Past Medical History:  Diagnosis Date   Anxiety    Arthritis    in neck   Bursitis of right hip    Carpal tunnel syndrome, bilateral    rt wrist   Complete rotator cuff tear of left shoulder    Eagle's syndrome    GERD (gastroesophageal reflux disease)    Hearing loss of both ears 12/25/2014   Has hearing aids/tubes    Heart murmur    as child   HNP (herniated nucleus pulposus)    Ingrown toenail of right foot 2023   Interstitial cystitis    Kidney cysts    Osteopenia    Plantar fasciitis    Spinal stenosis    Tingling    rt foot   Wears hearing aid      Allergies  Allergen Reactions   Amoxicillin-Pot Clavulanate Other (See Comments)   Levofloxacin Diarrhea   Other     Band-aid   Codeine Hives   Sulfa Antibiotics Nausea And Vomiting    Current Outpatient Medications on File Prior to Visit  Medication Sig   clindamycin (CLEOCIN) 2 % vaginal cream Place 1 Applicatorful vaginally at bedtime. Use for 7 nights. (Patient not taking: Reported on 12/17/2021)   Ascorbic Acid (VITAMIN C) 100 MG tablet Take 1,000 mg by mouth daily.   Biotin 1 MG CAPS Take 1 capsule by mouth daily.    calcium carbonate (OS-CAL - DOSED IN MG OF ELEMENTAL CALCIUM) 1250 (500 Ca) MG  tablet Take 1 tablet by mouth daily with breakfast.   cholecalciferol (VITAMIN D) 1000 UNITS tablet Take 3,000 Units by mouth daily.    doxycycline (VIBRAMYCIN) 100 MG capsule TAKE 1 CAPSULE 2 TIMES A DAY WITH FOOD FOR INFECTION (Patient not taking: Reported on 12/17/2021)   estradiol (VIVELLE-DOT) 0.025 MG/24HR Place 1 patch onto the skin 2 (two) times a week.   fluconazole (DIFLUCAN) 150 MG tablet Take 1 tablet by mouth once. (Patient not taking: Reported on 12/17/2021)   GABAPENTIN PO Take 100 mg by mouth at bedtime.   hydrocortisone (ANUSOL-HC) 2.5 % rectal cream Place 1 Application rectally 2 (two) times daily.   L-Arginine 1000 MG TABS Take 1 tablet by mouth daily.   meloxicam (MOBIC) 15 MG tablet Take 1 tablet by mouth daily. (Patient not taking: Reported on 12/17/2021)   omeprazole (PRILOSEC) 20 MG capsule Take  1 capsule  Daily  to Prevent Heartburn & Indigestion  (Dx:  k21.9  )   Probiotic Product (PROBIOTIC PO) Take 1 tablet by mouth daily.   pyridOXINE (VITAMIN B-6) 100 MG tablet Take 100 mg by mouth daily.   vitamin B-12 (CYANOCOBALAMIN) 100 MCG tablet Take 1,000 mcg by mouth daily.   No current facility-administered medications on file prior to visit.    ROS: all negative except above.   Physical Exam:  [name] 10/18/2001   General  Appearance: Well nourished, in no apparent distress. Eyes: PERRLA, EOMs, conjunctiva no swelling or erythema Sinuses: No Frontal/maxillary tenderness ENT/Mouth: Ext aud canals clear, TMs without erythema, bulging. No erythema, swelling, or exudate on post pharynx.  Tonsils not swollen or erythematous. Hearing normal.  Neck: Supple, thyroid normal.  Respiratory: Respiratory effort normal, BS equal bilaterally without rales, rhonchi, wheezing or stridor.  Cardio: RRR with no MRGs. Brisk peripheral pulses without edema.  Abdomen: Soft, + BS.  Non tender, no guarding, rebound, hernias, masses. Lymphatics: Non tender without lymphadenopathy.   Musculoskeletal: Full ROM, 5/5 strength, normal gait.  Skin: Warm, dry without rashes, lesions, ecchymosis.  Neuro: Cranial nerves intact. Normal muscle tone, no cerebellar symptoms. Sensation intact.  Psych: Awake and oriented X 3, normal affect, Insight and Judgment appropriate.     [name], NP 11:13 AM [facility]

## 2022-01-26 ENCOUNTER — Encounter: Payer: Self-pay | Admitting: Nurse Practitioner

## 2022-01-27 ENCOUNTER — Encounter: Payer: Self-pay | Admitting: Nurse Practitioner

## 2022-01-27 ENCOUNTER — Ambulatory Visit (INDEPENDENT_AMBULATORY_CARE_PROVIDER_SITE_OTHER): Payer: PPO | Admitting: Nurse Practitioner

## 2022-01-27 VITALS — BP 110/70 | HR 77 | Temp 97.6°F | Resp 17 | Ht 58.5 in | Wt 113.0 lb

## 2022-01-27 DIAGNOSIS — R600 Localized edema: Secondary | ICD-10-CM | POA: Diagnosis not present

## 2022-01-27 DIAGNOSIS — I10 Essential (primary) hypertension: Secondary | ICD-10-CM

## 2022-01-27 NOTE — Patient Instructions (Signed)
Overview Lipedema causes abnormal fat buildup in your lower body, getting worse over time. Lipedema causes abnormal fat buildup in your lower body, getting worse with time. What is lipedema? Lipedema is a long-term condition that causes abnormal fat buildup in the lower part of your body. Lipedema most often involves your butt, thighs and calves. Some people have it in their hips or upper arms. It doesn't affect your hands or feet.  People sometimes confuse lipedema with having overweight or lymphedema, but these are different conditions. However, lipedema can lead to lymphedema. Many people with lipedema have a body mass index (BMI) higher than 35.  Dieting and exercising can cause you to lose weight in your upper body without changing the areas lipedema affects in your lower body.  Types of lipedema You may have more than one type of lipedema at a time, depending on where you have symptoms. Types of lipedema include:  Type I: Fat is between your belly button and your hips. Type II: Fat is between your pelvis and knees. Type III: Fat is between your pelvis and ankles. Type IV: Fat is between your shoulders and wrists. Type V: Fat is between your knees and ankles. How common is lipedema? Researchers estimate that 1 in 72,000 people have lipedema. But this number is probably low because lipedema can look like obesity or lymphedema. Another global estimate says 11% of women and people assigned female at birth (AFAB) have lipedema. The condition is rare in men and people assigned female at birth (3).  Symptoms and Causes What are the symptoms of lipedema? Lipedema symptoms include:  Fat buildup in your butt, thighs, calves and sometimes upper arms on both sides of your body. Bumps inside the fat that feel like there's something under your skin. Pain that can be from mild to severe and from constant to only with pressure. A heavy feeling in your legs. Swelling. Skin that bruises  easily. Fatigue (feeling more tired than usual). What causes lipedema? The exact cause of lipedema is unknown. But the condition runs in families in 20% to 60% of cases, so you may inherit it. The condition occurs almost exclusively in women and people AFAB.  Lipedema may have a connection to hormones because it usually starts or gets worse during:  Puberty. Pregnancy. Menopause. The time when you're taking birth control pills, which contain hormones. Having obesity doesn't cause lipedema, but more than half of people with this condition have a BMI higher than 35.  What are the risk factors for lipedema? You're more likely to get lipedema if you:  Are assigned female at birth. Have a family history of lipedema. Have a BMI higher than 35. What are the complications of lipedema? Lipedema can lead to:  Difficulty with walking. Feelings of embarrassment and anxiety. Depression. Secondary lymphedema or lipo-lymphedema (blockage in your lymphatic pathway that allows a fluid called lymph to build up). Venous (vein) disease. Flat feet. Joint issues. Knock knees (knees touch each other when your feet are apart). Diagnosis and Tests How is lipedema diagnosed? A healthcare provider can diagnose you by doing a physical exam and collecting your medical history. Painful fat deposits make lipedema different from ordinary body fat, which doesn't hurt. Also, people with lipedema have a clear difference in size between their unaffected feet and their affected legs.  What tests will be done to diagnose lipedema? Providers don't have a go-to test they use to diagnose lipedema. But they can do blood tests and imaging to rule out  other issues or find other conditions you may have with lipedema.  Tests they may order include:  Ultrasound, which uses sound waves. DEXA scan, a bone density test using X-rays. Magnetic resonance imaging (MRI), a scan using radio waves, a large magnet and a  computer. Computed tomography (CT), a scan using X-rays and a computer. Nuclear medicine imaging, a scan that makes images from an injected radioactive substance. Stages of lipedema Lipedema slowly worsens with time in many people. Lipedema stages include:  Stage 1: Your skin looks normal, but you can feel something like pebbles under your skin. You can have pain and bruising at this stage. Stage 2: Your skin surface is uneven and may have dimpling that looks like quilted stitching, a walnut shell or cottage cheese. Stage 3: Your legs can look like inflated rectangular balloons and you have large folds of skin and fat. Fat on your legs may stick out, making it hard to walk. Stage 4: You have lipedema and lymphedema at the same time. Management and Treatment How is lipedema treated? Researchers haven't found a cure, but lipedema treatments can help you feel better by reducing pain and inflammation. You can start with simple, noninvasive treatment for lipedema and switch to more complex treatments if needed.  Simple treatments Lipedema treatment may include:  Exercise. Swimming, biking and walking help improve mobility and reduce swelling. Exercising in a pool can reduce stress on your joints, too. An anti-inflammatory diet. A heart-healthy diet. This may help slow the progression of lipedema, especially if you learn about your condition early on. But dieting usually doesn't get rid of lipedema like it does with other fat. Compression stockings. Skin moisturizer. Medications or supplements (amphetamines, phentermine, metformin, resveratrol, diosmin and selenium) can help with inflammation, swelling and other issues. Antioxidant herbal medicine. Noninvasive treatments Your provider may suggest noninvasive treatments for lipedema, like:  Lymphatic drainage massage, a gentle form of skin stretching/massage. Complex decongestive therapy, a massage with a compression wrap afterward. Pneumatic  compression device, which you wear on your legs.

## 2022-01-29 ENCOUNTER — Encounter: Payer: Self-pay | Admitting: Internal Medicine

## 2022-01-31 DIAGNOSIS — K649 Unspecified hemorrhoids: Secondary | ICD-10-CM | POA: Diagnosis not present

## 2022-01-31 DIAGNOSIS — Z86010 Personal history of colonic polyps: Secondary | ICD-10-CM | POA: Diagnosis not present

## 2022-01-31 DIAGNOSIS — R197 Diarrhea, unspecified: Secondary | ICD-10-CM | POA: Diagnosis not present

## 2022-02-07 ENCOUNTER — Other Ambulatory Visit: Payer: Self-pay

## 2022-02-07 ENCOUNTER — Emergency Department (HOSPITAL_BASED_OUTPATIENT_CLINIC_OR_DEPARTMENT_OTHER): Payer: PPO

## 2022-02-07 ENCOUNTER — Encounter (HOSPITAL_BASED_OUTPATIENT_CLINIC_OR_DEPARTMENT_OTHER): Payer: Self-pay | Admitting: Emergency Medicine

## 2022-02-07 ENCOUNTER — Emergency Department (HOSPITAL_BASED_OUTPATIENT_CLINIC_OR_DEPARTMENT_OTHER)
Admission: EM | Admit: 2022-02-07 | Discharge: 2022-02-07 | Disposition: A | Discharge status: Home / Self Care | Payer: PPO | Attending: Emergency Medicine | Admitting: Emergency Medicine

## 2022-02-07 DIAGNOSIS — R1030 Lower abdominal pain, unspecified: Secondary | ICD-10-CM | POA: Diagnosis not present

## 2022-02-07 DIAGNOSIS — R1033 Periumbilical pain: Secondary | ICD-10-CM | POA: Diagnosis not present

## 2022-02-07 DIAGNOSIS — M545 Low back pain, unspecified: Secondary | ICD-10-CM | POA: Insufficient documentation

## 2022-02-07 DIAGNOSIS — G8929 Other chronic pain: Secondary | ICD-10-CM | POA: Insufficient documentation

## 2022-02-07 DIAGNOSIS — I10 Essential (primary) hypertension: Secondary | ICD-10-CM | POA: Insufficient documentation

## 2022-02-07 DIAGNOSIS — R109 Unspecified abdominal pain: Secondary | ICD-10-CM | POA: Diagnosis present

## 2022-02-07 DIAGNOSIS — I700 Atherosclerosis of aorta: Secondary | ICD-10-CM | POA: Diagnosis not present

## 2022-02-07 LAB — URINALYSIS, ROUTINE W REFLEX MICROSCOPIC
Bilirubin Urine: NEGATIVE
Glucose, UA: NEGATIVE mg/dL
Hgb urine dipstick: NEGATIVE
Ketones, ur: NEGATIVE mg/dL
Leukocytes,Ua: NEGATIVE
Nitrite: NEGATIVE
Protein, ur: NEGATIVE mg/dL
Specific Gravity, Urine: 1.013 (ref 1.005–1.030)
pH: 6.5 (ref 5.0–8.0)

## 2022-02-07 LAB — COMPREHENSIVE METABOLIC PANEL
ALT: 15 U/L (ref 0–44)
AST: 19 U/L (ref 15–41)
Albumin: 4.2 g/dL (ref 3.5–5.0)
Alkaline Phosphatase: 53 U/L (ref 38–126)
Anion gap: 7 (ref 5–15)
BUN: 15 mg/dL (ref 8–23)
CO2: 28 mmol/L (ref 22–32)
Calcium: 9.1 mg/dL (ref 8.9–10.3)
Chloride: 106 mmol/L (ref 98–111)
Creatinine, Ser: 0.6 mg/dL (ref 0.44–1.00)
GFR, Estimated: >60 mL/min (ref >60)
Glucose, Bld: 107 mg/dL — ABNORMAL HIGH (ref 70–99)
Potassium: 3.5 mmol/L (ref 3.5–5.1)
Sodium: 141 mmol/L (ref 135–145)
Total Bilirubin: 0.3 mg/dL (ref 0.3–1.2)
Total Protein: 6.5 g/dL (ref 6.5–8.1)

## 2022-02-07 LAB — LIPASE, BLOOD: Lipase: 26 U/L (ref 11–51)

## 2022-02-07 LAB — CBC WITH DIFFERENTIAL/PLATELET
Abs Immature Granulocytes: 0.01 10*3/uL (ref 0.00–0.07)
Basophils Absolute: 0.1 10*3/uL (ref 0.0–0.1)
Basophils Relative: 1 %
Eosinophils Absolute: 0.1 10*3/uL (ref 0.0–0.5)
Eosinophils Relative: 2 %
HCT: 37.6 % (ref 36.0–46.0)
Hemoglobin: 12.7 g/dL (ref 12.0–15.0)
Immature Granulocytes: 0 %
Lymphocytes Relative: 38 %
Lymphs Abs: 2.1 10*3/uL (ref 0.7–4.0)
MCH: 32.4 pg (ref 26.0–34.0)
MCHC: 33.8 g/dL (ref 30.0–36.0)
MCV: 95.9 fL (ref 80.0–100.0)
Monocytes Absolute: 0.6 10*3/uL (ref 0.1–1.0)
Monocytes Relative: 10 %
Neutro Abs: 2.6 10*3/uL (ref 1.7–7.7)
Neutrophils Relative %: 49 %
Platelets: 232 10*3/uL (ref 150–400)
RBC: 3.92 MIL/uL (ref 3.87–5.11)
RDW: 12.9 % (ref 11.5–15.5)
WBC: 5.4 10*3/uL (ref 4.0–10.5)
nRBC: 0 % (ref 0.0–0.2)

## 2022-02-07 MED ORDER — IOHEXOL 300 MG/ML  SOLN
100.0000 mL | Freq: Once | INTRAMUSCULAR | Status: AC | PRN
  Administered 2022-02-07: 60 mL via INTRAVENOUS

## 2022-02-07 MED ORDER — SODIUM CHLORIDE 0.9 % IV BOLUS
1000.0000 mL | Freq: Once | INTRAVENOUS | Status: AC
  Administered 2022-02-07: 1000 mL via INTRAVENOUS

## 2022-02-07 MED ORDER — TRAMADOL HCL 50 MG PO TABS: 50.0000 mg | ORAL_TABLET | Freq: Four times a day (QID) | ORAL | 0 refills | Status: DC | PRN

## 2022-02-07 NOTE — Discharge Instructions (Addendum)
Begin taking tramadol as prescribed as needed for pain.  Follow-up with primary doctor next week if not improving, and return to the ER if you develop worsening pain, high fever, bloody stools, or other new and concerning symptoms.

## 2022-02-07 NOTE — ED Provider Notes (Signed)
[facility] EMERGENCY DEPT Provider Note   CSN: [id number] Arrival date & time: 02/07/22  0502     History  Chief Complaint  Patient presents with   Abdominal Pain    Helen Glover is a 71 y.o. female.  Patient is a 71 year old female with past medical history of GERD, interstitial cystitis, hypertension, hyperlipidemia, and chronic low back pain.  Patient presenting today with complaints of abdominal and back discomfort.  This has been worsening over the past several days.  She describes cramping to the center of her abdomen with no vomiting, diarrhea or constipation.  She denies any fevers or chills.  She denies any bloody stools.  The history is provided by the patient.       Home Medications Prior to Admission medications   Medication Sig Start Date End Date Taking? Authorizing Provider  clindamycin (CLEOCIN) 2 % vaginal cream Place 1 Applicatorful vaginally at bedtime. Use for 7 nights. Patient not taking: Reported on 12/17/2021 12/09/21   Megan Salon, MD  Ascorbic Acid (VITAMIN C) 100 MG tablet Take 1,000 mg by mouth daily.    [provider]  Biotin 1 MG CAPS Take 1 capsule by mouth daily.     [provider]  calcium carbonate (OS-CAL - DOSED IN MG OF ELEMENTAL CALCIUM) 1250 (500 Ca) MG tablet Take 1 tablet by mouth daily with breakfast.    [provider]  cholecalciferol (VITAMIN D) 1000 UNITS tablet Take 3,000 Units by mouth daily.     [provider]  doxycycline (VIBRAMYCIN) 100 MG capsule TAKE 1 CAPSULE 2 TIMES A DAY WITH FOOD FOR INFECTION Patient not taking: Reported on 12/17/2021    [provider]  estradiol (VIVELLE-DOT) 0.025 MG/24HR Place 1 patch onto the skin 2 (two) times a week. 10/07/21   Megan Salon, MD  fluconazole (DIFLUCAN) 150 MG tablet Take 1 tablet by mouth once. Patient not taking: Reported on 12/17/2021    [provider]  GABAPENTIN PO Take 100 mg by mouth at bedtime.     [provider]  hydrocortisone (ANUSOL-HC) 2.5 % rectal cream Place 1 Application rectally 2 (two) times daily. 12/09/21   [name], NP  L-Arginine 1000 MG TABS Take 1 tablet by mouth daily.    [provider]  meloxicam (MOBIC) 15 MG tablet Take 1 tablet by mouth daily. Patient not taking: Reported on 12/17/2021 11/06/21   [provider]  omeprazole (PRILOSEC) 20 MG capsule Take  1 capsule  Daily  to Prevent Heartburn & Indigestion  (Dx:  k21.9  ) 10/23/21   Unk Pinto, MD  Probiotic Product (PROBIOTIC PO) Take 1 tablet by mouth daily.    [provider]  pyridOXINE (VITAMIN B-6) 100 MG tablet Take 100 mg by mouth daily.    [provider]  vitamin B-12 (CYANOCOBALAMIN) 100 MCG tablet Take 1,000 mcg by mouth daily.    [provider]      Allergies    Amoxicillin-pot clavulanate, Levofloxacin, Other, Codeine, and Sulfa antibiotics    Review of Systems   Review of Systems  All other systems reviewed and are negative.   Physical Exam Updated Vital Signs BP (!) 152/74   Pulse 60   Temp (!) 97.4 F (36.3 C) (Oral)   Resp 18   Ht 4' 10.5" (1.486 m)   Wt 49.9 kg   [name] 10/18/2001   SpO2 100%   BMI 22.60 kg/m  Physical Exam Vitals and nursing note  reviewed.  Constitutional:      General: She is not in acute distress.    Appearance: She is well-developed. She is not diaphoretic.  HENT:     Head: Normocephalic and atraumatic.  Cardiovascular:     Rate and Rhythm: Normal rate and regular rhythm.     Heart sounds: No murmur heard.    No friction rub. No gallop.  Pulmonary:     Effort: Pulmonary effort is normal. No respiratory distress.     Breath sounds: Normal breath sounds. No wheezing.  Abdominal:     General: Bowel sounds are normal. There is no distension.     Palpations: Abdomen is soft.     Tenderness: There is abdominal tenderness in the periumbilical area. There is no right CVA tenderness, left CVA  tenderness, guarding or rebound.  Musculoskeletal:        General: Normal range of motion.     Cervical back: Normal range of motion and neck supple.  Skin:    General: Skin is warm and dry.  Neurological:     General: No focal deficit present.     Mental Status: She is alert and oriented to person, place, and time.     ED Results / Procedures / Treatments   Labs (all labs ordered are listed, but only abnormal results are displayed) Labs Reviewed  COMPREHENSIVE METABOLIC PANEL  LIPASE, BLOOD  CBC WITH DIFFERENTIAL/PLATELET  URINALYSIS, ROUTINE W REFLEX MICROSCOPIC    EKG None  Radiology No results found.  Procedures Procedures    Medications Ordered in ED Medications  sodium chloride 0.9 % bolus 1,000 mL (has no administration in time range)    ED Course/ Medical Decision Making/ A&P  Patient presenting with complaints of abdominal and back pain as described in the HPI.  She has tenderness in the periumbilical region, but exam otherwise unremarkable.  Workup initiated including CBC, CMP, lipase, and urinalysis.  These were all basically unremarkable.  Patient has undergone a CT scan which shows no acute intra-abdominal process.  At this point I feel as though I have excluded emergent pathology and the patient can safely be discharged.  She will be given tramadol which she can take as needed for pain.  She is to return if symptoms worsen or change.  Final Clinical Impression(s) / ED Diagnoses Final diagnoses:  None    Rx / DC Orders ED Discharge Orders     None         Veryl Speak, MD 02/07/22 [phone]

## 2022-02-07 NOTE — ED Triage Notes (Signed)
Pt c/o worsening abdominal pain x 4 days and lower back pain x 2 months.

## 2022-02-18 ENCOUNTER — Encounter: Payer: Self-pay | Admitting: Nurse Practitioner

## 2022-02-20 DIAGNOSIS — M47812 Spondylosis without myelopathy or radiculopathy, cervical region: Secondary | ICD-10-CM | POA: Diagnosis not present

## 2022-02-20 DIAGNOSIS — M47816 Spondylosis without myelopathy or radiculopathy, lumbar region: Secondary | ICD-10-CM | POA: Diagnosis not present

## 2022-02-27 DIAGNOSIS — R300 Dysuria: Secondary | ICD-10-CM | POA: Diagnosis not present

## 2022-03-03 ENCOUNTER — Encounter: Payer: Self-pay | Admitting: Nurse Practitioner

## 2022-03-03 DIAGNOSIS — M47816 Spondylosis without myelopathy or radiculopathy, lumbar region: Secondary | ICD-10-CM | POA: Diagnosis not present

## 2022-03-03 NOTE — Progress Notes (Signed)
Assessment and Plan: Helen Glover was seen today for acute visit.  Diagnoses and all orders for this visit:  Essential hypertension - continue DASH diet, exercise and monitor at home. Call if greater than 130/80.   Acute right ankle pain Will obtain Xray, alternate ice and heat If no cause identified with xray will refer to orthopedics -     DG Ankle Complete Right; Future       Further disposition pending results of labs. Discussed med's effects and SE's.   Over 30 minutes of exam, counseling, chart review, and critical decision making was performed.   Future Appointments  Date Time Provider [place]  07/10/2022  9:30 AM Helen Rossetti, NP [facility] None  10/14/2022  9:30 AM Unk Pinto, MD [facility] None  11/25/2022  9:00 AM Helen Rossetti, NP [facility] None    ------------------------------------------------------------------------------------------------------------------   HPI BP 100/64   Pulse 74   Temp 97.9 F (36.6 C)   Ht 4' 10.5" (1.486 m)   Wt 112 lb 6.4 oz (51 kg)   [name] 10/18/2001   SpO2 99%   BMI 23.09 kg/m    72 y.o.female presents for acute pain in right ankle of one particular area which has been occurring for approximately 3 weeks.  Denies any injury to the area.  BP is well controlled without  medication.  Denies headaches, chest pain, shortness of breath and dizziness.  BP Readings from Last 3 Encounters:  03/04/22 100/64  02/07/22 (!) 130/57  01/27/22 110/70     Past Medical History:  Diagnosis Date   Anxiety    Arthritis    in neck   Bursitis of right hip    Carpal tunnel syndrome, bilateral    rt wrist   Complete rotator cuff tear of left shoulder    Eagle's syndrome    GERD (gastroesophageal reflux disease)    Hearing loss of both ears 12/25/2014   Has hearing aids/tubes    Heart murmur    as child   HNP (herniated nucleus pulposus)    Ingrown toenail of right foot 2023   Interstitial cystitis    Kidney cysts     Osteopenia    Plantar fasciitis    Spinal stenosis    Tingling    rt foot   Wears hearing aid      Allergies  Allergen Reactions   Amoxicillin-Pot Clavulanate Other (See Comments)   Levofloxacin Diarrhea   Other     Band-aid   Codeine Hives   Sulfa Antibiotics Nausea And Vomiting    Current Outpatient Medications on File Prior to Visit  Medication Sig   Ascorbic Acid (VITAMIN C) 100 MG tablet Take 1,000 mg by mouth daily.   Biotin 1 MG CAPS Take 1 capsule by mouth daily.    calcium carbonate (OS-CAL - DOSED IN MG OF ELEMENTAL CALCIUM) 1250 (500 Ca) MG tablet Take 1 tablet by mouth daily with breakfast.   cholecalciferol (VITAMIN D) 1000 UNITS tablet Take 3,000 Units by mouth daily.    clindamycin (CLEOCIN) 2 % vaginal cream Place 1 Applicatorful vaginally at bedtime. Use for 7 nights. (Patient not taking: Reported on 12/17/2021)   estradiol (VIVELLE-DOT) 0.025 MG/24HR Place 1 patch onto the skin 2 (two) times a week.   GABAPENTIN PO Take 100 mg by mouth at bedtime.   hydrocortisone (ANUSOL-HC) 2.5 % rectal cream Place 1 Application rectally 2 (two) times daily.   L-Arginine 1000 MG TABS Take 1 tablet by mouth daily.   nitrofurantoin (  MACRODANTIN) 100 MG capsule Take 100 mg by mouth 2 (two) times daily.   omeprazole (PRILOSEC) 20 MG capsule Take  1 capsule  Daily  to Prevent Heartburn & Indigestion  (Dx:  k21.9  )   Probiotic Product (PROBIOTIC PO) Take 1 tablet by mouth daily.   pyridOXINE (VITAMIN B-6) 100 MG tablet Take 100 mg by mouth daily.   vitamin B-12 (CYANOCOBALAMIN) 100 MCG tablet Take 1,000 mcg by mouth daily.   doxycycline (VIBRAMYCIN) 100 MG capsule TAKE 1 CAPSULE 2 TIMES A DAY WITH FOOD FOR INFECTION (Patient not taking: Reported on 12/17/2021)   fluconazole (DIFLUCAN) 150 MG tablet Take 1 tablet by mouth once. (Patient not taking: Reported on 12/17/2021)   meloxicam (MOBIC) 15 MG tablet Take 1 tablet by mouth daily. (Patient not taking: Reported on 12/17/2021)    traMADol (ULTRAM) 50 MG tablet Take 1 tablet (50 mg total) by mouth every 6 (six) hours as needed. (Patient not taking: Reported on 03/04/2022)   No current facility-administered medications on file prior to visit.    ROS: all negative except above.   Physical Exam:  BP 100/64   Pulse 74   Temp 97.9 F (36.6 C)   Ht 4' 10.5" (1.486 m)   Wt 112 lb 6.4 oz (51 kg)   [name] 10/18/2001   SpO2 99%   BMI 23.09 kg/m   General Appearance: Well nourished, in no apparent distress. Eyes: PERRLA, EOMs, conjunctiva no swelling or erythema Sinuses: No Frontal/maxillary tenderness ENT/Mouth: Ext aud canals clear, TMs without erythema, bulging. No erythema, swelling, or exudate on post pharynx.  Tonsils not swollen or erythematous. Hearing normal.  Neck: Supple, thyroid normal.  Respiratory: Respiratory effort normal, BS equal bilaterally without rales, rhonchi, wheezing or stridor.  Cardio: RRR with no MRGs. Brisk peripheral pulses without edema.  Abdomen: Soft, + BS.  Non tender, no guarding, rebound, hernias, masses. Lymphatics: Non tender without lymphadenopathy.  Musculoskeletal: Full ROM, 5/5 strength, slight antalgic gait. Right ankle medial tenderness with pressure, no swelling, erythema, heat or bruising Skin: Warm, dry without rashes, lesions, ecchymosis.  Neuro: Cranial nerves intact. Normal muscle tone, no cerebellar symptoms. Sensation intact.  Psych: Awake and oriented X 3, normal affect, Insight and Judgment appropriate.     Helen Rossetti, NP 12:06 PM [facility]

## 2022-03-04 ENCOUNTER — Ambulatory Visit
Admission: RE | Admit: 2022-03-04 | Discharge: 2022-03-04 | Disposition: A | Discharge status: Home / Self Care | Payer: PPO | Source: Ambulatory Visit | Attending: Nurse Practitioner | Admitting: Nurse Practitioner

## 2022-03-04 ENCOUNTER — Encounter: Payer: Self-pay | Admitting: Nurse Practitioner

## 2022-03-04 ENCOUNTER — Ambulatory Visit (INDEPENDENT_AMBULATORY_CARE_PROVIDER_SITE_OTHER): Payer: PPO | Admitting: Nurse Practitioner

## 2022-03-04 VITALS — BP 100/64 | HR 74 | Temp 97.9°F | Ht 58.5 in | Wt 112.4 lb

## 2022-03-04 DIAGNOSIS — M25571 Pain in right ankle and joints of right foot: Secondary | ICD-10-CM

## 2022-03-04 DIAGNOSIS — I10 Essential (primary) hypertension: Secondary | ICD-10-CM | POA: Diagnosis not present

## 2022-03-04 NOTE — Patient Instructions (Signed)
Right ankle xray ordered at [facility] [place] In M-F 8:30-3:45   Ankle Pain The ankle joint holds your body weight and allows you to move around. Ankle pain can occur on either side or the back of one ankle or both ankles. Ankle pain may be sharp and burning or dull and aching. There may be tenderness, stiffness, redness, or warmth around the ankle. Many things can cause ankle pain, including an injury to the area and overuse of the ankle. Follow these instructions at home: Activity Rest your ankle as told by your health care provider. Avoid any activities that cause ankle pain. Do not use the injured limb to support your body weight until your health care provider says that you can. Use crutches as told by your health care provider. Do exercises as told by your health care provider. Ask your health care provider when it is safe to drive if you have a brace on your ankle. If you have a brace: Wear the brace as told by your health care provider. Remove it only as told by your health care provider. Loosen the brace if your toes tingle, become numb, or turn cold and blue. Keep the brace clean. If the brace is not waterproof: Do not let it get wet. Cover it with a watertight covering when you take a bath or shower. If you were given an elastic bandage:  Remove it when you take a bath or a shower. Try not to move your ankle very much, but wiggle your toes from time to time. This helps to prevent swelling. Adjust the bandage to make it more comfortable if it feels too tight. Loosen the bandage if you have numbness or tingling in your foot or if your foot turns cold and blue. Managing pain, stiffness, and swelling  If directed, put ice on the painful area. If you have a removable brace or elastic bandage, remove it as told by your health care provider. Put ice in a plastic bag. Place a towel between your skin and the bag. Leave the ice on for 20 minutes, 2-3  times a day. Move your toes often to avoid stiffness and to lessen swelling. Raise (elevate) your ankle above the level of your heart while you are sitting or lying down. General instructions Record information about your pain. Writing down the following may be helpful for you and your health care provider: How often you have ankle pain. Where the pain is located. What the pain feels like. If treatment involves wearing a prescribed shoe or insole, make sure you wear it correctly and for as long as told by your health care provider. Take over-the-counter and prescription medicines only as told by your health care provider. Keep all follow-up visits as told by your health care provider. This is important. Contact a health care provider if: Your pain gets worse. Your pain is not relieved with medicines. You have a fever or chills. You are having more trouble with walking. You have new symptoms. Get help right away if: Your foot, leg, toes, or ankle: Tingles or becomes numb. Becomes swollen. Turns pale or blue. Summary Ankle pain can occur on either side or the back of one ankle or both ankles. Ankle pain may be sharp and burning or dull and aching. Rest your ankle as told by your health care provider. If told, apply ice to the area. Take over-the-counter and prescription medicines only as told by your health care provider. This information is  not intended to replace advice given to you by your health care provider. Make sure you discuss any questions you have with your health care provider. Document Revised: 03/27/2020 Document Reviewed: 03/29/2020 Elsevier Patient Education  [place].

## 2022-03-06 ENCOUNTER — Encounter: Payer: Self-pay | Admitting: Nurse Practitioner

## 2022-03-21 ENCOUNTER — Ambulatory Visit (INDEPENDENT_AMBULATORY_CARE_PROVIDER_SITE_OTHER): Payer: PPO

## 2022-03-21 ENCOUNTER — Other Ambulatory Visit (HOSPITAL_COMMUNITY)
Admission: RE | Admit: 2022-03-21 | Discharge: 2022-03-21 | Disposition: A | Discharge status: Home / Self Care | Payer: PPO | Source: Ambulatory Visit | Attending: Obstetrics & Gynecology | Admitting: Obstetrics & Gynecology

## 2022-03-21 ENCOUNTER — Encounter: Payer: Self-pay | Admitting: Internal Medicine

## 2022-03-21 DIAGNOSIS — N898 Other specified noninflammatory disorders of vagina: Secondary | ICD-10-CM

## 2022-03-21 DIAGNOSIS — R3915 Urgency of urination: Secondary | ICD-10-CM

## 2022-03-21 LAB — POCT URINALYSIS DIPSTICK
Bilirubin, UA: NEGATIVE
Blood, UA: NEGATIVE
Glucose, UA: NEGATIVE
Ketones, UA: NEGATIVE
Leukocytes, UA: NEGATIVE
Nitrite, UA: NEGATIVE
Protein, UA: NEGATIVE
Spec Grav, UA: 1.015 (ref 1.010–1.025)
Urobilinogen, UA: 0.2 E.U./dL
pH, UA: 7 (ref 5.0–8.0)

## 2022-03-21 NOTE — Progress Notes (Signed)
Patient came in today to give our office a urine sample and an aptima self-swab. Patient was complaining of urinary urgency and some vaginal discharge and itching.  tbw

## 2022-03-24 LAB — CERVICOVAGINAL ANCILLARY ONLY
Bacterial Vaginitis (gardnerella): NEGATIVE
Candida Glabrata: NEGATIVE
Candida Vaginitis: NEGATIVE
Comment: NEGATIVE
Comment: NEGATIVE
Comment: NEGATIVE

## 2022-03-26 ENCOUNTER — Encounter (HOSPITAL_BASED_OUTPATIENT_CLINIC_OR_DEPARTMENT_OTHER): Payer: Self-pay | Admitting: Obstetrics & Gynecology

## 2022-03-26 LAB — URINE CULTURE

## 2022-03-27 ENCOUNTER — Other Ambulatory Visit (HOSPITAL_BASED_OUTPATIENT_CLINIC_OR_DEPARTMENT_OTHER): Payer: Self-pay | Admitting: Obstetrics & Gynecology

## 2022-03-27 DIAGNOSIS — M47816 Spondylosis without myelopathy or radiculopathy, lumbar region: Secondary | ICD-10-CM | POA: Diagnosis not present

## 2022-03-27 MED ORDER — CEPHALEXIN 250 MG PO CAPS: 500.0000 mg | ORAL_CAPSULE | Freq: Four times a day (QID) | ORAL | 0 refills | Status: DC

## 2022-03-28 ENCOUNTER — Encounter (HOSPITAL_BASED_OUTPATIENT_CLINIC_OR_DEPARTMENT_OTHER): Payer: Self-pay | Admitting: Obstetrics & Gynecology

## 2022-03-29 ENCOUNTER — Encounter (HOSPITAL_BASED_OUTPATIENT_CLINIC_OR_DEPARTMENT_OTHER): Payer: Self-pay | Admitting: Obstetrics & Gynecology

## 2022-03-29 ENCOUNTER — Other Ambulatory Visit: Payer: Self-pay | Admitting: Obstetrics and Gynecology

## 2022-03-29 ENCOUNTER — Other Ambulatory Visit (HOSPITAL_BASED_OUTPATIENT_CLINIC_OR_DEPARTMENT_OTHER): Payer: Self-pay | Admitting: Obstetrics & Gynecology

## 2022-03-29 DIAGNOSIS — N3000 Acute cystitis without hematuria: Secondary | ICD-10-CM

## 2022-03-29 MED ORDER — CEPHALEXIN 250 MG PO CAPS: 500.0000 mg | ORAL_CAPSULE | Freq: Four times a day (QID) | ORAL | 0 refills | Status: DC

## 2022-03-29 NOTE — Progress Notes (Signed)
Called and confirmed ID [phone]   Patient called regarding UTI prescription information. Was told take 546m qid but only received 20 tablets (2.5d). contacted pharmacy, can use [id number] for infection  - additional prescription sent for 20 additional tablets to complete antibiotic course.

## 2022-03-31 ENCOUNTER — Encounter: Payer: Self-pay | Admitting: Nurse Practitioner

## 2022-04-07 ENCOUNTER — Encounter: Payer: Self-pay | Admitting: *Deleted

## 2022-04-10 ENCOUNTER — Encounter (HOSPITAL_BASED_OUTPATIENT_CLINIC_OR_DEPARTMENT_OTHER): Payer: Self-pay | Admitting: Obstetrics & Gynecology

## 2022-04-10 ENCOUNTER — Ambulatory Visit (INDEPENDENT_AMBULATORY_CARE_PROVIDER_SITE_OTHER): Payer: PPO | Admitting: Obstetrics & Gynecology

## 2022-04-10 VITALS — BP 129/62 | HR 63 | Ht 58.5 in | Wt 109.8 lb

## 2022-04-10 DIAGNOSIS — D179 Benign lipomatous neoplasm, unspecified: Secondary | ICD-10-CM | POA: Diagnosis not present

## 2022-04-10 DIAGNOSIS — Z87440 Personal history of urinary (tract) infections: Secondary | ICD-10-CM

## 2022-04-10 DIAGNOSIS — R82998 Other abnormal findings in urine: Secondary | ICD-10-CM

## 2022-04-10 DIAGNOSIS — R300 Dysuria: Secondary | ICD-10-CM | POA: Diagnosis not present

## 2022-04-10 LAB — POCT URINALYSIS DIPSTICK
Bilirubin, UA: NEGATIVE
Blood, UA: NEGATIVE
Glucose, UA: NEGATIVE
Ketones, UA: NEGATIVE
Leukocytes, UA: NEGATIVE
Nitrite, UA: NEGATIVE
Protein, UA: NEGATIVE
Spec Grav, UA: 1.015 (ref 1.010–1.025)
Urobilinogen, UA: 0.2 E.U./dL
pH, UA: 7 (ref 5.0–8.0)

## 2022-04-10 NOTE — Progress Notes (Signed)
GYNECOLOGY  VISIT  CC:   follow up after cystitis  HPI: 72 y.o. G58P1102 Married White or Caucasian female here for pelvic exam and urine culture after having cystitis showing both enterococcus and proteus.  Proteus was resistant to macrobid.  Antibiotics did need to be changed.  She has finished them and now is feeling better.  Denies blood in urine.  Repeat urine culture is going to be done today.  Denies seeing blood in urine.  Last appt with Dr. Louis Meckel was in October.  Does have renal imaging due to h/o a type of cyst on kidney.  This is followed yearly.     Past Medical History:  Diagnosis Date   Anxiety    Arthritis    in neck   Bursitis of right hip    Carpal tunnel syndrome, bilateral    rt wrist   Complete rotator cuff tear of left shoulder    Eagle's syndrome    GERD (gastroesophageal reflux disease)    Hearing loss of both ears 12/25/2014   Has hearing aids/tubes    Heart murmur    as child   HNP (herniated nucleus pulposus)    Ingrown toenail of right foot 2023   Interstitial cystitis    Kidney cysts    Osteopenia    Plantar fasciitis    Spinal stenosis    Tingling    rt foot   Wears hearing aid     MEDS:   Current Outpatient Medications on File Prior to Visit  Medication Sig Dispense Refill   Ascorbic Acid (VITAMIN C) 100 MG tablet Take 1,000 mg by mouth daily.     Biotin 1 MG CAPS Take 1 capsule by mouth daily.      calcium carbonate (OS-CAL - DOSED IN MG OF ELEMENTAL CALCIUM) 1250 (500 Ca) MG tablet Take 1 tablet by mouth daily with breakfast.     cholecalciferol (VITAMIN D) 1000 UNITS tablet Take 3,000 Units by mouth daily.      estradiol (VIVELLE-DOT) 0.025 MG/24HR Place 1 patch onto the skin 2 (two) times a week. 24 patch 4   GABAPENTIN PO Take 100 mg by mouth at bedtime.     hydrocortisone (ANUSOL-HC) 2.5 % rectal cream Place 1 Application rectally 2 (two) times daily. 30 g 0   L-Arginine 1000 MG TABS Take 1 tablet by mouth daily.     omeprazole  (PRILOSEC) 20 MG capsule Take  1 capsule  Daily  to Prevent Heartburn & Indigestion  (Dx:  k21.9  ) 90 capsule 3   Probiotic Product (PROBIOTIC PO) Take 1 tablet by mouth daily.     vitamin B-12 (CYANOCOBALAMIN) 100 MCG tablet Take 1,000 mcg by mouth daily.     No current facility-administered medications on file prior to visit.    ALLERGIES: Amoxicillin-pot clavulanate, Levofloxacin, Other, Codeine, and Sulfa antibiotics  SH:  married, non smoker  Review of Systems  Constitutional: Negative.   Genitourinary:        Urinary burning    PHYSICAL EXAMINATION:    BP 129/62 (BP Location: Left Arm, Patient Position: Sitting, Cuff Size: Normal)   Pulse 63   Ht 4' 10.5" (1.486 m) Comment: Reported  Wt 109 lb 12.8 oz (49.8 kg)   [name] 10/18/2001   BMI 22.56 kg/m     General appearance: alert, cooperative and appears stated age  Lymph:  no inguinal LAD noted  Pelvic: External genitalia:  no lesions  Urethra:  normal appearing urethra with no masses, tenderness or lesions              Bartholins and Skenes: normal                 Vagina: normal appearing vagina with normal color and discharge, no lesions              Cervix: absent              Bimanual Exam:  Uterus:  uterus absent              Adnexa: no mass, fullness, tenderness             Chaperone, Octaviano Batty, CMA, was present for exam.  Assessment/Plan: 1. Leukocytes in urine  2. Dysuria - POCT Urinalysis Dipstick  3. History of cystitis - Urine Culture will be repeated today

## 2022-04-11 ENCOUNTER — Ambulatory Visit (HOSPITAL_BASED_OUTPATIENT_CLINIC_OR_DEPARTMENT_OTHER): Payer: PPO

## 2022-04-14 DIAGNOSIS — M47816 Spondylosis without myelopathy or radiculopathy, lumbar region: Secondary | ICD-10-CM | POA: Diagnosis not present

## 2022-04-15 ENCOUNTER — Encounter (HOSPITAL_BASED_OUTPATIENT_CLINIC_OR_DEPARTMENT_OTHER): Payer: Self-pay | Admitting: Obstetrics & Gynecology

## 2022-04-15 ENCOUNTER — Ambulatory Visit (HOSPITAL_BASED_OUTPATIENT_CLINIC_OR_DEPARTMENT_OTHER): Payer: PPO | Admitting: Obstetrics & Gynecology

## 2022-04-15 ENCOUNTER — Other Ambulatory Visit (HOSPITAL_BASED_OUTPATIENT_CLINIC_OR_DEPARTMENT_OTHER): Payer: Self-pay | Admitting: Obstetrics & Gynecology

## 2022-04-15 LAB — URINE CULTURE

## 2022-04-15 MED ORDER — AMOXICILLIN 500 MG PO CAPS: 500.0000 mg | ORAL_CAPSULE | Freq: Three times a day (TID) | ORAL | 0 refills | Status: AC

## 2022-04-15 NOTE — Telephone Encounter (Signed)
Pt called in response to myChart message. Advised that Rx has been sent to pharmacy for treatment of positive urine culture. Advised that with recurrent positive cultures, she should follow up with her urologist for recommendations.

## 2022-04-16 DIAGNOSIS — M65331 Trigger finger, right middle finger: Secondary | ICD-10-CM | POA: Diagnosis not present

## 2022-04-16 DIAGNOSIS — M65342 Trigger finger, left ring finger: Secondary | ICD-10-CM | POA: Diagnosis not present

## 2022-04-18 ENCOUNTER — Ambulatory Visit: Payer: PPO | Admitting: Podiatry

## 2022-04-18 ENCOUNTER — Encounter: Payer: Self-pay | Admitting: Podiatry

## 2022-04-18 DIAGNOSIS — M792 Neuralgia and neuritis, unspecified: Secondary | ICD-10-CM

## 2022-04-18 DIAGNOSIS — L603 Nail dystrophy: Secondary | ICD-10-CM

## 2022-04-18 DIAGNOSIS — M7751 Other enthesopathy of right foot: Secondary | ICD-10-CM | POA: Diagnosis not present

## 2022-04-18 NOTE — Patient Instructions (Addendum)
For instructions on how to put on your Tri-Lock Ankle Brace, please visit [email or web address]    Achilles Tendinitis  with Rehab Achilles tendinitis is a disorder of the Achilles tendon. The Achilles tendon connects the large calf muscles (Gastrocnemius and Soleus) to the heel bone (calcaneus). This tendon is sometimes called the heel cord. It is important for pushing-off and standing on your toes and is important for walking, running, or jumping. Tendinitis is often caused by overuse and repetitive microtrauma. SYMPTOMS Pain, tenderness, swelling, warmth, and redness may occur over the Achilles tendon even at rest. Pain with pushing off, or flexing or extending the ankle. Pain that is worsened after or during activity. CAUSES  Overuse sometimes seen with rapid increase in exercise programs or in sports requiring running and jumping. Poor physical conditioning (strength and flexibility or endurance). Running sports, especially training running down hills. Inadequate warm-up before practice or play or failure to stretch before participation. Injury to the tendon. PREVENTION  Warm up and stretch before practice or competition. Allow time for adequate rest and recovery between practices and competition. Keep up conditioning. Keep up ankle and leg flexibility. Improve or keep muscle strength and endurance. Improve cardiovascular fitness. Use proper technique. Use proper equipment (shoes, skates). To help prevent recurrence, taping, protective strapping, or an adhesive bandage may be recommended for several weeks after healing is complete. PROGNOSIS  Recovery may take weeks to several months to heal. Longer recovery is expected if symptoms have been prolonged. Recovery is usually quicker if the inflammation is due to a direct blow as compared with overuse or sudden strain. RELATED COMPLICATIONS  Healing time will be prolonged if the condition is not correctly treated. The injury must  be given plenty of time to heal. Symptoms can reoccur if activity is resumed too soon. Untreated, tendinitis may increase the risk of tendon rupture requiring additional time for recovery and possibly surgery. TREATMENT  The first treatment consists of rest anti-inflammatory medication, and ice to relieve the pain. Stretching and strengthening exercises after resolution of pain will likely help reduce the risk of recurrence. Referral to a physical therapist or athletic trainer for further evaluation and treatment may be helpful. A walking boot or cast may be recommended to rest the Achilles tendon. This can help break the cycle of inflammation and microtrauma. Arch supports (orthotics) may be prescribed or recommended by your caregiver as an adjunct to therapy and rest. Surgery to remove the inflamed tendon lining or degenerated tendon tissue is rarely necessary and has shown less than predictable results. MEDICATION  Nonsteroidal anti-inflammatory medications, such as aspirin and ibuprofen, may be used for pain and inflammation relief. Do not take within 7 days before surgery. Take these as directed by your caregiver. Contact your caregiver immediately if any bleeding, stomach upset, or signs of allergic reaction occur. Other minor pain relievers, such as acetaminophen, may also be used. Pain relievers may be prescribed as necessary by your caregiver. Do not take prescription pain medication for longer than 4 to 7 days. Use only as directed and only as much as you need. Cortisone injections are rarely indicated. Cortisone injections may weaken tendons and predispose to rupture. It is better to give the condition more time to heal than to use them. HEAT AND COLD Cold is used to relieve pain and reduce inflammation for acute and chronic Achilles tendinitis. Cold should be applied for 10 to 15 minutes every 2 to 3 hours for inflammation and pain and immediately after any activity that  aggravates your  symptoms. Use ice packs or an ice massage. Heat may be used before performing stretching and strengthening activities prescribed by your caregiver. Use a heat pack or a warm soak. SEEK MEDICAL CARE IF: Symptoms get worse or do not improve in 2 weeks despite treatment. New, unexplained symptoms develop. Drugs used in treatment may produce side effects.  EXERCISES:  RANGE OF MOTION (ROM) AND STRETCHING EXERCISES - Achilles Tendinitis  These exercises may help you when beginning to rehabilitate your injury. Your symptoms may resolve with or without further involvement from your physician, physical therapist or athletic trainer. While completing these exercises, remember:  Restoring tissue flexibility helps normal motion to return to the joints. This allows healthier, less painful movement and activity. An effective stretch should be held for at least 30 seconds. A stretch should never be painful. You should only feel a gentle lengthening or release in the stretched tissue.  STRETCH  Gastroc, Standing  Place hands on wall. Extend right / left leg, keeping the front knee somewhat bent. Slightly point your toes inward on your back foot. Keeping your right / left heel on the floor and your knee straight, shift your weight toward the wall, not allowing your back to arch. You should feel a gentle stretch in the right / left calf. Hold this position for 10 seconds. Repeat 3 times. Complete this stretch 2 times per day.  STRETCH  Soleus, Standing  Place hands on wall. Extend right / left leg, keeping the other knee somewhat bent. Slightly point your toes inward on your back foot. Keep your right / left heel on the floor, bend your back knee, and slightly shift your weight over the back leg so that you feel a gentle stretch deep in your back calf. Hold this position for 10 seconds. Repeat 3 times. Complete this stretch 2 times per day.  STRETCH  Gastrocsoleus, Standing  Note: This exercise can  place a lot of stress on your foot and ankle. Please complete this exercise only if specifically instructed by your caregiver.  Place the ball of your right / left foot on a step, keeping your other foot firmly on the same step. Hold on to the wall or a rail for balance. Slowly lift your other foot, allowing your body weight to press your heel down over the edge of the step. You should feel a stretch in your right / left calf. Hold this position for 10 seconds. Repeat this exercise with a slight bend in your knee. Repeat 3 times. Complete this stretch 2 times per day.   STRENGTHENING EXERCISES - Achilles Tendinitis These exercises may help you when beginning to rehabilitate your injury. They may resolve your symptoms with or without further involvement from your physician, physical therapist or athletic trainer. While completing these exercises, remember:  Muscles can gain both the endurance and the strength needed for everyday activities through controlled exercises. Complete these exercises as instructed by your physician, physical therapist or athletic trainer. Progress the resistance and repetitions only as guided. You may experience muscle soreness or fatigue, but the pain or discomfort you are trying to eliminate should never worsen during these exercises. If this pain does worsen, stop and make certain you are following the directions exactly. If the pain is still present after adjustments, discontinue the exercise until you can discuss the trouble with your clinician.  STRENGTH - Plantar-flexors  Sit with your right / left leg extended. Holding onto both ends of a rubber exercise  band/tubing, loop it around the ball of your foot. Keep a slight tension in the band. Slowly push your toes away from you, pointing them downward. Hold this position for 10 seconds. Return slowly, controlling the tension in the band/tubing. Repeat 3 times. Complete this exercise 2 times per day.   STRENGTH -  Plantar-flexors  Stand with your feet shoulder width apart. Steady yourself with a wall or table using as little support as needed. Keeping your weight evenly spread over the width of your feet, rise up on your toes.* Hold this position for 10 seconds. Repeat 3 times. Complete this exercise 2 times per day.  *If this is too easy, shift your weight toward your right / left leg until you feel challenged. Ultimately, you may be asked to do this exercise with your right / left foot only.  STRENGTH  Plantar-flexors, Eccentric  Note: This exercise can place a lot of stress on your foot and ankle. Please complete this exercise only if specifically instructed by your caregiver.  Place the balls of your feet on a step. With your hands, use only enough support from a wall or rail to keep your balance. Keep your knees straight and rise up on your toes. Slowly shift your weight entirely to your right / left toes and pick up your opposite foot. Gently and with controlled movement, lower your weight through your right / left foot so that your heel drops below the level of the step. You will feel a slight stretch in the back of your calf at the end position. Use the healthy leg to help rise up onto the balls of both feet, then lower weight only on the right / left leg again. Build up to 15 repetitions. Then progress to 3 consecutive sets of 15 repetitions.* After completing the above exercise, complete the same exercise with a slight knee bend (about 30 degrees). Again, build up to 15 repetitions. Then progress to 3 consecutive sets of 15 repetitions.* Perform this exercise 2 times per day.  *When you easily complete 3 sets of 15, your physician, physical therapist or athletic trainer may advise you to add resistance by wearing a backpack filled with additional weight.  STRENGTH - Plantar Flexors, Seated  Sit on a chair that allows your feet to rest flat on the ground. If necessary, sit at the edge of the  chair. Keeping your toes firmly on the ground, lift your right / left heel as far as you can without increasing any discomfort in your ankle. Repeat 3 times. Complete this exercise 2 times a day.

## 2022-04-18 NOTE — Progress Notes (Signed)
Subjective: Chief Complaint  Patient presents with   Foot Pain    Achilles right - aching x 3 months, had xrays recently and also a "CT"(?) back in November 2023, throbs sometimes, did tell her she had a heel spur, no treatment, dx by PCP with lymphedema and does wear compression stockings   Nail Problem    Follow up hallux nail right - still discolored   72 year old female presents the office today for concerns of right foot pain.  She points along the posterior, medial aspect of the right side where she gets discomfort.  This started 3 months ago and started aching sensation.  She is not sure if this the bone spur is causing pain.  She has a history of plantar fasciitis.  She was previously on levofloxacin but she denies any recent injury or increase swelling or bruising or rupture.  She did see her PCP for this and x-rays performed.  She has been diagnosed with lymphedema.  She does get nerve symptoms the tops of her feet which is been ongoing for some time.  Does not keep her up at night.  She also presents for follow-up on the right big toenail that is still discolored and split.  Currently denies any pain, swelling redness or any drainage.    No other concerns today.  Objective: AAO x3, NAD DP/PT pulses palpable bilaterally, CRT less than 3 seconds Chronic lower extremity edema present bilaterally. Majority tenderness today is along the area of the posterior tibial, flexor tendons on medial aspect of the ankle.  There is no significant pain along the Achilles tendon.  Flexor, extensor tendons appear to be intact.  There is no area pinpoint tenderness. Dorsal prominence present at the midfoot.  There is negative Tinel sign. The right hallux toenail is mildly dystrophic with yellow discoloration and has a split transversely across the nail.  There is no edema, erythema or signs of infection. No open lesions. No pain with calf compression, swelling, warmth,  erythema  Assessment: 72 year old female with tendinitis right ankle, neuritis, onychodystrophy  Plan: -All treatment options discussed with the patient including all alternatives, risks, complications.  -Independent reviewed the prior x-rays.  Do not think the bone spurs was causing her pain. -She was interested in a steroid so would not hold off on any further steroids.  Dispensed a Tri-Lock ankle brace for soft tissue mobilization for the right ankle.  Discussed traction, icing on regular basis.  Discussed shoe modifications and continue with good arch support. -Discussed offloading the dorsal prominence with releasing her shoes to avoid excess pressure to help with the nerve.  Compression socks could also be causing some nerve compression. -Discussed the nail hardware to help with the split nail on the right side.  Return if symptoms worsen or fail to improve.  Trula Slade DPM

## 2022-04-20 ENCOUNTER — Encounter: Payer: Self-pay | Admitting: Podiatry

## 2022-04-22 DIAGNOSIS — N3281 Overactive bladder: Secondary | ICD-10-CM | POA: Diagnosis not present

## 2022-04-22 DIAGNOSIS — N3010 Interstitial cystitis (chronic) without hematuria: Secondary | ICD-10-CM | POA: Diagnosis not present

## 2022-04-22 DIAGNOSIS — N3020 Other chronic cystitis without hematuria: Secondary | ICD-10-CM | POA: Diagnosis not present

## 2022-04-24 ENCOUNTER — Telehealth: Payer: Self-pay | Admitting: Podiatry

## 2022-04-24 DIAGNOSIS — M47816 Spondylosis without myelopathy or radiculopathy, lumbar region: Secondary | ICD-10-CM | POA: Diagnosis not present

## 2022-04-24 NOTE — Telephone Encounter (Signed)
[employer] is calling back to speak with Helen Glover. States that a request was send over for a retro request but the claim has already been submitted so they can start an authorization.  Direct number is [phone]

## 2022-04-25 ENCOUNTER — Telehealth: Payer: Self-pay | Admitting: Podiatry

## 2022-04-25 NOTE — Telephone Encounter (Signed)
error 

## 2022-04-27 ENCOUNTER — Encounter: Payer: Self-pay | Admitting: Internal Medicine

## 2022-04-30 ENCOUNTER — Telehealth: Payer: Self-pay

## 2022-04-30 NOTE — Telephone Encounter (Signed)
Gerald Stabs from [facility] called about a prior auth for a brace [id number], from her visit on March 1st, Claim # [id number].

## 2022-05-05 DIAGNOSIS — M5416 Radiculopathy, lumbar region: Secondary | ICD-10-CM | POA: Diagnosis not present

## 2022-05-05 NOTE — Telephone Encounter (Signed)
Prior authorization has already been approved

## 2022-05-12 ENCOUNTER — Encounter (HOSPITAL_BASED_OUTPATIENT_CLINIC_OR_DEPARTMENT_OTHER): Payer: Self-pay | Admitting: Obstetrics & Gynecology

## 2022-05-12 ENCOUNTER — Encounter: Payer: Self-pay | Admitting: Internal Medicine

## 2022-05-12 ENCOUNTER — Ambulatory Visit (INDEPENDENT_AMBULATORY_CARE_PROVIDER_SITE_OTHER): Payer: PPO | Admitting: Internal Medicine

## 2022-05-12 VITALS — BP 130/70 | HR 97 | Temp 97.9°F | Resp 17 | Ht 58.5 in | Wt 109.6 lb

## 2022-05-12 DIAGNOSIS — G608 Other hereditary and idiopathic neuropathies: Secondary | ICD-10-CM | POA: Diagnosis not present

## 2022-05-12 DIAGNOSIS — J0141 Acute recurrent pansinusitis: Secondary | ICD-10-CM

## 2022-05-12 MED ORDER — AZITHROMYCIN 250 MG PO TABS: ORAL_TABLET | ORAL | 1 refills | Status: DC

## 2022-05-12 NOTE — Progress Notes (Addendum)
Future Appointments  Date Time Provider Department  05/12/2022  3:30 PM Unk Pinto, MD [facility]  07/21/2022 11:30 AM [name], NP [facility]  10/14/2022  9:30 AM Unk Pinto, MD [facility]  11/25/2022  9:00 AM [name], NP [facility]    History of Present Illness:     Patient is a very nice 72 yo MWF with labile HTN, HLD, PreDM, Vit D Def who has hx/o back surgery with LE radiculopathy presents with c/o 4 month hx/o asymtreic  (Rt> Lt) tingling to stabbing pains of the LE from mid shin down. Denies k/o heavy metal exposures.      Today she also c/o 3 to  4 day hx/o sinus pressure &  congestion and thick yellowish post nasal drainage .Denies chest congestion.     Current Outpatient Medications on File Prior to Visit  Medication Sig   VITAMIN C 100 MG tablet Take 1,000 mg  daily.   Biotin 1 MG CAPS Take 1 capsule  daily.    OS-CAL - 1250 (500 Ca) MG tablet Take 1 tablet daily    VITAMIN D 1000 u Take 3,000 Units  daily.    VIVELLE-DOT 0.025 MG/24HR Place 1 on skin 2 times a week.   GABAPENTIN 100 mg  Take at bedtime.   ANUSOL-HC 2.5 % rectal cream Place  rectally 2  times daily.   L-Arginine 1000 MG  Take 1 tablet  daily.   omeprazole  20 MG capsule Take  1 capsule  Daily   PROBIOTIC Take 1 tablet daily.   vitamin B-12  1000 MCG tablet Take  daily.     Allergies  Allergen Reactions   Amoxicillin-Pot Clavulanate Other (See Comments)   Levofloxacin Diarrhea   Other     Band-aid   Codeine Hives   Sulfa Antibiotics Nausea And Vomiting     Problem list She has Interstitial cystitis; GERD (gastroesophageal reflux disease); Hyperlipidemia; Hypertension; History of prediabetes; Vitamin D deficiency; Aortic atherosclerosis ([place]); Lumbar back pain with radiculopathy affecting right lower extremity; Osteopenia; and Hemorrhoids on their problem list.   Observations/Objective:  BP 130/70   Pulse 97   Temp 97.9 F (36.6 C)   Resp 17   Ht 4' 10.5"  (1.486 m)   Wt 109 lb 9.6 oz (49.7 kg)   [name] 10/18/2001   SpO2 96%   BMI 22.52 kg/m   HEENT -  Rt  TM sl retracted & sl pink.  Lt TM with a large central perforation & moderate erythema of the drum (+) tender of bilat frontal & maxillary sinuses. N/O/P clear.   Neck - supple.  Chest - Clear equal BS. Cor - Nl HS. RRR w/o sig MGR. PP 1(+). No edema. MS- FROM w/o deformities.  Gait Nl. Neuro -  Nl w/o focal abnormalities. DTR's LE 2+.                Sensation intact to touch and Monofilament to the toes bilaterally,                but  Vibratory decreased from ankles to toes .   Assessment and Plan:   1. Peripheral sensory neuropathy  - Vitamin B12 - Vitamin B1, whole blood - pending lab results with refer for neuro consultation for PNCV'   2. Acute recurrent pansinusitis  - azithromycin (ZITHROMAX) 250 MG tablet     Take 2 tablets with Food on  Day 1, then 1 tablet Daily  Dispense: 6 each; Refill: 1   Follow Up Instructions:        I discussed the assessment and treatment plan with the patient. The patient was provided an opportunity to ask questions and all were answered. The patient agreed with the plan and demonstrated an understanding of the instructions.       The patient was advised to call back or seek an in-person evaluation if the symptoms worsen or if the condition fails to improve as anticipated.    Kirtland Bouchard, MD

## 2022-05-13 ENCOUNTER — Encounter: Payer: Self-pay | Admitting: Internal Medicine

## 2022-05-13 NOTE — Progress Notes (Signed)
<><><><><><><><><><><><><><><><><><><><><><><><><><><><><><><><><> <><><><><><><><><><><><><><><><><><><><><><><><><><><><><><><><><>  -   Vitamin B12 is slightly elevated and  excess or elevated B12 has no Known associated toxicities,  but OK to decrease dose to take just 3 x / week  <><><><><><><><><><><><><><><><><><><><><><><><><><><><><><><><><> <><><><><><><><><><><><><><><><><><><><><><><><><><><><><><><><><>

## 2022-05-13 NOTE — Progress Notes (Signed)
<><><><><><><><><><><><><><><><><><><><><><><><><><><><><><><><><> <><><><><><><><><><><><><><><><><><><><><><><><><><><><><><><><><>  -   Vitamin B12 is slightly elevated and   excess or elevated B12 has no Known associated toxicities,   but OK to decrease dose to take just 3 x / week   <><><><><><><><><><><><><><><><><><><><><><><><><><><><><><><><><> <><><><><><><><><><><><><><><><><><><><><><><><><><><><><><><><><>

## 2022-05-16 LAB — VITAMIN B12: Vitamin B-12: 1194 pg/mL — ABNORMAL HIGH (ref 200–1100)

## 2022-05-16 LAB — VITAMIN B1, WHOLE BLOOD: Vitamin B1 (Thiamine), Blood: 120 nmol/L (ref 78–185)

## 2022-05-19 ENCOUNTER — Encounter: Payer: Self-pay | Admitting: Internal Medicine

## 2022-05-19 NOTE — Addendum Note (Signed)
Addended by: Unk Pinto on: 05/19/2022 02:09 PM   Modules accepted: Orders

## 2022-05-19 NOTE — Progress Notes (Signed)
Thiamine  ( B1 returned Normal, Also as was Vit B12 Nl ), So  As discusssed, Neurology consultation requested for peripheral Neuropathy.

## 2022-05-23 NOTE — Telephone Encounter (Signed)
Helen Glover from [facility] said that if the patient gets the [id number] a new prior Berkley Harvey will need to be put in before we dispense the brace. They are going to cancel the one they have for now. Spoke with Dr Ardelle Anton and he said that patient has the L 1902 brace .

## 2022-06-02 DIAGNOSIS — M5416 Radiculopathy, lumbar region: Secondary | ICD-10-CM | POA: Diagnosis not present

## 2022-06-10 DIAGNOSIS — R0982 Postnasal drip: Secondary | ICD-10-CM | POA: Diagnosis not present

## 2022-06-10 DIAGNOSIS — H903 Sensorineural hearing loss, bilateral: Secondary | ICD-10-CM | POA: Diagnosis not present

## 2022-06-10 DIAGNOSIS — J310 Chronic rhinitis: Secondary | ICD-10-CM | POA: Diagnosis not present

## 2022-06-10 DIAGNOSIS — H7202 Central perforation of tympanic membrane, left ear: Secondary | ICD-10-CM | POA: Diagnosis not present

## 2022-07-07 NOTE — Progress Notes (Unsigned)
[facility]  PATIENT: Helen Glover DOB: 02-23-1950  REFERRING DOCTOR OR PCP: Lucky Cowboy, MD SOURCE: Patient, notes from primary care, imaging and lab reports, MRI images personally reviewed.  _________________________________   HISTORICAL  CHIEF COMPLAINT:  Chief Complaint  Patient presents with   Room 10    Pt is here Alone. Pt states that her symptoms started 3-4 years ago. Pt states that in October she started to feel the pins and needles feeing in her feet and her legs. Pt states that she has some burning her feet and legs Pt states that she had C1 C2 Fusions 4 years ago and Sunday she had some itching.  Pt states that her pain started after she started wearing compression socks,    HISTORY OF PRESENT ILLNESS:  I had the pleasure to see your patient, Helen Glover, at [facility] for neurologic consultation regarding her numbness and burning.  She is a 72 year old woman who reports tingling and pian in the right foot for several years, felt to be related to surgery at L4L5 (Dr. Shelle Iron) and L5S1 (Dr. Yetta Barre).      The L4L5 decompression was first and she had had left > right leg pain leading to 2 operations at that level.   She had riht sciatica that was very painful in 2020 and had L5S1 decompression.  She felt weaker initially after the second operation but improved over time.     She had C1-C2 decompression in 2020 (Dr. Yetta Barre) due to spinal stenosis      Since October 2023 she has had more right leg tingling.  Since last month, she has also had more tingling in the left leg - not noted until she started to wear compression socks.    She notes mild right leg weakness.    She notes some numbness and tingling in her hands.   She has CTS and had surgery on the left in the past.   She wears a brace on the right.    She has itching sensation without redness on shoulders (had right before C1-C2 stenosis discovered)  She has  trigeminal  neuralgia.   She was on 100 mg gabapentin at night (helped sleep) but she was groggy in the morning.  She was prescribed low dose Lyrica instead but has not taken it as she is sleeping better Imaging:  She has some urinary urgency but has been diagnosed with IC.  She has no incontinence.  She has 1 x nocturia.      She used to drink heavily a couple times a week but no longer does so.     DATA IMAGING MRI of the lumbar spine 06/12/2020 showed Mild spinal stenosis at L1-L2 and L2-L3 and mild to moderate spinal stenosis at L3-L4 and L4-L5.  There is severe bilateral foraminal narrowing at L4-L5.  There has been prior decompression at L4-L5 and L5-S1.  There is enhancing scar posteriorly and in the right lateral recess at L4-L5 and posteriorly and at the lateral recesses at L5-S1  MRI of the cervical spine 10/13/2017 showed spinal stenosis at C2 due to degenerative pannus.  There is mild degenerative changes at C3-C4 through C6-C7 but no significant spinal stenosis or nerve root compression.  Labs: B12 ok, thiamine is normal in 2023,   She had a NCV/EMG with Dr. Murray Hodgkins in 2022 (legs) but results are not available.  REVIEW OF SYSTEMS: Constitutional: No fevers, chills, sweats, or change in appetite Eyes: No  visual changes, double vision, eye pain Ear, nose and throat: No ear pain, nasal congestion, sore throat.  Note some hearing loss Cardiovascular: No chest pain, palpitations Respiratory:  No shortness of breath at rest or with exertion.   No wheezes GastrointestinaI: No nausea, vomiting, diarrhea, abdominal pain, fecal incontinence Genitourinary:  No dysuria, urinary retention or frequency.  No nocturia. Musculoskeletal: Spine issues as above  integumentary: No rash, pruritus, skin lesions Neurological: as above Psychiatric: No depression at this time.  No anxiety Endocrine: No palpitations, diaphoresis, change in appetite, change in weigh or increased thirst Hematologic/Lymphatic:  No  anemia, purpura, petechiae. Allergic/Immunologic: No itchy/runny eyes, nasal congestion, recent allergic reactions, rashes  ALLERGIES: Allergies  Allergen Reactions   Amoxicillin-Pot Clavulanate Other (See Comments)   Levofloxacin Diarrhea   Other     Band-aid   Codeine Hives   Sulfa Antibiotics Nausea And Vomiting    HOME MEDICATIONS:  Current Outpatient Medications:    Ascorbic Acid (VITAMIN C) 100 MG tablet, Take 1,000 mg by mouth daily., Disp: , Rfl:    Biotin 1 MG CAPS, Take 1 capsule by mouth daily. , Disp: , Rfl:    calcium carbonate (OS-CAL - DOSED IN MG OF ELEMENTAL CALCIUM) 1250 (500 Ca) MG tablet, Take 1 tablet by mouth daily with breakfast., Disp: , Rfl:    cholecalciferol (VITAMIN D) 1000 UNITS tablet, Take 3,000 Units by mouth daily. , Disp: , Rfl:    estradiol (VIVELLE-DOT) 0.025 MG/24HR, Place 1 patch onto the skin 2 (two) times a week., Disp: 24 patch, Rfl: 4   hydrocortisone (ANUSOL-HC) 2.5 % rectal cream, Place 1 Application rectally 2 (two) times daily., Disp: 30 g, Rfl: 0   L-Arginine 1000 MG TABS, Take 1 tablet by mouth daily., Disp: , Rfl:    omeprazole (PRILOSEC) 20 MG capsule, Take  1 capsule  Daily  to Prevent Heartburn & Indigestion  (Dx:  k21.9  ), Disp: 90 capsule, Rfl: 3   Probiotic Product (PROBIOTIC PO), Take 1 tablet by mouth daily., Disp: , Rfl:    azithromycin (ZITHROMAX) 250 MG tablet, Take 2 tablets with Food on  Day 1, then 1 tablet Daily with Food for Sinusitis / Bronchitis, Disp: 6 each, Rfl: 1   GABAPENTIN PO, Take 100 mg by mouth at bedtime., Disp: , Rfl:    vitamin B-12 (CYANOCOBALAMIN) 100 MCG tablet, Take 1,000 mcg by mouth daily. (Patient not taking: Reported on 07/08/2022), Disp: , Rfl:   PAST MEDICAL HISTORY: Past Medical History:  Diagnosis Date   Anxiety    Arthritis    in neck   Bursitis of right hip    Carpal tunnel syndrome, bilateral    rt wrist   Complete rotator cuff tear of left shoulder    Eagle's syndrome    GERD  (gastroesophageal reflux disease)    Hearing loss of both ears 12/25/2014   Has hearing aids/tubes    Heart murmur    as child   HNP (herniated nucleus pulposus)    Ingrown toenail of right foot 2023   Interstitial cystitis    Kidney cysts    followed by Dr. Marlou Porch   Osteopenia    Plantar fasciitis    Spinal stenosis    Tingling    rt foot   Wears hearing aid     PAST SURGICAL HISTORY: Past Surgical History:  Procedure Laterality Date   ABDOMINAL HYSTERECTOMY  2003   BSO   ANAL SPHINCTEROTOMY     CARPAL TUNNEL RELEASE  LEFT   CERVICAL FUSION  02/25/2018   c1-c2 with laminectomy   CESAREAN SECTION     CYSTOSCOPY     HEMORRHOID SURGERY N/A 1998   KNEE ARTHROSCOPY Right 06/22/2019   Dr. Ranell Patrick    LUMBAR LAMINECTOMY     LUMBAR LAMINECTOMY/DECOMPRESSION MICRODISCECTOMY N/A 08/09/2014   Procedure: MICRO LUMBAR DECOMPRESSION L3-4,  REDO DECOMPRESSION, MICRODISCECTOMY L4-5, FORAMINOTOMY L3-L4, L4-L5 BILATERAL;  Surgeon: Jene Every, MD;  Location: [facility];  Service: Orthopedics;  Laterality: N/A;   MYRINGOTOMY     NASAL SINUS SURGERY     TONSILLECTOMY     LEFT only, due to Eagle Syndrome   TONSILLECTOMY  10/14   right side due to Eagles syndrome   TRIGGER FINGER RELEASE     RIGHT   TYMPANOSTOMY TUBE PLACEMENT      FAMILY HISTORY: Family History  Problem Relation Age of Onset   Diabetes Mother    Hypertension Mother    Hyperlipidemia Mother    Osteoporosis Mother    Hypertension Brother    Hyperlipidemia Brother    Prostate cancer Father        Bone    SOCIAL HISTORY: Social History   Socioeconomic History   Marital status: Married    Spouse name: Tinnie Gens   Number of children: 2   Years of education: Not on file   Highest education level: Not on file  Occupational History   Occupation: OWNER    Employer: [employer]    Comment: Research officer, political party  Tobacco Use   Smoking status: Former    Packs/day: 0.50    Years: 15.00    Additional pack  years: 0.00    Total pack years: 7.50    Types: Cigarettes    Quit date: 12/04/1986    Years since quitting: 35.6   Smokeless tobacco: Never  Vaping Use   Vaping Use: Never used  Substance and Sexual Activity   Alcohol use: Yes    Alcohol/week: 6.0 - 7.0 standard drinks of alcohol    Types: 6 - 7 Standard drinks or equivalent per week   Drug use: No   Sexual activity: Not Currently    Partners: Male    Birth control/protection: Surgical    Comment: TAH/BSO  Other Topics Concern   Not on file  Social History Narrative   Right Handed   1 Cup of Hot Tea per Day   1 Coffee per Day   Social Determinants of Health   Financial Resource Strain: Not on file  Food Insecurity: No Food Insecurity (02/25/2021)   Hunger Vital Sign    Worried About Running Out of Food in the Last Year: Never true    Ran Out of Food in the Last Year: Never true  Transportation Needs: No Transportation Needs (02/25/2021)   PRAPARE - Administrator, Civil Service (Medical): No    Lack of Transportation (Non-Medical): No  Physical Activity: Not on file  Stress: Not on file  Social Connections: Not on file  Intimate Partner Violence: Not on file       PHYSICAL EXAM  Vitals:   07/08/22 1026  BP: 138/75  Pulse: 67  Weight: 107 lb 8 oz (48.8 kg)  Height: 4\' 10"  (1.473 m)    Body mass index is 22.47 kg/m.   General: The patient is well-developed and well-nourished and in no acute distress  HEENT:  Head is [place]/AT.  Sclera are anicteric.    Neck: No carotid bruits are noted.  The neck is nontender.  Cardiovascular: The heart has a regular rate and rhythm with a normal S1 and S2. There were no murmurs, gallops or rubs.    Skin: Extremities are without rash or  edema.  Musculoskeletal:  Back is nontender  Neurologic Exam  Mental status: The patient is alert and oriented x 3 at the time of the examination. The patient has apparent normal recent and remote memory, with an apparently  normal attention span and concentration ability.   Speech is normal.  Cranial nerves: Extraocular movements are full.  Facial strength and sensation was normal.  No dysarthria.  Motor:  Muscle bulk is normal.   Tone is normal. Strength is  5 / 5 in all 4 extremities.   Sensory: Sensory testing is intact to pinprick, soft touch and vibration sensation in all 4 extremities.  She had very slight reduced pp on right foot (L5 and S1) compared to left but vibration in dermatomes was equal.   She has altered sensation at the occiput (since C1-C2 surgery)  Coordination: Cerebellar testing reveals good finger-nose-finger and heel-to-shin bilaterally.  Gait and station: Station is normal.   Gait is normal. Tandem gait is normal for age. Romberg is negative.   Reflexes: Deep tendon reflexes are symmetric and normal bilaterally.   Plantar responses are flexor.    DIAGNOSTIC DATA (LABS, IMAGING, TESTING) - I reviewed patient records, labs, notes, testing and imaging myself where available.  Lab Results  Component Value Date   WBC 5.4 02/07/2022   HGB 12.7 02/07/2022   HCT 37.6 02/07/2022   MCV 95.9 02/07/2022   PLT 232 02/07/2022      Component Value Date/Time   NA 141 02/07/2022 0552   K 3.5 02/07/2022 0552   CL 106 02/07/2022 0552   CO2 28 02/07/2022 0552   GLUCOSE 107 (H) 02/07/2022 0552   BUN 15 02/07/2022 0552   CREATININE 0.60 02/07/2022 0552   CREATININE 0.69 11/21/2021 0000   CALCIUM 9.1 02/07/2022 0552   PROT 6.5 02/07/2022 0552   ALBUMIN 4.2 02/07/2022 0552   AST 19 02/07/2022 0552   ALT 15 02/07/2022 0552   ALKPHOS 53 02/07/2022 0552   BILITOT 0.3 02/07/2022 0552   GFRNONAA >60 02/07/2022 0552   GFRNONAA 95 07/10/2020 1147   GFRAA 110 07/10/2020 1147   Lab Results  Component Value Date   CHOL 223 (H) 11/21/2021   HDL 87 11/21/2021   LDLCALC 116 (H) 11/21/2021   TRIG 97 11/21/2021   CHOLHDL 2.6 11/21/2021   Lab Results  Component Value Date   HGBA1C 5.4  11/21/2021   Lab Results  Component Value Date   VITAMINB12 1,194 (H) 05/12/2022   Lab Results  Component Value Date   TSH 1.34 11/21/2021       ASSESSMENT AND PLAN  Polyneuropathy - Plan: Multiple Myeloma Panel (SPEP&IFE w/QIG), Sjogren's syndrome antibods(ssa + ssb), Rheumatoid factor  Numbness - Plan: Multiple Myeloma Panel (SPEP&IFE w/QIG), Sjogren's syndrome antibods(ssa + ssb), Rheumatoid factor  History of fusion of cervical spine  Status post lumbar surgery   In summary, Ms. Morvay is a 72 year old woman with pain and numbness in her legs.  She has a complicated history with multiple lumbar and cervical procedures.  It is certainly possible that the symptoms of the legs are related to her known foraminal narrowing and epidural fibrosis L4-L5 and L5-S1.  Alternatively, she might have a mild polyneuropathy.  I do not think that her symptoms are related to her cervical  spine as she has no long tract signs or change in gait or bladder.  We will check some blood work for polyneuropathies (SPEP/IEF and SSA/SSB) B12 was normal last year.  If symptoms progressively worsen consider spine imaging.  She will consider taking the pregabalin.  She had trouble tolerating gabapentin.  We discussed that another medication (lamotrigine) might be of benefit if symptoms worsen.  I will see her back as needed based on the results of the studies and if new or worsening neurologic symptoms.  Thank you for asking me to see Ms. Mittleman.  Please let me know if I can be of further assistance with her or other patients in the future.  Swill     Keali Mccraw A. Epimenio Foot, MD, [facility] 07/08/2022, 12:12 PM Certified in Neurology, Clinical Neurophysiology, Sleep Medicine and Neuroimaging  [facility] [address] [place], [place] [address] [phone]

## 2022-07-08 ENCOUNTER — Encounter: Payer: Self-pay | Admitting: Neurology

## 2022-07-08 ENCOUNTER — Ambulatory Visit: Payer: PPO | Admitting: Neurology

## 2022-07-08 VITALS — BP 138/75 | HR 67 | Ht <= 58 in | Wt 107.5 lb

## 2022-07-08 DIAGNOSIS — Z981 Arthrodesis status: Secondary | ICD-10-CM

## 2022-07-08 DIAGNOSIS — Z9889 Other specified postprocedural states: Secondary | ICD-10-CM

## 2022-07-08 DIAGNOSIS — G629 Polyneuropathy, unspecified: Secondary | ICD-10-CM | POA: Diagnosis not present

## 2022-07-08 DIAGNOSIS — R200 Anesthesia of skin: Secondary | ICD-10-CM

## 2022-07-10 ENCOUNTER — Ambulatory Visit: Payer: PPO | Admitting: Nurse Practitioner

## 2022-07-11 LAB — MULTIPLE MYELOMA PANEL, SERUM
Albumin SerPl Elph-Mcnc: 3.9 g/dL (ref 2.9–4.4)
Albumin/Glob SerPl: 1.5 (ref 0.7–1.7)
Alpha 1: 0.2 g/dL (ref 0.0–0.4)
Alpha2 Glob SerPl Elph-Mcnc: 0.7 g/dL (ref 0.4–1.0)
B-Globulin SerPl Elph-Mcnc: 0.9 g/dL (ref 0.7–1.3)
Gamma Glob SerPl Elph-Mcnc: 0.8 g/dL (ref 0.4–1.8)
Globulin, Total: 2.7 g/dL (ref 2.2–3.9)
IgA/Immunoglobulin A, Serum: 194 mg/dL (ref 64–422)
IgG (Immunoglobin G), Serum: 782 mg/dL (ref 586–1602)
IgM (Immunoglobulin M), Srm: 103 mg/dL (ref 26–217)
Total Protein: 6.6 g/dL (ref 6.0–8.5)

## 2022-07-11 LAB — RHEUMATOID FACTOR: Rheumatoid fact SerPl-aCnc: <10 IU/mL (ref <14.0)

## 2022-07-11 LAB — SJOGREN'S SYNDROME ANTIBODS(SSA + SSB)
ENA SSA (RO) Ab: <0.2 AI (ref 0.0–0.9)
ENA SSB (LA) Ab: <0.2 AI (ref 0.0–0.9)

## 2022-07-21 ENCOUNTER — Ambulatory Visit: Payer: PPO | Admitting: Nurse Practitioner

## 2022-07-28 DIAGNOSIS — Z86010 Personal history of colonic polyps: Secondary | ICD-10-CM | POA: Diagnosis not present

## 2022-07-28 DIAGNOSIS — R140 Abdominal distension (gaseous): Secondary | ICD-10-CM | POA: Diagnosis not present

## 2022-07-28 DIAGNOSIS — R197 Diarrhea, unspecified: Secondary | ICD-10-CM | POA: Diagnosis not present

## 2022-07-28 DIAGNOSIS — K219 Gastro-esophageal reflux disease without esophagitis: Secondary | ICD-10-CM | POA: Diagnosis not present

## 2022-07-28 DIAGNOSIS — K649 Unspecified hemorrhoids: Secondary | ICD-10-CM | POA: Diagnosis not present

## 2022-07-31 ENCOUNTER — Ambulatory Visit: Payer: PPO | Admitting: Diagnostic Neuroimaging

## 2022-08-04 ENCOUNTER — Ambulatory Visit: Payer: PPO | Admitting: Nurse Practitioner

## 2022-08-07 ENCOUNTER — Ambulatory Visit: Payer: PPO | Admitting: Nurse Practitioner

## 2022-08-13 DIAGNOSIS — M65331 Trigger finger, right middle finger: Secondary | ICD-10-CM | POA: Diagnosis not present

## 2022-08-13 DIAGNOSIS — M65342 Trigger finger, left ring finger: Secondary | ICD-10-CM | POA: Diagnosis not present

## 2022-08-19 ENCOUNTER — Other Ambulatory Visit: Payer: Self-pay | Admitting: Internal Medicine

## 2022-08-19 ENCOUNTER — Encounter: Payer: Self-pay | Admitting: Nurse Practitioner

## 2022-08-19 DIAGNOSIS — Z6822 Body mass index (BMI) 22.0-22.9, adult: Secondary | ICD-10-CM | POA: Diagnosis not present

## 2022-08-19 DIAGNOSIS — L03031 Cellulitis of right toe: Secondary | ICD-10-CM | POA: Diagnosis not present

## 2022-08-19 MED ORDER — MELOXICAM 15 MG PO TABS: ORAL_TABLET | ORAL | 3 refills | Status: DC

## 2022-08-25 DIAGNOSIS — M5416 Radiculopathy, lumbar region: Secondary | ICD-10-CM | POA: Diagnosis not present

## 2022-08-27 ENCOUNTER — Encounter (HOSPITAL_BASED_OUTPATIENT_CLINIC_OR_DEPARTMENT_OTHER): Payer: Self-pay | Admitting: Obstetrics & Gynecology

## 2022-08-27 NOTE — Progress Notes (Unsigned)
MEDICARE ANNUAL WELLNESS VISIT AND FOLLOW UP Assessment:   Annual Medicare Wellness Visit Due annually  Colonoscopy 2020 due 2025 Mammogram scheduled 09/2022  Aortic atherosclerosis (HCC) Control blood pressure, cholesterol, glucose, increase exercise.   Essential hypertension Recently controlled off of medications Monitor blood pressure  Continue DASH diet.   Reminder to go to the ER if any CP, SOB, nausea, dizziness, severe HA, changes vision/speech, left arm numbness and tingling and jaw pain. - CBC, CMP  Gastroesophageal reflux disease, esophagitis presence not specified Taking omeprazole in AM and reports well controlled  Discussed diet, avoiding triggers and other lifestyle changes  Abnormal Glucose Continue diet and exercise - CMP  Hyperlipidemia, mixed Mild elevations trying lifestyle changes Discussed dietary and exercise modifications - Lipid panel - TSH  Medication management CMP   Interstitial cystitis  managing with lifestyle, follows urology  Alcohol use  Has reduced intake, avoiding daily intake  Working with therapist No history of DT's/withdrawals  Medication management Magnesium  Vitamin D deficiency Last VIT at goal Please take your thyroid medication greater than 30 min before breakfast, separated by at least 4 hours  from antacids, calcium, iron, and multivitamins.   Osteopenia Last DEXA 2022 T score - 1.6 Continue Vit D supplementation and weight bearing exercises  Ingrown toenail Finish antibiotic prescribed by Urgent care Do warm epsom salt soaks TID If symptoms do not resolve notify the office  I provided 40 minutes of non-face-to-face time during this encounter including counseling, chart review, and critical decision making was preformed.   Future Appointments  Date Time Provider Department Center  11/25/2022  9:00 AM Raynelle Dick, NP [facility] None  01/13/2023 11:30 AM Sater, Pearletha Furl, MD [facility] None  08/28/2023  10:30 AM Raynelle Dick, NP [facility] None      Plan:   During the course of the visit the patient was educated and counseled about appropriate screening and preventive services including:   Pneumococcal vaccine  Influenza vaccine Prevnar 13 Td vaccine Screening electrocardiogram, deferred, telephone visit COVID19. Colorectal cancer screening Diabetes screening Glaucoma screening Nutrition counseling    Subjective:  Helen Glover is a 72 y.o. female who presents for Medicare Annual Wellness Visit and follow up for HTN, hyperlipidemia, prediabetes, GERD and vitamin D Def.   She does have a therapist that she is following with benefit. She was drinking a lot, variable, husband also drinks a lot.  She is now separated from her husband, she has downsized into a condo. She is now drinking very little alcohol.   02/25/18 patient had C1-C2 posterior laminectomy and fusion by Dr Marcelle Overlie, doing well since. She has spinal stenosis with radiculopathy followed by Dr. Murray Hodgkins, had surgery by Dr. Yetta Barre 06/18/2020, also has been getting injections with immense benefit, doing gabapentin 100 mg at night. Has residual RLE numbness/tingling, has NCV planned. She is following with Dr. Epimenio Foot for polyneuropathy- last visit 07/08/22- considering possible implanted stimulator for back pain  Follows with Dr. Ranell Patrick for knee, recently had R knee arthroscopy 06/22/2019, only follows as needed.  Has trigger finger on each side for which she gets injections.   She was evaluated last week for infected ingrown toenail of 3rd left toe- Was evaluated at Urgent Care and treated with doxycycline  She is taking omeprazole 20mg  in the morning.  She is also taking a probiotic tablet daily.  She had hysterectomy and continues on low dose estradiol patch, follows with GYN.   Interstitial cystitis - patient states mild now that  lower stress since retired, managing with lifestyle only.   BMI is Body mass index is 21.69  kg/m., she has been working on diet, exercises limited by ortho conditions, does upper body weights and walks as able. She is eating regular meals but smaller meals.  Wt Readings from Last 3 Encounters:  08/28/22 103 lb 12.8 oz (47.1 kg)  07/08/22 107 lb 8 oz (48.8 kg)  05/12/22 109 lb 9.6 oz (49.7 kg)   She does not check BP at home, not on any meds, today their BP is BP: 104/60  BP Readings from Last 3 Encounters:  08/28/22 104/60  07/08/22 138/75  05/12/22 130/70  She does workout. She denies chest pain, shortness of breath, dizziness.  She has aortic atherosclerosis per CT 07/2018.   She is not on cholesterol medication and denies myalgias. Her cholesterol is not at goal. No family hx of MI/CVA. The cholesterol last visit was:   Lab Results  Component Value Date   CHOL 223 (H) 11/21/2021   HDL 87 11/21/2021   LDLCALC 116 (H) 11/21/2021   TRIG 97 11/21/2021   CHOLHDL 2.6 11/21/2021   She has been working on diet and exercise for hx of prediabetes (A1C 5.7% in 2014), and denies hyperglycemia, hypoglycemia , increased appetite, nausea, paresthesia of the feet, polydipsia, polyuria, visual disturbances, vomiting and weight loss. Last A1C in the office was:  Lab Results  Component Value Date   HGBA1C 5.4 11/21/2021   Last GFR Lab Results  Component Value Date   GFRNONAA >60 02/07/2022   Patient is on Vitamin D supplement, taking ? 3000 IU daily.   Lab Results  Component Value Date   VD25OH 66 11/21/2021      Medication Review:  Current Outpatient Medications (Endocrine & Metabolic):    estradiol (VIVELLE-DOT) 0.025 MG/24HR, Place 1 patch onto the skin 2 (two) times a week.    Current Outpatient Medications (Analgesics):    meloxicam (MOBIC) 15 MG tablet, Take  1/2 to 1 tablet  Daily  with Food  for Pain & Inflammation  Current Outpatient Medications (Hematological):    vitamin B-12 (CYANOCOBALAMIN) 100 MCG tablet, Take 1,000 mcg by mouth daily. Every other  day  Current Outpatient Medications (Other):    Ascorbic Acid (VITAMIN C) 100 MG tablet, Take 1,000 mg by mouth daily.   Biotin 1 MG CAPS, Take 1 capsule by mouth daily.    calcium carbonate (OS-CAL - DOSED IN MG OF ELEMENTAL CALCIUM) 1250 (500 Ca) MG tablet, Take 1 tablet by mouth daily with breakfast.   cholecalciferol (VITAMIN D) 1000 UNITS tablet, Take 3,000 Units by mouth daily.    doxycycline (VIBRA-TABS) 100 MG tablet, Take by mouth.   hydrocortisone (ANUSOL-HC) 2.5 % rectal cream, Place 1 Application rectally 2 (two) times daily.   L-Arginine 1000 MG TABS, Take 1 tablet by mouth daily.   mupirocin ointment (BACTROBAN) 2 %, Apply topically.   omeprazole (PRILOSEC) 20 MG capsule, Take  1 capsule  Daily  to Prevent Heartburn & Indigestion  (Dx:  k21.9  )   Probiotic Product (PROBIOTIC PO), Take 1 tablet by mouth daily.  Allergies: Allergies  Allergen Reactions   Amoxicillin-Pot Clavulanate Other (See Comments)   Levofloxacin Diarrhea   Other     Band-aid   Codeine Hives   Sulfa Antibiotics Nausea And Vomiting    Current Problems (verified) has Interstitial cystitis; GERD (gastroesophageal reflux disease); Hyperlipidemia; Hypertension; History of prediabetes; Vitamin D deficiency; Aortic atherosclerosis (HCC); Lumbar back pain with  radiculopathy affecting right lower extremity; Osteopenia; and Hemorrhoids on their problem list.  Screening Tests Health Maintenance  Topic Date Due   COVID-19 Vaccine (8 - 2023-24 season) 09/13/2022 (Originally 06/20/2022)   MAMMOGRAM  10/29/2022 (Originally 08/14/2022)   INFLUENZA VACCINE  09/18/2022   Colonoscopy  07/14/2023   Medicare Annual Wellness (AWV)  08/28/2023   Pneumonia Vaccine 50+ Years old  Completed   DEXA SCAN  Completed   Hepatitis C Screening  Completed   Zoster Vaccines- Shingrix  Completed   HPV VACCINES  Aged Out   DTaP/Tdap/Td  Discontinued   Immunization History  Administered Date(s) Administered   DTaP 10/04/2010    Fluad Quad(high Dose 65+) 10/26/2018, 12/04/2019   Influenza, High Dose Seasonal PF 11/24/2016, 12/04/2019, 11/11/2020, 10/28/2021   Influenza,inj,quad, With Preservative 11/17/2016, 10/19/2018   Influenza-Unspecified 11/29/2012, 11/29/2013, 11/17/2014, 11/27/2015, 11/24/2016, 10/04/2017, 10/23/2021   Moderna Covid-19 Vaccine Bivalent Booster 78yrs & up 06/08/2021, 04/25/2022   Moderna SARS-COV2 Booster Vaccination 11/05/2021   Moderna Sars-Covid-2 Vaccination 10/30/2020   PFIZER(Purple Top)SARS-COV-2 Vaccination 03/08/2019, 03/26/2019, 11/14/2019, 05/17/2020   Pneumococcal Conjugate-13 12/21/2013   Pneumococcal Polysaccharide-23 12/25/2016   Pneumococcal-Unspecified 12/04/2002   Respiratory Syncytial Virus Vaccine,Recomb Aduvanted(Arexvy) 12/03/2021   Zoster Recombinant(Shingrix) 05/22/2016, 09/17/2016   Zoster, Live 12/03/2005      Names of Other Physician/Practitioners you currently use: 1. [facility] here for primary care 2. Salvatore Marvel, eye doctor, last visit 2023 3. Dr. Toni Arthurs, dentist, last visit 03/2022, goes q66m  Patient Care Team: Lucky Cowboy, MD as PCP - General (Internal Medicine) Jerene Bears, MD as PCP - OBGYN (Obstetrics and Gynecology) Antonietta Barcelona, OD as Referring Physician (Optometry) August Saucer, Corrie Mckusick, MD as Consulting Physician (Orthopedic Surgery) Sanjuana Letters, MD as Referring Physician (Orthopedic Surgery) Jerene Bears, MD as Consulting Physician (Gynecology) Crist Fat, MD as Attending Physician (Urology) Newman Pies, MD as Consulting Physician (Otolaryngology) Willis Modena, MD as Consulting Physician (Gastroenterology) Charlett Nose, [facility] (Inactive) as Pharmacist (Pharmacist)  Surgical: She  has a past surgical history that includes Cesarean section; Myringotomy; Lumbar laminectomy; Carpal tunnel release; Trigger finger release; Anal sphincterotomy; Nasal sinus surgery; Abdominal  hysterectomy (2003); Hemorrhoid surgery (N/A, 1998); Cystoscopy; Tympanostomy tube placement; Tonsillectomy; Tonsillectomy (10/14); Lumbar laminectomy/decompression microdiscectomy (N/A, 08/09/2014); Cervical fusion (02/25/2018); and Knee arthroscopy (Right, 06/22/2019). Family Her family history includes Diabetes in her mother; Hyperlipidemia in her brother and mother; Hypertension in her brother and mother; Osteoporosis in her mother; Prostate cancer in her father. Social history  She reports that she quit smoking about 35 years ago. Her smoking use included cigarettes. She started smoking about 50 years ago. She has a 7.5 pack-year smoking history. She has never used smokeless tobacco. She reports current alcohol use of about 6.0 - 7.0 standard drinks of alcohol per week. She reports that she does not use drugs.   MEDICARE WELLNESS OBJECTIVES: Physical activity:  walks 4-5 times a week for 15 minutes Cardiac risk factors: Cardiac Risk Factors include: dyslipidemia;advanced age (>27men, >68 women);hypertension;smoking/ tobacco exposure Depression/mood screen:      08/28/2022   10:18 AM  Depression screen PHQ 2/9  Decreased Interest 0  Down, Depressed, Hopeless 0  PHQ - 2 Score 0    ADLs:     08/28/2022   10:17 AM  In your present state of health, do you have any difficulty performing the following activities:  Hearing? 0  Vision? 0  Difficulty concentrating or making decisions? 0  Walking or climbing stairs? 0  Dressing  or bathing? 0  Doing errands, shopping? 0     Cognitive Testing  Alert? Yes  Normal Appearance?Yes  Oriented to person? Yes  Place? Yes   Time? Yes  Recall of three objects?  Yes  Can perform simple calculations? Yes  Displays appropriate judgment?Yes  Can read the correct time from a watch face?Yes  EOL planning: Does Patient Have a Medical Advance Directive?: Yes Type of Advance Directive: Living will, Healthcare Power of Attorney Does patient want to make  changes to medical advance directive?: No - Patient declined Copy of Healthcare Power of Attorney in Chart?: No - copy requested      Objective:   Today's Vitals   08/28/22 0958  BP: 104/60  Pulse: 75  Temp: (!) 97.5 F (36.4 C)  SpO2: 98%  Weight: 103 lb 12.8 oz (47.1 kg)  Height: 4\' 10"  (1.473 m)     Body mass index is 21.69 kg/m.  General appearance: alert, no distress, WD/WN, female HEENT: normocephalic, sclerae anicteric, right TM with tube, left TM with perforation, nares patent, no discharge or erythema, pharynx normal Oral cavity: MMM, no lesions Neck: supple, no lymphadenopathy, no thyromegaly, no masses Heart: RRR, normal S1, S2, no murmurs Lungs: CTA bilaterally, no wheezes, rhonchi, or rales Abdomen: +bs, soft, non tender, non distended, no masses, no hepatomegaly, no splenomegaly Musculoskeletal: nontender, no swelling, no obvious deformity.  Extremities: no edema, no cyanosis, no clubbing Pulses: 2+ symmetric, upper and lower extremities, normal cap refill Neurological: alert, oriented x 3, CN2-12 intact, strength normal upper extremities and lower extremities, sensation mildly diminished R foot dorsum, DTRs 2+ throughout, no cerebellar signs, gait normal Skin: warm and dry. Left 3rd toe does not appear infected at toenail base Psychiatric: normal affect, behavior normal, pleasant    Medicare Attestation I have personally reviewed: The patient's medical and social history Their use of alcohol, tobacco or illicit drugs Their current medications and supplements The patient's functional ability including ADLs,fall risks, home safety risks, cognitive, and hearing and visual impairment Diet and physical activities Evidence for depression or mood disorders  The patient's weight, height, BMI, and visual acuity have been recorded in the chart.  I have made referrals, counseling, and provided education to the patient based on review of the above and I have  provided the patient with a written personalized care plan for preventive services.     Raynelle Dick, NP 10:22 AM [facility]

## 2022-08-28 ENCOUNTER — Ambulatory Visit (INDEPENDENT_AMBULATORY_CARE_PROVIDER_SITE_OTHER): Payer: PPO | Admitting: Nurse Practitioner

## 2022-08-28 ENCOUNTER — Encounter: Payer: Self-pay | Admitting: Nurse Practitioner

## 2022-08-28 VITALS — BP 104/60 | HR 75 | Temp 97.5°F | Ht <= 58 in | Wt 103.8 lb

## 2022-08-28 DIAGNOSIS — Z0001 Encounter for general adult medical examination with abnormal findings: Secondary | ICD-10-CM | POA: Diagnosis not present

## 2022-08-28 DIAGNOSIS — R6889 Other general symptoms and signs: Secondary | ICD-10-CM

## 2022-08-28 DIAGNOSIS — Z79899 Other long term (current) drug therapy: Secondary | ICD-10-CM | POA: Diagnosis not present

## 2022-08-28 DIAGNOSIS — I10 Essential (primary) hypertension: Secondary | ICD-10-CM

## 2022-08-28 DIAGNOSIS — L600 Ingrowing nail: Secondary | ICD-10-CM | POA: Diagnosis not present

## 2022-08-28 DIAGNOSIS — E782 Mixed hyperlipidemia: Secondary | ICD-10-CM | POA: Diagnosis not present

## 2022-08-28 DIAGNOSIS — K219 Gastro-esophageal reflux disease without esophagitis: Secondary | ICD-10-CM | POA: Diagnosis not present

## 2022-08-28 DIAGNOSIS — R7309 Other abnormal glucose: Secondary | ICD-10-CM | POA: Diagnosis not present

## 2022-08-28 DIAGNOSIS — I700 Atherosclerosis of aorta: Secondary | ICD-10-CM

## 2022-08-28 DIAGNOSIS — E559 Vitamin D deficiency, unspecified: Secondary | ICD-10-CM | POA: Diagnosis not present

## 2022-08-28 DIAGNOSIS — M8580 Other specified disorders of bone density and structure, unspecified site: Secondary | ICD-10-CM

## 2022-08-28 DIAGNOSIS — N3010 Interstitial cystitis (chronic) without hematuria: Secondary | ICD-10-CM

## 2022-08-28 DIAGNOSIS — Z0000 Encounter for general adult medical examination without abnormal findings: Secondary | ICD-10-CM

## 2022-08-28 DIAGNOSIS — Z789 Other specified health status: Secondary | ICD-10-CM

## 2022-08-28 NOTE — Patient Instructions (Signed)
Ingrown Toenail  An ingrown toenail occurs when the corner or sides of a toenail grow into the surrounding skin. This causes discomfort and pain. The big toe is most commonly affected, but any of the toes can be affected. If an ingrown toenail is not treated, it can become infected. What are the causes? This condition may be caused by: Wearing shoes that are too small or tight. An injury, such as stubbing your toe or having your toe stepped on. Improper cutting or care of your toenails. Having nail or foot abnormalities that were present from birth (congenital abnormalities), such as having a nail that is too big for your toe. What increases the risk? The following factors may make you more likely to develop ingrown toenails: Age. Nails tend to get thicker with age, so ingrown nails are more common among older people. Cutting your toenails incorrectly, such as cutting them very short or cutting them unevenly. An ingrown toenail is more likely to get infected if you have: Diabetes. Blood flow (circulation) problems. What are the signs or symptoms? Symptoms of an ingrown toenail may include: Pain, soreness, or tenderness. Redness. Swelling. Hardening of the skin that surrounds the toenail. Signs that an ingrown toenail may be infected include: Fluid or pus. Symptoms that get worse. How is this diagnosed? Ingrown toenails may be diagnosed based on: Your symptoms and medical history. A physical exam. Labs or tests. If you have fluid or blood coming from your toenail, a sample may be collected to test for the specific type of bacteria that is causing the infection. How is this treated? Treatment depends on the severity of your symptoms. You may be able to care for your toenail at home. If you have an infection, you may be prescribed antibiotic medicines. If you have fluid or pus draining from your toenail, your health care provider may drain it. If you have trouble walking, you may be  given crutches to use. If you have a severe or infected ingrown toenail, you may need a procedure to remove part or all of the nail. Follow these instructions at home: Foot care  Check your wound every day for signs of infection, or as often as told by your health care provider. Check for: More redness, swelling, or pain. More fluid or blood. Warmth. Pus or a bad smell. Do not pick at your toenail or try to remove it yourself. Soak your foot in warm, soapy water. Do this for 20 minutes, 3 times a day, or as often as told by your health care provider. This helps to keep your toe clean and your skin soft. Wear shoes that fit well and are not too tight. Your health care provider may recommend that you wear open-toed shoes while you heal. Trim your toenails regularly and carefully. Cut your toenails straight across to prevent injury to the skin at the corners of the toenail. Do not cut your nails in a curved shape. Keep your feet clean and dry to help prevent infection. General instructions Take over-the-counter and prescription medicines only as told by your health care provider. If you were prescribed an antibiotic, take it as told by your health care provider. Do not stop taking the antibiotic even if you start to feel better. If your health care provider told you to use crutches to help you move around, use them as instructed. Return to your normal activities as told by your health care provider. Ask your health care provider what activities are safe for you.   Keep all follow-up visits. This is important. Contact a health care provider if: You have more redness, swelling, pain, or other symptoms that do not improve with treatment. You have fluid, blood, or pus coming from your toenail. You have a red streak on your skin that starts at your foot and spreads up your leg. You have a fever. Summary An ingrown toenail occurs when the corner or sides of a toenail grow into the surrounding skin.  This causes discomfort and pain. The big toe is most commonly affected, but any of the toes can be affected. If an ingrown toenail is not treated, it can become infected. Fluid or pus draining from your toenail is a sign of infection. Your health care provider may need to drain it. You may be given antibiotics to treat the infection. Trimming your toenails regularly and properly can help you prevent an ingrown toenail. This information is not intended to replace advice given to you by your health care provider. Make sure you discuss any questions you have with your health care provider. Document Revised: 06/05/2020 Document Reviewed: 06/05/2020 Elsevier Patient Education  2024 [employer]  

## 2022-08-29 LAB — COMPLETE METABOLIC PANEL WITH GFR
AG Ratio: 2.1 (calc) (ref 1.0–2.5)
ALT: 16 U/L (ref 6–29)
AST: 17 U/L (ref 10–35)
Albumin: 4.4 g/dL (ref 3.6–5.1)
Alkaline phosphatase (APISO): 59 U/L (ref 37–153)
BUN: 16 mg/dL (ref 7–25)
CO2: 28 mmol/L (ref 20–32)
Calcium: 9.4 mg/dL (ref 8.6–10.4)
Chloride: 101 mmol/L (ref 98–110)
Creat: 0.62 mg/dL (ref 0.60–1.00)
Globulin: 2.1 g/dL (calc) (ref 1.9–3.7)
Glucose, Bld: 88 mg/dL (ref 65–99)
Potassium: 4.8 mmol/L (ref 3.5–5.3)
Sodium: 136 mmol/L (ref 135–146)
Total Bilirubin: 0.4 mg/dL (ref 0.2–1.2)
Total Protein: 6.5 g/dL (ref 6.1–8.1)
eGFR: 95 mL/min/{1.73_m2} (ref >=60)

## 2022-08-29 LAB — CBC WITH DIFFERENTIAL/PLATELET
Absolute Monocytes: 543 cells/uL (ref 200–950)
Basophils Absolute: 47 cells/uL (ref 0–200)
Basophils Relative: 0.7 %
Eosinophils Absolute: 60 cells/uL (ref 15–500)
Eosinophils Relative: 0.9 %
HCT: 39.2 % (ref 35.0–45.0)
Hemoglobin: 13.1 g/dL (ref 11.7–15.5)
Lymphs Abs: 1595 cells/uL (ref 850–3900)
MCH: 32.3 pg (ref 27.0–33.0)
MCHC: 33.4 g/dL (ref 32.0–36.0)
MCV: 96.6 fL (ref 80.0–100.0)
MPV: 12.4 fL (ref 7.5–12.5)
Monocytes Relative: 8.1 %
Neutro Abs: 4456 cells/uL (ref 1500–7800)
Neutrophils Relative %: 66.5 %
Platelets: 240 10*3/uL (ref 140–400)
RBC: 4.06 10*6/uL (ref 3.80–5.10)
RDW: 11.9 % (ref 11.0–15.0)
Total Lymphocyte: 23.8 %
WBC: 6.7 10*3/uL (ref 3.8–10.8)

## 2022-08-29 LAB — LIPID PANEL
Cholesterol: 213 mg/dL — ABNORMAL HIGH (ref <200)
HDL: 95 mg/dL (ref >=50)
LDL Cholesterol (Calc): 104 mg/dL (calc) — ABNORMAL HIGH
Non-HDL Cholesterol (Calc): 118 mg/dL (calc) (ref <130)
Total CHOL/HDL Ratio: 2.2 (calc) (ref <5.0)
Triglycerides: 56 mg/dL (ref <150)

## 2022-08-29 LAB — TSH: TSH: 1.55 mIU/L (ref 0.40–4.50)

## 2022-08-29 LAB — MAGNESIUM: Magnesium: 2 mg/dL (ref 1.5–2.5)

## 2022-08-31 ENCOUNTER — Encounter (HOSPITAL_BASED_OUTPATIENT_CLINIC_OR_DEPARTMENT_OTHER): Payer: Self-pay | Admitting: Obstetrics & Gynecology

## 2022-09-12 ENCOUNTER — Ambulatory Visit
Admission: EM | Admit: 2022-09-12 | Discharge: 2022-09-12 | Disposition: A | Discharge status: Home / Self Care | Payer: PPO | Attending: Emergency Medicine | Admitting: Emergency Medicine

## 2022-09-12 DIAGNOSIS — N3001 Acute cystitis with hematuria: Secondary | ICD-10-CM | POA: Diagnosis not present

## 2022-09-12 LAB — POCT URINALYSIS DIP (MANUAL ENTRY)
Glucose, UA: 250 mg/dL — AB
Ketones, POC UA: NEGATIVE mg/dL
Nitrite, UA: POSITIVE — AB
Protein Ur, POC: 100 mg/dL — AB
Spec Grav, UA: 1.01 (ref 1.010–1.025)
Urobilinogen, UA: 2 E.U./dL — AB
pH, UA: 7 (ref 5.0–8.0)

## 2022-09-12 MED ORDER — FOSFOMYCIN TROMETHAMINE 3 G PO PACK: 3.0000 g | PACK | Freq: Once | ORAL | 0 refills | Status: AC

## 2022-09-12 NOTE — ED Triage Notes (Signed)
Pt presents with c/o burning when voiding and urinary frequency. States she had OTC pyridium.

## 2022-09-12 NOTE — ED Provider Notes (Signed)
[facility]    CSN: [id number] Arrival date & time: 09/12/22  7829    HISTORY  No chief complaint on file.  HPI Helen Glover is a pleasant, 72 y.o. female who presents to urgent care today. Patient complains of burning with urination and increased frequency of urination.  Patient states she was unable to get an appointment with her urologist until the afternoon.  Patient states that a year ago she was having trouble with frequent urinary tract infections but things have calm down over the past year.  Patient states this is the first urinary tract infection she has had several months.  Patient states she took Pyridium this morning.    The history is provided by the patient.   Past Medical History:  Diagnosis Date   Anxiety    Arthritis    in neck   Bursitis of right hip    Carpal tunnel syndrome, bilateral    rt wrist   Complete rotator cuff tear of left shoulder    Eagle's syndrome    GERD (gastroesophageal reflux disease)    Hearing loss of both ears 12/25/2014   Has hearing aids/tubes    Heart murmur    as child   HNP (herniated nucleus pulposus)    Ingrown toenail of right foot 2023   Interstitial cystitis    Kidney cysts    followed by Dr. Marlou Porch   Osteopenia    Plantar fasciitis    Spinal stenosis    Tingling    rt foot   Wears hearing aid    Patient Active Problem List   Diagnosis Date Noted   Hemorrhoids 04/10/2021   Osteopenia 02/25/2021   Lumbar back pain with radiculopathy affecting right lower extremity 07/10/2020   Aortic atherosclerosis (HCC) 07/07/2019   Hyperlipidemia    Hypertension    History of prediabetes    Vitamin D deficiency    GERD (gastroesophageal reflux disease) 11/17/2011   Interstitial cystitis    Past Surgical History:  Procedure Laterality Date   ABDOMINAL HYSTERECTOMY  2003   BSO   ANAL SPHINCTEROTOMY     CARPAL TUNNEL RELEASE     LEFT   CERVICAL FUSION  02/25/2018   c1-c2 with laminectomy   CESAREAN  SECTION     CYSTOSCOPY     HEMORRHOID SURGERY N/A 1998   KNEE ARTHROSCOPY Right 06/22/2019   Dr. Ranell Patrick    LUMBAR LAMINECTOMY     LUMBAR LAMINECTOMY/DECOMPRESSION MICRODISCECTOMY N/A 08/09/2014   Procedure: MICRO LUMBAR DECOMPRESSION L3-4,  REDO DECOMPRESSION, MICRODISCECTOMY L4-5, FORAMINOTOMY L3-L4, L4-L5 BILATERAL;  Surgeon: Jene Every, MD;  Location: [facility];  Service: Orthopedics;  Laterality: N/A;   MYRINGOTOMY     NASAL SINUS SURGERY     TONSILLECTOMY     LEFT only, due to Eagle Syndrome   TONSILLECTOMY  10/14   right side due to Eagles syndrome   TRIGGER FINGER RELEASE     RIGHT   TYMPANOSTOMY TUBE PLACEMENT     OB History     Gravida  2   Para  2   Term  1   Preterm  1   AB  0   Living  2      SAB  0   IAB  0   Ectopic  0   Multiple  0   Live Births             Home Medications    Prior to Admission medications   Medication Sig Start  Date End Date Taking? Authorizing Provider  fosfomycin (MONUROL) 3 g PACK Take 3 g by mouth once for 1 dose. 09/12/22 09/12/22 Yes Theadora Rama Scales, PA-C  Ascorbic Acid (VITAMIN C) 100 MG tablet Take 1,000 mg by mouth daily.    [provider]  Biotin 1 MG CAPS Take 1 capsule by mouth daily.     [provider]  calcium carbonate (OS-CAL - DOSED IN MG OF ELEMENTAL CALCIUM) 1250 (500 Ca) MG tablet Take 1 tablet by mouth daily with breakfast.    [provider]  cholecalciferol (VITAMIN D) 1000 UNITS tablet Take 3,000 Units by mouth daily.     [provider]  estradiol (VIVELLE-DOT) 0.025 MG/24HR Place 1 patch onto the skin 2 (two) times a week. 10/07/21   Jerene Bears, MD  hydrocortisone (ANUSOL-HC) 2.5 % rectal cream Place 1 Application rectally 2 (two) times daily. 12/09/21   Raynelle Dick, NP  L-Arginine 1000 MG TABS Take 1 tablet by mouth daily.    [provider]  meloxicam (MOBIC) 15 MG tablet Take  1/2 to 1 tablet  Daily  with Food  for Pain & Inflammation  08/19/22   Lucky Cowboy, MD  omeprazole (PRILOSEC) 20 MG capsule Take  1 capsule  Daily  to Prevent Heartburn & Indigestion  (Dx:  k21.9  ) 10/23/21   Lucky Cowboy, MD  Probiotic Product (PROBIOTIC PO) Take 1 tablet by mouth daily.    [provider]  vitamin B-12 (CYANOCOBALAMIN) 100 MCG tablet Take 1,000 mcg by mouth daily. Every other day    [provider]    Family History Family History  Problem Relation Age of Onset   Diabetes Mother    Hypertension Mother    Hyperlipidemia Mother    Osteoporosis Mother    Hypertension Brother    Hyperlipidemia Brother    Prostate cancer Father        Bone   Social History Social History   Tobacco Use   Smoking status: Former    Current packs/day: 0.00    Average packs/day: 0.5 packs/day for 15.0 years (7.5 ttl pk-yrs)    Types: Cigarettes    Start date: 12/04/1971    Quit date: 12/04/1986    Years since quitting: 35.7   Smokeless tobacco: Never  Vaping Use   Vaping status: Never Used  Substance Use Topics   Alcohol use: Yes    Alcohol/week: 6.0 - 7.0 standard drinks of alcohol    Types: 6 - 7 Standard drinks or equivalent per week   Drug use: No   Allergies   Amoxicillin-pot clavulanate, Levofloxacin, Other, Codeine, and Sulfa antibiotics  Review of Systems Review of Systems Pertinent findings revealed after performing a 14 point review of systems has been noted in the history of present illness.  Physical Exam Vital Signs BP 129/77 (BP Location: Left Arm)   Pulse 63   Temp 98 F (36.7 C) (Oral)   Resp 18   [name] 10/18/2001   SpO2 97%   No data found.  Physical Exam Vitals and nursing note reviewed.  Constitutional:      General: She is not in acute distress.    Appearance: Normal appearance. She is not ill-appearing.  HENT:     Head: Normocephalic and atraumatic.  Eyes:     General: Lids are normal.        Right eye: No discharge.        Left eye: No discharge.  Extraocular Movements:  Extraocular movements intact.     Conjunctiva/sclera: Conjunctivae normal.     Right eye: Right conjunctiva is not injected.     Left eye: Left conjunctiva is not injected.  Neck:     Trachea: Trachea and phonation normal.  Cardiovascular:     Rate and Rhythm: Normal rate and regular rhythm.     Pulses: Normal pulses.     Heart sounds: Normal heart sounds. No murmur heard.    No friction rub. No gallop.  Pulmonary:     Effort: Pulmonary effort is normal. No accessory muscle usage, prolonged expiration or respiratory distress.     Breath sounds: Normal breath sounds. No stridor, decreased air movement or transmitted upper airway sounds. No decreased breath sounds, wheezing, rhonchi or rales.  Chest:     Chest wall: No tenderness.  Abdominal:     General: Abdomen is flat. Bowel sounds are normal. There is no distension.     Palpations: Abdomen is soft.     Tenderness: There is abdominal tenderness in the suprapubic area. There is no right CVA tenderness or left CVA tenderness.     Hernia: No hernia is present.  Musculoskeletal:        General: Normal range of motion.     Cervical back: Normal range of motion and neck supple. Normal range of motion.  Lymphadenopathy:     Cervical: No cervical adenopathy.  Skin:    General: Skin is warm and dry.     Findings: No erythema or rash.  Neurological:     General: No focal deficit present.     Mental Status: She is alert and oriented to person, place, and time.  Psychiatric:        Mood and Affect: Mood normal.        Behavior: Behavior normal.     Visual Acuity Right Eye Distance:   Left Eye Distance:   Bilateral Distance:    Right Eye Near:   Left Eye Near:    Bilateral Near:     [facility] Couse / Diagnostics / Procedures:     Radiology No results found.  Procedures Procedures (including critical care time) EKG  Pending results:  Labs Reviewed  POCT URINALYSIS DIP (MANUAL ENTRY) - Abnormal; Notable for the following  components:      Result Value   Color, UA orange (*)    Clarity, UA hazy (*)    Glucose, UA =250 (*)    Bilirubin, UA small (*)    Blood, UA moderate (*)    Protein Ur, POC =100 (*)    Urobilinogen, UA 2.0 (*)    Nitrite, UA Positive (*)    Leukocytes, UA Large (3+) (*)    All other components within normal limits  URINE CULTURE    Medications Ordered in UC: Medications - No data to display  UC Diagnoses / Final Clinical Impressions(s)   I have reviewed the triage vital signs and the nursing notes.  Pertinent labs & imaging results that were available during my care of the patient were reviewed by me and considered in my medical decision making (see chart for details).    Final diagnoses:  Acute cystitis with hematuria   Results of urine dip performed today are unreliable due to patient having taken Pyridium. Patient was advised to begin antibiotics today due to having active symptoms of urinary tract infection.  Patient advised that they will be contacted with results and that adjustments to treatment will be provided as indicated based on the results.   Patient was advised of possibility that urine culture results may be negative if sample provided was obtained late in the day causing urine to be more diluted.  Patient was advised that if antibiotics were effective after the first 24 to 36 hours, despite negative urine culture result, it is recommended that they complete the full course as prescribed.   Return precautions advised.  Please see discharge instructions below for details of plan of care as provided to patient. ED Prescriptions     Medication Sig Dispense Auth. Provider   fosfomycin (MONUROL) 3 g PACK Take 3 g by mouth once for 1 dose. 3 g Theadora Rama Scales, PA-C      PDMP not reviewed this encounter.  Disposition Upon Discharge:  Condition: stable for discharge home  Patient presented with concern for an acute illness with associated  systemic symptoms and significant discomfort requiring urgent management. In my opinion, this is a condition that a prudent lay person (someone who possesses an average knowledge of health and medicine) may potentially expect to result in complications if not addressed urgently such as respiratory distress, impairment of bodily function or dysfunction of bodily organs.   As such, the patient has been evaluated and assessed, work-up was performed and treatment was provided in alignment with urgent care protocols and evidence based medicine.  Patient/parent/caregiver has been advised that the patient may require follow up for further testing and/or treatment if the symptoms continue in spite of treatment, as clinically indicated and appropriate.  Routine symptom specific, illness specific and/or disease specific instructions were discussed with the patient and/or caregiver at length.  Prevention strategies for avoiding STD exposure were also discussed.  The patient will follow up with their current PCP if and as advised. If the patient does not currently have a PCP we will assist them in obtaining one.   The patient may need specialty follow up if the symptoms continue, in spite of conservative treatment and management, for further workup, evaluation, consultation and treatment as clinically indicated and appropriate.  Patient/parent/caregiver verbalized understanding and agreement of plan as discussed.  All questions were addressed during visit.  Please see discharge instructions below for further details of plan.  Discharge Instructions:   Discharge Instructions      Common causes of urinary tract infections include but are not limited to holding your urine longer than you should, squatting instead of sitting down when urinating, sitting around in wet clothing such as a wet swimsuit or gym clothes too long, not emptying your bladder after having sexual intercourse, wiping from back to front instead  of front to back after having a bowel movement.     Less common causes of urinary tract infections include but are not limited to anatomical shifts in the location of your bladder or uterus causing obstruction of passage of urine from your bladder to your urethra where your urine comes out or prolapse of your rectum into your vaginal wall.  These less common causes can be evaluated by gynecologist, a urologist or subspecialist called a uro-gynecologist   The urinalysis that we performed in the clinic today was abnormal.  Urine culture will be performed per our protocol.  The result of the urine culture will be available in the next 3 to 5 days and will be posted to your MyChart account.  If there is an  abnormal finding, you will be contacted by phone and advised of further treatment recommendations, if any.   You were advised to begin antibiotics today because your urinalysis is abnormal and you are having active symptoms of an acute lower urinary tract infection also known as cystitis.     Please pick up and begin taking your prescription for Fosfomycin as soon as possible.  Please take this single dose exactly as prescribed.  You can take this medication with or without food.  This medication is safe to take with your other medications.   If you have not had complete resolution of your symptoms after completing treatment as prescribed, please return to urgent care for repeat evaluation or follow-up with your urologist.  Repeat urinalysis and urine culture may be indicated for more directed therapy.   Thank you for visiting urgent care today.  I appreciate the opportunity to participate in your care.     This office note has been dictated using Teaching laboratory technician.  Unfortunately, this method of dictation can sometimes lead to typographical or grammatical errors.  I apologize for your inconvenience in advance if this occurs.  Please do not hesitate to reach out to me if clarification  is needed.       Theadora Rama Scales, PA-C 09/12/22 1041

## 2022-09-12 NOTE — Discharge Instructions (Signed)
Common causes of urinary tract infections include but are not limited to holding your urine longer than you should, squatting instead of sitting down when urinating, sitting around in wet clothing such as a wet swimsuit or gym clothes too long, not emptying your bladder after having sexual intercourse, wiping from back to front instead of front to back after having a bowel movement.     Less common causes of urinary tract infections include but are not limited to anatomical shifts in the location of your bladder or uterus causing obstruction of passage of urine from your bladder to your urethra where your urine comes out or prolapse of your rectum into your vaginal wall.  These less common causes can be evaluated by gynecologist, a urologist or subspecialist called a uro-gynecologist   The urinalysis that we performed in the clinic today was abnormal.  Urine culture will be performed per our protocol.  The result of the urine culture will be available in the next 3 to 5 days and will be posted to your MyChart account.  If there is an abnormal finding, you will be contacted by phone and advised of further treatment recommendations, if any.   You were advised to begin antibiotics today because your urinalysis is abnormal and you are having active symptoms of an acute lower urinary tract infection also known as cystitis.     Please pick up and begin taking your prescription for Fosfomycin as soon as possible.  Please take this single dose exactly as prescribed.  You can take this medication with or without food.  This medication is safe to take with your other medications.   If you have not had complete resolution of your symptoms after completing treatment as prescribed, please return to urgent care for repeat evaluation or follow-up with your urologist.  Repeat urinalysis and urine culture may be indicated for more directed therapy.   Thank you for visiting urgent care today.  I appreciate the opportunity  to participate in your care.

## 2022-09-22 DIAGNOSIS — Z1231 Encounter for screening mammogram for malignant neoplasm of breast: Secondary | ICD-10-CM | POA: Diagnosis not present

## 2022-10-14 ENCOUNTER — Ambulatory Visit: Payer: PPO | Admitting: Internal Medicine

## 2022-10-15 ENCOUNTER — Encounter (HOSPITAL_BASED_OUTPATIENT_CLINIC_OR_DEPARTMENT_OTHER): Payer: Self-pay | Admitting: Obstetrics & Gynecology

## 2022-10-27 DIAGNOSIS — N3010 Interstitial cystitis (chronic) without hematuria: Secondary | ICD-10-CM | POA: Diagnosis not present

## 2022-10-27 DIAGNOSIS — N3020 Other chronic cystitis without hematuria: Secondary | ICD-10-CM | POA: Diagnosis not present

## 2022-11-06 ENCOUNTER — Ambulatory Visit (INDEPENDENT_AMBULATORY_CARE_PROVIDER_SITE_OTHER): Payer: PPO

## 2022-11-06 ENCOUNTER — Ambulatory Visit: Payer: PPO | Admitting: Podiatry

## 2022-11-06 DIAGNOSIS — L608 Other nail disorders: Secondary | ICD-10-CM | POA: Diagnosis not present

## 2022-11-06 DIAGNOSIS — M79672 Pain in left foot: Secondary | ICD-10-CM | POA: Diagnosis not present

## 2022-11-06 DIAGNOSIS — L603 Nail dystrophy: Secondary | ICD-10-CM | POA: Diagnosis not present

## 2022-11-06 DIAGNOSIS — L601 Onycholysis: Secondary | ICD-10-CM | POA: Diagnosis not present

## 2022-11-06 NOTE — Progress Notes (Signed)
Subjective: Chief Complaint  Patient presents with   Nail Problem    ( Rm 13) Right third toenail was infected - in July- was on antibiotics from ED and it has improved.  Still has some stuff under the nail.  - wants to have it checked    Foot Pain    Left heel pain. Wakes her up at night.  Pain in one spot on the heel.  No pain with first step in the am.      72 year old female presents the office today for concerns.  Initially she made the  appointment for her right third toenail that was infected in July. She was moving stuff and she started to have the issues and was seen at the [facility].  They thought it was from the rubbing from the moving.  She was on antibiotics and soaking.  Infections, but she is concerned that the toenail sticking out.  Currently not causing any pain, swelling or redness or drainage.  Left heel has been hurting last 1-2 months. No injuries. Sometimes it will hurt against the shoe but most of it is from the pressure on the back of the heel. No injuries she reports to this area. No recent treatment.   Objective: AAO x3, NAD DP/PT pulses palpable bilaterally, CRT less than 3 seconds The right third digit nail is loose and underlying nailbed is mildly hypertrophic, dystrophic with brown discoloration and subungual debris.  No edema, erythema, drainage or pus or any signs of infection.  No pain today.  There is tenderness to the posterior aspect of the heel and there is a small palpable spur, prominence noted.  There is no pain on the Achilles tendon.  There is no pain with lateral compression of calcaneus.  Unable to appreciate any other areas of discomfort at this time.  MMT 5/5. No pain with calf compression, swelling, warmth, erythema  Assessment: 72 year old female with right third toenail dystrophy with apparently resolved infection; heel pain  Plan: -All treatment options discussed with the patient including all alternatives, risks, complications.   -X-rays obtained reviewed of the left foot.  3 views of the foot were obtained.  Haglund's deformity noted but no significant posterior calcaneal spurring present otherwise.  Mild inferior calcaneal spur. No evidence of acute fracture. -In regards to the right third toenail sharply debrided this to any complications or bleeding.  I sent the nail for culture,.  No signs of infection but continue to monitor for any reoccurrence. -Regards the heel pain seems to be from more pressure.  Dispensed a gel cushion to help offload on the left heel.  Discussed shoe modifications avoid excess pressure.  Topical anti-inflammatory such as Voltaren gel. -Patient encouraged to call the office with any questions, concerns, change in symptoms.   Vivi Barrack DPM

## 2022-11-10 ENCOUNTER — Encounter: Payer: Self-pay | Admitting: Podiatry

## 2022-11-13 ENCOUNTER — Other Ambulatory Visit: Payer: Self-pay | Admitting: Podiatry

## 2022-11-13 DIAGNOSIS — M65331 Trigger finger, right middle finger: Secondary | ICD-10-CM | POA: Diagnosis not present

## 2022-11-13 DIAGNOSIS — G5601 Carpal tunnel syndrome, right upper limb: Secondary | ICD-10-CM | POA: Diagnosis not present

## 2022-11-13 MED ORDER — METHYLPREDNISOLONE 4 MG PO TBPK: ORAL_TABLET | ORAL | 0 refills | Status: DC

## 2022-11-17 ENCOUNTER — Ambulatory Visit
Admission: EM | Admit: 2022-11-17 | Discharge: 2022-11-17 | Disposition: A | Discharge status: Home / Self Care | Payer: PPO | Attending: Internal Medicine | Admitting: Internal Medicine

## 2022-11-17 DIAGNOSIS — L03011 Cellulitis of right finger: Secondary | ICD-10-CM

## 2022-11-17 MED ORDER — CEPHALEXIN 500 MG PO CAPS: 500.0000 mg | ORAL_CAPSULE | Freq: Four times a day (QID) | ORAL | 0 refills | Status: AC

## 2022-11-17 NOTE — Discharge Instructions (Signed)
Start Keflex four times a day for 7 days.  Please follow-up with your PCP in 2 days for recheck.  Please go to the ER if you develop any worsening symptoms.  I hope you feel better soon!

## 2022-11-17 NOTE — ED Triage Notes (Signed)
Pt presents to UC w/ c/o right thumb pain, redness, swelling x4 days. Denies direct injury. Pt states she has trigger finger and carpel tunnel but does not feel that this is not related. Pt currently on steroid pack prescribed by podiatrist.

## 2022-11-17 NOTE — ED Provider Notes (Signed)
[facility]    CSN: [id number] Arrival date & time: 11/17/22  0810      History   Chief Complaint No chief complaint on file.   HPI Helen Glover is a 72 y.o. female presents for thumb pain. Pt reports 4 days of redness, swelling, and pain tot he right thumb. Denies injury. No fevers or chills.  Endorses cutting her nails recently.  Denies any drainage.  No history of MRSA.  She is currently on a Medrol Dosepak for a foot issue.  She also states she was on antibiotics at the beginning of September for UTI that has resolved.  No OTC medications have been used since onset.  No other concerns at this time.  HPI  Past Medical History:  Diagnosis Date   Anxiety    Arthritis    in neck   Bursitis of right hip    Carpal tunnel syndrome, bilateral    rt wrist   Complete rotator cuff tear of left shoulder    Eagle's syndrome    GERD (gastroesophageal reflux disease)    Hearing loss of both ears 12/25/2014   Has hearing aids/tubes    Heart murmur    as child   HNP (herniated nucleus pulposus)    Ingrown toenail of right foot 2023   Interstitial cystitis    Kidney cysts    followed by Dr. Marlou Porch   Osteopenia    Plantar fasciitis    Spinal stenosis    Tingling    rt foot   Wears hearing aid     Patient Active Problem List   Diagnosis Date Noted   Hemorrhoids 04/10/2021   Osteopenia 02/25/2021   Lumbar back pain with radiculopathy affecting right lower extremity 07/10/2020   Aortic atherosclerosis (HCC) 07/07/2019   Hyperlipidemia    Hypertension    History of prediabetes    Vitamin D deficiency    GERD (gastroesophageal reflux disease) 11/17/2011   Interstitial cystitis     Past Surgical History:  Procedure Laterality Date   ABDOMINAL HYSTERECTOMY  2003   BSO   ANAL SPHINCTEROTOMY     CARPAL TUNNEL RELEASE     LEFT   CERVICAL FUSION  02/25/2018   c1-c2 with laminectomy   CESAREAN SECTION     CYSTOSCOPY     HEMORRHOID SURGERY N/A 1998   KNEE  ARTHROSCOPY Right 06/22/2019   Dr. Ranell Patrick    LUMBAR LAMINECTOMY     LUMBAR LAMINECTOMY/DECOMPRESSION MICRODISCECTOMY N/A 08/09/2014   Procedure: MICRO LUMBAR DECOMPRESSION L3-4,  REDO DECOMPRESSION, MICRODISCECTOMY L4-5, FORAMINOTOMY L3-L4, L4-L5 BILATERAL;  Surgeon: Jene Every, MD;  Location: [facility];  Service: Orthopedics;  Laterality: N/A;   MYRINGOTOMY     NASAL SINUS SURGERY     TONSILLECTOMY     LEFT only, due to Eagle Syndrome   TONSILLECTOMY  10/14   right side due to Eagles syndrome   TRIGGER FINGER RELEASE     RIGHT   TYMPANOSTOMY TUBE PLACEMENT      OB History     Gravida  2   Para  2   Term  1   Preterm  1   AB  0   Living  2      SAB  0   IAB  0   Ectopic  0   Multiple  0   Live Births               Home Medications    Prior to Admission medications  Medication Sig Start Date End Date Taking? Authorizing Provider  Ascorbic Acid (VITAMIN C) 100 MG tablet Take 1,000 mg by mouth daily.   Yes [provider]  calcium carbonate (OS-CAL - DOSED IN MG OF ELEMENTAL CALCIUM) 1250 (500 Ca) MG tablet Take 1 tablet by mouth daily with breakfast.   Yes [provider]  cephALEXin (KEFLEX) 500 MG capsule Take 1 capsule (500 mg total) by mouth 4 (four) times daily for 7 days. 11/17/22 11/24/22 Yes Radford Pax, NP  cholecalciferol (VITAMIN D) 1000 UNITS tablet Take 3,000 Units by mouth daily.    Yes [provider]  estradiol (VIVELLE-DOT) 0.025 MG/24HR Place 1 patch onto the skin 2 (two) times a week. 10/07/21  Yes Jerene Bears, MD  methylPREDNISolone (MEDROL DOSEPAK) 4 MG TBPK tablet Take as directed 11/13/22  Yes McCaughan, Dia D, DPM  omeprazole (PRILOSEC) 20 MG capsule Take  1 capsule  Daily  to Prevent Heartburn & Indigestion  (Dx:  k21.9  ) 10/23/21  Yes Lucky Cowboy, MD  vitamin B-12 (CYANOCOBALAMIN) 100 MCG tablet Take 1,000 mcg by mouth daily. Every other day   Yes [provider]  Biotin 1 MG CAPS Take 1  capsule by mouth daily.     [provider]  hydrocortisone (ANUSOL-HC) 2.5 % rectal cream Place 1 Application rectally 2 (two) times daily. 12/09/21   Raynelle Dick, NP  L-Arginine 1000 MG TABS Take 1 tablet by mouth daily.    [provider]  meloxicam (MOBIC) 15 MG tablet Take  1/2 to 1 tablet  Daily  with Food  for Pain & Inflammation 08/19/22   Lucky Cowboy, MD  Probiotic Product (PROBIOTIC PO) Take 1 tablet by mouth daily.    [provider]    Family History Family History  Problem Relation Age of Onset   Diabetes Mother    Hypertension Mother    Hyperlipidemia Mother    Osteoporosis Mother    Hypertension Brother    Hyperlipidemia Brother    Prostate cancer Father        Bone    Social History Social History   Tobacco Use   Smoking status: Former    Current packs/day: 0.00    Average packs/day: 0.5 packs/day for 15.0 years (7.5 ttl pk-yrs)    Types: Cigarettes    Start date: 12/04/1971    Quit date: 12/04/1986    Years since quitting: 35.9   Smokeless tobacco: Never  Vaping Use   Vaping status: Never Used  Substance Use Topics   Alcohol use: Yes    Alcohol/week: 6.0 - 7.0 standard drinks of alcohol    Types: 6 - 7 Standard drinks or equivalent per week   Drug use: No     Allergies   Amoxicillin-pot clavulanate, Levofloxacin, Other, Codeine, and Sulfa antibiotics   Review of Systems Review of Systems  Skin:        Right thumb swelling and pain      Physical Exam Triage Vital Signs ED Triage Vitals  Encounter Vitals Group     BP 11/17/22 0830 129/77     Systolic BP Percentile --      Diastolic BP Percentile --      Pulse Rate 11/17/22 0830 71     Resp 11/17/22 0830 16     Temp 11/17/22 0830 (!) 97.3 F (36.3 C)     Temp Source 11/17/22 0830 Oral     SpO2 11/17/22 0830 99 %  Weight --      Height --      Head Circumference --      Peak Flow --      Pain Score 11/17/22 0828 6     Pain Loc --      Pain  Education --      Exclude from Growth Chart --    No data found.  Updated Vital Signs BP 129/77 (BP Location: Right Arm)   Pulse 71   Temp (!) 97.3 F (36.3 C) (Oral)   Resp 16   [name] 10/18/2001   SpO2 99%   Visual Acuity Right Eye Distance:   Left Eye Distance:   Bilateral Distance:    Right Eye Near:   Left Eye Near:    Bilateral Near:     Physical Exam Vitals and nursing note reviewed.  Constitutional:      General: She is not in acute distress.    Appearance: Normal appearance. She is not ill-appearing.  HENT:     Head: Normocephalic and atraumatic.  Eyes:     Pupils: Pupils are equal, round, and reactive to light.  Cardiovascular:     Rate and Rhythm: Normal rate.  Pulmonary:     Effort: Pulmonary effort is normal.  Musculoskeletal:       Hands:     Comments: There is mild swelling and erythema without warmth to the volar aspect of the right thumb. there is dry, peeling skin to the medial thumb nail.  TTP. Cap refill +2.   Skin:    General: Skin is warm and dry.  Neurological:     General: No focal deficit present.     Mental Status: She is alert and oriented to person, place, and time.  Psychiatric:        Mood and Affect: Mood normal.        Behavior: Behavior normal.      UC Treatments / Results  Labs (all labs ordered are listed, but only abnormal results are displayed) Labs Reviewed - No data to display  EKG   Radiology No results found.  Procedures Procedures (including critical care time)  Medications Ordered in UC Medications - No data to display  Initial Impression / Assessment and Plan / UC Course  I have reviewed the triage vital signs and the nursing notes.  Pertinent labs & imaging results that were available during my care of the patient were reviewed by me and considered in my medical decision making (see chart for details).     Reviewed exam and symptoms with patient.  No red flags.  Discussed cellulitis.  Low suspicion  for felon.  Start Keflex 4 times daily for 7 days.  Advised PCP follow-up 2 days for recheck.  ER precautions reviewed. Final Clinical Impressions(s) / UC Diagnoses   Final diagnoses:  Cellulitis of thumb, right     Discharge Instructions      Start Keflex four times a day for 7 days.  Please follow-up with your PCP in 2 days for recheck.  Please go to the ER if you develop any worsening symptoms.  I hope you feel better soon!    ED Prescriptions     Medication Sig Dispense Auth. Provider   cephALEXin (KEFLEX) 500 MG capsule Take 1 capsule (500 mg total) by mouth 4 (four) times daily for 7 days. 28 capsule Radford Pax, NP      PDMP not reviewed this encounter.   Radford Pax, NP 11/17/22 [phone]

## 2022-11-21 ENCOUNTER — Encounter: Payer: Self-pay | Admitting: Internal Medicine

## 2022-11-23 ENCOUNTER — Encounter: Payer: Self-pay | Admitting: Podiatry

## 2022-11-25 ENCOUNTER — Encounter: Payer: PPO | Admitting: Nurse Practitioner

## 2022-11-26 ENCOUNTER — Ambulatory Visit (INDEPENDENT_AMBULATORY_CARE_PROVIDER_SITE_OTHER): Payer: PPO | Admitting: Nurse Practitioner

## 2022-11-26 ENCOUNTER — Encounter: Payer: Self-pay | Admitting: Nurse Practitioner

## 2022-11-26 VITALS — BP 130/78 | HR 66 | Temp 97.6°F | Ht <= 58 in | Wt 107.2 lb

## 2022-11-26 DIAGNOSIS — Z09 Encounter for follow-up examination after completed treatment for conditions other than malignant neoplasm: Secondary | ICD-10-CM

## 2022-11-26 DIAGNOSIS — L03011 Cellulitis of right finger: Secondary | ICD-10-CM

## 2022-11-26 NOTE — Patient Instructions (Signed)

## 2022-11-26 NOTE — Progress Notes (Signed)
Urgent Care follow up  Assessment and Plan: Urgent Care visit follow up for:   Hospital discharge follow-up Reviewed discharge instructions in full including medication changes, diagnostics, labs, and future follow ups appointment. All questions and concerns addressed.   Cellulitis of thumb, right Improved and well healed Continue to monitor.  All medications were reviewed with patient. All questions answered fully, and patient and family members were encouraged to call the office with any further questions or concerns. Discussed goal to avoid readmission related to this diagnosis.   Over 15 minutes of exam, counseling, chart review, and complex, high/moderate level critical decision making was performed this visit.   Future Appointments  Date Time Provider Department Center  12/04/2022 10:00 AM Raynelle Dick, NP [facility] None  12/11/2022  1:10 PM Newman Pies, MD [facility] None  01/08/2023  3:55 PM Jerene Bears, MD [facility]  01/13/2023 11:30 AM Epimenio Foot, Pearletha Furl, MD [facility] None  08/28/2023 10:30 AM Raynelle Dick, NP [facility] None     HPI 72 y.o.female presents for follow up for transition from recent UC visit. Admit date to the hospital was 11/17/22, patient was discharged from the hospital on 11/17/22 and our clinical staff contacted the office the day after discharge to set up a follow up appointment. The discharge summary, medications, and diagnostic test results were reviewed before meeting with the patient. The patient was treated for:   Pt reported 4 days of redness, swelling, and pain tot he right thumb. Denied injury. No fevers or chills.  Endorsed cutting her nails.  Denied any drainage.  No history of MRSA.  She is currently on a Medrol Dosepak for a foot issue.  She also states she was on antibiotics at the beginning of September for UTI that has resolved.  No OTC medications have been used since onset.    Today she is feeling well.  Area is well healed.     Images while in the hospital: No results found.   Current Outpatient Medications (Endocrine & Metabolic):    estradiol (VIVELLE-DOT) 0.025 MG/24HR, Place 1 patch onto the skin 2 (two) times a week.   methylPREDNISolone (MEDROL DOSEPAK) 4 MG TBPK tablet, Take as directed    Current Outpatient Medications (Analgesics):    meloxicam (MOBIC) 15 MG tablet, Take  1/2 to 1 tablet  Daily  with Food  for Pain & Inflammation  Current Outpatient Medications (Hematological):    vitamin B-12 (CYANOCOBALAMIN) 100 MCG tablet, Take 1,000 mcg by mouth daily. Every other day  Current Outpatient Medications (Other):    Ascorbic Acid (VITAMIN C) 100 MG tablet, Take 1,000 mg by mouth daily.   Biotin 1 MG CAPS, Take 1 capsule by mouth daily.    calcium carbonate (OS-CAL - DOSED IN MG OF ELEMENTAL CALCIUM) 1250 (500 Ca) MG tablet, Take 1 tablet by mouth daily with breakfast.   cholecalciferol (VITAMIN D) 1000 UNITS tablet, Take 3,000 Units by mouth daily.    hydrocortisone (ANUSOL-HC) 2.5 % rectal cream, Place 1 Application rectally 2 (two) times daily.   L-Arginine 1000 MG TABS, Take 1 tablet by mouth daily.   omeprazole (PRILOSEC) 20 MG capsule, Take  1 capsule  Daily  to Prevent Heartburn & Indigestion  (Dx:  k21.9  )   Probiotic Product (PROBIOTIC PO), Take 1 tablet by mouth daily.  Past Medical History:  Diagnosis Date   Anxiety    Arthritis    in neck   Bursitis of right hip    Carpal  tunnel syndrome, bilateral    rt wrist   Complete rotator cuff tear of left shoulder    Eagle's syndrome    GERD (gastroesophageal reflux disease)    Hearing loss of both ears 12/25/2014   Has hearing aids/tubes    Heart murmur    as child   HNP (herniated nucleus pulposus)    Ingrown toenail of right foot 2023   Interstitial cystitis    Kidney cysts    followed by Dr. Marlou Porch   Osteopenia    Plantar fasciitis    Spinal stenosis    Tingling    rt foot   Wears hearing aid      Allergies   Allergen Reactions   Amoxicillin-Pot Clavulanate Other (See Comments)   Levofloxacin Diarrhea   Other     Band-aid   Codeine Hives   Sulfa Antibiotics Nausea And Vomiting    ROS: all negative except above.   Physical Exam: Filed Weights   11/26/22 0900  Weight: 107 lb 3.2 oz (48.6 kg)   BP 130/78   Pulse 66   Temp 97.6 F (36.4 C)   Ht 4\' 10"  (1.473 m)   Wt 107 lb 3.2 oz (48.6 kg)   [name] 10/18/2001   SpO2 99%   BMI 22.40 kg/m  General Appearance: Well nourished, in no apparent distress. Eyes: PERRLA, EOMs, conjunctiva no swelling or erythema Sinuses: No Frontal/maxillary tenderness ENT/Mouth: Ext aud canals clear, TMs without erythema, bulging. No erythema, swelling, or exudate on post pharynx.  Tonsils not swollen or erythematous. Hearing normal.  Neck: Supple, thyroid normal.  Respiratory: Respiratory effort normal, BS equal bilaterally without rales, rhonchi, wheezing or stridor.  Cardio: RRR with no MRGs. Brisk peripheral pulses without edema.  Abdomen: Soft, + BS.  Non tender, no guarding, rebound, hernias, masses. Lymphatics: Non tender without lymphadenopathy.  Musculoskeletal: Full ROM, 5/5 strength, normal gait.  Skin: Warm, dry without rashes, lesions, ecchymosis.  Neuro: Cranial nerves intact. Normal muscle tone, no cerebellar symptoms. Sensation intact.  Psych: Awake and oriented X 3, normal affect, Insight and Judgment appropriate.     Adela Glimpse, NP 9:31 AM [facility]

## 2022-11-30 ENCOUNTER — Encounter: Payer: Self-pay | Admitting: Podiatry

## 2022-12-01 NOTE — Patient Instructions (Signed)

## 2022-12-03 DIAGNOSIS — M79641 Pain in right hand: Secondary | ICD-10-CM | POA: Diagnosis not present

## 2022-12-03 DIAGNOSIS — M79644 Pain in right finger(s): Secondary | ICD-10-CM | POA: Diagnosis not present

## 2022-12-04 ENCOUNTER — Ambulatory Visit (INDEPENDENT_AMBULATORY_CARE_PROVIDER_SITE_OTHER): Payer: PPO | Admitting: Nurse Practitioner

## 2022-12-04 ENCOUNTER — Encounter: Payer: Self-pay | Admitting: Nurse Practitioner

## 2022-12-04 ENCOUNTER — Encounter: Payer: PPO | Admitting: Nurse Practitioner

## 2022-12-04 VITALS — BP 110/68 | HR 67 | Temp 97.5°F | Ht <= 58 in | Wt 105.2 lb

## 2022-12-04 DIAGNOSIS — M79644 Pain in right finger(s): Secondary | ICD-10-CM

## 2022-12-04 DIAGNOSIS — E559 Vitamin D deficiency, unspecified: Secondary | ICD-10-CM

## 2022-12-04 DIAGNOSIS — Z1329 Encounter for screening for other suspected endocrine disorder: Secondary | ICD-10-CM

## 2022-12-04 DIAGNOSIS — Z136 Encounter for screening for cardiovascular disorders: Secondary | ICD-10-CM | POA: Diagnosis not present

## 2022-12-04 DIAGNOSIS — E782 Mixed hyperlipidemia: Secondary | ICD-10-CM

## 2022-12-04 DIAGNOSIS — I700 Atherosclerosis of aorta: Secondary | ICD-10-CM

## 2022-12-04 DIAGNOSIS — K219 Gastro-esophageal reflux disease without esophagitis: Secondary | ICD-10-CM

## 2022-12-04 DIAGNOSIS — F1090 Alcohol use, unspecified, uncomplicated: Secondary | ICD-10-CM

## 2022-12-04 DIAGNOSIS — Z1389 Encounter for screening for other disorder: Secondary | ICD-10-CM | POA: Diagnosis not present

## 2022-12-04 DIAGNOSIS — Z0001 Encounter for general adult medical examination with abnormal findings: Secondary | ICD-10-CM | POA: Diagnosis not present

## 2022-12-04 DIAGNOSIS — Z79899 Other long term (current) drug therapy: Secondary | ICD-10-CM

## 2022-12-04 DIAGNOSIS — Z789 Other specified health status: Secondary | ICD-10-CM

## 2022-12-04 DIAGNOSIS — I10 Essential (primary) hypertension: Secondary | ICD-10-CM

## 2022-12-04 DIAGNOSIS — R7309 Other abnormal glucose: Secondary | ICD-10-CM

## 2022-12-04 DIAGNOSIS — M8580 Other specified disorders of bone density and structure, unspecified site: Secondary | ICD-10-CM

## 2022-12-04 DIAGNOSIS — G608 Other hereditary and idiopathic neuropathies: Secondary | ICD-10-CM

## 2022-12-04 MED ORDER — OMEPRAZOLE 10 MG PO CPDR: 10.0000 mg | DELAYED_RELEASE_CAPSULE | Freq: Every day | ORAL | 3 refills | Status: DC

## 2022-12-04 NOTE — Progress Notes (Signed)
COMPLETE PHYSICAL EXAMINATION  Encounter for General Medical Examination with Abnormal Findings 1 year Mammogram UTD 09/22/22  Essential hypertension No medications at this time Monitor blood pressure  Continue DASH diet.   Reminder to go to the ER if any CP, SOB, nausea, dizziness, severe HA, changes vision/speech, left arm numbness and tingling and jaw pain. CBC CMP  Gastroesophageal reflux disease, esophagitis presence not specified Well controlled will try decreasing omeprazole to 10 mg every day  Discussed diet, avoiding triggers and other lifestyle changes  Osteopenia Last DEXA 07/30/20 t score -1.9 Continue Vit D, calcium and weight bearing exercises - DEXA ordered  Abnormal Glucose A1Cs well controlled in recent years Continue diet and exercise A1C  Hyperlipidemia, mixed Moderate elevations wants to continue to try lifestyle modifications before initiating medication Discussed dietary and exercise modifications Check lipids, plan to initiate med if LDL remains significantly elevated near 130 CMP   Medication management CMP   Interstitial cystitis  managing with lifestyle, follows urology  Aortic atherosclerosis (HCC) Control blood pressure, cholesterol, glucose, increase exercise.   Alcohol use Has been cutting back dramatically, using only on weekends, trying to limit Will try to cut back with therapist and using hot tea/substitue No history of DT's/withdrawals Continue therapy  Medication management -     Magnesium  Pain of right thumb Continue to follow with orthopedics  Peripheral sensory neuropathy Off gabapentin because was only taking 10 mg Continue to follow with neurology  Vitamin D deficiency -     VITAMIN D 25 Hydroxy (Vit-D Deficiency, Fractures) Continue Vit D supplementation  Screening for ischemic heart disease EKG  Screening for AAA - ABD U/S LTD RETROPERITONEAL  Screening for hematuria/proteinuria Microalbumin/creatinine  urine ratio Routine U/A with reflex microscopic  I provided 40 minutes of non-face-to-face time during this encounter including counseling, chart review, and critical decision making was preformed.   Future Appointments  Date Time Provider Department Center  12/11/2022  1:10 PM [facility] None  01/08/2023  3:55 PM Jerene Bears, MD [facility]  01/13/2023 11:30 AM Sater, Pearletha Furl, MD [facility] None  08/28/2023 10:30 AM Raynelle Dick, NP [facility] None  12/04/2023 10:00 AM Raynelle Dick, NP [facility] None      Subjective:  Helen Glover is a 72 y.o. female who presents for Medicare Annual Wellness Visit and follow up for HTN, hyperlipidemia, prediabetes, GERD and vitamin D Def.   She had removal of ingrown toenail that became infected and took doxycycline and is following with podiatry. Her toe is still painful. She has bursitis in both of her heels. She completed a course of Prednisone.   She has almost completely stopped drinking alcohol. She continues to have stress with her husband, tried marriage counseling and didn't work, continues her personal counseling  02/25/18 patient had C1-C2 posterior laminectomy and fusion by Dr Marcelle Overlie, doing well since.  She has spinal stenosis with radiculopathy followed by Dr.Jones, getting injections with immense benefit. Follows with Dr. Ranell Patrick for knee, had R knee arthroscopy 06/22/2019. She is having surgery on right hand 12/30/22 for carpal tunnel release and 3rd trigger finger Right hand on right.  MRI of right thumb  to make sure there is no cyst that needs to be addressed during surgery. Right thumb does hurt.   She is taking omeprazole 20mg  in the morning.  She is also taking a probiotic tablet daily.  She had hysterectomy and continues on low dose estradiol patch, follows with GYN.   Interstitial  cystitis - patient states mild now that lower stress since retired, managing with lifestyle only. She was having chronic  bladder infections. She is using vulvar estrogen and is doing better.   BMI is Body mass index is 21.99 kg/m., she has been working on diet and exercise. Wt Readings from Last 3 Encounters:  12/04/22 105 lb 3.2 oz (47.7 kg)  11/26/22 107 lb 3.2 oz (48.6 kg)  08/28/22 103 lb 12.8 oz (47.1 kg)   She does not check BP at home, not on any meds, today their BP is BP: 110/68 BP Readings from Last 3 Encounters:  12/04/22 110/68  11/26/22 130/78  11/17/22 129/77   She does not workout. She denies chest pain, shortness of breath, dizziness.  She is not on cholesterol medication.  Her cholesterol is not at goal. Continues to focus on diet and exercise- walking and stretching. The cholesterol last visit was:   Lab Results  Component Value Date   CHOL 213 (H) 08/28/2022   HDL 95 08/28/2022   LDLCALC 104 (H) 08/28/2022   TRIG 56 08/28/2022   CHOLHDL 2.2 08/28/2022   She has been working on diet and exercise for hx of prediabetes (A1C 5.7% in 2014), and denies hyperglycemia, hypoglycemia , increased appetite, nausea, paresthesia of the feet, polydipsia, polyuria, visual disturbances, vomiting and weight loss. Last A1C in the office was:  Lab Results  Component Value Date   HGBA1C 5.4 11/21/2021   Last GFR Lab Results  Component Value Date   EGFR 95 08/28/2022    Patient is on Vitamin D supplement.   Lab Results  Component Value Date   VD25OH 66 11/21/2021      Medication Review:  Current Outpatient Medications (Endocrine & Metabolic):    estradiol (VIVELLE-DOT) 0.025 MG/24HR, Place 1 patch onto the skin 2 (two) times a week.   methylPREDNISolone (MEDROL DOSEPAK) 4 MG TBPK tablet, Take as directed (Patient not taking: Reported on 12/04/2022)    Current Outpatient Medications (Analgesics):    meloxicam (MOBIC) 15 MG tablet, Take  1/2 to 1 tablet  Daily  with Food  for Pain & Inflammation  Current Outpatient Medications (Hematological):    vitamin B-12 (CYANOCOBALAMIN) 100 MCG  tablet, Take 1,000 mcg by mouth daily. Every other day  Current Outpatient Medications (Other):    Ascorbic Acid (VITAMIN C) 100 MG tablet, Take 1,000 mg by mouth daily.   Biotin 1 MG CAPS, Take 1 capsule by mouth daily.    calcium carbonate (OS-CAL - DOSED IN MG OF ELEMENTAL CALCIUM) 1250 (500 Ca) MG tablet, Take 1 tablet by mouth daily with breakfast.   cholecalciferol (VITAMIN D) 1000 UNITS tablet, Take 3,000 Units by mouth daily.    estradiol (ESTRACE) 0.1 MG/GM vaginal cream, SMARTSIG:0.5 Gram(s) Vaginal Every Night   hydrocortisone (ANUSOL-HC) 2.5 % rectal cream, Place 1 Application rectally 2 (two) times daily.   L-Arginine 1000 MG TABS, Take 1 tablet by mouth daily.   omeprazole (PRILOSEC) 20 MG capsule, Take  1 capsule  Daily  to Prevent Heartburn & Indigestion  (Dx:  k21.9  )   Probiotic Product (PROBIOTIC PO), Take 1 tablet by mouth daily.   Pyridoxine HCl (B-6 PO), Take by mouth.  Allergies: Allergies  Allergen Reactions   Amoxicillin-Pot Clavulanate Other (See Comments)   Levofloxacin Diarrhea   Other     Band-aid   Codeine Hives   Sulfa Antibiotics Nausea And Vomiting    Current Problems (verified) has Interstitial cystitis; GERD (gastroesophageal reflux disease); Hyperlipidemia;  Hypertension; History of prediabetes; Vitamin D deficiency; Aortic atherosclerosis (HCC); Lumbar back pain with radiculopathy affecting right lower extremity; Osteopenia; and Hemorrhoids on their problem list.  Screening Tests Health Maintenance  Topic Date Due   COVID-19 Vaccine (9 - 2023-24 season) 12/18/2022   Colonoscopy  07/14/2023   Medicare Annual Wellness (AWV)  08/28/2023   MAMMOGRAM  09/22/2023   Pneumonia Vaccine 81+ Years old  Completed   INFLUENZA VACCINE  Completed   DEXA SCAN  Completed   Hepatitis C Screening  Completed   Zoster Vaccines- Shingrix  Completed   HPV VACCINES  Aged Out   DTaP/Tdap/Td  Discontinued   Health Maintenance  Topic Date Due   COVID-19 Vaccine  (9 - 2023-24 season) 12/18/2022   Colonoscopy  07/14/2023   Medicare Annual Wellness (AWV)  08/28/2023   MAMMOGRAM  09/22/2023   Pneumonia Vaccine 36+ Years old  Completed   INFLUENZA VACCINE  Completed   DEXA SCAN  Completed   Hepatitis C Screening  Completed   Zoster Vaccines- Shingrix  Completed   HPV VACCINES  Aged Out   DTaP/Tdap/Td  Discontinued     Names of Other Physician/Practitioners you currently use: 1. [facility] here for primary care 2. Salvatore Marvel, eye doctor, last visit 2023- has appointment coming up 3. Dr. Toni Arthurs, dentist, last visit 09/2022  Patient Care Team: Lucky Cowboy, MD as PCP - General (Internal Medicine) Jerene Bears, MD as PCP - OBGYN (Obstetrics and Gynecology) Antonietta Barcelona, OD as Referring Physician (Optometry) August Saucer, Corrie Mckusick, MD as Consulting Physician (Orthopedic Surgery) Sanjuana Letters, MD as Referring Physician (Orthopedic Surgery) Jerene Bears, MD as Consulting Physician (Gynecology) Crist Fat, MD as Attending Physician (Urology) Newman Pies, MD as Consulting Physician (Otolaryngology) Willis Modena, MD as Consulting Physician (Gastroenterology) Charlett Nose, [facility] (Inactive) as Pharmacist (Pharmacist)  Surgical: She  has a past surgical history that includes Cesarean section; Myringotomy; Lumbar laminectomy; Carpal tunnel release; Trigger finger release; Anal sphincterotomy; Nasal sinus surgery; Abdominal hysterectomy (2003); Hemorrhoid surgery (N/A, 1998); Cystoscopy; Tympanostomy tube placement; Tonsillectomy; Tonsillectomy (10/14); Lumbar laminectomy/decompression microdiscectomy (N/A, 08/09/2014); Cervical fusion (02/25/2018); and Knee arthroscopy (Right, 06/22/2019). Family Her family history includes Diabetes in her mother; Hyperlipidemia in her brother and mother; Hypertension in her brother and mother; Osteoporosis in her mother; Prostate cancer in her father. Social  history  She reports that she quit smoking about 36 years ago. Her smoking use included cigarettes. She started smoking about 51 years ago. She has a 7.5 pack-year smoking history. She has never used smokeless tobacco. She reports current alcohol use of about 6.0 - 7.0 standard drinks of alcohol per week. She reports that she does not use drugs.  Review of Systems  Constitutional:  Positive for malaise/fatigue. Negative for chills and fever.  HENT:  Negative for congestion, hearing loss, sinus pain, sore throat and tinnitus.   Eyes:  Negative for blurred vision and double vision.  Respiratory:  Negative for cough, hemoptysis, sputum production, shortness of breath and wheezing.   Cardiovascular:  Negative for chest pain, palpitations and leg swelling.  Gastrointestinal:  Positive for heartburn (uses omeprazole). Negative for abdominal pain, constipation, diarrhea, nausea and vomiting.       Had loose stools which is improving  Genitourinary:  Negative for dysuria and urgency.       Chronic bladder infections improved with topical estrogen  Musculoskeletal:  Positive for back pain and joint pain (right wrist). Negative for falls, myalgias and neck  pain.  Skin:  Negative for rash.  Neurological:  Positive for tingling (feet bilaterally). Negative for dizziness, tremors, weakness and headaches.  Endo/Heme/Allergies:  Does not bruise/bleed easily.  Psychiatric/Behavioral:  Negative for depression and suicidal ideas. The patient is not nervous/anxious and does not have insomnia.     Objective:   Today's Vitals   12/04/22 1013  BP: 110/68  Pulse: 67  Temp: (!) 97.5 F (36.4 C)  SpO2: 96%  Weight: 105 lb 3.2 oz (47.7 kg)  Height: 4\' 10"  (1.473 m)     Body mass index is 21.99 kg/m.  General appearance: alert, no distress, WD/WN, female HEENT: normocephalic, sclerae anicteric, right TM no bulging, left TM with perforation, nares patent, no discharge or erythema, pharynx normal Oral  cavity: MMM, no lesions Sinuses: right maxillary tenderness Neck: supple, no lymphadenopathy, no thyromegaly, no masses Heart: RRR, normal S1, S2, no murmurs Lungs: CTA bilaterally, no wheezes, rhonchi, or rales Breast: defer to GYN Abdomen: +bs, soft, non tender, non distended, no masses, no hepatomegaly, no splenomegaly Musculoskeletal: nontender, no swelling, no obvious deformity, right 3rd finger Trigger finger   Extremities: no edema, no cyanosis, no clubbing Skin: warm and dry, no lesion or signs of infection Pulses: 2+ symmetric, upper and lower extremities, normal cap refill Neurological: alert, oriented x 3, CN2-12 intact, strength normal upper extremities and lower extremities, sensation slightly diminished of toes, DTRs 2+ throughout, no cerebellar signs, mildly antalgic normal GU: defer to GYN Psychiatric: normal affect, behavior normal, pleasant  EKG: Sinus bradycardia, IRBBB, no ST changes AAA: < 3 cm   Thong Feeny Hollie Salk, NP 10:27 AM [facility]

## 2022-12-04 NOTE — Patient Instructions (Signed)

## 2022-12-05 LAB — CBC WITH DIFFERENTIAL/PLATELET
Absolute Lymphocytes: 1747 {cells}/uL (ref 850–3900)
Absolute Monocytes: 554 {cells}/uL (ref 200–950)
Basophils Absolute: 50 {cells}/uL (ref 0–200)
Basophils Relative: 0.7 %
Eosinophils Absolute: 121 {cells}/uL (ref 15–500)
Eosinophils Relative: 1.7 %
HCT: 38.3 % (ref 35.0–45.0)
Hemoglobin: 12.7 g/dL (ref 11.7–15.5)
MCH: 32.1 pg (ref 27.0–33.0)
MCHC: 33.2 g/dL (ref 32.0–36.0)
MCV: 96.7 fL (ref 80.0–100.0)
MPV: 11.6 fL (ref 7.5–12.5)
Monocytes Relative: 7.8 %
Neutro Abs: 4629 {cells}/uL (ref 1500–7800)
Neutrophils Relative %: 65.2 %
Platelets: 271 10*3/uL (ref 140–400)
RBC: 3.96 10*6/uL (ref 3.80–5.10)
RDW: 11.8 % (ref 11.0–15.0)
Total Lymphocyte: 24.6 %
WBC: 7.1 10*3/uL (ref 3.8–10.8)

## 2022-12-05 LAB — COMPLETE METABOLIC PANEL WITH GFR
AG Ratio: 2 (calc) (ref 1.0–2.5)
ALT: 15 U/L (ref 6–29)
AST: 18 U/L (ref 10–35)
Albumin: 4.5 g/dL (ref 3.6–5.1)
Alkaline phosphatase (APISO): 61 U/L (ref 37–153)
BUN: 13 mg/dL (ref 7–25)
CO2: 27 mmol/L (ref 20–32)
Calcium: 9.4 mg/dL (ref 8.6–10.4)
Chloride: 101 mmol/L (ref 98–110)
Creat: 0.62 mg/dL (ref 0.60–1.00)
Globulin: 2.2 g/dL (ref 1.9–3.7)
Glucose, Bld: 80 mg/dL (ref 65–99)
Potassium: 4.3 mmol/L (ref 3.5–5.3)
Sodium: 139 mmol/L (ref 135–146)
Total Bilirubin: 0.4 mg/dL (ref 0.2–1.2)
Total Protein: 6.7 g/dL (ref 6.1–8.1)
eGFR: 95 mL/min/{1.73_m2} (ref >=60)

## 2022-12-05 LAB — URINALYSIS, ROUTINE W REFLEX MICROSCOPIC
Bacteria, UA: NONE SEEN /[HPF]
Bilirubin Urine: NEGATIVE
Glucose, UA: NEGATIVE
Hgb urine dipstick: NEGATIVE
Hyaline Cast: NONE SEEN /[LPF]
Ketones, ur: NEGATIVE
Nitrite: NEGATIVE
Protein, ur: NEGATIVE
RBC / HPF: NONE SEEN /[HPF] (ref 0–2)
Specific Gravity, Urine: 1.016 (ref 1.001–1.035)
pH: 6 (ref 5.0–8.0)

## 2022-12-05 LAB — MICROALBUMIN / CREATININE URINE RATIO
Creatinine, Urine: 92 mg/dL (ref 20–275)
Microalb Creat Ratio: 4 mg/g{creat} (ref <30)
Microalb, Ur: 0.4 mg/dL

## 2022-12-05 LAB — MAGNESIUM: Magnesium: 2 mg/dL (ref 1.5–2.5)

## 2022-12-05 LAB — TSH: TSH: 2.02 m[IU]/L (ref 0.40–4.50)

## 2022-12-05 LAB — LIPID PANEL
Cholesterol: 218 mg/dL — ABNORMAL HIGH (ref <200)
HDL: 92 mg/dL (ref >=50)
LDL Cholesterol (Calc): 110 mg/dL — ABNORMAL HIGH
Non-HDL Cholesterol (Calc): 126 mg/dL (ref <130)
Total CHOL/HDL Ratio: 2.4 (calc) (ref <5.0)
Triglycerides: 70 mg/dL (ref <150)

## 2022-12-05 LAB — VITAMIN D 25 HYDROXY (VIT D DEFICIENCY, FRACTURES): Vit D, 25-Hydroxy: 67 ng/mL (ref 30–100)

## 2022-12-05 LAB — URIC ACID: Uric Acid, Serum: 3 mg/dL (ref 2.5–7.0)

## 2022-12-05 LAB — HEMOGLOBIN A1C W/OUT EAG: Hgb A1c MFr Bld: 5.5 %{Hb} (ref <5.7)

## 2022-12-05 LAB — MICROSCOPIC MESSAGE

## 2022-12-07 DIAGNOSIS — M79644 Pain in right finger(s): Secondary | ICD-10-CM | POA: Diagnosis not present

## 2022-12-11 ENCOUNTER — Encounter (INDEPENDENT_AMBULATORY_CARE_PROVIDER_SITE_OTHER): Payer: Self-pay

## 2022-12-11 ENCOUNTER — Other Ambulatory Visit: Payer: Self-pay | Admitting: Internal Medicine

## 2022-12-11 ENCOUNTER — Ambulatory Visit (INDEPENDENT_AMBULATORY_CARE_PROVIDER_SITE_OTHER): Payer: PPO | Admitting: Otolaryngology

## 2022-12-11 ENCOUNTER — Encounter: Payer: Self-pay | Admitting: Podiatry

## 2022-12-11 ENCOUNTER — Ambulatory Visit (INDEPENDENT_AMBULATORY_CARE_PROVIDER_SITE_OTHER): Payer: PPO | Admitting: Audiology

## 2022-12-11 VITALS — Ht <= 58 in | Wt 106.0 lb

## 2022-12-11 DIAGNOSIS — J310 Chronic rhinitis: Secondary | ICD-10-CM | POA: Diagnosis not present

## 2022-12-11 DIAGNOSIS — R0981 Nasal congestion: Secondary | ICD-10-CM | POA: Diagnosis not present

## 2022-12-11 DIAGNOSIS — H903 Sensorineural hearing loss, bilateral: Secondary | ICD-10-CM

## 2022-12-11 DIAGNOSIS — H7202 Central perforation of tympanic membrane, left ear: Secondary | ICD-10-CM | POA: Diagnosis not present

## 2022-12-11 DIAGNOSIS — R0982 Postnasal drip: Secondary | ICD-10-CM

## 2022-12-11 DIAGNOSIS — K219 Gastro-esophageal reflux disease without esophagitis: Secondary | ICD-10-CM

## 2022-12-11 NOTE — Progress Notes (Signed)
  [address], [address] [place], [place] [address] [phone]  Audiological Evaluation    Name: Helen Glover     DOB:   1951-01-13      MRN:   [id number]                                                                                     Service Date: 12/11/2022        Patient was referred today for a hearing evaluation by Dr. Karle Barr.   Symptoms Yes Details  Hearing loss  [x]  Patient reported perceiving hearing loss in both ears.  Tinnitus  [x]  Patient reported experiencing tinnitus on rare occasions.  Balance problems  []  Patient denied vertigo sensations.  Amplification  [x]  Patient reported the use of hearing aids.    Tympanogram: Right ear: Normal external ear canal volume with normal middle ear pressure and tympanic membrane compliance (Type A). Left ear: Could not obtain seal; middle ear status is unable to be determined at this time.    Hearing Evaluation: The audiogram was completed using conventional audiometric techniques under headphones with good reliability.   The hearing test results indicate: Right ear: Normal hearing sensitivity from [phone] Hz sloping to moderate sensorineural hearing loss from 2000-8000 Hz. Left ear: Mild sensorineural hearing loss from 250-750 Hz sloping to moderately-severe sensorineural hearing loss from 1000-8000 Hz.  Speech Audiometry: Right ear- Speech Reception Threshold (SRT) was obtained at 25 dBHL. Left ear- Speech Reception Threshold (SRT) was obtained at 40 dBHL masked.   Word Recognition Score Tested using NU-6 (MLV) Right ear: 84% was obtained at a presentation level of 65 dBHL which is deemed as good understanding. Left ear: 92% was obtained at a presentation level of 75 dBHL with contralateral masking which is deemed as good understanding.   Recommendations: Repeat audiogram when changes are perceived or per MD. Continue use of hearing aids.   Conley Rolls Canary Fister, AUD, CCC-A 12/11/22

## 2022-12-14 DIAGNOSIS — R0982 Postnasal drip: Secondary | ICD-10-CM | POA: Insufficient documentation

## 2022-12-14 DIAGNOSIS — H7202 Central perforation of tympanic membrane, left ear: Secondary | ICD-10-CM | POA: Insufficient documentation

## 2022-12-14 DIAGNOSIS — H903 Sensorineural hearing loss, bilateral: Secondary | ICD-10-CM | POA: Insufficient documentation

## 2022-12-14 DIAGNOSIS — J310 Chronic rhinitis: Secondary | ICD-10-CM | POA: Insufficient documentation

## 2022-12-14 NOTE — Progress Notes (Signed)
Patient ID: Helen Glover, female   DOB: 1950/08/25, 72 y.o.   MRN: [id number]  CC: Hearing loss, left tympanic membrane perforation, postnasal drainage  HPI: The patient is a 72 year old female who returns today for her follow-up evaluation. The patient has a history of bilateral eustachian tube dysfunction, status post bilateral myringotomy and tube placement.  Both tubes have since extruded.  At her last visit, she was noted to have a left dry tympanic membrane perforation.  The right tympanic membrane was intact and mobile.  She was also noted to have bilateral sensorineural hearing loss.  She was fitted with bilateral hearing aids.  The patient returns today reporting no difficulty with her hearing or her ears.  However, she has noted persistent nasal drainage.  She cannot tolerate the use of Atrovent nasal spray due to the side effects.  She denies any recent otitis media or otitis externa.  She complains that her hearing loss may have worsened.  She denies any otalgia or otorrhea.  Exam: General: Communicates without difficulty, well nourished, no acute distress. Head: Normocephalic, no evidence injury, no tenderness, facial buttresses intact without stepoff. Face/sinus: No tenderness to palpation and percussion. Facial movement is normal and symmetric. Eyes: PERRL, EOMI. No scleral icterus, conjunctivae clear. Neuro: CN II exam reveals vision grossly intact. No nystagmus at any point of gaze. Ears: Auricles well formed without lesions.  The right tympanic membrane is intact and mobile.  The left tympanic membrane continues to have an inferior perforation.  No drainage is noted in either ear. Nose: External evaluation reveals normal support and skin without lesions. Dorsum is intact. Anterior rhinoscopy reveals congested mucosa over anterior aspect of inferior turbinates and intact septum. No purulence noted. Oral:  Oral cavity and oropharynx are intact, symmetric, without erythema or edema. Mucosa is  moist without lesions. Neck: Full range of motion without pain. There is no significant lymphadenopathy. No masses palpable. Thyroid bed within normal limits to palpation. Parotid glands and submandibular glands equal bilaterally without mass. Trachea is midline. Neuro:  CN 2-12 grossly intact. Gait normal.   Procedure:  Flexible Nasal Endoscopy: Description: Risks, benefits, and alternatives of flexible endoscopy were explained to the patient.  Specific mention was made of the risk of throat numbness with difficulty swallowing, possible bleeding from the nose and mouth, and pain from the procedure.  The patient gave oral consent to proceed.  The flexible scope was inserted into the right nasal cavity.  Endoscopy of the interior nasal cavity, superior, inferior, and middle meatus was performed. The sphenoid-ethmoid recess was examined. Edematous mucosa was noted.  No polyp, mass, or lesion was appreciated. Olfactory cleft was clear.  Nasopharynx with postnasal drainage.  Turbinates were hypertrophied but without mass.  The procedure was repeated on the contralateral side with similar findings.  The patient tolerated the procedure well.   AUDIOMETRIC TESTING:  I reviewed the audiometric result. The test shows stable bilateral moderate sensorineural hearing loss, slightly worse on the left.  The speech reception threshold is 25dB AD and 40dB AS. The discrimination score is 84% AD and 92% AS.  The tympanogram is normal on the right.  Assessment  1.  Chronic/allergic rhinitis, with nasal mucosal congestion and postnasal drainage.  No polyps, mass, lesion, or purulent drainage is noted. 2.  Stable bilateral sensorineural hearing loss, slightly worse on the left. 3.  The patient continues to have a dry left tympanic membrane perforation.  Plan note 1.  The physical exam and nasal endoscopy findings  are reviewed with the patient.  The hearing test results also reviewed. 2.  Nasal saline irrigation daily. 3.   Continue the use of her hearing aids. 4.  Dry ear precautions on the left side. 5.  The patient will return for reevaluation in 6 months.

## 2022-12-16 ENCOUNTER — Encounter: Payer: Self-pay | Admitting: Podiatry

## 2022-12-16 ENCOUNTER — Ambulatory Visit: Payer: PPO | Admitting: Podiatry

## 2022-12-16 DIAGNOSIS — L603 Nail dystrophy: Secondary | ICD-10-CM

## 2022-12-16 DIAGNOSIS — M7661 Achilles tendinitis, right leg: Secondary | ICD-10-CM | POA: Diagnosis not present

## 2022-12-16 DIAGNOSIS — M7662 Achilles tendinitis, left leg: Secondary | ICD-10-CM

## 2022-12-16 NOTE — Progress Notes (Signed)
Subjective: Chief Complaint  Patient presents with   Toe Pain    PATIENT STATES THAT HER RIGHT 3RD TOE HAS BEEN HURTING HER , PATIENT STATES SHE TAKES A ANTIINFLAMMATORY FOR HER BACK WHICH HELPS WITH  THE PAIN IN HER FOOT.  PATIENT WANTS TO ASK ABOUT HER HEEL PAIN IN BILATERAL FEET ON BACK SIDES   72 year old female presents the above concerns.  She states that last week she was having quite a bit of pain to her right third toe which is continue so that she thinks is getting better.  Noted approximately swelling or redness at this time.  She also has concerns of the back of her heel.  She states that she previously on steroids and it helped the inflammation quite a bit but she has discomfort with supine in bed the back of her heels.  No injuries.  Objective: AAO x3, NAD DP/PT pulses palpable bilaterally, CRT less than 3 seconds On the right second toe the nail is still dystrophic.  There is no sign of edema, erythema appears manage there is no nail there is no signs of infection.  Mild discomfort on the toe.   There is tenderness palpation on the posterior aspect calcaneus on insertion of Achilles tendon into the calcaneus.  No edema, erythema.  Clinically obtained appears to be intact.  MMT/strength. No pain with calf compression, swelling, warmth, erythema  Assessment: 72 year old female with insertional Achilles tendinitis bilaterally, right second toe ingrown toenail/pain  Plan: -All treatment options discussed with the patient including all alternatives, risks, complications.  -The right second toe we discussed partial avulsion toenail procedure symptoms persist however her symptoms seem to be improving slowly.  Will continue with soaking in Epsom salts.  Discussed antibiotic ointment dressing changes.  Monitor any signs or symptoms of infection. -Regards to the posterior heel.  Discussed stretching, icing.  Close Voltaren gel.  Heel left.  Dispensed a gel heel pad.  Reviewed prior x-rays  with her.  No significant posterior spur. -Patient encouraged to call the office with any questions, concerns, change in symptoms.   Vivi Barrack DPM

## 2022-12-16 NOTE — Patient Instructions (Signed)
Soak Instructions    THE DAY AFTER THE PROCEDURE  Place 1/4 cup of epsom salts in a quart of warm tap water.  Submerge your foot or feet with outer bandage intact for the initial soak; this will allow the bandage to become moist and wet for easy lift off.  Once you remove your bandage, continue to soak in the solution for 20 minutes.  This soak should be done twice a day.  Next, remove your foot or feet from solution, blot dry the affected area and cover.  You may use a band aid large enough to cover the area or use gauze and tape.  Apply other medications to the area as directed by the doctor such as polysporin neosporin.  IF YOUR SKIN BECOMES IRRITATED WHILE USING THESE INSTRUCTIONS, IT IS OKAY TO SWITCH TO  WHITE VINEGAR AND WATER. Or you may use antibacterial soap and water to keep the toe clean  Monitor for any signs/symptoms of infection. Call the office immediately if any occur or go directly to the emergency room. Call with any questions/concerns.   --  Achilles Tendinitis  with Rehab Achilles tendinitis is a disorder of the Achilles tendon. The Achilles tendon connects the large calf muscles (Gastrocnemius and Soleus) to the heel bone (calcaneus). This tendon is sometimes called the heel cord. It is important for pushing-off and standing on your toes and is important for walking, running, or jumping. Tendinitis is often caused by overuse and repetitive microtrauma. SYMPTOMS Pain, tenderness, swelling, warmth, and redness may occur over the Achilles tendon even at rest. Pain with pushing off, or flexing or extending the ankle. Pain that is worsened after or during activity. CAUSES  Overuse sometimes seen with rapid increase in exercise programs or in sports requiring running and jumping. Poor physical conditioning (strength and flexibility or endurance). Running sports, especially training running down hills. Inadequate warm-up before practice or play or failure to stretch before  participation. Injury to the tendon. PREVENTION  Warm up and stretch before practice or competition. Allow time for adequate rest and recovery between practices and competition. Keep up conditioning. Keep up ankle and leg flexibility. Improve or keep muscle strength and endurance. Improve cardiovascular fitness. Use proper technique. Use proper equipment (shoes, skates). To help prevent recurrence, taping, protective strapping, or an adhesive bandage may be recommended for several weeks after healing is complete. PROGNOSIS  Recovery may take weeks to several months to heal. Longer recovery is expected if symptoms have been prolonged. Recovery is usually quicker if the inflammation is due to a direct blow as compared with overuse or sudden strain. RELATED COMPLICATIONS  Healing time will be prolonged if the condition is not correctly treated. The injury must be given plenty of time to heal. Symptoms can reoccur if activity is resumed too soon. Untreated, tendinitis may increase the risk of tendon rupture requiring additional time for recovery and possibly surgery. TREATMENT  The first treatment consists of rest anti-inflammatory medication, and ice to relieve the pain. Stretching and strengthening exercises after resolution of pain will likely help reduce the risk of recurrence. Referral to a physical therapist or athletic trainer for further evaluation and treatment may be helpful. A walking boot or cast may be recommended to rest the Achilles tendon. This can help break the cycle of inflammation and microtrauma. Arch supports (orthotics) may be prescribed or recommended by your caregiver as an adjunct to therapy and rest. Surgery to remove the inflamed tendon lining or degenerated tendon tissue is rarely  necessary and has shown less than predictable results. MEDICATION  Nonsteroidal anti-inflammatory medications, such as aspirin and ibuprofen, may be used for pain and inflammation relief.  Do not take within 7 days before surgery. Take these as directed by your caregiver. Contact your caregiver immediately if any bleeding, stomach upset, or signs of allergic reaction occur. Other minor pain relievers, such as acetaminophen, may also be used. Pain relievers may be prescribed as necessary by your caregiver. Do not take prescription pain medication for longer than 4 to 7 days. Use only as directed and only as much as you need. Cortisone injections are rarely indicated. Cortisone injections may weaken tendons and predispose to rupture. It is better to give the condition more time to heal than to use them. HEAT AND COLD Cold is used to relieve pain and reduce inflammation for acute and chronic Achilles tendinitis. Cold should be applied for 10 to 15 minutes every 2 to 3 hours for inflammation and pain and immediately after any activity that aggravates your symptoms. Use ice packs or an ice massage. Heat may be used before performing stretching and strengthening activities prescribed by your caregiver. Use a heat pack or a warm soak. SEEK MEDICAL CARE IF: Symptoms get worse or do not improve in 2 weeks despite treatment. New, unexplained symptoms develop. Drugs used in treatment may produce side effects.  EXERCISES:  RANGE OF MOTION (ROM) AND STRETCHING EXERCISES - Achilles Tendinitis  These exercises may help you when beginning to rehabilitate your injury. Your symptoms may resolve with or without further involvement from your physician, physical therapist or athletic trainer. While completing these exercises, remember:  Restoring tissue flexibility helps normal motion to return to the joints. This allows healthier, less painful movement and activity. An effective stretch should be held for at least 30 seconds. A stretch should never be painful. You should only feel a gentle lengthening or release in the stretched tissue.  STRETCH  Gastroc, Standing  Place hands on wall. Extend right /  left leg, keeping the front knee somewhat bent. Slightly point your toes inward on your back foot. Keeping your right / left heel on the floor and your knee straight, shift your weight toward the wall, not allowing your back to arch. You should feel a gentle stretch in the right / left calf. Hold this position for 10 seconds. Repeat 3 times. Complete this stretch 2 times per day.  STRETCH  Soleus, Standing  Place hands on wall. Extend right / left leg, keeping the other knee somewhat bent. Slightly point your toes inward on your back foot. Keep your right / left heel on the floor, bend your back knee, and slightly shift your weight over the back leg so that you feel a gentle stretch deep in your back calf. Hold this position for 10 seconds. Repeat 3 times. Complete this stretch 2 times per day.  STRETCH  Gastrocsoleus, Standing  Note: This exercise can place a lot of stress on your foot and ankle. Please complete this exercise only if specifically instructed by your caregiver.  Place the ball of your right / left foot on a step, keeping your other foot firmly on the same step. Hold on to the wall or a rail for balance. Slowly lift your other foot, allowing your body weight to press your heel down over the edge of the step. You should feel a stretch in your right / left calf. Hold this position for 10 seconds. Repeat this exercise with a slight  bend in your knee. Repeat 3 times. Complete this stretch 2 times per day.   STRENGTHENING EXERCISES - Achilles Tendinitis These exercises may help you when beginning to rehabilitate your injury. They may resolve your symptoms with or without further involvement from your physician, physical therapist or athletic trainer. While completing these exercises, remember:  Muscles can gain both the endurance and the strength needed for everyday activities through controlled exercises. Complete these exercises as instructed by your physician, physical  therapist or athletic trainer. Progress the resistance and repetitions only as guided. You may experience muscle soreness or fatigue, but the pain or discomfort you are trying to eliminate should never worsen during these exercises. If this pain does worsen, stop and make certain you are following the directions exactly. If the pain is still present after adjustments, discontinue the exercise until you can discuss the trouble with your clinician.  STRENGTH - Plantar-flexors  Sit with your right / left leg extended. Holding onto both ends of a rubber exercise band/tubing, loop it around the ball of your foot. Keep a slight tension in the band. Slowly push your toes away from you, pointing them downward. Hold this position for 10 seconds. Return slowly, controlling the tension in the band/tubing. Repeat 3 times. Complete this exercise 2 times per day.   STRENGTH - Plantar-flexors  Stand with your feet shoulder width apart. Steady yourself with a wall or table using as little support as needed. Keeping your weight evenly spread over the width of your feet, rise up on your toes.* Hold this position for 10 seconds. Repeat 3 times. Complete this exercise 2 times per day.  *If this is too easy, shift your weight toward your right / left leg until you feel challenged. Ultimately, you may be asked to do this exercise with your right / left foot only.  STRENGTH  Plantar-flexors, Eccentric  Note: This exercise can place a lot of stress on your foot and ankle. Please complete this exercise only if specifically instructed by your caregiver.  Place the balls of your feet on a step. With your hands, use only enough support from a wall or rail to keep your balance. Keep your knees straight and rise up on your toes. Slowly shift your weight entirely to your right / left toes and pick up your opposite foot. Gently and with controlled movement, lower your weight through your right / left foot so that your heel  drops below the level of the step. You will feel a slight stretch in the back of your calf at the end position. Use the healthy leg to help rise up onto the balls of both feet, then lower weight only on the right / left leg again. Build up to 15 repetitions. Then progress to 3 consecutive sets of 15 repetitions.* After completing the above exercise, complete the same exercise with a slight knee bend (about 30 degrees). Again, build up to 15 repetitions. Then progress to 3 consecutive sets of 15 repetitions.* Perform this exercise 2 times per day.  *When you easily complete 3 sets of 15, your physician, physical therapist or athletic trainer may advise you to add resistance by wearing a backpack filled with additional weight.  STRENGTH - Plantar Flexors, Seated  Sit on a chair that allows your feet to rest flat on the ground. If necessary, sit at the edge of the chair. Keeping your toes firmly on the ground, lift your right / left heel as far as you can without increasing  any discomfort in your ankle. Repeat 3 times. Complete this exercise 2 times a day.

## 2022-12-21 ENCOUNTER — Encounter: Payer: Self-pay | Admitting: Nurse Practitioner

## 2022-12-22 DIAGNOSIS — L814 Other melanin hyperpigmentation: Secondary | ICD-10-CM | POA: Diagnosis not present

## 2022-12-22 DIAGNOSIS — D2372 Other benign neoplasm of skin of left lower limb, including hip: Secondary | ICD-10-CM | POA: Diagnosis not present

## 2022-12-22 DIAGNOSIS — D692 Other nonthrombocytopenic purpura: Secondary | ICD-10-CM | POA: Diagnosis not present

## 2022-12-22 DIAGNOSIS — D1801 Hemangioma of skin and subcutaneous tissue: Secondary | ICD-10-CM | POA: Diagnosis not present

## 2022-12-22 DIAGNOSIS — L821 Other seborrheic keratosis: Secondary | ICD-10-CM | POA: Diagnosis not present

## 2022-12-29 ENCOUNTER — Ambulatory Visit: Payer: PPO | Admitting: Podiatry

## 2022-12-30 DIAGNOSIS — M868X4 Other osteomyelitis, hand: Secondary | ICD-10-CM | POA: Diagnosis not present

## 2022-12-30 DIAGNOSIS — M79641 Pain in right hand: Secondary | ICD-10-CM | POA: Diagnosis not present

## 2022-12-30 DIAGNOSIS — M869 Osteomyelitis, unspecified: Secondary | ICD-10-CM | POA: Diagnosis not present

## 2022-12-31 ENCOUNTER — Encounter: Payer: Self-pay | Admitting: Internal Medicine

## 2023-01-03 ENCOUNTER — Other Ambulatory Visit (HOSPITAL_BASED_OUTPATIENT_CLINIC_OR_DEPARTMENT_OTHER): Payer: Self-pay | Admitting: Obstetrics & Gynecology

## 2023-01-03 DIAGNOSIS — Z79890 Hormone replacement therapy (postmenopausal): Secondary | ICD-10-CM

## 2023-01-08 ENCOUNTER — Ambulatory Visit (HOSPITAL_BASED_OUTPATIENT_CLINIC_OR_DEPARTMENT_OTHER): Payer: PPO | Admitting: Obstetrics & Gynecology

## 2023-01-08 NOTE — Progress Notes (Signed)
72 y.o. [id number] Married White or Caucasian female here for breast and pelvic exam.  I am also following her for h/o recurrent vaginitis issues with BV as well as recurrent UTIs.  She does have a hx IC so this complicates the decision making for when she has dysuria.    Had cellulits in her finger in September.  This just kept on as an issue as she had surgery planned.  MRI was ordered showing concerns for osteomyelitis.  She ended up having a bone biopsy and this did not show osteomyelitis.    Denies vaginal bleeding.  She separated this year.  She has lost about 13 pounds.  She isn't drinking any more and she reports when she drank, she would eat in excess.  Feels like she is eating less overall as well.   Patient's last menstrual period was 10/18/2001.          Sexually active: No.  H/O STD:  no  Health Maintenance: PCP:  Dr Kathryne Sharper office.  Last wellness appt was 11/2022.  Did blood work at that appt:  yes Vaccines are up to date:  yes Colonoscopy:  07/14/2018, f/u 5 years MMG:  09/22/2022 Normal BMD:  07/30/2020 Last pap smear:  not indicated      reports that she quit smoking about 36 years ago. Her smoking use included cigarettes. She started smoking about 51 years ago. She has a 7.5 pack-year smoking history. She has never used smokeless tobacco. She reports current alcohol use of about 6.0 - 7.0 standard drinks of alcohol per week. She reports that she does not use drugs.  Past Medical History:  Diagnosis Date   Anxiety    Arthritis    in neck   Bursitis of right hip    Carpal tunnel syndrome, bilateral    rt wrist   Complete rotator cuff tear of left shoulder    Eagle's syndrome    GERD (gastroesophageal reflux disease)    Hearing loss of both ears 12/25/2014   Has hearing aids/tubes    Heart murmur    as child   HNP (herniated nucleus pulposus)    Ingrown toenail of right foot 2023   Interstitial cystitis    Kidney cysts    followed by Dr. Marlou Porch   Osteopenia     Plantar fasciitis    Spinal stenosis    Tingling    rt foot   Wears hearing aid     Past Surgical History:  Procedure Laterality Date   ABDOMINAL HYSTERECTOMY  2003   BSO   ANAL SPHINCTEROTOMY     CARPAL TUNNEL RELEASE     LEFT   CERVICAL FUSION  02/25/2018   c1-c2 with laminectomy   CESAREAN SECTION     CYSTOSCOPY     HEMORRHOID SURGERY N/A 1998   KNEE ARTHROSCOPY Right 06/22/2019   Dr. Ranell Patrick    LUMBAR LAMINECTOMY     LUMBAR LAMINECTOMY/DECOMPRESSION MICRODISCECTOMY N/A 08/09/2014   Procedure: MICRO LUMBAR DECOMPRESSION L3-4,  REDO DECOMPRESSION, MICRODISCECTOMY L4-5, FORAMINOTOMY L3-L4, L4-L5 BILATERAL;  Surgeon: Jene Every, MD;  Location: [facility];  Service: Orthopedics;  Laterality: N/A;   MYRINGOTOMY     NASAL SINUS SURGERY     TONSILLECTOMY     LEFT only, due to Eagle Syndrome   TONSILLECTOMY  10/14   right side due to Eagles syndrome   TRIGGER FINGER RELEASE     RIGHT   TYMPANOSTOMY TUBE PLACEMENT      Current Outpatient Medications  Medication  Sig Dispense Refill   Ascorbic Acid (VITAMIN C) 100 MG tablet Take 1,000 mg by mouth daily.     Biotin 1 MG CAPS Take 1 capsule by mouth daily.      calcium carbonate (OS-CAL - DOSED IN MG OF ELEMENTAL CALCIUM) 1250 (500 Ca) MG tablet Take 1 tablet by mouth daily with breakfast.     cholecalciferol (VITAMIN D) 1000 UNITS tablet Take 3,000 Units by mouth daily.      estradiol (ESTRACE) 0.1 MG/GM vaginal cream SMARTSIG:0.5 Gram(s) Vaginal Every Night     estradiol (VIVELLE-DOT) 0.025 MG/24HR Place 1 patch onto the skin 2 (two) times a week. 24 patch 4   hydrocortisone (ANUSOL-HC) 2.5 % rectal cream Place 1 Application rectally 2 (two) times daily. 30 g 0   L-Arginine 1000 MG TABS Take 1 tablet by mouth daily.     meloxicam (MOBIC) 15 MG tablet Take  1/2 to 1 tablet  Daily  with Food  for Pain & Inflammation 90 tablet 3   omeprazole (PRILOSEC) 10 MG capsule Take 1 capsule (10 mg total) by mouth daily. 90 capsule 3    Probiotic Product (PROBIOTIC PO) Take 1 tablet by mouth daily.     Pyridoxine HCl (B-6 PO) Take by mouth.     vitamin B-12 (CYANOCOBALAMIN) 100 MCG tablet Take 1,000 mcg by mouth daily. Every other day     No current facility-administered medications for this visit.    Family History  Problem Relation Age of Onset   Diabetes Mother    Hypertension Mother    Hyperlipidemia Mother    Osteoporosis Mother    Hypertension Brother    Hyperlipidemia Brother    Prostate cancer Father        Bone    Review of Systems  Constitutional: Negative.   Genitourinary: Negative.     Exam:   BP 121/74 (BP Location: Left Arm, Patient Position: Sitting, Cuff Size: Normal)   Pulse 62   Ht 4' 10.25" (1.48 m)   Wt 104 lb 3.2 oz (47.3 kg)   [name] 10/18/2001   BMI 21.59 kg/m   Height: 4' 10.25" (148 cm)  General appearance: alert, cooperative and appears stated age Breasts: normal appearance, no masses or tenderness Abdomen: soft, non-tender; bowel sounds normal; no masses,  no organomegaly Lymph nodes: Cervical, supraclavicular, and axillary nodes normal.  No abnormal inguinal nodes palpated Neurologic: Grossly normal  Pelvic: External genitalia:  no lesions              Urethra:  normal appearing urethra with no masses, tenderness or lesions              Bartholins and Skenes: normal                 Vagina: normal appearing vagina with atrophic changes and no discharge, no lesions              Cervix: absent              Pap taken: No. Bimanual Exam:  Uterus:  uterus absent              Adnexa: no mass, fullness, tenderness               Rectovaginal: Confirms               Anus:  normal sphincter tone, no lesions  Chaperone, Ina Homes, CMA, was present for exam.  Assessment/Plan: 1. Encntr for gyn exam (general) (routine) w/o  abn findings - Pap smear not indicated - Mammogram 09/2022 - Colonoscopy 2020 follow up 5 years - Bone mineral density 2022 - lab work done with PCP -  vaccines reviewed/updated  2. Other social stressor  3. Weight loss - discussed with pt.  She is going to really monitoring this to make sure this is stable  4. Osteopenia, unspecified location - is going to have this repeated this next year  5. Dysuria - Urine Culture  6. Hormone replacement therapy - estradiol (VIVELLE-DOT) 0.025 MG/24HR; Place 1 patch onto the skin 2 (two) times a week.  Dispense: 24 patch; Refill: 4

## 2023-01-09 ENCOUNTER — Ambulatory Visit (HOSPITAL_BASED_OUTPATIENT_CLINIC_OR_DEPARTMENT_OTHER): Payer: PPO | Admitting: Obstetrics & Gynecology

## 2023-01-09 ENCOUNTER — Encounter (HOSPITAL_BASED_OUTPATIENT_CLINIC_OR_DEPARTMENT_OTHER): Payer: Self-pay | Admitting: Obstetrics & Gynecology

## 2023-01-09 VITALS — BP 121/74 | HR 62 | Ht 58.25 in | Wt 104.2 lb

## 2023-01-09 DIAGNOSIS — Z79890 Hormone replacement therapy (postmenopausal): Secondary | ICD-10-CM

## 2023-01-09 DIAGNOSIS — Z659 Problem related to unspecified psychosocial circumstances: Secondary | ICD-10-CM | POA: Diagnosis not present

## 2023-01-09 DIAGNOSIS — Z01419 Encounter for gynecological examination (general) (routine) without abnormal findings: Secondary | ICD-10-CM | POA: Diagnosis not present

## 2023-01-09 DIAGNOSIS — R634 Abnormal weight loss: Secondary | ICD-10-CM | POA: Diagnosis not present

## 2023-01-09 DIAGNOSIS — R300 Dysuria: Secondary | ICD-10-CM | POA: Diagnosis not present

## 2023-01-09 DIAGNOSIS — M8580 Other specified disorders of bone density and structure, unspecified site: Secondary | ICD-10-CM | POA: Diagnosis not present

## 2023-01-09 MED ORDER — ESTRADIOL 0.025 MG/24HR TD PTTW: 1.0000 | MEDICATED_PATCH | TRANSDERMAL | 4 refills | Status: DC

## 2023-01-09 NOTE — Addendum Note (Signed)
Addended by: Ina Homes B on: 01/09/2023 11:58 AM   Modules accepted: Orders

## 2023-01-10 ENCOUNTER — Telehealth: Payer: Self-pay | Admitting: Obstetrics and Gynecology

## 2023-01-10 MED ORDER — FOSFOMYCIN TROMETHAMINE 3 G PO PACK: 3.0000 g | PACK | Freq: Once | ORAL | 0 refills | Status: AC

## 2023-01-10 NOTE — Telephone Encounter (Signed)
Call from patient after hours for dysuria and frequency.  Was seen yesterday for AEX and urine culture was sent. Results pending. Reports worsening of dysuria overnight and frequency. Denies fever, hematuria or new back pain.  Has a long h/o IC and recurrent UTI. Last episode treated with Fosfomycin 09/12/22. Multiple allergies reviewed with patient (mainly GI issues)  Sending refill on Fosfomycin. Patient instructed to increase H2O and to reach out to office for urine culture results on Monday. Patient to call if Sx do not resolve or worsen.

## 2023-01-12 ENCOUNTER — Telehealth: Payer: Self-pay | Admitting: Neurology

## 2023-01-12 NOTE — Telephone Encounter (Signed)
Pt rescheduled appt due to having cold, cough, fever.

## 2023-01-13 ENCOUNTER — Ambulatory Visit: Payer: PPO | Admitting: Neurology

## 2023-01-13 LAB — URINE CULTURE

## 2023-01-14 DIAGNOSIS — M79644 Pain in right finger(s): Secondary | ICD-10-CM | POA: Diagnosis not present

## 2023-01-20 ENCOUNTER — Ambulatory Visit (HOSPITAL_BASED_OUTPATIENT_CLINIC_OR_DEPARTMENT_OTHER): Payer: PPO

## 2023-01-20 ENCOUNTER — Encounter (HOSPITAL_BASED_OUTPATIENT_CLINIC_OR_DEPARTMENT_OTHER): Payer: Self-pay

## 2023-01-20 ENCOUNTER — Encounter (HOSPITAL_BASED_OUTPATIENT_CLINIC_OR_DEPARTMENT_OTHER): Payer: Self-pay | Admitting: Obstetrics & Gynecology

## 2023-01-20 ENCOUNTER — Other Ambulatory Visit (HOSPITAL_COMMUNITY)
Admission: RE | Admit: 2023-01-20 | Discharge: 2023-01-20 | Disposition: A | Discharge status: Home / Self Care | Payer: PPO | Source: Ambulatory Visit | Attending: Obstetrics & Gynecology | Admitting: Obstetrics & Gynecology

## 2023-01-20 VITALS — BP 122/62 | HR 64 | Ht 58.25 in | Wt 105.6 lb

## 2023-01-20 DIAGNOSIS — N898 Other specified noninflammatory disorders of vagina: Secondary | ICD-10-CM | POA: Insufficient documentation

## 2023-01-20 NOTE — Progress Notes (Signed)
Pt presented to office to repeat aptima swab. Pt stated she has a history of having bv. She did stated she had some burning and odor. Pt was educated on how to perform self swab. Specimen obtained and pt was notified once results come back we will give her a call.

## 2023-01-21 LAB — CERVICOVAGINAL ANCILLARY ONLY
Bacterial Vaginitis (gardnerella): NEGATIVE
Candida Glabrata: NEGATIVE
Candida Vaginitis: NEGATIVE
Comment: NEGATIVE
Comment: NEGATIVE
Comment: NEGATIVE

## 2023-01-23 DIAGNOSIS — G5601 Carpal tunnel syndrome, right upper limb: Secondary | ICD-10-CM | POA: Diagnosis not present

## 2023-01-27 DIAGNOSIS — N2889 Other specified disorders of kidney and ureter: Secondary | ICD-10-CM | POA: Diagnosis not present

## 2023-01-27 DIAGNOSIS — D1771 Benign lipomatous neoplasm of kidney: Secondary | ICD-10-CM | POA: Diagnosis not present

## 2023-01-27 DIAGNOSIS — N3020 Other chronic cystitis without hematuria: Secondary | ICD-10-CM | POA: Diagnosis not present

## 2023-01-27 DIAGNOSIS — N3090 Cystitis, unspecified without hematuria: Secondary | ICD-10-CM | POA: Diagnosis not present

## 2023-02-05 DIAGNOSIS — G5601 Carpal tunnel syndrome, right upper limb: Secondary | ICD-10-CM | POA: Diagnosis not present

## 2023-02-05 DIAGNOSIS — M65331 Trigger finger, right middle finger: Secondary | ICD-10-CM | POA: Diagnosis not present

## 2023-02-18 HISTORY — PX: CARPAL TUNNEL RELEASE: SHX101

## 2023-02-25 ENCOUNTER — Encounter: Payer: Self-pay | Admitting: Nurse Practitioner

## 2023-02-25 ENCOUNTER — Other Ambulatory Visit: Payer: Self-pay | Admitting: Nurse Practitioner

## 2023-02-25 DIAGNOSIS — K219 Gastro-esophageal reflux disease without esophagitis: Secondary | ICD-10-CM

## 2023-02-25 MED ORDER — OMEPRAZOLE 20 MG PO CPDR: 20.0000 mg | DELAYED_RELEASE_CAPSULE | Freq: Every day | ORAL | 2 refills | Status: DC

## 2023-02-26 ENCOUNTER — Encounter: Payer: Self-pay | Admitting: Internal Medicine

## 2023-02-26 DIAGNOSIS — N3020 Other chronic cystitis without hematuria: Secondary | ICD-10-CM | POA: Diagnosis not present

## 2023-03-13 ENCOUNTER — Ambulatory Visit: Payer: PPO | Admitting: Nurse Practitioner

## 2023-03-18 ENCOUNTER — Encounter: Payer: Self-pay | Admitting: Nurse Practitioner

## 2023-03-20 ENCOUNTER — Ambulatory Visit: Payer: PPO | Admitting: Nurse Practitioner

## 2023-03-20 ENCOUNTER — Ambulatory Visit (INDEPENDENT_AMBULATORY_CARE_PROVIDER_SITE_OTHER): Payer: PPO | Admitting: Nurse Practitioner

## 2023-03-20 VITALS — BP 120/70 | HR 60 | Temp 98.0°F | Resp 16 | Ht 58.5 in | Wt 107.2 lb

## 2023-03-20 DIAGNOSIS — L821 Other seborrheic keratosis: Secondary | ICD-10-CM | POA: Diagnosis not present

## 2023-03-20 DIAGNOSIS — G9331 Postviral fatigue syndrome: Secondary | ICD-10-CM

## 2023-03-20 NOTE — Progress Notes (Unsigned)
Assessment and Plan:  Helen Glover was seen today for an episodic visit.  Diagnoses and all order for this visit:  1. Postviral fatigue syndrome (Primary) Resolved  2. Seborrheic keratosis Continue to monitor for sudden new growth or increase in bleeding, irritation. Monitor for skin color changes Discussed cryotherapy or referral to Dermatology for further treatment as needed.     Continue to monitor for any increase in fever, chills, N/V, diarrhea, changes to bowel habits, blood in stool.  Notify office for further evaluation and treatment, questions or concerns if s/s fail to improve. The risks and benefits of my recommendations, as well as other treatment options were discussed with the patient today. Questions were answered.  Further disposition pending results of labs. Discussed med's effects and SE's.    Over 20 minutes of exam, counseling, chart review, and critical decision making was performed.   Future Appointments  Date Time Provider Department Center  06/11/2023 11:00 AM Raynelle Dick, NP [facility] None  06/15/2023  1:00 PM [facility] [facility] None  07/30/2023  1:30 PM [facility] DX DEXA 1 [facility]  08/03/2023 10:00 AM Sater, Pearletha Furl, MD [facility] None  08/28/2023 10:30 AM Raynelle Dick, NP [facility] None  12/04/2023 10:00 AM Raynelle Dick, NP [facility] None    ------------------------------------------------------------------------------------------------------------------   HPI BP 120/70   Pulse 60   Temp 98 F (36.7 C)   Resp 16   Ht 4' 10.5" (1.486 m)   Wt 107 lb 3.2 oz (48.6 kg)   [name] 10/18/2001   SpO2 95%   BMI 22.02 kg/m   73 y.o.female presents for evaluation of lingering fatigue after having Covid, which she reports has now resolved.    She shares today that she is concerned for an area to the mid back that she feels has been present over the last 3 months.  She is unable to clearly see the spot on her back making it  difficult to assess the color and size, however, she does feel as though the spot is more recent.    Past Medical History:  Diagnosis Date   Anxiety    Arthritis    in neck   Bursitis of right hip    Carpal tunnel syndrome, bilateral    rt wrist   Complete rotator cuff tear of left shoulder    Eagle's syndrome    GERD (gastroesophageal reflux disease)    Hearing loss of both ears 12/25/2014   Has hearing aids/tubes    Heart murmur    as child   HNP (herniated nucleus pulposus)    Ingrown toenail of right foot 2023   Interstitial cystitis    Kidney cysts    followed by Dr. Marlou Porch   Osteopenia    Plantar fasciitis    Spinal stenosis    Tingling    rt foot   Wears hearing aid      Allergies  Allergen Reactions   Amoxicillin-Pot Clavulanate Other (See Comments)   Levofloxacin Diarrhea   Other     Band-aid   Codeine Hives   Sulfa Antibiotics Nausea And Vomiting    Current Outpatient Medications on File Prior to Visit  Medication Sig   Ascorbic Acid (VITAMIN C) 100 MG tablet Take 1,000 mg by mouth daily.   Biotin 1 MG CAPS Take 1 capsule by mouth daily.    calcium carbonate (OS-CAL - DOSED IN MG OF ELEMENTAL CALCIUM) 1250 (500 Ca) MG tablet Take 1 tablet by mouth daily  with breakfast.   cholecalciferol (VITAMIN D) 1000 UNITS tablet Take 3,000 Units by mouth daily.    estradiol (ESTRACE) 0.1 MG/GM vaginal cream SMARTSIG:0.5 Gram(s) Vaginal Every Night   estradiol (VIVELLE-DOT) 0.025 MG/24HR Place 1 patch onto the skin 2 (two) times a week.   hydrocortisone (ANUSOL-HC) 2.5 % rectal cream Place 1 Application rectally 2 (two) times daily.   L-Arginine 1000 MG TABS Take 1 tablet by mouth daily.   meloxicam (MOBIC) 15 MG tablet Take  1/2 to 1 tablet  Daily  with Food  for Pain & Inflammation   omeprazole (PRILOSEC) 20 MG capsule Take 1 capsule (20 mg total) by mouth daily.   Probiotic Product (PROBIOTIC PO) Take 1 tablet by mouth daily.   Pyridoxine HCl (B-6 PO) Take by  mouth.   vitamin B-12 (CYANOCOBALAMIN) 100 MCG tablet Take 1,000 mcg by mouth daily. Every other day   No current facility-administered medications on file prior to visit.    ROS: all negative except what is noted in the HPI.   Physical Exam:  BP 120/70   Pulse 60   Temp 98 F (36.7 C)   Resp 16   Ht 4' 10.5" (1.486 m)   Wt 107 lb 3.2 oz (48.6 kg)   [name] 10/18/2001   SpO2 95%   BMI 22.02 kg/m   General Appearance: NAD.  Awake, conversant and cooperative. Eyes: PERRLA, EOMs intact.  Sclera white.  Conjunctiva without erythema. Sinuses: No frontal/maxillary tenderness.  No nasal discharge. Nares patent.  ENT/Mouth: Ext aud canals clear.  Bilateral TMs w/DOL and without erythema or bulging. Hearing intact.  Posterior pharynx without swelling or exudate.  Tonsils without swelling or erythema.  Neck: Supple.  No masses, nodules or thyromegaly. Respiratory: Effort is regular with non-labored breathing. Breath sounds are equal bilaterally without rales, rhonchi, wheezing or stridor.  Cardio: RRR with no MRGs. Brisk peripheral pulses without edema.  Abdomen: Active BS in all four quadrants.  Soft and non-tender without guarding, rebound tenderness, hernias or masses. Lymphatics: Non tender without lymphadenopathy.  Musculoskeletal: Full ROM, 5/5 strength, normal ambulation.  No clubbing or cyanosis. Skin: Mid back at the level just below the bra strap with approximately 2 cm raised round, warty like textured, skin colored area similar to SK. Neuro: CN II-XII grossly normal. Normal muscle tone without cerebellar symptoms and intact sensation.   Psych: AO X 3,  appropriate mood and affect, insight and judgment.     Adela Glimpse, NP 10:58 AM [facility]

## 2023-03-22 ENCOUNTER — Encounter: Payer: Self-pay | Admitting: Nurse Practitioner

## 2023-03-22 NOTE — Patient Instructions (Signed)
 Seborrheic Keratosis A seborrheic keratosis is a common, noncancerous (benign) skin growth. These growths are velvety, waxy, or rough spots that appear on the skin. They are often tan, brown, or black. The skin growths can be flat or raised and may be scaly. What are the causes? The cause of this condition is not known. What increases the risk? You are more likely to develop this condition if you: Have a family history of seborrheic keratosis. Are 72 years old or older. Are pregnant. Have had estrogen replacement therapy. What are the signs or symptoms? Symptoms of this condition include growths on the face, chest, shoulders, back, or other areas. These growths: Are usually painless, but may become irritated and itchy. Can be tan, yellow, brown, black, or other colors. Are slightly raised or have a flat surface. Are sometimes rough or wart-like in texture. Are often velvety or waxy on the surface. Are round or oval-shaped. Often occur in groups, but may occur as a single growth. How is this diagnosed? This condition is diagnosed with a medical history and physical exam. A sample of the growth may be tested (skin biopsy). You may also need to see a skin specialist (dermatologist). How is this treated? Treatment is not usually needed for this condition unless the growths are irritated or bleed often. You may also choose to have the growths removed if you do not like their appearance. Growth removal may include a procedure in which: Liquid nitrogen is applied to "freeze" off the growth (cryosurgery). This is the most common procedure. The growth is burned off with electricity (electrocautery). The growth is removed by scraping (curettage). Follow these instructions at home: Watch your growth or growths for any changes. Do not scratch or pick at the growth or growths. This can cause them to become irritated or infected. Contact a health care provider if: You suddenly have many new  growths. Your growth bleeds, itches, or hurts. Your growth suddenly becomes larger or changes color. Summary A seborrheic keratosis is a common, noncancerous skin growth. Treatment is not usually needed for this condition unless the growths are irritated or bleed often. Watch your growth or growths for any changes. Contact a health care provider if you suddenly have many new growths or your growth suddenly becomes larger or changes color. This information is not intended to replace advice given to you by your health care provider. Make sure you discuss any questions you have with your health care provider. Document Revised: 04/19/2021 Document Reviewed: 04/19/2021 Elsevier Patient Education  2024 [employer].

## 2023-03-23 ENCOUNTER — Encounter: Payer: Self-pay | Admitting: Nurse Practitioner

## 2023-03-23 DIAGNOSIS — H524 Presbyopia: Secondary | ICD-10-CM | POA: Diagnosis not present

## 2023-03-23 DIAGNOSIS — H2513 Age-related nuclear cataract, bilateral: Secondary | ICD-10-CM | POA: Diagnosis not present

## 2023-03-27 ENCOUNTER — Encounter (HOSPITAL_BASED_OUTPATIENT_CLINIC_OR_DEPARTMENT_OTHER): Payer: Self-pay | Admitting: Obstetrics & Gynecology

## 2023-03-30 ENCOUNTER — Telehealth: Payer: Self-pay

## 2023-03-30 NOTE — Telephone Encounter (Signed)
 Copied from CRM [id number]. Topic: Appointments - Transfer of Care >> Mar 30, 2023  2:46 PM Isabell A wrote: Pt is requesting to transfer FROM: [facility]  Pt is requesting to transfer TO: [facility]  Reason for requested transfer: Current PCP has passed away  It is the responsibility of the team the patient would like to transfer to (Dr. Glenetta Lane) to reach out to the patient if for any reason this transfer is not acceptable.  Please see E2C2 msg in regards to patient requesting TOC and advise.

## 2023-03-31 DIAGNOSIS — G5601 Carpal tunnel syndrome, right upper limb: Secondary | ICD-10-CM | POA: Diagnosis not present

## 2023-03-31 DIAGNOSIS — M65331 Trigger finger, right middle finger: Secondary | ICD-10-CM | POA: Diagnosis not present

## 2023-04-14 ENCOUNTER — Other Ambulatory Visit: Payer: Self-pay | Admitting: Obstetrics & Gynecology

## 2023-04-14 ENCOUNTER — Encounter: Payer: Self-pay | Admitting: Obstetrics & Gynecology

## 2023-04-14 DIAGNOSIS — Z0001 Encounter for general adult medical examination with abnormal findings: Secondary | ICD-10-CM

## 2023-04-14 DIAGNOSIS — Z136 Encounter for screening for cardiovascular disorders: Secondary | ICD-10-CM

## 2023-04-14 DIAGNOSIS — Z1329 Encounter for screening for other suspected endocrine disorder: Secondary | ICD-10-CM

## 2023-04-14 DIAGNOSIS — I10 Essential (primary) hypertension: Secondary | ICD-10-CM

## 2023-04-14 DIAGNOSIS — Z1389 Encounter for screening for other disorder: Secondary | ICD-10-CM

## 2023-04-14 DIAGNOSIS — Z79899 Other long term (current) drug therapy: Secondary | ICD-10-CM

## 2023-04-14 DIAGNOSIS — K219 Gastro-esophageal reflux disease without esophagitis: Secondary | ICD-10-CM

## 2023-04-14 DIAGNOSIS — M79644 Pain in right finger(s): Secondary | ICD-10-CM

## 2023-04-14 DIAGNOSIS — G608 Other hereditary and idiopathic neuropathies: Secondary | ICD-10-CM

## 2023-04-14 DIAGNOSIS — I700 Atherosclerosis of aorta: Secondary | ICD-10-CM

## 2023-04-14 DIAGNOSIS — Z789 Other specified health status: Secondary | ICD-10-CM

## 2023-04-14 DIAGNOSIS — E782 Mixed hyperlipidemia: Secondary | ICD-10-CM

## 2023-04-14 DIAGNOSIS — R7309 Other abnormal glucose: Secondary | ICD-10-CM

## 2023-04-14 DIAGNOSIS — M8580 Other specified disorders of bone density and structure, unspecified site: Secondary | ICD-10-CM

## 2023-04-14 DIAGNOSIS — M25641 Stiffness of right hand, not elsewhere classified: Secondary | ICD-10-CM | POA: Diagnosis not present

## 2023-04-14 DIAGNOSIS — E559 Vitamin D deficiency, unspecified: Secondary | ICD-10-CM

## 2023-04-16 DIAGNOSIS — L603 Nail dystrophy: Secondary | ICD-10-CM | POA: Diagnosis not present

## 2023-04-16 DIAGNOSIS — L820 Inflamed seborrheic keratosis: Secondary | ICD-10-CM | POA: Diagnosis not present

## 2023-04-28 DIAGNOSIS — N3020 Other chronic cystitis without hematuria: Secondary | ICD-10-CM | POA: Diagnosis not present

## 2023-05-02 DIAGNOSIS — M25552 Pain in left hip: Secondary | ICD-10-CM | POA: Diagnosis not present

## 2023-05-02 DIAGNOSIS — M545 Low back pain, unspecified: Secondary | ICD-10-CM | POA: Diagnosis not present

## 2023-05-05 ENCOUNTER — Other Ambulatory Visit: Payer: Self-pay

## 2023-05-05 ENCOUNTER — Encounter (HOSPITAL_BASED_OUTPATIENT_CLINIC_OR_DEPARTMENT_OTHER): Payer: Self-pay | Admitting: Emergency Medicine

## 2023-05-05 ENCOUNTER — Other Ambulatory Visit (HOSPITAL_BASED_OUTPATIENT_CLINIC_OR_DEPARTMENT_OTHER): Payer: Self-pay

## 2023-05-05 ENCOUNTER — Emergency Department (HOSPITAL_BASED_OUTPATIENT_CLINIC_OR_DEPARTMENT_OTHER)
Admission: EM | Admit: 2023-05-05 | Discharge: 2023-05-05 | Disposition: A | Discharge status: Home / Self Care | Attending: Emergency Medicine | Admitting: Emergency Medicine

## 2023-05-05 DIAGNOSIS — M5442 Lumbago with sciatica, left side: Secondary | ICD-10-CM | POA: Diagnosis not present

## 2023-05-05 DIAGNOSIS — M5432 Sciatica, left side: Secondary | ICD-10-CM | POA: Insufficient documentation

## 2023-05-05 DIAGNOSIS — M549 Dorsalgia, unspecified: Secondary | ICD-10-CM | POA: Diagnosis present

## 2023-05-05 MED ORDER — OXYCODONE HCL 5 MG PO TABS
5.0000 mg | ORAL_TABLET | Freq: Four times a day (QID) | ORAL | 0 refills | Status: DC | PRN
  Filled 2023-05-05: qty 14, 4d supply, fill #0

## 2023-05-05 NOTE — ED Triage Notes (Signed)
 C/o lower back pain that rads down into left leg since Saturday. [facility] and prescribed meds but no relief.

## 2023-05-05 NOTE — ED Provider Notes (Signed)
 [name] EMERGENCY DEPARTMENT AT [facility] Provider Note   CSN: [id number] Arrival date & time: 05/05/23  4098     History  Chief Complaint  Patient presents with   Back Pain    Helen Glover is a 73 y.o. female.  Patient with a complaint of left-sided back pain that radiates down into the leg.  Has had some burning pain to the top of the left leg.  No incontinence no problem with the right leg.  The patient has had several back surgeries in the past that mostly been on the right side.  Also followed by Dr. Yetta Barre from neurosurgery.  Patient went to [facility] for over the weekend and was started on prednisone Dosepak.  And also had x-rays of the back without any acute findings.  Symptoms started on Saturday.  Patient has been taking oxycodone at home that she had leftover for this.  But is running out of that.  Past medical history significant for interstitial cystitis Eagle syndrome spinal stenosis carpal tunnel syndrome herniated disc bursitis of right hip.  Patient's had a lumbar laminectomy trigger finger release and lumbar laminectomy decompression microdiscectomy in 2016 cervical fusion in 2020.  Patient is a former smoker quit 1988.  Patient without any involvement of the right leg no incontinence.  Patient feels like there are some weakness to the left leg.  Sometimes shooting pain to the top of the foot.  No persistent numbness.       Home Medications Prior to Admission medications   Medication Sig Start Date End Date Taking? Authorizing Provider  Ascorbic Acid (VITAMIN C) 100 MG tablet Take 1,000 mg by mouth daily.    [provider]  Biotin 1 MG CAPS Take 1 capsule by mouth daily.     [provider]  calcium carbonate (OS-CAL - DOSED IN MG OF ELEMENTAL CALCIUM) 1250 (500 Ca) MG tablet Take 1 tablet by mouth daily with breakfast.    [provider]  cholecalciferol (VITAMIN D) 1000 UNITS tablet Take 3,000 Units by mouth daily.      [provider]  estradiol (ESTRACE) 0.1 MG/GM vaginal cream SMARTSIG:0.5 Gram(s) Vaginal Every Night 10/27/22   [provider]  estradiol (VIVELLE-DOT) 0.025 MG/24HR Place 1 patch onto the skin 2 (two) times a week. 01/12/23   Jerene Bears, MD  hydrocortisone (ANUSOL-HC) 2.5 % rectal cream Place 1 Application rectally 2 (two) times daily. 12/09/21   Raynelle Dick, NP  L-Arginine 1000 MG TABS Take 1 tablet by mouth daily.    [provider]  meloxicam (MOBIC) 15 MG tablet Take  1/2 to 1 tablet  Daily  with Food  for Pain & Inflammation 08/19/22   Lucky Cowboy, MD  omeprazole (PRILOSEC) 20 MG capsule Take 1 capsule (20 mg total) by mouth daily. 02/25/23   Raynelle Dick, NP  Probiotic Product (PROBIOTIC PO) Take 1 tablet by mouth daily.    [provider]  Pyridoxine HCl (B-6 PO) Take by mouth.    [provider]  vitamin B-12 (CYANOCOBALAMIN) 100 MCG tablet Take 1,000 mcg by mouth daily. Every other day    [provider]      Allergies    Amoxicillin-pot clavulanate, Levofloxacin, Other, Codeine, and Sulfa antibiotics    Review of Systems   Review of Systems  Constitutional:  Negative for chills and fever.  HENT:  Negative for ear pain and sore throat.   Eyes:  Negative for pain and visual disturbance.  Respiratory:  Negative for cough and shortness of breath.   Cardiovascular:  Negative for chest pain and palpitations.  Gastrointestinal:  Negative for abdominal pain and vomiting.  Genitourinary:  Negative for dysuria and hematuria.  Musculoskeletal:  Positive for back pain. Negative for arthralgias.  Skin:  Negative for color change and rash.  Neurological:  Positive for weakness and numbness. Negative for seizures and syncope.  All other systems reviewed and are negative.   Physical Exam Updated Vital Signs BP 135/64 (BP Location: Right Arm)   Pulse 73   Temp 98.4 F (36.9 C) (Oral)   Resp 18   Ht 1.473 m (4\' 10" )    Wt 48.5 kg   [name] 10/18/2001   SpO2 99%   BMI 22.36 kg/m  Physical Exam Vitals and nursing note reviewed.  Constitutional:      General: She is not in acute distress.    Appearance: Normal appearance. She is well-developed.  HENT:     Head: Normocephalic and atraumatic.  Eyes:     Extraocular Movements: Extraocular movements intact.     Conjunctiva/sclera: Conjunctivae normal.     Pupils: Pupils are equal, round, and reactive to light.  Cardiovascular:     Rate and Rhythm: Normal rate and regular rhythm.     Heart sounds: No murmur heard. Pulmonary:     Effort: Pulmonary effort is normal. No respiratory distress.     Breath sounds: Normal breath sounds.  Abdominal:     Palpations: Abdomen is soft.     Tenderness: There is no abdominal tenderness.  Musculoskeletal:        General: No swelling.     Cervical back: Normal range of motion and neck supple.  Skin:    General: Skin is warm and dry.     Capillary Refill: Capillary refill takes less than 2 seconds.  Neurological:     General: No focal deficit present.     Mental Status: She is alert and oriented to person, place, and time.     Cranial Nerves: No cranial nerve deficit.     Sensory: No sensory deficit.     Motor: No weakness.  Psychiatric:        Mood and Affect: Mood normal.     ED Results / Procedures / Treatments   Labs (all labs ordered are listed, but only abnormal results are displayed) Labs Reviewed - No data to display  EKG None  Radiology No results found.  Procedures Procedures    Medications Ordered in ED Medications - No data to display  ED Course/ Medical Decision Making/ A&P                                 Medical Decision Making  Patient with less than 1 week history of left-sided sciatica.  Currently on steroids.  Followed by Dr. Yetta Barre from neurosurgery in the past.  Patient had x-rays over the weekend done by [facility] without any acute findings as per patient.  Patient  still with persistent pain despite trying leftover oxycodone at home.  5 mg oxycodone.  No incontinence no significant leg weakness and no involvement in the right leg.  Would recommend rest will renew her oxycodone and follow-up with neurosurgery.  No indication for MRI no concerns for cauda equina.  But if symptoms persist patient will need MRI.   Final Clinical Impression(s) / ED Diagnoses Final diagnoses:  Sciatica of left side  Rx / DC Orders ED Discharge Orders     None         Vanetta Mulders, MD 05/05/23 1209

## 2023-05-05 NOTE — Discharge Instructions (Addendum)
 Keep appointment and follow-up with Dr. Yetta Barre from neurosurgery.  Continue to take your prednisone.  Take the oxycodone as directed.  Can also take extra strength Tylenol 2 tablets every 8 hours as a supplement to this.  Return for development of any cauda equina that would be involvement in both legs or incontinence.

## 2023-05-05 NOTE — ED Notes (Signed)
 Reviewed AVS/discharge instruction with patient. Time allotted for and all questions answered. Patient is agreeable for d/c and escorted to ed exit by staff.

## 2023-05-12 DIAGNOSIS — M5416 Radiculopathy, lumbar region: Secondary | ICD-10-CM | POA: Diagnosis not present

## 2023-05-21 ENCOUNTER — Ambulatory Visit: Payer: PPO | Admitting: Family Medicine

## 2023-05-25 ENCOUNTER — Ambulatory Visit: Payer: Self-pay

## 2023-05-25 NOTE — Telephone Encounter (Signed)
 Chief Complaint: back pain Symptoms: moderate lower back pain radiates down left leg, intermittent left leg numbness and weakness Frequency: x 4 weeks Pertinent Negatives: Patient denies loss of bowel or bladder control, fever, abdominal pain, diarrhea Disposition: [x] ED /[] Urgent Care (no appt availability in office) / [] Appointment(In office/virtual)/ []  [place] [facility]/ [] Home Care/ [] Refused Recommended Disposition /[] [facility]/ []  Follow-up with PCP Additional Notes: Patient states she has a history of back surgeries and sciatic pain. She states she has been on 2 steroid packs in the past few weeks, states initially the first pack it felt improved for a few days. She states her usual back pain radiated down her right leg but this pain radiates down her left leg. Patient states she is able to ambulate without difficulty but states the pain is constant. Patient has an upcoming appt for an MRI but states she is worried because it will be another 2 weeks before [facility] can read the results. Advised patient go to ED and informed her it is not guaranteed they will order an MRI but if they do she will need to go to main hospital ED for MRI capabilities. Patient keeping her July 1st new patient appt with Dr Helen Glover at [facility].  Copied from CRM [id number]. Topic: Clinical - Red Word Triage >> May 25, 2023 10:09 AM Helen Glover wrote: Red Word that prompted transfer to Nurse Triage: The patient states she had a new patient appointment scheduled for Thursday that was rescheduled until July due to the Provider being ill. However, she has been having extreme pain from sciatica. She went to [facility] and she has a MRI scheduled for the 17th and a follow-up for the 29th. However, she is unsure how to cope with the pain until then. She is considering going to the Emergency Room but is unsure what to do. Reason for Disposition  Numbness in a leg or foot (i.e., loss of  sensation)  Answer Assessment - Initial Assessment Questions 1. ONSET: "When did the pain begin?"      4 weeks, she states it started as intermittent severe pain. She states it has been becoming more frequent.  2. LOCATION: "Where does it hurt?" (upper, mid or lower back)     Lower back: left buttocks.  3. SEVERITY: "How bad is the pain?"  (e.g., Scale 1-10; mild, moderate, or severe)   - MILD (1-3): Doesn't interfere with normal activities.    - MODERATE (4-7): Interferes with normal activities or awakens from sleep.    - SEVERE (8-10): Excruciating pain, unable to do any normal activities.      6-7/10, she states she has only taken the gabapentin for pain so far.  4. PATTERN: "Is the pain constant?" (e.g., yes, no; constant, intermittent)      Constant, 24/7. She states she is only sleeping an hour or two. She states she had to take an oxycodone last night due to the pain.  5. RADIATION: "Does the pain shoot into your legs or somewhere else?"     Down left leg and she states "on top of my shin and top of my foot". She states seldom the pain is in her groin.  6. CAUSE:  "What do you think is causing the back pain?"      Unsure, she states she has a long history of back problems.  7. BACK OVERUSE:  "Any recent lifting of heavy objects, strenuous work or exercise?"     She  states she had received a big heavy potted plant and she was dragging it, unsure if that affected her back.  8. MEDICINES: "What have you taken so far for the pain?" (e.g., nothing, acetaminophen, NSAIDS)     Oxycodone, gabapentin, meloxicam, muscle relaxer  9. NEUROLOGIC SYMPTOMS: "Do you have any weakness, numbness, or problems with bowel/bladder control?"     She states she has had some weakness and numbness (sporadic) down her left leg.  10. OTHER SYMPTOMS: "Do you have any other symptoms?" (e.g., fever, abdomen pain, burning with urination, blood in urine)       Increased bowel movements.  11. PREGNANCY: "Is  there any chance you are pregnant?" "When was your last menstrual period?"       N/A.  Protocols used: Back Pain-A-AH

## 2023-06-04 DIAGNOSIS — M5116 Intervertebral disc disorders with radiculopathy, lumbar region: Secondary | ICD-10-CM | POA: Diagnosis not present

## 2023-06-04 DIAGNOSIS — M48061 Spinal stenosis, lumbar region without neurogenic claudication: Secondary | ICD-10-CM | POA: Diagnosis not present

## 2023-06-04 DIAGNOSIS — M4807 Spinal stenosis, lumbosacral region: Secondary | ICD-10-CM | POA: Diagnosis not present

## 2023-06-04 DIAGNOSIS — M4725 Other spondylosis with radiculopathy, thoracolumbar region: Secondary | ICD-10-CM | POA: Diagnosis not present

## 2023-06-04 DIAGNOSIS — M5416 Radiculopathy, lumbar region: Secondary | ICD-10-CM | POA: Diagnosis not present

## 2023-06-11 ENCOUNTER — Ambulatory Visit: Payer: PPO | Admitting: Nurse Practitioner

## 2023-06-15 ENCOUNTER — Ambulatory Visit (INDEPENDENT_AMBULATORY_CARE_PROVIDER_SITE_OTHER): Payer: PPO | Admitting: Otolaryngology

## 2023-06-15 VITALS — BP 130/79 | HR 70 | Ht 58.5 in | Wt 107.0 lb

## 2023-06-15 DIAGNOSIS — H903 Sensorineural hearing loss, bilateral: Secondary | ICD-10-CM

## 2023-06-15 DIAGNOSIS — J310 Chronic rhinitis: Secondary | ICD-10-CM

## 2023-06-15 DIAGNOSIS — H7202 Central perforation of tympanic membrane, left ear: Secondary | ICD-10-CM

## 2023-06-15 DIAGNOSIS — R0981 Nasal congestion: Secondary | ICD-10-CM

## 2023-06-15 DIAGNOSIS — H7292 Unspecified perforation of tympanic membrane, left ear: Secondary | ICD-10-CM

## 2023-06-15 NOTE — Progress Notes (Unsigned)
 Patient ID: Helen Glover, female   DOB: November 05, 1950, 73 y.o.   MRN: [id number]  Follow-up: Left tympanic membrane perforation, hearing loss  HPI: The patient is a 73 year old female who returns today for her follow-up evaluation.  The patient has a history of eustachian tube dysfunction, and was previously treated with multiple myringotomy and tube placement.  The tubes have since extruded.  At her last visit 6 months ago, she was noted to have a dry left tympanic membrane perforation.  She also has a history of chronic rhinitis with nasal mucosal congestion.  She was treated with nasal saline irrigation as needed.  She also has a history of bilateral sensorineural hearing loss.  She currently wears bilateral hearing aids.  The patient returns today reporting no significant change in her hearing.  She has not noted any recent ear or sinus infections.  Exam: General: Communicates without difficulty, well nourished, no acute distress. Head: Normocephalic, no evidence injury, no tenderness, facial buttresses intact without stepoff. Face/sinus: No tenderness to palpation and percussion. Facial movement is normal and symmetric. Eyes: PERRL, EOMI. No scleral icterus, conjunctivae clear. Neuro: CN II exam reveals vision grossly intact. No nystagmus at any point of gaze. Ears: Auricles well formed without lesions.  The right tympanic membrane is intact and mobile.  The left tympanic membrane continues to have an inferior perforation.  No drainage is noted in either ear. Nose: External evaluation reveals normal support and skin without lesions. Dorsum is intact. Anterior rhinoscopy reveals congested mucosa over anterior aspect of inferior turbinates and intact septum. No purulence noted. Oral:  Oral cavity and oropharynx are intact, symmetric, without erythema or edema. Mucosa is moist without lesions. Neck: Full range of motion without pain. There is no significant lymphadenopathy. No masses palpable. Thyroid  bed  within normal limits to palpation. Parotid glands and submandibular glands equal bilaterally without mass. Trachea is midline. Neuro:  CN 2-12 grossly intact. Gait normal.   Assessment: 1.  The patient continues to have a dry left tympanic membrane perforation.  No infection is noted today. 2.  Subjectively stable bilateral sensorineural hearing loss. 3.  Chronic rhinitis with nasal mucosal congestion.  Plan: 1.  The physical exam findings are reviewed with the patient. 2.  Continue with nasal saline irrigation as needed. 3.  Continue the use of her hearing aids. 4.  Dry ear precautions on the left side. 5.  The patient will return for reevaluation in 6 months.

## 2023-06-16 ENCOUNTER — Encounter: Payer: Self-pay | Admitting: Family Medicine

## 2023-06-16 DIAGNOSIS — M5416 Radiculopathy, lumbar region: Secondary | ICD-10-CM | POA: Diagnosis not present

## 2023-06-29 DIAGNOSIS — M5416 Radiculopathy, lumbar region: Secondary | ICD-10-CM | POA: Diagnosis not present

## 2023-07-17 ENCOUNTER — Encounter (HOSPITAL_BASED_OUTPATIENT_CLINIC_OR_DEPARTMENT_OTHER): Payer: Self-pay | Admitting: Obstetrics & Gynecology

## 2023-07-20 ENCOUNTER — Encounter: Payer: Self-pay | Admitting: Family Medicine

## 2023-07-20 ENCOUNTER — Ambulatory Visit (INDEPENDENT_AMBULATORY_CARE_PROVIDER_SITE_OTHER): Admitting: Family Medicine

## 2023-07-20 ENCOUNTER — Ambulatory Visit: Payer: Self-pay | Admitting: Family Medicine

## 2023-07-20 VITALS — BP 112/66 | HR 64 | Temp 97.5°F | Resp 16 | Ht 58.5 in | Wt 107.5 lb

## 2023-07-20 DIAGNOSIS — E538 Deficiency of other specified B group vitamins: Secondary | ICD-10-CM | POA: Diagnosis not present

## 2023-07-20 DIAGNOSIS — R5383 Other fatigue: Secondary | ICD-10-CM | POA: Diagnosis not present

## 2023-07-20 DIAGNOSIS — J310 Chronic rhinitis: Secondary | ICD-10-CM

## 2023-07-20 DIAGNOSIS — Z87898 Personal history of other specified conditions: Secondary | ICD-10-CM | POA: Diagnosis not present

## 2023-07-20 DIAGNOSIS — K219 Gastro-esophageal reflux disease without esophagitis: Secondary | ICD-10-CM

## 2023-07-20 DIAGNOSIS — M5416 Radiculopathy, lumbar region: Secondary | ICD-10-CM | POA: Diagnosis not present

## 2023-07-20 DIAGNOSIS — Z79899 Other long term (current) drug therapy: Secondary | ICD-10-CM | POA: Diagnosis not present

## 2023-07-20 LAB — COMPREHENSIVE METABOLIC PANEL WITH GFR
ALT: 14 U/L (ref 0–35)
AST: 21 U/L (ref 0–37)
Albumin: 4.5 g/dL (ref 3.5–5.2)
Alkaline Phosphatase: 53 U/L (ref 39–117)
BUN: 13 mg/dL (ref 6–23)
CO2: 28 meq/L (ref 19–32)
Calcium: 9.2 mg/dL (ref 8.4–10.5)
Chloride: 101 meq/L (ref 96–112)
Creatinine, Ser: 0.64 mg/dL (ref 0.40–1.20)
GFR: 88.46 mL/min (ref >60.00)
Glucose, Bld: 78 mg/dL (ref 70–99)
Potassium: 4.2 meq/L (ref 3.5–5.1)
Sodium: 135 meq/L (ref 135–145)
Total Bilirubin: 0.4 mg/dL (ref 0.2–1.2)
Total Protein: 6.7 g/dL (ref 6.0–8.3)

## 2023-07-20 LAB — VITAMIN B12: Vitamin B-12: 1068 pg/mL — ABNORMAL HIGH (ref 211–911)

## 2023-07-20 LAB — CBC WITH DIFFERENTIAL/PLATELET
Basophils Absolute: 0 10*3/uL (ref 0.0–0.1)
Basophils Relative: 0.9 % (ref 0.0–3.0)
Eosinophils Absolute: 0.1 10*3/uL (ref 0.0–0.7)
Eosinophils Relative: 2.3 % (ref 0.0–5.0)
HCT: 38 % (ref 36.0–46.0)
Hemoglobin: 12.7 g/dL (ref 12.0–15.0)
Lymphocytes Relative: 35 % (ref 12.0–46.0)
Lymphs Abs: 1.3 10*3/uL (ref 0.7–4.0)
MCHC: 33.5 g/dL (ref 30.0–36.0)
MCV: 93.6 fl (ref 78.0–100.0)
Monocytes Absolute: 0.4 10*3/uL (ref 0.1–1.0)
Monocytes Relative: 10.8 % (ref 3.0–12.0)
Neutro Abs: 1.8 10*3/uL (ref 1.4–7.7)
Neutrophils Relative %: 51 % (ref 43.0–77.0)
Platelets: 222 10*3/uL (ref 150.0–400.0)
RBC: 4.06 Mil/uL (ref 3.87–5.11)
RDW: 13.1 % (ref 11.5–15.5)
WBC: 3.6 10*3/uL — ABNORMAL LOW (ref 4.0–10.5)

## 2023-07-20 LAB — HEMOGLOBIN A1C: Hgb A1c MFr Bld: 5.8 % (ref 4.6–6.5)

## 2023-07-20 LAB — TSH: TSH: 1.65 u[IU]/mL (ref 0.35–5.50)

## 2023-07-20 MED ORDER — IPRATROPIUM BROMIDE 0.06 % NA SOLN: 2.0000 | Freq: Four times a day (QID) | NASAL | 3 refills | Status: AC | PRN

## 2023-07-20 MED ORDER — OMEPRAZOLE 20 MG PO CPDR: 20.0000 mg | DELAYED_RELEASE_CAPSULE | Freq: Every day | ORAL | 3 refills | Status: AC

## 2023-07-20 MED ORDER — MELOXICAM 15 MG PO TABS: ORAL_TABLET | ORAL | 3 refills | Status: AC

## 2023-07-20 NOTE — Patient Instructions (Signed)
 Welcome to [facility]! It was a pleasure meeting you today.  As discussed, Please schedule a 6 month follow up visit today.  PLEASE NOTE:  If you had any LAB tests please let us know if you have not heard back within a few days. You may see your results on MyChart before we have a chance to review them but we will give you a call once they are reviewed by [place]. If we ordered any REFERRALS today, please let us know if you have not heard from their office within the next week.  Let us know through MyChart if you are needing REFILLS, or have your pharmacy send [place] the request. You can also use MyChart to communicate with me or any office staff.  Please try these tips to maintain a healthy lifestyle:  Eat most of your calories during the day when you are active. Eliminate processed foods including packaged sweets (pies, cakes, cookies), reduce intake of potatoes, white bread, white pasta, and white rice. Look for whole grain options, oat flour or almond flour.  Each meal should contain half fruits/vegetables, one quarter protein, and one quarter carbs (no bigger than a computer mouse).  Cut down on sweet beverages. This includes juice, soda, and sweet tea. Also watch fruit intake, though this is a healthier sweet option, it still contains natural sugar! Limit to 3 servings daily.  Drink at least 1 glass of water with each meal and aim for at least 8 glasses per day  Exercise at least 150 minutes every week.

## 2023-07-20 NOTE — Progress Notes (Signed)
 New Patient Office Visit  Subjective:  Patient ID: Helen Glover, female    DOB: 1950/06/18  Age: 73 y.o. MRN: [id number]  CC:  Chief Complaint  Patient presents with   Establish Care    Initial visit to establish care with new pcp Not fasting Discuss issue with back, neuropathy, tiredness, muscle weakness and bleeding hemorrhoids    HPI [name] presents for new pt.  Dr. Cassondra Cliff Discussed the use of AI scribe software for clinical note transcription with the patient, who gave verbal consent to proceed.  History of Present Illness Helen Glover "Pam" is a 73 year old female who presents for a follow-up visit.  She has a history of arthritis, anxiety, allergies, Eagle's syndrome, reflux, and interstitial cystitis. She has undergone multiple surgeries including spine surgery (C1, C2), lumbar surgeries, right knee arthroscopy, and carpal tunnel surgeries on both hands.  She experiences sciatica, which has improved on the left side following a back injection on Jun 29, 2023, but persists on the right side. She is scheduled for a nerve conduction study on August 03, 2023, for evaluation of neuropathy.  She takes meloxicam  daily for arthritis, initially prescribed by her urologist and later renewed by her primary care provider. She also takes omeprazole  daily for reflux, which is well-controlled with no breakthrough heartburn. She uses a bed that elevates the head to help manage her reflux symptoms.  She has interstitial cystitis, exacerbated by bowel movements. She experiences frequent bowel movements, sometimes three to four times a day, but denies any dark, tarry stools. She has hemorrhoids, which occasionally bleed, and she uses a cream for management.  She has trigeminal nerve issues, with pain that moves around her face. She previously used ipratropium nasal spray, which she discontinued due to side effects like sore throat or epistaxis. Her nasal symptoms persist, especially  when eating.  She has prediabetes and mentions seeing a note about kidney disease in her chart, though she has not been informed of this diagnosis. No high blood pressure, though it can be elevated during times of pain or high caffeine intake.  She experiences muscle weakness, particularly in her left leg, noticed since taking gabapentin. She is currently taking 100 mg of gabapentin at night, down from a higher dose, and is considering further reduction. She also has a history of lipedema, which she suspects may contribute to her leg pain.  She feels tired, which she attributes to her medication regimen, including past use of gabapentin and oxycodone . No depression, noting that her mood has improved since her divorce, and she plans to see her therapist in July.     Current Outpatient Medications:    Ascorbic Acid  (VITAMIN C ) 100 MG tablet, Take 1,000 mg by mouth daily., Disp: , Rfl:    Biotin 1 MG CAPS, Take 1 capsule by mouth daily. , Disp: , Rfl:    calcium carbonate (OS-CAL - DOSED IN MG OF ELEMENTAL CALCIUM) 1250 (500 Ca) MG tablet, Take 1 tablet by mouth daily with breakfast., Disp: , Rfl:    cholecalciferol (VITAMIN D ) 1000 UNITS tablet, Take 3,000 Units by mouth daily. , Disp: , Rfl:    estradiol  (ESTRACE ) 0.1 MG/GM vaginal cream, SMARTSIG:0.5 Gram(s) Vaginal Every Night, Disp: , Rfl:    estradiol  (VIVELLE -DOT) 0.025 MG/24HR, Place 1 patch onto the skin 2 (two) times a week., Disp: 24 patch, Rfl: 4   gabapentin (NEURONTIN) 100 MG tablet, Take 100 mg by mouth at bedtime., Disp: , Rfl:  GEMTESA 75 MG TABS, Take 1 tablet by mouth daily., Disp: , Rfl:    hydrocortisone  (ANUSOL -HC) 2.5 % rectal cream, Place 1 Application rectally 2 (two) times daily., Disp: 30 g, Rfl: 0   ipratropium (ATROVENT) 0.06 % nasal spray, Place 2 sprays into both nostrils 4 (four) times daily as needed for rhinitis., Disp: 15 mL, Rfl: 3   L-Arginine 1000 MG TABS, Take 1 tablet by mouth daily., Disp: , Rfl:     Probiotic Product (PROBIOTIC PO), Take 1 tablet by mouth daily., Disp: , Rfl:    Pyridoxine HCl (B-6 PO), Take by mouth., Disp: , Rfl:    vitamin B-12 (CYANOCOBALAMIN ) 100 MCG tablet, Take 1,000 mcg by mouth daily. Every other day, Disp: , Rfl:    meloxicam  (MOBIC ) 15 MG tablet, Take  1/2 to 1 tablet  Daily  with Food  for Pain & Inflammation, Disp: 90 tablet, Rfl: 3   omeprazole  (PRILOSEC) 20 MG capsule, Take 1 capsule (20 mg total) by mouth daily., Disp: 90 capsule, Rfl: 3  Past Medical History:  Diagnosis Date   Allergy    Anxiety    Arthritis    in neck   Bursitis of right hip    Carpal tunnel syndrome, bilateral    rt wrist   Cataract    Complete rotator cuff tear of left shoulder    Depression    Eagle's syndrome    GERD (gastroesophageal reflux disease)    Hearing loss of both ears 12/25/2014   Has hearing aids/tubes    Heart murmur    as child   HNP (herniated nucleus pulposus)    Ingrown toenail of right foot 2023   Interstitial cystitis    Kidney cysts    followed by Dr. Dulcy Gibney   Osteopenia    Plantar fasciitis    Spinal stenosis    Tingling    rt foot   Wears hearing aid     Past Surgical History:  Procedure Laterality Date   ABDOMINAL HYSTERECTOMY  2003   BSO-fibroids   ANAL SPHINCTEROTOMY     CARPAL TUNNEL RELEASE     LEFT   CARPAL TUNNEL RELEASE Right 2025   and trigger finger   CERVICAL FUSION  02/25/2018   c1-c2 with laminectomy   CESAREAN SECTION     CYSTOSCOPY     HEMORRHOID SURGERY N/A 1998   KNEE ARTHROSCOPY Right 06/22/2019   Dr. Brunilda Capra    LUMBAR LAMINECTOMY     LUMBAR LAMINECTOMY/DECOMPRESSION MICRODISCECTOMY N/A 08/09/2014   Procedure: MICRO LUMBAR DECOMPRESSION L3-4,  REDO DECOMPRESSION, MICRODISCECTOMY L4-5, FORAMINOTOMY L3-L4, L4-L5 BILATERAL;  Surgeon: Orvan Blanch, MD;  Location: [facility];  Service: Orthopedics;  Laterality: N/A;   MYRINGOTOMY     NASAL SINUS SURGERY     SPINE SURGERY     TONSILLECTOMY     LEFT only, due to Eagle  Syndrome   TONSILLECTOMY  11/2012   right side due to Eagles syndrome   TRIGGER FINGER RELEASE     RIGHT   TYMPANOSTOMY TUBE PLACEMENT      Family History  Problem Relation Age of Onset   Diabetes Mother    Hypertension Mother    Hyperlipidemia Mother    Osteoporosis Mother    Arthritis Mother    Hearing loss Mother    Kidney disease Mother    Hypertension Brother    Hyperlipidemia Brother    Prostate cancer Father        Bone   Cancer Father  Alcohol abuse Paternal Uncle    Alcohol abuse Paternal Uncle     Social History   Socioeconomic History   Marital status: Divorced    Spouse name: Not on file   Number of children: 2   Years of education: Not on file   Highest education level: Master's degree (e.g., MA, MS, MEng, MEd, MSW, MBA)  Occupational History   Not on file  Tobacco Use   Smoking status: Former    Current packs/day: 0.00    Average packs/day: 0.5 packs/day for 15.0 years (7.5 ttl pk-yrs)    Types: Cigarettes    Start date: 12/04/1971    Quit date: 12/04/1986    Years since quitting: 36.6   Smokeless tobacco: Never  Vaping Use   Vaping status: Never Used  Substance and Sexual Activity   Alcohol use: Yes    Alcohol/week: 3.0 standard drinks of alcohol    Types: 2 Glasses of wine, 1 Shots of liquor per week    Comment: once/wk-1-2   Drug use: No   Sexual activity: Not Currently    Partners: Male    Birth control/protection: Surgical    Comment: TAH/BSO  Other Topics Concern   Not on file  Social History Narrative   Right Handed   1 Cup of Hot Tea per Day   1 Coffee per Day      3 grands   Retired Runner, broadcasting/film/video   Social Drivers of Corporate investment banker Strain: Low Risk  (07/17/2023)   Overall Financial Resource Strain (CARDIA)    Difficulty of Paying Living Expenses: Not very hard  Food Insecurity: No Food Insecurity (07/17/2023)   Hunger Vital Sign    Worried About Running Out of Food in the Last Year: Never true    Ran Out of Food  in the Last Year: Never true  Transportation Needs: No Transportation Needs (07/17/2023)   PRAPARE - Administrator, Civil Service (Medical): No    Lack of Transportation (Non-Medical): No  Physical Activity: Insufficiently Active (07/17/2023)   Exercise Vital Sign    Days of Exercise per Week: 4 days    Minutes of Exercise per Session: 10 min  Stress: Stress Concern Present (07/17/2023)   [employer] of Occupational Health - Occupational Stress Questionnaire    Feeling of Stress : To some extent  Social Connections: Socially Isolated (07/17/2023)   Social Connection and Isolation Panel [NHANES]    Frequency of Communication with Friends and Family: More than three times a week    Frequency of Social Gatherings with Friends and Family: Twice a week    Attends Religious Services: Never    Database administrator or Organizations: No    Attends Engineer, structural: Not on file    Marital Status: Separated  Intimate Partner Violence: Not on file    ROS  ROS: Gen: no fever, chills  Skin: no rash, itching ENT: no ear pain, ear drainage, chronic nasal congestion/runny Eyes: no blurry vision, double vision Resp: no cough, wheeze,SOB CV: no CP, palpitations, LE edema,  GI: no heartburn, n/v/d/c, abd pain.  Loose-can be 2-3x/day  intermitt hemorrhoid bleed.   GU: no dysuria, urgency, frequency, hematuria MSK: intermitt face pain-trigeminal nerves Neuro: no dizziness, headache, weakness, vertigo Psych: no depression, anxiety, insomnia, SI   tired freq.    Objective:   Today's Vitals: BP 112/66   Pulse 64   Temp (!) 97.5 F (36.4 C) (Temporal)   Resp 16  Ht 4' 10.5" (1.486 m)   Wt 107 lb 8 oz (48.8 kg)   [name] 10/18/2001   SpO2 100%   BMI 22.09 kg/m   Physical Exam  Gen: WDWN NAD HEENT: NCAT, conjunctiva not injected, sclera nonicteric  NECK:  supple, no thyromegaly, no nodes, no carotid bruits CARDIAC: RRR, S1S2+, no murmur. DP 2+B LUNGS: CTAB. No  wheezes ABDOMEN:  BS+, soft, NTND, No HSM, no masses EXT:  no edema MSK: no gross abnormalities.  NEURO: A&O x3.  CN II-XII intact.  PSYCH: normal mood. Good eye contact   Assessment & Plan:  Chronic rhinitis -     Ipratropium Bromide; Place 2 sprays into both nostrils 4 (four) times daily as needed for rhinitis.  Dispense: 15 mL; Refill: 3  Gastroesophageal reflux disease without esophagitis -     CBC with Differential/Platelet -     Vitamin B12  Lumbar back pain with radiculopathy affecting right lower extremity -     Meloxicam ; Take  1/2 to 1 tablet  Daily  with Food  for Pain & Inflammation  Dispense: 90 tablet; Refill: 3  Gastroesophageal reflux disease, unspecified whether esophagitis present -     Omeprazole ; Take 1 capsule (20 mg total) by mouth daily.  Dispense: 90 capsule; Refill: 3  B12 deficiency  High risk medication use -     CBC with Differential/Platelet -     Comprehensive metabolic panel with GFR -     Vitamin B12  Other fatigue -     CBC with Differential/Platelet -     Comprehensive metabolic panel with GFR -     TSH  History of prediabetes -     Hemoglobin A1c  Assessment and Plan Assessment & Plan Interstitial cystitis   Chronic interstitial cystitis symptoms worsen with bowel movements, causing a likely nerve-related burning sensation. This condition is associated with irritable bowel syndrome and fibromyalgia.  Reflux disease   Chronic reflux disease is managed with daily omeprazole , effectively preventing breakthrough heartburn. She uses an adjustable bed to elevate her head, which alleviates symptoms. Send prescription for omeprazole .  Sciatica   Chronic sciatica shows left leg improvement after a back injection on May 12, but right leg sciatica persists. She is advised to delay fusion surgery as long as possible. Continue meloxicam  7.5-15mg  daily  Neuropathy   Chronic neuropathy presents with left leg muscle weakness. A nerve conduction study  is scheduled for June 16 to assess its relation to back issues. Gabapentin is reduced to 100 mg at night with potential discontinuation. Discussed side effects include muscle weakness and cognitive fogginess.  Lipedema   Suspected mild lipedema is present. She was previously advised to wear compression socks but reports discomfort and has reduced usage. Advise wearing compression socks a few times a week for a few hours to promote circulation.  Hemorrhoids   Chronic hemorrhoids cause intermittent bleeding. Plans to discuss management and colonoscopy prep with a gastroenterologist due to exacerbation of interstitial cystitis symptoms during prep. Schedule an appointment with a gastroenterologist.  Rhinorrhea-will retry ipratropium nasal spray     Follow-up: Return in about 6 months (around 01/19/2024) for annual physical.   Ellsworth Haas, MD

## 2023-07-20 NOTE — Progress Notes (Signed)
 Labs ok except 1.  A1C(3 month average of sugars) is elevated.  This is considered PreDiabetes.  Work on diet-decrease sugars and starches and aim for 30 minutes of exercise 5 days/week to prevent progression to diabetes  2.  Wbc slightly low-not sure what to make of it yet.  Repeat cbcd 1 month-lab only 3.  B12 great.  Can reduce dose or continue as it

## 2023-07-21 ENCOUNTER — Other Ambulatory Visit: Payer: Self-pay | Admitting: *Deleted

## 2023-07-21 DIAGNOSIS — D72819 Decreased white blood cell count, unspecified: Secondary | ICD-10-CM

## 2023-07-21 NOTE — Addendum Note (Signed)
 Addended by: Albertine Alpha on: 07/21/2023 01:16 PM   Modules accepted: Orders

## 2023-07-23 ENCOUNTER — Other Ambulatory Visit (HOSPITAL_BASED_OUTPATIENT_CLINIC_OR_DEPARTMENT_OTHER): Payer: Self-pay | Admitting: Obstetrics & Gynecology

## 2023-07-23 ENCOUNTER — Encounter (HOSPITAL_BASED_OUTPATIENT_CLINIC_OR_DEPARTMENT_OTHER): Payer: Self-pay | Admitting: Obstetrics & Gynecology

## 2023-07-23 DIAGNOSIS — M85851 Other specified disorders of bone density and structure, right thigh: Secondary | ICD-10-CM

## 2023-07-27 ENCOUNTER — Encounter (HOSPITAL_BASED_OUTPATIENT_CLINIC_OR_DEPARTMENT_OTHER): Payer: Self-pay | Admitting: Obstetrics & Gynecology

## 2023-07-27 ENCOUNTER — Encounter: Payer: Self-pay | Admitting: Family Medicine

## 2023-07-28 ENCOUNTER — Ambulatory Visit: Payer: Self-pay

## 2023-07-28 ENCOUNTER — Encounter: Payer: Self-pay | Admitting: Family Medicine

## 2023-07-28 ENCOUNTER — Other Ambulatory Visit (HOSPITAL_COMMUNITY)
Admission: RE | Admit: 2023-07-28 | Discharge: 2023-07-28 | Disposition: A | Discharge status: Home / Self Care | Source: Ambulatory Visit | Attending: Family Medicine | Admitting: Family Medicine

## 2023-07-28 ENCOUNTER — Ambulatory Visit: Payer: Self-pay | Admitting: Family Medicine

## 2023-07-28 ENCOUNTER — Ambulatory Visit (INDEPENDENT_AMBULATORY_CARE_PROVIDER_SITE_OTHER): Admitting: Family Medicine

## 2023-07-28 VITALS — BP 131/70 | HR 74 | Temp 97.5°F | Resp 16 | Ht 58.5 in | Wt 108.5 lb

## 2023-07-28 DIAGNOSIS — M79602 Pain in left arm: Secondary | ICD-10-CM | POA: Diagnosis not present

## 2023-07-28 DIAGNOSIS — D72819 Decreased white blood cell count, unspecified: Secondary | ICD-10-CM

## 2023-07-28 DIAGNOSIS — R300 Dysuria: Secondary | ICD-10-CM | POA: Insufficient documentation

## 2023-07-28 LAB — CBC WITH DIFFERENTIAL/PLATELET
Basophils Absolute: 0 10*3/uL (ref 0.0–0.1)
Basophils Relative: 0.5 % (ref 0.0–3.0)
Eosinophils Absolute: 0.1 10*3/uL (ref 0.0–0.7)
Eosinophils Relative: 1.1 % (ref 0.0–5.0)
HCT: 37.9 % (ref 36.0–46.0)
Hemoglobin: 12.9 g/dL (ref 12.0–15.0)
Lymphocytes Relative: 20.1 % (ref 12.0–46.0)
Lymphs Abs: 1.6 10*3/uL (ref 0.7–4.0)
MCHC: 34.1 g/dL (ref 30.0–36.0)
MCV: 93.5 fl (ref 78.0–100.0)
Monocytes Absolute: 0.5 10*3/uL (ref 0.1–1.0)
Monocytes Relative: 6.3 % (ref 3.0–12.0)
Neutro Abs: 5.8 10*3/uL (ref 1.4–7.7)
Neutrophils Relative %: 72 % (ref 43.0–77.0)
Platelets: 232 10*3/uL (ref 150.0–400.0)
RBC: 4.05 Mil/uL (ref 3.87–5.11)
RDW: 13.2 % (ref 11.5–15.5)
WBC: 8 10*3/uL (ref 4.0–10.5)

## 2023-07-28 LAB — POC URINALSYSI DIPSTICK (AUTOMATED)
Bilirubin, UA: NEGATIVE
Blood, UA: NEGATIVE
Glucose, UA: NEGATIVE
Ketones, UA: NEGATIVE
Nitrite, UA: NEGATIVE
Protein, UA: NEGATIVE
Spec Grav, UA: 1.02 (ref 1.010–1.025)
Urobilinogen, UA: 0.2 U/dL
pH, UA: 6 (ref 5.0–8.0)

## 2023-07-28 LAB — C-REACTIVE PROTEIN: CRP: <1 mg/dL (ref 0.5–20.0)

## 2023-07-28 LAB — SEDIMENTATION RATE: Sed Rate: 13 mm/h (ref 0–30)

## 2023-07-28 MED ORDER — METHYLPREDNISOLONE 4 MG PO TBPK: ORAL_TABLET | ORAL | 0 refills | Status: DC

## 2023-07-28 NOTE — Telephone Encounter (Signed)
 FYI Only or Action Required?: FYI only for provider  Patient was last seen in primary care on 07/20/2023 by Christel Cousins, MD. Called Nurse Triage reporting Arm Pain. Symptoms began several days ago. Interventions attempted: OTC medications: Tylenol  and Prescription medications: Meloxicam . Symptoms are: gradually worsening.  Triage Disposition: See PCP When Office is Open (Within 3 Days)  Patient/caregiver understands and will follow disposition?: Yes    Copied from CRM [id number]. Topic: Clinical - Red Word Triage >> Jul 28, 2023  8:18 AM Jenice Mitts wrote: Red Word that prompted transfer to Nurse Triage: severe pain   Patient stated she received a vaccine a few days ago and now her arm is in severe pain Reason for Disposition  [1] MODERATE pain (e.g., interferes with normal activities) AND [2] present > 3 days  Answer Assessment - Initial Assessment Questions 1. ONSET: "When did the pain start?"     Saturday started getting sore and worsened since then 2. LOCATION: "Where is the pain located?"     Left upper arm, radiating up into left lower neck.  3. PAIN: "How bad is the pain?" (Scale 1-10; or mild, moderate, severe)   - MILD (1-3): Doesn't interfere with normal activities.   - MODERATE (4-7): Interferes with normal activities (e.g., work or school) or awakens from sleep.   - SEVERE (8-10): Excruciating pain, unable to do any normal activities, unable to hold a cup of water.     6 (keeping her from sleeping) 4. WORK OR EXERCISE: "Has there been any recent work or exercise that involved this part of the body?"     Received a vaccine last Monday 5. CAUSE: "What do you think is causing the arm pain?"     Believes to be recent vaccine 6. OTHER SYMPTOMS: "Do you have any other symptoms?" (e.g., neck pain, swelling, rash, fever, numbness, weakness)     Mild pain at base of neck   Denies chest pain, redness at injection site, swelling, rash, new numbness/weakness Patient states she  feels if she raises arm up, it's not as bad.  Protocols used: Arm Pain-A-AH

## 2023-07-28 NOTE — Progress Notes (Signed)
 Subjective:     Patient ID: Helen Glover, female    DOB: 1950/06/05, 73 y.o.   MRN: [id number]  Chief Complaint  Patient presents with   Arm Pain    Left arm pain after receiving Tdap injection on 07/20/23 from the pharmacy, pain started on Saturday constant pain, no redness or swelling and localized to the upper arm/shoulder where she got  the injection    HPI Got Tdap on 6/2 at pharm Discussed the use of AI scribe software for clinical note transcription with the patient, who gave verbal consent to proceed.  History of Present Illness Helen Glover "Pam" is a 73 year old female who presents with left arm pain following a TDAP vaccination.  She received a TDAP vaccination on June 2nd at a pharmacy. Initially asymptomatic, she began experiencing soreness in her left arm five to six days later. The soreness progressively worsened over the weekend, becoming severe by Monday night, disrupting her sleep. She describes the pain as 'like a shoe' and notes that it worsens with gravity, though elevating the arm provides some relief. There is no redness, swelling, or weakness in the arm, but she experiences numbness and tingling. She is concerned about a possible connection to her previous C1-2 fusion surgery. No fever, chills, or rash.  She has a history of C1-2 fusion surgery and has experienced similar symptoms in the past. She takes meloxicam  daily and has added Tylenol  for pain management. She attempted icing the area but was unsure of its effectiveness.  She has a history of shingles as a child and has received both the initial and updated shingles vaccinations. She reports no current rash but remains vigilant for its development.  She also mentions urinary symptoms, including burning after urination and a sensation of 'sandpaper,' which she associates with her history of interstitial cystitis. She has a history of bacterial vaginosis and has contacted her gynecologist regarding these  symptoms. Requesting ua and cx    Health Maintenance Due  Topic Date Due   Colonoscopy  07/14/2023   Medicare Annual Wellness (AWV)  08/28/2023    Past Medical History:  Diagnosis Date   Allergy    Anxiety    Arthritis    in neck   Bursitis of right hip    Carpal tunnel syndrome, bilateral    rt wrist   Cataract    Complete rotator cuff tear of left shoulder    Depression    Eagle's syndrome    GERD (gastroesophageal reflux disease)    Hearing loss of both ears 12/25/2014   Has hearing aids/tubes    Heart murmur    as child   HNP (herniated nucleus pulposus)    Ingrown toenail of right foot 2023   Interstitial cystitis    Kidney cysts    followed by Dr. Dulcy Gibney   Osteopenia    Plantar fasciitis    Spinal stenosis    Tingling    rt foot   Wears hearing aid     Past Surgical History:  Procedure Laterality Date   ABDOMINAL HYSTERECTOMY  2003   BSO-fibroids   ANAL SPHINCTEROTOMY     CARPAL TUNNEL RELEASE     LEFT   CARPAL TUNNEL RELEASE Right 2025   and trigger finger   CERVICAL FUSION  02/25/2018   c1-c2 with laminectomy   CESAREAN SECTION     CYSTOSCOPY     HEMORRHOID SURGERY N/A 1998   KNEE ARTHROSCOPY Right 06/22/2019   Dr.  Norris    LUMBAR LAMINECTOMY     LUMBAR LAMINECTOMY/DECOMPRESSION MICRODISCECTOMY N/A 08/09/2014   Procedure: MICRO LUMBAR DECOMPRESSION L3-4,  REDO DECOMPRESSION, MICRODISCECTOMY L4-5, FORAMINOTOMY L3-L4, L4-L5 BILATERAL;  Surgeon: Orvan Blanch, MD;  Location: [facility];  Service: Orthopedics;  Laterality: N/A;   MYRINGOTOMY     NASAL SINUS SURGERY     SPINE SURGERY     TONSILLECTOMY     LEFT only, due to Eagle Syndrome   TONSILLECTOMY  11/2012   right side due to Eagles syndrome   TRIGGER FINGER RELEASE     RIGHT   TYMPANOSTOMY TUBE PLACEMENT       Current Outpatient Medications:    Ascorbic Acid  (VITAMIN C ) 100 MG tablet, Take 1,000 mg by mouth daily., Disp: , Rfl:    Biotin 1 MG CAPS, Take 1 capsule by mouth daily. ,  Disp: , Rfl:    calcium carbonate (OS-CAL - DOSED IN MG OF ELEMENTAL CALCIUM) 1250 (500 Ca) MG tablet, Take 1 tablet by mouth daily with breakfast., Disp: , Rfl:    cholecalciferol (VITAMIN D ) 1000 UNITS tablet, Take 3,000 Units by mouth daily. , Disp: , Rfl:    estradiol  (ESTRACE ) 0.1 MG/GM vaginal cream, SMARTSIG:0.5 Gram(s) Vaginal Every Night, Disp: , Rfl:    estradiol  (VIVELLE -DOT) 0.025 MG/24HR, Place 1 patch onto the skin 2 (two) times a week., Disp: 24 patch, Rfl: 4   gabapentin (NEURONTIN) 100 MG tablet, Take 100 mg by mouth at bedtime., Disp: , Rfl:    GEMTESA 75 MG TABS, Take 1 tablet by mouth daily., Disp: , Rfl:    hydrocortisone  (ANUSOL -HC) 2.5 % rectal cream, Place 1 Application rectally 2 (two) times daily., Disp: 30 g, Rfl: 0   ipratropium (ATROVENT ) 0.06 % nasal spray, Place 2 sprays into both nostrils 4 (four) times daily as needed for rhinitis., Disp: 15 mL, Rfl: 3   L-Arginine 1000 MG TABS, Take 1 tablet by mouth daily., Disp: , Rfl:    meloxicam  (MOBIC ) 15 MG tablet, Take  1/2 to 1 tablet  Daily  with Food  for Pain & Inflammation, Disp: 90 tablet, Rfl: 3   methylPREDNISolone  (MEDROL  DOSEPAK) 4 MG TBPK tablet, Please take per packaging instructions., Disp: 21 tablet, Rfl: 0   omeprazole  (PRILOSEC) 20 MG capsule, Take 1 capsule (20 mg total) by mouth daily., Disp: 90 capsule, Rfl: 3   Probiotic Product (PROBIOTIC PO), Take 1 tablet by mouth daily., Disp: , Rfl:    Pyridoxine HCl (B-6 PO), Take by mouth., Disp: , Rfl:    vitamin B-12 (CYANOCOBALAMIN ) 100 MCG tablet, Take 1,000 mcg by mouth daily. Every other day, Disp: , Rfl:   Allergies  Allergen Reactions   Amoxicillin -Pot Clavulanate Other (See Comments)   Levofloxacin Diarrhea   Other     Band-aid   Codeine Hives   Sulfa Antibiotics Nausea And Vomiting   ROS neg/noncontributory except as noted HPI/below      Objective:      BP 131/70   Pulse 74   Temp (!) 97.5 F (36.4 C) (Temporal)   Resp 16   Ht 4'  10.5" (1.486 m)   Wt 108 lb 8 oz (49.2 kg)   [name] 10/18/2001   SpO2 96%   BMI 22.29 kg/m  Wt Readings from Last 3 Encounters:  07/28/23 108 lb 8 oz (49.2 kg)  07/20/23 107 lb 8 oz (48.8 kg)  06/15/23 107 lb (48.5 kg)    Physical Exam   Gen: WDWN NAD HEENT: NCAT, conjunctiva  not injected, sclera nonicteric EXT:  no edema MSK: no gross abnormalities.  NEURO: A&O x3.  CN II-XII intact.  PSYCH: normal mood. Good eye contact Neck: old scar posteriorly.  + muscle tightness, fullness and tenderness on L side down into arm.  MS 5/5 BUE.  Good rom shoulder L No swelling injection sight of Tdap.    Skin: no rash.  Results for orders placed or performed in visit on 07/28/23  POCT Urinalysis Dipstick (Automated)   Collection Time: 07/28/23 12:35 PM  Result Value Ref Range   Color, UA YELLOW    Clarity, UA CLEAR    Glucose, UA Negative Negative   Bilirubin, UA NEG    Ketones, UA NEG    Spec Grav, UA 1.020 1.010 - 1.025   Blood, UA NEG    pH, UA 6.0 5.0 - 8.0   Protein, UA Negative Negative   Urobilinogen, UA 0.2 0.2 or 1.0 E.U./dL   Nitrite, UA NEG    Leukocytes, UA Moderate (2+) (A) Negative        Assessment & Plan:  Pain of left upper extremity  Dysuria -     POCT Urinalysis Dipstick (Automated) -     Urine Culture -     Cervicovaginal ancillary only  Leukopenia, unspecified type -     Sedimentation rate -     C-reactive protein -     Iron, TIBC and Ferritin Panel -     CBC with Differential/Platelet  Other orders -     methylPREDNISolone ; Please take per packaging instructions.  Dispense: 21 tablet; Refill: 0  Assessment and Plan Assessment & Plan Left arm pain   Five days after receiving a TDAP vaccination in the left arm, she developed progressively worsening left arm pain without redness, swelling, or weakness. Numbness and tingling may be related to a prior C1-2 fusion. The differential diagnosis includes cervical spine issues, shingles, or a vaccination  reaction. The absence of a rash makes shingles unlikely. The pain is shooting, worsens with gravity, and is likely neck-related rather than vaccination-related. Treatment options discussed include prednisone , muscle relaxers, and physical therapy. Prednisone  is not preferred by pt. methylprednisolone  is considered due to better tolerance. Steroids may elevate WBC count and scew sed rate-so checking labs today. Monitor for rash development. Continue meloxicam  and acetaminophen  for pain management. Apply heat and perform stretches. Consider physical therapy if symptoms persist. .  Perform blood work today to avoid skewed results from potential steroid use-leukopenia.  Urinary symptoms   She reports post-urination burning and a sandpaper-like sensation, possibly related to interstitial cystitis. Symptoms overlap with bacterial vaginosis, but no vaginal discharge is present. A self-swab for culture will be performed to rule out infection, and results will be awaited before initiating treatment.  General Health Maintenance   She received a TDAP vaccination on July 20, 2023, without immediate adverse reactions.    Return if symptoms worsen or fail to improve.  Ellsworth Haas, MD

## 2023-07-28 NOTE — Telephone Encounter (Signed)
 Noted

## 2023-07-28 NOTE — Progress Notes (Signed)
 The sed rate and cbc are fine.  Other tests pending, but machine broken so may be next week for other lab results.  Urine should be back on Thursday

## 2023-07-28 NOTE — Patient Instructions (Signed)
 Will determine treatment of urine/vag depending on culture.  Medrol  sent to pharmacy.  Follow up w/ortho or neurosurg if continues/worsens

## 2023-07-29 ENCOUNTER — Other Ambulatory Visit: Payer: Self-pay | Admitting: *Deleted

## 2023-07-29 LAB — IRON,TIBC AND FERRITIN PANEL
%SAT: 19 % (ref 16–45)
Ferritin: 60 ng/mL (ref 16–288)
Iron: 58 ug/dL (ref 45–160)
TIBC: 298 ug/dL (ref 250–450)

## 2023-07-29 LAB — CERVICOVAGINAL ANCILLARY ONLY
Bacterial Vaginitis (gardnerella): NEGATIVE
Candida Glabrata: NEGATIVE
Candida Vaginitis: NEGATIVE
Comment: NEGATIVE
Comment: NEGATIVE
Comment: NEGATIVE
Comment: NEGATIVE
Trichomonas: NEGATIVE

## 2023-07-29 LAB — URINE CULTURE
MICRO NUMBER:: 16561642
SPECIMEN QUALITY:: ADEQUATE

## 2023-07-29 MED ORDER — CEPHALEXIN 500 MG PO CAPS
500.0000 mg | ORAL_CAPSULE | Freq: Four times a day (QID) | ORAL | 0 refills | Status: AC
Start: 2023-07-29 — End: 2023-08-05

## 2023-07-29 NOTE — Progress Notes (Signed)
 No BV.  Iron ok

## 2023-07-30 ENCOUNTER — Other Ambulatory Visit: Payer: PPO

## 2023-07-31 MED ORDER — NITROFURANTOIN MONOHYD MACRO 100 MG PO CAPS: 100.0000 mg | ORAL_CAPSULE | Freq: Two times a day (BID) | ORAL | 0 refills | Status: AC

## 2023-08-03 ENCOUNTER — Ambulatory Visit: Payer: PPO | Admitting: Neurology

## 2023-08-03 ENCOUNTER — Telehealth: Payer: Self-pay | Admitting: Neurology

## 2023-08-03 ENCOUNTER — Encounter: Payer: Self-pay | Admitting: Neurology

## 2023-08-03 VITALS — BP 110/70 | HR 63 | Ht 58.5 in | Wt 106.6 lb

## 2023-08-03 DIAGNOSIS — Z981 Arthrodesis status: Secondary | ICD-10-CM

## 2023-08-03 DIAGNOSIS — R200 Anesthesia of skin: Secondary | ICD-10-CM | POA: Diagnosis not present

## 2023-08-03 DIAGNOSIS — M25512 Pain in left shoulder: Secondary | ICD-10-CM | POA: Diagnosis not present

## 2023-08-03 DIAGNOSIS — Z9889 Other specified postprocedural states: Secondary | ICD-10-CM

## 2023-08-03 NOTE — Telephone Encounter (Signed)
 no auth required sent to GI [phone]

## 2023-08-03 NOTE — Progress Notes (Signed)
 [facility]  PATIENT: Helen Glover DOB: [birth year]  REFERRING DOCTOR OR PCP: Vangie Genet, MD SOURCE: Patient, notes from primary care, imaging and lab reports, MRI images personally reviewed.  _________________________________   HISTORICAL  CHIEF COMPLAINT:  Chief Complaint  Patient presents with   Follow-up    RM 10, alone. Reports new L shoulder pain. PCP feels it may be coming from neck. Had Tdap vaccine a couple weeks ago. Saturday after, pain started.  No injuries prior to sx starting.     HISTORY OF PRESENT ILLNESS:  Helen Glover is a 73 y.o. woman with numbness and burning.  Update 08/03/2023 Since the last visit, she had labwork including SSA/SSB, RA, SPEP/IEF, B1 and B12.  These were normal or unremarkable.   She had stopped gabapentin but sciatic type pain increased so she went back on.   She also needed oxycodone  and a muscle relaxant.   She has reduced gabapentin dose back to 100 mg po qHS.      She has had left shoulder soreness.   She has known DJD and has needed upper cervical fusion.     The shoulder pain started about 5 days after a vaccination - pain is only in the deltoid muscle.      A steroid pack and meloxicam  has not helped.  She is also reporting chronic right sciatica and also had left sciatica, helped by an injection.   She will be having a NCV/EMG a [facility].      History of pain/numbness She reported increased pain the right foot since 2022, felt to be related to surgery at L4L5 (Dr. Leighton Punches) and L5S1 (Dr. Rochelle Chu).      The L4L5 decompression was first and she had had left > right leg pain leading to 2 operations at that level.   She had riht sciatica that was very painful in 2020 and had L5S1 decompression.  She felt weaker initially after the second operation but improved over time.     She had C1-C2 decompression in 2020 (Dr. Rochelle Chu) due to spinal stenosis      Since October 2023 she has had more right leg tingling.  Since  last month, she has also had more tingling in the left leg - not noted until she started to wear compression socks.    She notes mild right leg weakness.    She notes some numbness and tingling in her hands.   She has CTS and had surgery on the left in the past.   She wears a brace on the right.    She has itching sensation without redness on shoulders (had right before C1-C2 stenosis discovered)  She has  trigeminal neuralgia.   She was on 100 mg gabapentin at night (helped sleep) but she was groggy in the morning.  She was prescribed low dose Lyrica instead but has not taken it as she is sleeping better Imaging:  She has some urinary urgency but has been diagnosed with IC.  She has no incontinence.  She has 1 x nocturia.      She used to drink heavily a couple times a week but no longer does so.     DATA IMAGING MRI of the lumbar spine 06/12/2020 showed Mild spinal stenosis at L1-L2 and L2-L3 and mild to moderate spinal stenosis at L3-L4 and L4-L5.  There is severe bilateral foraminal narrowing at L4-L5.  There has been prior decompression at L4-L5 and L5-S1.  There is enhancing scar  posteriorly and in the right lateral recess at L4-L5 and posteriorly and at the lateral recesses at L5-S1  MRI of the cervical spine 10/13/2017 showed spinal stenosis at C2 due to degenerative pannus.  There is mild degenerative changes at C3-C4 through C6-C7 but no significant spinal stenosis or nerve root compression.  Labs: B12 ok, thiamine  is normal in 2023,   She had a NCV/EMG with Dr. Eben Golds in 2022 (legs) but results are not available.  REVIEW OF SYSTEMS: Constitutional: No fevers, chills, sweats, or change in appetite Eyes: No visual changes, double vision, eye pain Ear, nose and throat: No ear pain, nasal congestion, sore throat.  Note some hearing loss Cardiovascular: No chest pain, palpitations Respiratory:  No shortness of breath at rest or with exertion.   No wheezes GastrointestinaI: No nausea,  vomiting, diarrhea, abdominal pain, fecal incontinence Genitourinary:  No dysuria, urinary retention or frequency.  No nocturia. Musculoskeletal: Spine issues as above  integumentary: No rash, pruritus, skin lesions Neurological: as above Psychiatric: No depression at this time.  No anxiety Endocrine: No palpitations, diaphoresis, change in appetite, change in weigh or increased thirst Hematologic/Lymphatic:  No anemia, purpura, petechiae. Allergic/Immunologic: No itchy/runny eyes, nasal congestion, recent allergic reactions, rashes  ALLERGIES: Allergies  Allergen Reactions   Amoxicillin -Pot Clavulanate Other (See Comments)   Levofloxacin Diarrhea   Other     Band-aid   Codeine Hives   Sulfa Antibiotics Nausea And Vomiting    HOME MEDICATIONS:  Current Outpatient Medications:    Ascorbic Acid  (VITAMIN C ) 100 MG tablet, Take 1,000 mg by mouth daily., Disp: , Rfl:    Biotin 1 MG CAPS, Take 1 capsule by mouth daily. , Disp: , Rfl:    calcium carbonate (OS-CAL - DOSED IN MG OF ELEMENTAL CALCIUM) 1250 (500 Ca) MG tablet, Take 1 tablet by mouth daily with breakfast., Disp: , Rfl:    cephALEXin  (KEFLEX ) 500 MG capsule, Take 1 capsule (500 mg total) by mouth 4 (four) times daily for 7 days., Disp: 28 capsule, Rfl: 0   cholecalciferol (VITAMIN D ) 1000 UNITS tablet, Take 3,000 Units by mouth daily. , Disp: , Rfl:    estradiol  (ESTRACE ) 0.1 MG/GM vaginal cream, SMARTSIG:0.5 Gram(s) Vaginal Every Night, Disp: , Rfl:    estradiol  (VIVELLE -DOT) 0.025 MG/24HR, Place 1 patch onto the skin 2 (two) times a week., Disp: 24 patch, Rfl: 4   gabapentin (NEURONTIN) 100 MG tablet, Take 100 mg by mouth at bedtime., Disp: , Rfl:    GEMTESA 75 MG TABS, Take 1 tablet by mouth daily., Disp: , Rfl:    hydrocortisone  (ANUSOL -HC) 2.5 % rectal cream, Place 1 Application rectally 2 (two) times daily., Disp: 30 g, Rfl: 0   ipratropium (ATROVENT ) 0.06 % nasal spray, Place 2 sprays into both nostrils 4 (four) times  daily as needed for rhinitis., Disp: 15 mL, Rfl: 3   L-Arginine 1000 MG TABS, Take 1 tablet by mouth daily., Disp: , Rfl:    meloxicam  (MOBIC ) 15 MG tablet, Take  1/2 to 1 tablet  Daily  with Food  for Pain & Inflammation, Disp: 90 tablet, Rfl: 3   methylPREDNISolone  (MEDROL  DOSEPAK) 4 MG TBPK tablet, Please take per packaging instructions., Disp: 21 tablet, Rfl: 0   nitrofurantoin , macrocrystal-monohydrate, (MACROBID ) 100 MG capsule, Take 1 capsule (100 mg total) by mouth 2 (two) times daily for 5 days., Disp: 10 capsule, Rfl: 0   omeprazole  (PRILOSEC) 20 MG capsule, Take 1 capsule (20 mg total) by mouth daily., Disp: 90 capsule, Rfl:  3   Probiotic Product (PROBIOTIC PO), Take 1 tablet by mouth daily., Disp: , Rfl:    Pyridoxine HCl (B-6 PO), Take by mouth., Disp: , Rfl:    vitamin B-12 (CYANOCOBALAMIN ) 100 MCG tablet, Take 1,000 mcg by mouth daily. Every other day, Disp: , Rfl:   PAST MEDICAL HISTORY: Past Medical History:  Diagnosis Date   Allergy    Anxiety    Arthritis    in neck   Bursitis of right hip    Carpal tunnel syndrome, bilateral    rt wrist   Cataract    Complete rotator cuff tear of left shoulder    Depression    Eagle's syndrome    GERD (gastroesophageal reflux disease)    Hearing loss of both ears 12/25/2014   Has hearing aids/tubes    Heart murmur    as child   HNP (herniated nucleus pulposus)    Ingrown toenail of right foot 2023   Interstitial cystitis    Kidney cysts    followed by Dr. Dulcy Gibney   Osteopenia    Plantar fasciitis    Spinal stenosis    Tingling    rt foot   Wears hearing aid     PAST SURGICAL HISTORY: Past Surgical History:  Procedure Laterality Date   ABDOMINAL HYSTERECTOMY  2003   BSO-fibroids   ANAL SPHINCTEROTOMY     CARPAL TUNNEL RELEASE     LEFT   CARPAL TUNNEL RELEASE Right 2025   and trigger finger   CERVICAL FUSION  02/25/2018   c1-c2 with laminectomy   CESAREAN SECTION     CYSTOSCOPY     HEMORRHOID SURGERY N/A 1998    KNEE ARTHROSCOPY Right 06/22/2019   Dr. Brunilda Capra    LUMBAR LAMINECTOMY     LUMBAR LAMINECTOMY/DECOMPRESSION MICRODISCECTOMY N/A 08/09/2014   Procedure: MICRO LUMBAR DECOMPRESSION L3-4,  REDO DECOMPRESSION, MICRODISCECTOMY L4-5, FORAMINOTOMY L3-L4, L4-L5 BILATERAL;  Surgeon: Orvan Blanch, MD;  Location: [facility];  Service: Orthopedics;  Laterality: N/A;   MYRINGOTOMY     NASAL SINUS SURGERY     SPINE SURGERY     TONSILLECTOMY     LEFT only, due to Eagle Syndrome   TONSILLECTOMY  11/2012   right side due to [facility] syndrome   TRIGGER FINGER RELEASE     RIGHT   TYMPANOSTOMY TUBE PLACEMENT      FAMILY HISTORY: Family History  Problem Relation Age of Onset   Diabetes Mother    Hypertension Mother    Hyperlipidemia Mother    Osteoporosis Mother    Arthritis Mother    Hearing loss Mother    Kidney disease Mother    Hypertension Brother    Hyperlipidemia Brother    Prostate cancer Father        Bone   Cancer Father    Alcohol abuse Paternal Uncle    Alcohol abuse Paternal Uncle     SOCIAL HISTORY: Social History   Socioeconomic History   Marital status: Divorced    Spouse name: Not on file   Number of children: 2   Years of education: Not on file   Highest education level: Master's degree (e.g., MA, MS, MEng, MEd, MSW, MBA)  Occupational History   Not on file  Tobacco Use   Smoking status: Former    Current packs/day: 0.00    Average packs/day: 0.5 packs/day for 15.0 years (7.5 ttl pk-yrs)    Types: Cigarettes    Start date: 12/04/1971    Quit date: 12/04/1986  Years since quitting: 36.6   Smokeless tobacco: Never  Vaping Use   Vaping status: Never Used  Substance and Sexual Activity   Alcohol use: Yes    Alcohol/week: 3.0 standard drinks of alcohol    Types: 2 Glasses of wine, 1 Shots of liquor per week    Comment: once/wk-1-2   Drug use: No   Sexual activity: Not Currently    Partners: Male    Birth control/protection: Surgical    Comment: TAH/BSO  Other  Topics Concern   Not on file  Social History Narrative   Right Handed   1 Cup of Hot Tea per Day   1 Coffee per Day      3 grands   Retired Runner, broadcasting/film/video   Social Drivers of Corporate investment banker Strain: Low Risk  (07/17/2023)   Overall Financial Resource Strain (CARDIA)    Difficulty of Paying Living Expenses: Not very hard  Food Insecurity: No Food Insecurity (07/17/2023)   Hunger Vital Sign    Worried About Running Out of Food in the Last Year: Never true    Ran Out of Food in the Last Year: Never true  Transportation Needs: No Transportation Needs (07/17/2023)   PRAPARE - Administrator, Civil Service (Medical): No    Lack of Transportation (Non-Medical): No  Physical Activity: Insufficiently Active (07/17/2023)   Exercise Vital Sign    Days of Exercise per Week: 4 days    Minutes of Exercise per Session: 10 min  Stress: Stress Concern Present (07/17/2023)   [employer] of Occupational Health - Occupational Stress Questionnaire    Feeling of Stress : To some extent  Social Connections: Socially Isolated (07/17/2023)   Social Connection and Isolation Panel    Frequency of Communication with Friends and Family: More than three times a week    Frequency of Social Gatherings with Friends and Family: Twice a week    Attends Religious Services: Never    Database administrator or Organizations: No    Attends Engineer, structural: Not on file    Marital Status: Separated  Intimate Partner Violence: Not on file       PHYSICAL EXAM  Vitals:   08/03/23 0953  BP: 110/70  Pulse: 63  SpO2: 99%  Weight: 106 lb 9.6 oz (48.4 kg)  Height: 4' 10.5 (1.486 m)    Body mass index is 21.9 kg/m.   General: The patient is well-developed and well-nourished and in no acute distress  HEENT:  Head is [place]/AT.  Sclera are anicteric.    Neck: No carotid bruits are noted.  The neck is nontender.  Cardiovascular: The heart has a regular rate and rhythm with a  normal S1 and S2. There were no murmurs, gallops or rubs.    Skin: Extremities are without rash or  edema.  Musculoskeletal:  Back is nontender  Neurologic Exam  Mental status: The patient is alert and oriented x 3 at the time of the examination. The patient has apparent normal recent and remote memory, with an apparently normal attention span and concentration ability.   Speech is normal.  Cranial nerves: Extraocular movements are full.  Facial strength and sensation was normal.  No dysarthria.  Motor:  Muscle bulk is normal.   Tone is normal. Strength is  5 / 5 in all 4 extremities.   Sensory: Sensory testing is intact to pinprick, soft touch and vibration sensation in all 4 extremities.  She had very slight  reduced pp on right foot (L5 and S1) compared to left but vibration was equal.   She has altered sensation at the occiput (since C1-C2 surgery)  Coordination: Cerebellar testing reveals good finger-nose-finger and heel-to-shin bilaterally.  Gait and station: Station is normal.   Gait is normal. Tandem gait is normal for age. Loistine Rinne is negative.   Reflexes: Deep tendon reflexes are symmetric and normal bilaterally.   Plantar responses are flexor.    DIAGNOSTIC DATA (LABS, IMAGING, TESTING) - I reviewed patient records, labs, notes, testing and imaging myself where available.  Lab Results  Component Value Date   WBC 8.0 07/28/2023   HGB 12.9 07/28/2023   HCT 37.9 07/28/2023   MCV 93.5 07/28/2023   PLT 232.0 07/28/2023      Component Value Date/Time   NA 135 07/20/2023 0917   K 4.2 07/20/2023 0917   CL 101 07/20/2023 0917   CO2 28 07/20/2023 0917   GLUCOSE 78 07/20/2023 0917   BUN 13 07/20/2023 0917   CREATININE 0.64 07/20/2023 0917   CREATININE 0.62 12/04/2022 1110   CALCIUM 9.2 07/20/2023 0917   PROT 6.7 07/20/2023 0917   PROT 6.6 07/08/2022 1106   ALBUMIN 4.5 07/20/2023 0917   AST 21 07/20/2023 0917   ALT 14 07/20/2023 0917   ALKPHOS 53 07/20/2023 0917    BILITOT 0.4 07/20/2023 0917   GFRNONAA >60 02/07/2022 0552   GFRNONAA 95 07/10/2020 1147   GFRAA 110 07/10/2020 1147   Lab Results  Component Value Date   CHOL 218 (H) 12/04/2022   HDL 92 12/04/2022   LDLCALC 110 (H) 12/04/2022   TRIG 70 12/04/2022   CHOLHDL 2.4 12/04/2022   Lab Results  Component Value Date   HGBA1C 5.8 07/20/2023   Lab Results  Component Value Date   VITAMINB12 1,068 (H) 07/20/2023   Lab Results  Component Value Date   TSH 1.65 07/20/2023       ASSESSMENT AND PLAN  History of fusion of cervical spine - Plan: MR CERVICAL SPINE W WO CONTRAST  Acute pain of left shoulder - Plan: MR CERVICAL SPINE W WO CONTRAST  Status post lumbar surgery  Numbness   Due to new neck and shoulder pain and h/o cervical fusion, we will check MRI cervical  to determine if she needs to see surgery again (adjacent level disease?).  Pain this far out dro a vaccination is unlikely so probably not related. A steroid pack and meloxicam  has not helped - for now increase gabapentin up to 4 x 100 mg daily   Consider a different NSAID Stay active.  If pain still present and no severe change on MRI, consider PT Rtc prn  This visit is part of a comprehensive longitudinal care medical relationship regarding the patients primary diagnosis of degenerative spine disease and related concerns.     Leilyn Frayre A. Godwin Lat, MD, [facility] 08/03/2023, 10:34 AM Certified in Neurology, Clinical Neurophysiology, Sleep Medicine and Neuroimaging  [facility] [address], [address] [place], [place] [address] [phone]

## 2023-08-05 ENCOUNTER — Encounter (HOSPITAL_BASED_OUTPATIENT_CLINIC_OR_DEPARTMENT_OTHER): Payer: Self-pay | Admitting: Obstetrics & Gynecology

## 2023-08-05 ENCOUNTER — Encounter: Payer: Self-pay | Admitting: Neurology

## 2023-08-10 ENCOUNTER — Other Ambulatory Visit

## 2023-08-10 DIAGNOSIS — M5416 Radiculopathy, lumbar region: Secondary | ICD-10-CM | POA: Diagnosis not present

## 2023-08-12 ENCOUNTER — Encounter: Payer: Self-pay | Admitting: Neurology

## 2023-08-12 NOTE — Telephone Encounter (Signed)
 Answered this question on previous my chart message.

## 2023-08-13 ENCOUNTER — Ambulatory Visit: Payer: Self-pay | Admitting: Neurology

## 2023-08-13 ENCOUNTER — Encounter: Payer: Self-pay | Admitting: Family Medicine

## 2023-08-13 ENCOUNTER — Other Ambulatory Visit

## 2023-08-13 ENCOUNTER — Ambulatory Visit: Payer: Self-pay | Admitting: Family Medicine

## 2023-08-13 ENCOUNTER — Ambulatory Visit
Admission: RE | Admit: 2023-08-13 | Discharge: 2023-08-13 | Disposition: A | Discharge status: Home / Self Care | Source: Ambulatory Visit | Attending: Neurology | Admitting: Neurology

## 2023-08-13 ENCOUNTER — Other Ambulatory Visit: Payer: Self-pay | Admitting: *Deleted

## 2023-08-13 ENCOUNTER — Telehealth: Payer: Self-pay | Admitting: Neurology

## 2023-08-13 DIAGNOSIS — R300 Dysuria: Secondary | ICD-10-CM

## 2023-08-13 DIAGNOSIS — Z981 Arthrodesis status: Secondary | ICD-10-CM | POA: Diagnosis not present

## 2023-08-13 DIAGNOSIS — M47812 Spondylosis without myelopathy or radiculopathy, cervical region: Secondary | ICD-10-CM | POA: Diagnosis not present

## 2023-08-13 DIAGNOSIS — M25512 Pain in left shoulder: Secondary | ICD-10-CM

## 2023-08-13 DIAGNOSIS — M4802 Spinal stenosis, cervical region: Secondary | ICD-10-CM | POA: Diagnosis not present

## 2023-08-13 LAB — URINALYSIS, ROUTINE W REFLEX MICROSCOPIC
Bilirubin Urine: NEGATIVE
Hgb urine dipstick: NEGATIVE
Ketones, ur: NEGATIVE
Leukocytes,Ua: NEGATIVE
Nitrite: NEGATIVE
RBC / HPF: NONE SEEN (ref 0–?)
Specific Gravity, Urine: 1.01 (ref 1.000–1.030)
Total Protein, Urine: NEGATIVE
Urine Glucose: NEGATIVE
Urobilinogen, UA: 0.2 (ref 0.0–1.0)
WBC, UA: NONE SEEN (ref 0–?)
pH: 6 (ref 5.0–8.0)

## 2023-08-13 MED ORDER — GADOPICLENOL 0.5 MMOL/ML IV SOLN
5.0000 mL | Freq: Once | INTRAVENOUS | Status: AC | PRN
  Administered 2023-08-13: 5 mL via INTRAVENOUS

## 2023-08-13 NOTE — Telephone Encounter (Signed)
 MRI of the cervical spine performed today shows severe spinal stenosis with spinal cord compression and increased T2 hyperintense signal at C4-C5.  There is an epidural peripherally enhancing mass worrisome for an epidural abscess.  She notes that symptoms have progressed compared to when I saw her in the office on 08/03/2023.  Initially she had had pain that was mostly in the shoulder but now she has more severe pain and headache that started a couple days ago.  She does not note a change in her gait or change in her bladder.  Her cervical fusion was performed by Dr. Alm Molt at [facility] neurosurgery (2022).  She does have an appointment to see him but not until July 8.    Because of the MRI findings and her clinical worsening I will try to reach the on-call physician.

## 2023-08-13 NOTE — Progress Notes (Signed)
 Urine is negative.  Culture will be back over the weekend.  No meds needed yet.

## 2023-08-14 ENCOUNTER — Encounter: Payer: Self-pay | Admitting: Neurology

## 2023-08-14 LAB — URINE CULTURE
MICRO NUMBER:: 16629283
SPECIMEN QUALITY:: ADEQUATE

## 2023-08-16 NOTE — Progress Notes (Signed)
 Urine culture is negative-needs to see her urologist

## 2023-08-17 ENCOUNTER — Other Ambulatory Visit

## 2023-08-18 ENCOUNTER — Ambulatory Visit: Admitting: Family Medicine

## 2023-08-18 ENCOUNTER — Other Ambulatory Visit: Payer: Self-pay | Admitting: Neurological Surgery

## 2023-08-18 DIAGNOSIS — M5412 Radiculopathy, cervical region: Secondary | ICD-10-CM | POA: Diagnosis not present

## 2023-08-18 DIAGNOSIS — M542 Cervicalgia: Secondary | ICD-10-CM | POA: Diagnosis not present

## 2023-08-18 DIAGNOSIS — M4802 Spinal stenosis, cervical region: Secondary | ICD-10-CM | POA: Diagnosis not present

## 2023-08-19 DIAGNOSIS — M542 Cervicalgia: Secondary | ICD-10-CM | POA: Diagnosis not present

## 2023-08-24 NOTE — Pre-Procedure Instructions (Signed)
 Surgical Instructions   Your procedure is scheduled on Friday, July 11th. Report to [facility] Main Entrance A at 1:00 P.M., then check in with the Admitting office. Any questions or running late day of surgery: call [phone]  Questions prior to your surgery date: call [phone], Monday-Friday, 8am-4pm. If you experience any cold or flu symptoms such as cough, fever, chills, shortness of breath, etc. between now and your scheduled surgery, please notify us  at the above number.     Remember:  Do not eat after midnight the night before your surgery  You may drink clear liquids until 11:55 AM the morning of your surgery.   Clear liquids allowed are: Water, Non-Citrus Juices (without pulp), Carbonated Beverages, Clear Tea (no milk, honey, etc.), Black Coffee Only (NO MILK, CREAM OR POWDERED CREAMER of any kind), and Gatorade.    Take these medicines the morning of surgery with A SIP OF WATER  GEMTESA  ipratropium (ATROVENT )  omeprazole  (PRILOSEC)     One week prior to surgery, STOP taking any Aspirin (unless otherwise instructed by your surgeon) Aleve, Naproxen, Ibuprofen , Motrin , Advil , Goody's, BC's, all herbal medications, fish oil, and non-prescription vitamins. This includes meloxicam  (MOBIC ).                     Do NOT Smoke (Tobacco/Vaping) for 24 hours prior to your procedure.  If you use a CPAP at night, you may bring your mask/headgear for your overnight stay.   You will be asked to remove any contacts, glasses, piercing's, hearing aid's, dentures/partials prior to surgery. Please bring cases for these items if needed.    Patients discharged the day of surgery will not be allowed to drive home, and someone needs to stay with them for 24 hours.  SURGICAL WAITING ROOM VISITATION Patients may have no more than 2 support people in the waiting area - these visitors may rotate.   Pre-op nurse will coordinate an appropriate time for 1 ADULT support person, who may not  rotate, to accompany patient in pre-op.  Children under the age of 76 must have an adult with them who is not the patient and must remain in the main waiting area with an adult.  If the patient needs to stay at the hospital during part of their recovery, the visitor guidelines for inpatient rooms apply.  Please refer to the [facility] website for the visitor guidelines for any additional information.   If you received a COVID test during your pre-op visit  it is requested that you wear a mask when out in public, stay away from anyone that may not be feeling well and notify your surgeon if you develop symptoms. If you have been in contact with anyone that has tested positive in the last 10 days please notify you surgeon.      Pre-operative 5 CHG Bathing Instructions   You can play a key role in reducing the risk of infection after surgery. Your skin needs to be as free of germs as possible. You can reduce the number of germs on your skin by washing with CHG (chlorhexidine  gluconate) soap before surgery. CHG is an antiseptic soap that kills germs and continues to kill germs even after washing.   DO NOT use if you have an allergy to chlorhexidine /CHG or antibacterial soaps. If your skin becomes reddened or irritated, stop using the CHG and notify one of our RNs at [phone].   Please shower with the CHG soap starting 4 days before  surgery using the following schedule:     Please keep in mind the following:  DO NOT shave, including legs and underarms, starting the day of your first shower.   You may shave your face at any point before/day of surgery.  Place clean sheets on your bed the day you start using CHG soap. Use a clean washcloth (not used since being washed) for each shower. DO NOT sleep with pets once you start using the CHG.   CHG Shower Instructions:  Wash your face and private area with normal soap. If you choose to wash your hair, wash first with your normal shampoo.   After you use shampoo/soap, rinse your hair and body thoroughly to remove shampoo/soap residue.  Turn the water OFF and apply about 3 tablespoons (45 ml) of CHG soap to a CLEAN washcloth.  Apply CHG soap ONLY FROM YOUR NECK DOWN TO YOUR TOES (washing for 3-5 minutes)  DO NOT use CHG soap on face, private areas, open wounds, or sores.  Pay special attention to the area where your surgery is being performed.  If you are having back surgery, having someone wash your back for you may be helpful. Wait 2 minutes after CHG soap is applied, then you may rinse off the CHG soap.  Pat dry with a clean towel  Put on clean clothes/pajamas   If you choose to wear lotion, please use ONLY the CHG-compatible lotions that are listed below.  Additional instructions for the day of surgery: DO NOT APPLY any lotions, deodorants, cologne, or perfumes.   Do not bring valuables to the hospital. [facility] is not responsible for any belongings/valuables. Do not wear nail polish, gel polish, artificial nails, or any other type of covering on natural nails (fingers and toes) Do not wear jewelry or makeup Put on clean/comfortable clothes.  Please brush your teeth.  Ask your nurse before applying any prescription medications to the skin.     CHG Compatible Lotions   Aveeno Moisturizing lotion  Cetaphil Moisturizing Cream  Cetaphil Moisturizing Lotion  Clairol Herbal Essence Moisturizing Lotion, Dry Skin  Clairol Herbal Essence Moisturizing Lotion, Extra Dry Skin  Clairol Herbal Essence Moisturizing Lotion, Normal Skin  Curel Age Defying Therapeutic Moisturizing Lotion with Alpha Hydroxy  Curel Extreme Care Body Lotion  Curel Soothing Hands Moisturizing Hand Lotion  Curel Therapeutic Moisturizing Cream, Fragrance-Free  Curel Therapeutic Moisturizing Lotion, Fragrance-Free  Curel Therapeutic Moisturizing Lotion, Original Formula  Eucerin Daily Replenishing Lotion  Eucerin Dry Skin Therapy Plus Alpha Hydroxy  Crme  Eucerin Dry Skin Therapy Plus Alpha Hydroxy Lotion  Eucerin Original Crme  Eucerin Original Lotion  Eucerin Plus Crme Eucerin Plus Lotion  Eucerin TriLipid Replenishing Lotion  Keri Anti-Bacterial Hand Lotion  Keri Deep Conditioning Original Lotion Dry Skin Formula Softly Scented  Keri Deep Conditioning Original Lotion, Fragrance Free Sensitive Skin Formula  Keri Lotion Fast Absorbing Fragrance Free Sensitive Skin Formula  Keri Lotion Fast Absorbing Softly Scented Dry Skin Formula  Keri Original Lotion  Keri Skin Renewal Lotion Keri Silky Smooth Lotion  Keri Silky Smooth Sensitive Skin Lotion  Nivea Body Creamy Conditioning Oil  Nivea Body Extra Enriched Lotion  Nivea Body Original Lotion  Nivea Body Sheer Moisturizing Lotion Nivea Crme  Nivea Skin Firming Lotion  NutraDerm 30 Skin Lotion  NutraDerm Skin Lotion  NutraDerm Therapeutic Skin Cream  NutraDerm Therapeutic Skin Lotion  ProShield Protective Hand Cream  Provon moisturizing lotion  Please read over the following fact sheets that you were given.

## 2023-08-25 ENCOUNTER — Other Ambulatory Visit: Payer: Self-pay

## 2023-08-25 ENCOUNTER — Encounter: Payer: Self-pay | Admitting: Neurology

## 2023-08-25 ENCOUNTER — Inpatient Hospital Stay (HOSPITAL_COMMUNITY)
Admission: RE | Admit: 2023-08-25 | Discharge: 2023-08-25 | Disposition: A | Discharge status: Home / Self Care | Source: Ambulatory Visit | Attending: Neurological Surgery

## 2023-08-25 ENCOUNTER — Encounter (HOSPITAL_COMMUNITY): Payer: Self-pay

## 2023-08-25 ENCOUNTER — Other Ambulatory Visit: Payer: Self-pay | Admitting: Neurology

## 2023-08-25 VITALS — BP 115/65 | HR 75 | Temp 97.9°F | Resp 16 | Ht 58.5 in | Wt 106.7 lb

## 2023-08-25 DIAGNOSIS — Z01818 Encounter for other preprocedural examination: Secondary | ICD-10-CM | POA: Insufficient documentation

## 2023-08-25 DIAGNOSIS — I10 Essential (primary) hypertension: Secondary | ICD-10-CM | POA: Insufficient documentation

## 2023-08-25 HISTORY — DX: Fusion of spine, cervical region: M43.22

## 2023-08-25 LAB — CBC
HCT: 39.5 % (ref 36.0–46.0)
Hemoglobin: 13.3 g/dL (ref 12.0–15.0)
MCH: 32.2 pg (ref 26.0–34.0)
MCHC: 33.7 g/dL (ref 30.0–36.0)
MCV: 95.6 fL (ref 80.0–100.0)
Platelets: 288 K/uL (ref 150–400)
RBC: 4.13 MIL/uL (ref 3.87–5.11)
RDW: 12.3 % (ref 11.5–15.5)
WBC: 7.8 K/uL (ref 4.0–10.5)
nRBC: 0 % (ref 0.0–0.2)

## 2023-08-25 LAB — BASIC METABOLIC PANEL WITH GFR
Anion gap: 10 (ref 5–15)
BUN: 11 mg/dL (ref 8–23)
CO2: 27 mmol/L (ref 22–32)
Calcium: 9.6 mg/dL (ref 8.9–10.3)
Chloride: 101 mmol/L (ref 98–111)
Creatinine, Ser: 0.63 mg/dL (ref 0.44–1.00)
GFR, Estimated: >60 mL/min (ref >60)
Glucose, Bld: 96 mg/dL (ref 70–99)
Potassium: 4.4 mmol/L (ref 3.5–5.1)
Sodium: 138 mmol/L (ref 135–145)

## 2023-08-25 LAB — SURGICAL PCR SCREEN
MRSA, PCR: NEGATIVE
Staphylococcus aureus: NEGATIVE

## 2023-08-25 LAB — TYPE AND SCREEN
ABO/RH(D): O POS
Antibody Screen: NEGATIVE

## 2023-08-25 MED ORDER — OXCARBAZEPINE 150 MG PO TABS: 150.0000 mg | ORAL_TABLET | Freq: Two times a day (BID) | ORAL | 5 refills | Status: DC

## 2023-08-25 NOTE — Progress Notes (Addendum)
 PCP - Dr. Jenkins Earnie Carrel Cardiologist - denies  PPM/ICD - denies   Chest x-ray - 02/26/21 EKG - 12/04/22 Stress Test - denies ECHO - denies Cardiac Cath - denies  Sleep Study - denies   DM- denies  Last dose of GLP1 agonist-  n/a   ASA/Blood Thinner Instructions: n/a   ERAS Protcol - clears until 1155   COVID TEST- n/a   Anesthesia review: yes, pt states she was told she was a difficult intubation (over 10 years ago). She has had multiple surgeries since with no issues. Discussed with [place], [place].   Patient denies shortness of breath, fever, cough and chest pain at PAT appointment   All instructions explained to the patient, with a verbal understanding of the material. Patient agrees to go over the instructions while at home for a better understanding.  The opportunity to ask questions was provided.

## 2023-08-26 ENCOUNTER — Encounter (HOSPITAL_COMMUNITY): Payer: Self-pay

## 2023-08-28 ENCOUNTER — Encounter (HOSPITAL_COMMUNITY)
Admission: RE | Disposition: A | Discharge status: Home / Self Care | Payer: Self-pay | Source: Home / Self Care | Attending: Neurological Surgery

## 2023-08-28 ENCOUNTER — Inpatient Hospital Stay (HOSPITAL_COMMUNITY)
Admission: RE | Admit: 2023-08-28 | Discharge: 2023-08-29 | DRG: 473 | Disposition: A | Discharge status: Home / Self Care | Attending: Neurological Surgery | Admitting: Neurological Surgery

## 2023-08-28 ENCOUNTER — Inpatient Hospital Stay (HOSPITAL_COMMUNITY)

## 2023-08-28 ENCOUNTER — Inpatient Hospital Stay (HOSPITAL_COMMUNITY): Payer: Self-pay | Admitting: Physician Assistant

## 2023-08-28 ENCOUNTER — Inpatient Hospital Stay (HOSPITAL_COMMUNITY): Admitting: Anesthesiology

## 2023-08-28 ENCOUNTER — Ambulatory Visit: Payer: PPO | Admitting: Nurse Practitioner

## 2023-08-28 ENCOUNTER — Other Ambulatory Visit: Payer: Self-pay

## 2023-08-28 ENCOUNTER — Encounter (HOSPITAL_COMMUNITY): Payer: Self-pay | Admitting: Neurological Surgery

## 2023-08-28 DIAGNOSIS — Z833 Family history of diabetes mellitus: Secondary | ICD-10-CM | POA: Diagnosis not present

## 2023-08-28 DIAGNOSIS — Z8249 Family history of ischemic heart disease and other diseases of the circulatory system: Secondary | ICD-10-CM | POA: Diagnosis not present

## 2023-08-28 DIAGNOSIS — Z79899 Other long term (current) drug therapy: Secondary | ICD-10-CM

## 2023-08-28 DIAGNOSIS — M4802 Spinal stenosis, cervical region: Secondary | ICD-10-CM | POA: Diagnosis not present

## 2023-08-28 DIAGNOSIS — K219 Gastro-esophageal reflux disease without esophagitis: Secondary | ICD-10-CM | POA: Diagnosis present

## 2023-08-28 DIAGNOSIS — Z90710 Acquired absence of both cervix and uterus: Secondary | ICD-10-CM

## 2023-08-28 DIAGNOSIS — Z8261 Family history of arthritis: Secondary | ICD-10-CM | POA: Diagnosis not present

## 2023-08-28 DIAGNOSIS — Z91048 Other nonmedicinal substance allergy status: Secondary | ICD-10-CM

## 2023-08-28 DIAGNOSIS — Z885 Allergy status to narcotic agent status: Secondary | ICD-10-CM | POA: Diagnosis not present

## 2023-08-28 DIAGNOSIS — M5412 Radiculopathy, cervical region: Secondary | ICD-10-CM | POA: Diagnosis not present

## 2023-08-28 DIAGNOSIS — Z822 Family history of deafness and hearing loss: Secondary | ICD-10-CM

## 2023-08-28 DIAGNOSIS — Z8262 Family history of osteoporosis: Secondary | ICD-10-CM

## 2023-08-28 DIAGNOSIS — Z881 Allergy status to other antibiotic agents status: Secondary | ICD-10-CM | POA: Diagnosis not present

## 2023-08-28 DIAGNOSIS — M50221 Other cervical disc displacement at C4-C5 level: Secondary | ICD-10-CM | POA: Diagnosis present

## 2023-08-28 DIAGNOSIS — H9193 Unspecified hearing loss, bilateral: Principal | ICD-10-CM | POA: Diagnosis present

## 2023-08-28 DIAGNOSIS — Z882 Allergy status to sulfonamides status: Secondary | ICD-10-CM | POA: Diagnosis not present

## 2023-08-28 DIAGNOSIS — M47812 Spondylosis without myelopathy or radiculopathy, cervical region: Secondary | ICD-10-CM | POA: Diagnosis present

## 2023-08-28 DIAGNOSIS — Z880 Allergy status to penicillin: Secondary | ICD-10-CM | POA: Diagnosis not present

## 2023-08-28 DIAGNOSIS — Z981 Arthrodesis status: Secondary | ICD-10-CM | POA: Diagnosis not present

## 2023-08-28 DIAGNOSIS — Z83438 Family history of other disorder of lipoprotein metabolism and other lipidemia: Secondary | ICD-10-CM | POA: Diagnosis not present

## 2023-08-28 DIAGNOSIS — Z841 Family history of disorders of kidney and ureter: Secondary | ICD-10-CM | POA: Diagnosis not present

## 2023-08-28 DIAGNOSIS — Z974 Presence of external hearing-aid: Secondary | ICD-10-CM

## 2023-08-28 DIAGNOSIS — Z87891 Personal history of nicotine dependence: Secondary | ICD-10-CM

## 2023-08-28 DIAGNOSIS — Z791 Long term (current) use of non-steroidal anti-inflammatories (NSAID): Secondary | ICD-10-CM

## 2023-08-28 HISTORY — PX: POSTERIOR CERVICAL FUSION/FORAMINOTOMY: SHX5038

## 2023-08-28 SURGERY — POSTERIOR CERVICAL FUSION/FORAMINOTOMY LEVEL 1
Anesthesia: General

## 2023-08-28 MED ORDER — KETAMINE HCL 10 MG/ML IJ SOLN
INTRAMUSCULAR | Status: DC | PRN
Start: 2023-08-28 — End: 2023-08-28
  Administered 2023-08-28: 10 mg via INTRAVENOUS
  Administered 2023-08-28: 20 mg via INTRAVENOUS

## 2023-08-28 MED ORDER — ONDANSETRON HCL 4 MG/2ML IJ SOLN
4.0000 mg | Freq: Four times a day (QID) | INTRAMUSCULAR | Status: DC | PRN
Start: 2023-08-28 — End: 2023-08-29
  Administered 2023-08-28: 4 mg via INTRAVENOUS
  Filled 2023-08-28: qty 2

## 2023-08-28 MED ORDER — DEXMEDETOMIDINE HCL IN NACL 80 MCG/20ML IV SOLN
INTRAVENOUS | Status: DC | PRN
  Administered 2023-08-28: 8 ug via INTRAVENOUS

## 2023-08-28 MED ORDER — BUPIVACAINE HCL (PF) 0.25 % IJ SOLN
INTRAMUSCULAR | Status: AC
Start: 2023-08-28 — End: 2023-08-28
  Filled 2023-08-28: qty 30

## 2023-08-28 MED ORDER — DOCUSATE SODIUM 100 MG PO CAPS
100.0000 mg | ORAL_CAPSULE | Freq: Two times a day (BID) | ORAL | Status: DC | PRN
  Administered 2023-08-29: 100 mg via ORAL
  Filled 2023-08-28: qty 1

## 2023-08-28 MED ORDER — SUGAMMADEX SODIUM 200 MG/2ML IV SOLN
INTRAVENOUS | Status: DC | PRN
  Administered 2023-08-28: 200 mg via INTRAVENOUS

## 2023-08-28 MED ORDER — GLYCOPYRROLATE PF 0.2 MG/ML IJ SOSY
PREFILLED_SYRINGE | INTRAMUSCULAR | Status: DC | PRN
Start: 2023-08-28 — End: 2023-08-28
  Administered 2023-08-28: .1 mg via INTRAVENOUS

## 2023-08-28 MED ORDER — FENTANYL CITRATE (PF) 250 MCG/5ML IJ SOLN
INTRAMUSCULAR | Status: DC | PRN
  Administered 2023-08-28: 50 ug via INTRAVENOUS

## 2023-08-28 MED ORDER — DEXAMETHASONE 4 MG PO TABS
4.0000 mg | ORAL_TABLET | Freq: Four times a day (QID) | ORAL | Status: DC
  Administered 2023-08-29 (×2): 4 mg via ORAL
  Filled 2023-08-28 (×2): qty 1

## 2023-08-28 MED ORDER — L-ARGININE 1000 MG PO TABS: 1000.0000 mg | ORAL_TABLET | Freq: Every day | ORAL | Status: DC

## 2023-08-28 MED ORDER — THROMBIN 5000 UNITS EX KIT
PACK | CUTANEOUS | Status: AC
  Filled 2023-08-28: qty 1

## 2023-08-28 MED ORDER — CALCIUM CARBONATE 1250 (500 CA) MG PO TABS: 2.0000 | ORAL_TABLET | Freq: Every day | ORAL | Status: DC

## 2023-08-28 MED ORDER — GABAPENTIN 300 MG PO CAPS
300.0000 mg | ORAL_CAPSULE | ORAL | Status: AC
  Administered 2023-08-28: 300 mg via ORAL
  Filled 2023-08-28: qty 1

## 2023-08-28 MED ORDER — HYDROMORPHONE HCL 1 MG/ML IJ SOLN
INTRAMUSCULAR | Status: AC
  Filled 2023-08-28: qty 0.5

## 2023-08-28 MED ORDER — METHOCARBAMOL 500 MG PO TABS
500.0000 mg | ORAL_TABLET | Freq: Four times a day (QID) | ORAL | Status: DC | PRN
  Administered 2023-08-29: 500 mg via ORAL
  Filled 2023-08-28 (×2): qty 1

## 2023-08-28 MED ORDER — VITAMIN C 500 MG PO TABS: 1000.0000 mg | ORAL_TABLET | Freq: Every day | ORAL | Status: DC

## 2023-08-28 MED ORDER — BACITRACIN ZINC 500 UNIT/GM EX OINT
TOPICAL_OINTMENT | CUTANEOUS | Status: AC
  Filled 2023-08-28: qty 28.35

## 2023-08-28 MED ORDER — GABAPENTIN 100 MG PO CAPS: 200.0000 mg | ORAL_CAPSULE | Freq: Every day | ORAL | Status: DC

## 2023-08-28 MED ORDER — 0.9 % SODIUM CHLORIDE (POUR BTL) OPTIME
TOPICAL | Status: DC | PRN
  Administered 2023-08-28: 1000 mL

## 2023-08-28 MED ORDER — HYDROMORPHONE HCL 1 MG/ML IJ SOLN
0.2500 mg | INTRAMUSCULAR | Status: DC | PRN
  Administered 2023-08-28 (×2): 0.5 mg via INTRAVENOUS

## 2023-08-28 MED ORDER — CEFAZOLIN SODIUM-DEXTROSE 2-4 GM/100ML-% IV SOLN
2.0000 g | Freq: Three times a day (TID) | INTRAVENOUS | Status: AC
  Administered 2023-08-28 – 2023-08-29 (×2): 2 g via INTRAVENOUS
  Filled 2023-08-28 (×3): qty 100

## 2023-08-28 MED ORDER — AMISULPRIDE (ANTIEMETIC) 5 MG/2ML IV SOLN: 10.0000 mg | Freq: Once | INTRAVENOUS | Status: DC | PRN

## 2023-08-28 MED ORDER — CHLORHEXIDINE GLUCONATE 0.12 % MT SOLN
15.0000 mL | Freq: Once | OROMUCOSAL | Status: AC
  Administered 2023-08-28: 15 mL via OROMUCOSAL
  Filled 2023-08-28: qty 15

## 2023-08-28 MED ORDER — ONDANSETRON HCL 4 MG/2ML IJ SOLN
INTRAMUSCULAR | Status: DC | PRN
  Administered 2023-08-28: 4 mg via INTRAVENOUS

## 2023-08-28 MED ORDER — SODIUM CHLORIDE 0.9 % IV SOLN
0.1500 ug/kg/min | INTRAVENOUS | Status: DC
  Administered 2023-08-28: .1 ug/kg/min via INTRAVENOUS
  Filled 2023-08-28: qty 2000

## 2023-08-28 MED ORDER — CHLORHEXIDINE GLUCONATE CLOTH 2 % EX PADS: 6.0000 | MEDICATED_PAD | Freq: Once | CUTANEOUS | Status: DC

## 2023-08-28 MED ORDER — VANCOMYCIN HCL 1000 MG IV SOLR
INTRAVENOUS | Status: AC
  Filled 2023-08-28: qty 20

## 2023-08-28 MED ORDER — THROMBIN 5000 UNITS EX KIT
PACK | CUTANEOUS | Status: AC
  Filled 2023-08-28: qty 2

## 2023-08-28 MED ORDER — PROPOFOL 10 MG/ML IV BOLUS
INTRAVENOUS | Status: DC | PRN
  Administered 2023-08-28: 20 mg via INTRAVENOUS
  Administered 2023-08-28: 110 mg via INTRAVENOUS
  Administered 2023-08-28: 20 mg via INTRAVENOUS
  Administered 2023-08-28: 40 mg via INTRAVENOUS

## 2023-08-28 MED ORDER — HYDROMORPHONE HCL 1 MG/ML IJ SOLN
INTRAMUSCULAR | Status: DC | PRN
  Administered 2023-08-28: .5 mg via INTRAVENOUS
  Administered 2023-08-28 (×2): .25 mg via INTRAVENOUS

## 2023-08-28 MED ORDER — MENTHOL 3 MG MT LOZG
1.0000 | LOZENGE | OROMUCOSAL | Status: DC | PRN
  Filled 2023-08-28: qty 9

## 2023-08-28 MED ORDER — ACETAMINOPHEN 325 MG PO TABS: 650.0000 mg | ORAL_TABLET | ORAL | Status: DC | PRN

## 2023-08-28 MED ORDER — POTASSIUM CHLORIDE IN NACL 20-0.9 MEQ/L-% IV SOLN
INTRAVENOUS | Status: AC
  Filled 2023-08-28: qty 1000

## 2023-08-28 MED ORDER — BIOTIN 1 MG PO CAPS: 1.0000 mg | ORAL_CAPSULE | Freq: Every day | ORAL | Status: DC

## 2023-08-28 MED ORDER — MIDAZOLAM HCL 2 MG/2ML IJ SOLN
INTRAMUSCULAR | Status: AC
  Filled 2023-08-28: qty 2

## 2023-08-28 MED ORDER — DEXAMETHASONE SODIUM PHOSPHATE 4 MG/ML IJ SOLN
4.0000 mg | Freq: Four times a day (QID) | INTRAMUSCULAR | Status: DC
  Administered 2023-08-28: 4 mg via INTRAVENOUS
  Filled 2023-08-28: qty 1

## 2023-08-28 MED ORDER — ORAL CARE MOUTH RINSE: 15.0000 mL | Freq: Once | OROMUCOSAL | Status: AC

## 2023-08-28 MED ORDER — OXYCODONE HCL 5 MG/5ML PO SOLN: 5.0000 mg | Freq: Once | ORAL | Status: DC | PRN

## 2023-08-28 MED ORDER — SODIUM CHLORIDE 0.9% FLUSH
3.0000 mL | Freq: Two times a day (BID) | INTRAVENOUS | Status: DC
  Administered 2023-08-28: 3 mL via INTRAVENOUS

## 2023-08-28 MED ORDER — SODIUM CHLORIDE 0.9 % IV SOLN: 250.0000 mL | INTRAVENOUS | Status: DC

## 2023-08-28 MED ORDER — PROPOFOL 10 MG/ML IV BOLUS
INTRAVENOUS | Status: AC
  Filled 2023-08-28: qty 20

## 2023-08-28 MED ORDER — HYDROMORPHONE HCL 1 MG/ML IJ SOLN
INTRAMUSCULAR | Status: AC
  Filled 2023-08-28: qty 1

## 2023-08-28 MED ORDER — IPRATROPIUM BROMIDE 0.06 % NA SOLN
2.0000 | Freq: Every day | NASAL | Status: DC
  Filled 2023-08-28: qty 15

## 2023-08-28 MED ORDER — HYDROMORPHONE HCL 1 MG/ML IJ SOLN
INTRAMUSCULAR | Status: AC
Start: 2023-08-28 — End: 2023-08-28
  Filled 2023-08-28: qty 0.5

## 2023-08-28 MED ORDER — FENTANYL CITRATE (PF) 250 MCG/5ML IJ SOLN
INTRAMUSCULAR | Status: AC
  Filled 2023-08-28: qty 5

## 2023-08-28 MED ORDER — THROMBIN 5000 UNITS EX SOLR
OROMUCOSAL | Status: DC | PRN
  Administered 2023-08-28: 5 mL via TOPICAL

## 2023-08-28 MED ORDER — PHENOL 1.4 % MT LIQD: 1.0000 | OROMUCOSAL | Status: DC | PRN

## 2023-08-28 MED ORDER — PROPOFOL 500 MG/50ML IV EMUL
INTRAVENOUS | Status: DC | PRN
  Administered 2023-08-28: 100 ug/kg/min via INTRAVENOUS

## 2023-08-28 MED ORDER — ACETAMINOPHEN 500 MG PO TABS
1000.0000 mg | ORAL_TABLET | ORAL | Status: AC
  Administered 2023-08-28: 1000 mg via ORAL
  Filled 2023-08-28: qty 2

## 2023-08-28 MED ORDER — VITAMIN D 50 MCG (2000 UT) PO CAPS: 2000.0000 [IU] | ORAL_CAPSULE | Freq: Every evening | ORAL | Status: DC

## 2023-08-28 MED ORDER — MORPHINE SULFATE (PF) 2 MG/ML IV SOLN
2.0000 mg | INTRAVENOUS | Status: DC | PRN
  Administered 2023-08-28: 2 mg via INTRAVENOUS
  Filled 2023-08-28: qty 1

## 2023-08-28 MED ORDER — THROMBIN 5000 UNITS EX SOLR
CUTANEOUS | Status: DC | PRN
  Administered 2023-08-28: 10000 [IU] via TOPICAL

## 2023-08-28 MED ORDER — OXYCODONE HCL 5 MG PO TABS: 5.0000 mg | ORAL_TABLET | Freq: Once | ORAL | Status: DC | PRN

## 2023-08-28 MED ORDER — ONDANSETRON HCL 4 MG PO TABS: 4.0000 mg | ORAL_TABLET | Freq: Four times a day (QID) | ORAL | Status: DC | PRN

## 2023-08-28 MED ORDER — MIDAZOLAM HCL 2 MG/2ML IJ SOLN
INTRAMUSCULAR | Status: DC | PRN
  Administered 2023-08-28 (×2): 1 mg via INTRAVENOUS

## 2023-08-28 MED ORDER — LACTATED RINGERS IV SOLN: INTRAVENOUS | Status: DC

## 2023-08-28 MED ORDER — ACETAMINOPHEN 650 MG RE SUPP: 650.0000 mg | RECTAL | Status: DC | PRN

## 2023-08-28 MED ORDER — OXCARBAZEPINE 150 MG PO TABS
150.0000 mg | ORAL_TABLET | Freq: Two times a day (BID) | ORAL | Status: DC
  Filled 2023-08-28 (×2): qty 1

## 2023-08-28 MED ORDER — ROCURONIUM BROMIDE 10 MG/ML (PF) SYRINGE
PREFILLED_SYRINGE | INTRAVENOUS | Status: DC | PRN
  Administered 2023-08-28: 20 mg via INTRAVENOUS
  Administered 2023-08-28: 50 mg via INTRAVENOUS

## 2023-08-28 MED ORDER — SODIUM CHLORIDE 0.9% FLUSH
3.0000 mL | INTRAVENOUS | Status: DC | PRN
Start: 2023-08-28 — End: 2023-08-29

## 2023-08-28 MED ORDER — VITAMIN D 25 MCG (1000 UNIT) PO TABS
1000.0000 [IU] | ORAL_TABLET | Freq: Every day | ORAL | Status: DC
  Administered 2023-08-29: 1000 [IU] via ORAL
  Filled 2023-08-28: qty 1

## 2023-08-28 MED ORDER — KETAMINE HCL 50 MG/5ML IJ SOSY
PREFILLED_SYRINGE | INTRAMUSCULAR | Status: AC
  Filled 2023-08-28: qty 5

## 2023-08-28 MED ORDER — GABAPENTIN 300 MG PO CAPS: 300.0000 mg | ORAL_CAPSULE | ORAL | Status: DC

## 2023-08-28 MED ORDER — SODIUM CHLORIDE 0.9 % IV SOLN: 12.5000 mg | INTRAVENOUS | Status: DC | PRN

## 2023-08-28 MED ORDER — MIRABEGRON ER 25 MG PO TB24: 25.0000 mg | ORAL_TABLET | Freq: Every day | ORAL | Status: DC

## 2023-08-28 MED ORDER — HYDROXYZINE HCL 50 MG/ML IM SOLN
50.0000 mg | Freq: Four times a day (QID) | INTRAMUSCULAR | Status: DC | PRN
  Administered 2023-08-28: 50 mg via INTRAMUSCULAR
  Filled 2023-08-28: qty 1

## 2023-08-28 MED ORDER — OXYCODONE HCL 5 MG PO TABS
5.0000 mg | ORAL_TABLET | ORAL | Status: DC | PRN
  Administered 2023-08-29: 5 mg via ORAL
  Filled 2023-08-28 (×2): qty 1

## 2023-08-28 MED ORDER — LIDOCAINE 2% (20 MG/ML) 5 ML SYRINGE
INTRAMUSCULAR | Status: DC | PRN
  Administered 2023-08-28: 60 mg via INTRAVENOUS

## 2023-08-28 MED ORDER — METHOCARBAMOL 1000 MG/10ML IJ SOLN: 500.0000 mg | Freq: Four times a day (QID) | INTRAMUSCULAR | Status: DC | PRN

## 2023-08-28 MED ORDER — DEXAMETHASONE SODIUM PHOSPHATE 10 MG/ML IJ SOLN
INTRAMUSCULAR | Status: DC | PRN
  Administered 2023-08-28: 4 mg via INTRAVENOUS

## 2023-08-28 MED ORDER — SENNA 8.6 MG PO TABS
1.0000 | ORAL_TABLET | Freq: Two times a day (BID) | ORAL | Status: DC
  Administered 2023-08-29: 8.6 mg via ORAL
  Filled 2023-08-28: qty 1

## 2023-08-28 MED ORDER — LACTATED RINGERS IV SOLN: INTRAVENOUS | Status: DC | PRN

## 2023-08-28 MED ORDER — CEFAZOLIN SODIUM-DEXTROSE 2-4 GM/100ML-% IV SOLN
2.0000 g | INTRAVENOUS | Status: AC
  Administered 2023-08-28: 2 g via INTRAVENOUS
  Filled 2023-08-28: qty 100

## 2023-08-28 SURGICAL SUPPLY — 49 items
ALLOGRAFT BONESTRIP KORE 2.5X5 (Bone Implant) IMPLANT
BAG COUNTER SPONGE SURGICOUNT (BAG) ×1 IMPLANT
BAND RUBBER #18 3X1/16 STRL (MISCELLANEOUS) ×2 IMPLANT
BENZOIN TINCTURE PRP APPL 2/3 (GAUZE/BANDAGES/DRESSINGS) ×1 IMPLANT
BIT DRILL INVICTUS SUB 2.1 STR (BIT) IMPLANT
BLADE CLIPPER SURG (BLADE) IMPLANT
BUR CARBIDE MATCH 3.0 (BURR) IMPLANT
CANISTER SUCTION 3000ML PPV (SUCTIONS) ×1 IMPLANT
DERMABOND ADVANCED .7 DNX12 (GAUZE/BANDAGES/DRESSINGS) IMPLANT
DRAPE C-ARM 42X72 X-RAY (DRAPES) ×2 IMPLANT
DRAPE LAPAROTOMY 100X72 PEDS (DRAPES) ×1 IMPLANT
DRSG OPSITE POSTOP 4X6 (GAUZE/BANDAGES/DRESSINGS) IMPLANT
DURAPREP 26ML APPLICATOR (WOUND CARE) ×1 IMPLANT
ELECTRODE REM PT RTRN 9FT ADLT (ELECTROSURGICAL) ×1 IMPLANT
EVACUATOR 1/8 PVC DRAIN (DRAIN) IMPLANT
GAUZE 4X4 16PLY ~~LOC~~+RFID DBL (SPONGE) IMPLANT
GLOVE BIO SURGEON STRL SZ7 (GLOVE) IMPLANT
GLOVE BIO SURGEON STRL SZ8 (GLOVE) ×1 IMPLANT
GLOVE BIOGEL PI IND STRL 7.0 (GLOVE) IMPLANT
GOWN STRL REUS W/ TWL LRG LVL3 (GOWN DISPOSABLE) IMPLANT
GOWN STRL REUS W/ TWL XL LVL3 (GOWN DISPOSABLE) ×1 IMPLANT
GOWN STRL REUS W/TWL 2XL LVL3 (GOWN DISPOSABLE) IMPLANT
HEMOSTAT POWDER KIT SURGIFOAM (HEMOSTASIS) IMPLANT
KIT BASIN OR (CUSTOM PROCEDURE TRAY) ×1 IMPLANT
KIT TURNOVER KIT B (KITS) ×1 IMPLANT
MARKER SKIN DUAL TIP RULER LAB (MISCELLANEOUS) ×1 IMPLANT
NDL HYPO 25X1 1.5 SAFETY (NEEDLE) ×1 IMPLANT
NDL SPNL 20GX3.5 QUINCKE YW (NEEDLE) IMPLANT
NEEDLE HYPO 25X1 1.5 SAFETY (NEEDLE) ×1 IMPLANT
NEEDLE SPNL 20GX3.5 QUINCKE YW (NEEDLE) IMPLANT
NS IRRIG 1000ML POUR BTL (IV SOLUTION) ×1 IMPLANT
PACK LAMINECTOMY NEURO (CUSTOM PROCEDURE TRAY) ×1 IMPLANT
PAD ARMBOARD POSITIONER FOAM (MISCELLANEOUS) ×1 IMPLANT
PATTIES SURGICAL .5 X.5 (GAUZE/BANDAGES/DRESSINGS) ×1 IMPLANT
PATTIES SURGICAL 1X1 (DISPOSABLE) ×1 IMPLANT
PIN MAYFIELD SKULL DISP (PIN) ×1 IMPLANT
ROD LORD TI 3.5X25 (Rod) IMPLANT
SCREW POLYAXIAL 3.5 X 14 (Screw) IMPLANT
SCREW SET ATEC (Screw) IMPLANT
SPONGE SURGIFOAM ABS GEL SZ50 (HEMOSTASIS) ×1 IMPLANT
SPONGE T-LAP 4X18 ~~LOC~~+RFID (SPONGE) IMPLANT
STRIP CLOSURE SKIN 1/2X4 (GAUZE/BANDAGES/DRESSINGS) ×1 IMPLANT
SUT VIC AB 0 CT1 18XCR BRD8 (SUTURE) ×1 IMPLANT
SUT VIC AB 2-0 CP2 18 (SUTURE) ×1 IMPLANT
SUT VIC AB 3-0 SH 8-18 (SUTURE) ×1 IMPLANT
SYR 30ML SLIP (SYRINGE) ×1 IMPLANT
TOWEL GREEN STERILE (TOWEL DISPOSABLE) ×1 IMPLANT
TOWEL GREEN STERILE FF (TOWEL DISPOSABLE) ×1 IMPLANT
WATER STERILE IRR 1000ML POUR (IV SOLUTION) ×1 IMPLANT

## 2023-08-28 NOTE — Op Note (Signed)
 08/28/2023  5:52 PM  PATIENT:  Helen Glover  73 y.o. female  PRE-OPERATIVE DIAGNOSIS: Cervical spinal stenosis C4-5 with dorsal epidural ring-enhancing mass, neck pain and left arm pain  POST-OPERATIVE DIAGNOSIS:  same  PROCEDURE:  1.  Decompressive cervical laminectomy medial facetectomy foraminotomies C4-5 bilaterally, 2.  Lateral mass arthrodesis C4-5 utilizing locally harvested morselized autologous bone graft and morselized allograft, 3.  Nonsegmental fixation C4-5 utilizing Alphatec lateral mass screws  SURGEON:  Alm Molt, MD  ASSISTANTS: Suzen Pean, FNP  ANESTHESIA:   General  EBL: 25 ml  Total I/O In: 1100 [I.V.:1000; IV Piggyback:100] Out: 25 [Blood:25]  BLOOD ADMINISTERED: none  DRAINS: med  hemovac  SPECIMEN:  none  INDICATION FOR PROCEDURE: This patient presented with neck pain with left arm pain. Imaging showed cervical spinal stenosis C4-5 with a dorsal epidural mass that was ring-enhancing.  Sed rate and CRP were normal.  Concern was for possible abscess we recommended decompressive laminectomy for resection of the lesion followed by instrumented fusion.. The patient tried conservative measures without relief. Pain was debilitating. Patient understood the risks, benefits, and alternatives and potential outcomes and wished to proceed.  PROCEDURE DETAILS: The patient was brought to the operating room. Generalized endotracheal anesthesia was induced. The patient was affixed a 3 point Mayfield headrest and rolled into the prone position on chest rolls. All pressure points were padded. The posterior cervical region was cleaned and prepped with DuraPrep and then draped in the usual sterile fashion. 7 cc of local anesthesia was injected and a dorsal midline incision made in the posterior cervical region and carried down to the cervical fascia. The fascia was opened and the paraspinous musculature was taken down to expose C4-5. Intraoperative fluoroscopy confirmed my  level and then the dissection was carried out over the lateral facets. I localized the midpoint of each lateral mass and marked a region 1 mm medial to the midpoint of the lateral mass, and then drilled in an upward and outward direction into the safe zone of each lateral mass. I drilled to a depth of 12 mm and then checked my drill hole with a ball probe. I then placed a 14 mm lateral mass screws into the safe zone of each lateral mass of C4 and C5 until they were 2 fingers tight. I then removed the spinous process of C4.  I then gently decompressed the central canal with the 1 and 2 mm Kerrison punch from C4 to C5. Medial facetectomies were performed, and foraminotomies were performed at C4-5 on the left.  There was a white sticky fluid like substance underneath the yellow ligament and therefore we cultured this.  It did not appear to be coming from the facet.  Once the decompression was complete the dura was full and capacious and I could see the spinal cord pulsatile through the dura. I then decorticated the lateral masses and the facet joints and packed them with local autograft and morcellized allograft to perform arthrodesis from C4-5. I then placed rods into the multiaxial screw heads of the screws and locked these into position with the locking caps and anti-torque device. I then checked the final construct with AP/Lat fluoroscopy. I irrigated with saline solution containing bacitracin . I placed a medium Hemovac drain through separate stab incision, and lined the dura with Gelfoam. After hemostasis was achieved I closed the muscle and the fascia with 0 Vicryl, subcutaneous tissue with 2-0 Vicryl, and the subcuticular tissue with 3-0 Vicryl. The skin was closed with benzoin  and Steri-Strips. A sterile dressing was applied, the patient was turned to the supine position and taken out of the headrest, awakened from general anesthesia and transferred to the recovery room in stable condition. At the end of the  procedure all sponge, needle and instrument counts were correct.    PLAN OF CARE: Admit to inpatient   PATIENT DISPOSITION:  PACU - hemodynamically stable.   Delay start of Pharmacological VTE agent (>24hrs) due to surgical blood loss or risk of bleeding:  yes

## 2023-08-28 NOTE — Transfer of Care (Signed)
 Immediate Anesthesia Transfer of Care Note  Patient: Helen Glover  Procedure(s) Performed: CERVICAL FOUR-FIVE POSTERIOR CERVICAL FUSION  Patient Location: PACU  Anesthesia Type:General  Level of Consciousness: awake, alert , and oriented  Airway & Oxygen Therapy: Patient Spontanous Breathing and Patient connected to face mask oxygen  Post-op Assessment: Report given to RN, Post -op Vital signs reviewed and stable, and Patient moving all extremities X 4  Post vital signs: Reviewed and stable  Last Vitals:  Vitals Value Taken Time  BP 128/74 08/28/23 18:15  Temp    Pulse 74 08/28/23 18:19  Resp 12 08/28/23 18:19  SpO2 94 % 08/28/23 18:19  Vitals shown include unfiled device data.  Last Pain:  Vitals:   08/28/23 1816  TempSrc:   PainSc: 7       Patients Stated Pain Goal: 3 (08/28/23 1323)  Complications: No notable events documented.

## 2023-08-28 NOTE — Anesthesia Preprocedure Evaluation (Signed)
 Anesthesia Evaluation  Patient identified by MRN, date of birth, ID band Patient awake    Reviewed: Allergy & Precautions, NPO status , Patient's Chart, lab work & pertinent test results  Airway Mallampati: II  TM Distance: >3 FB Neck ROM: Full    Dental  (+) Dental Advisory Given   Pulmonary former smoker   breath sounds clear to auscultation       Cardiovascular hypertension, Pt. on medications  Rhythm:Regular Rate:Normal     Neuro/Psych   Anxiety Depression       GI/Hepatic Neg liver ROS,GERD  ,,  Endo/Other    Renal/GU Renal disease     Musculoskeletal  (+) Arthritis , Osteoarthritis,    Abdominal   Peds  Hematology   Anesthesia Other Findings   Reproductive/Obstetrics                              Anesthesia Physical Anesthesia Plan  ASA: II  Anesthesia Plan: General   Post-op Pain Management: Tylenol  PO (pre-op)* and Gabapentin  PO (pre-op)*   Induction: Intravenous  PONV Risk Score and Plan: 3 and Ondansetron , Dexamethasone , Midazolam  and Treatment may vary due to age or medical condition  Airway Management Planned: Oral ETT  Additional Equipment:   Intra-op Plan:   Post-operative Plan: Extubation in OR  Informed Consent: I have reviewed the patients History and Physical, chart, labs and discussed the procedure including the risks, benefits and alternatives for the proposed anesthesia with the patient or authorized representative who has indicated his/her understanding and acceptance.     Dental advisory given  Plan Discussed with: CRNA and Anesthesiologist  Anesthesia Plan Comments:          Anesthesia Quick Evaluation

## 2023-08-28 NOTE — Anesthesia Procedure Notes (Signed)
 Procedure Name: Intubation Date/Time: 08/28/2023 4:08 PM  Performed by: Mollie Olivia SAUNDERS, CRNAPre-anesthesia Checklist: Patient identified, Emergency Drugs available, Suction available and Patient being monitored Patient Re-evaluated:Patient Re-evaluated prior to induction Oxygen Delivery Method: Circle system utilized Preoxygenation: Pre-oxygenation with 100% oxygen Induction Type: IV induction Ventilation: Mask ventilation without difficulty Laryngoscope Size: Glidescope and 3 Grade View: Grade III Tube type: Oral Tube size: 7.0 mm Number of attempts: 1 Airway Equipment and Method: Stylet Placement Confirmation: ETT inserted through vocal cords under direct vision, positive ETCO2 and breath sounds checked- equal and bilateral Secured at: 22 cm Tube secured with: Tape Dental Injury: Teeth and Oropharynx as per pre-operative assessment  Difficulty Due To: Difficulty was anticipated, Difficult Airway- due to anterior larynx, Difficult Airway- due to limited oral opening, Difficult Airway-  due to neck instability and Difficult Airway- due to reduced neck mobility Future Recommendations: Recommend- induction with short-acting agent, and alternative techniques readily available

## 2023-08-28 NOTE — H&P (Signed)
 Subjective:   Patient is a 73 y.o. female admitted for neck pain and L arm pain. The patient first presented to me with complaints of neck pain, shooting pains in the arm(s), and numbness of the arm(s). Onset of symptoms was several months ago. The pain is described as aching and occurs all day. The pain is rated severe, and is located in the neck and radiates to the L arm. The symptoms have been progressive. Symptoms are exacerbated by extending head backwards, and are relieved by none.  Previous work up includes MRI of cervical spine, results: spinal stenosis.  Past Medical History:  Diagnosis Date   Allergy    Anxiety    Arthritis    in neck   Bursitis of right hip    Carpal tunnel syndrome, bilateral    rt wrist   Cataract    Cervical vertebral fusion    C1-2 posterior fusion   Complete rotator cuff tear of left shoulder    Depression    Difficult intubation 2015   pt states she was told she was a difficult intubation several years ago but has had several intubations since then with no issues   Eagle's syndrome    GERD (gastroesophageal reflux disease)    Hearing loss of both ears 12/25/2014   Has hearing aids/tubes    Heart murmur    as child   HNP (herniated nucleus pulposus)    Ingrown toenail of right foot 2023   Interstitial cystitis    Kidney cysts    followed by Dr. Cam   Osteopenia    Plantar fasciitis    Spinal stenosis    Tingling    rt foot   Wears hearing aid     Past Surgical History:  Procedure Laterality Date   ABDOMINAL HYSTERECTOMY  2003   BSO-fibroids   ANAL SPHINCTEROTOMY     CARPAL TUNNEL RELEASE     LEFT   CARPAL TUNNEL RELEASE Right 2025   and trigger finger   CERVICAL FUSION  02/25/2018   c1-c2 with laminectomy   CESAREAN SECTION     CYSTOSCOPY     HEMORRHOID SURGERY N/A 1998   KNEE ARTHROSCOPY Right 06/22/2019   Dr. Kay    LUMBAR LAMINECTOMY     LUMBAR LAMINECTOMY/DECOMPRESSION MICRODISCECTOMY N/A 08/09/2014   Procedure:  MICRO LUMBAR DECOMPRESSION L3-4,  REDO DECOMPRESSION, MICRODISCECTOMY L4-5, FORAMINOTOMY L3-L4, L4-L5 BILATERAL;  Surgeon: Reyes Billing, MD;  Location: [facility];  Service: Orthopedics;  Laterality: N/A;   MYRINGOTOMY     NASAL SINUS SURGERY     SPINE SURGERY     TONSILLECTOMY     LEFT only, due to Eagle Syndrome   TONSILLECTOMY  11/2012   right side due to Eagles syndrome   TRIGGER FINGER RELEASE     RIGHT   TYMPANOSTOMY TUBE PLACEMENT      Allergies  Allergen Reactions   Amoxicillin -Pot Clavulanate Other (See Comments)   Levofloxacin Diarrhea   Other     Band-aid   Codeine Hives   Sulfa Antibiotics Nausea And Vomiting    Social History   Tobacco Use   Smoking status: Former    Current packs/day: 0.00    Average packs/day: 0.5 packs/day for 15.0 years (7.5 ttl pk-yrs)    Types: Cigarettes    Start date: 12/04/1971    Quit date: 12/04/1986    Years since quitting: 36.7   Smokeless tobacco: Never  Substance Use Topics   Alcohol use: Yes    Alcohol/week: 2.0  standard drinks of alcohol    Types: 2 Glasses of wine per week    Comment: once/wk-1-2    Family History  Problem Relation Age of Onset   Diabetes Mother    Hypertension Mother    Hyperlipidemia Mother    Osteoporosis Mother    Arthritis Mother    Hearing loss Mother    Kidney disease Mother    Hypertension Brother    Hyperlipidemia Brother    Prostate cancer Father        Bone   Cancer Father    Alcohol abuse Paternal Uncle    Alcohol abuse Paternal Uncle    Prior to Admission medications   Medication Sig Start Date End Date Taking? Authorizing Provider  Ascorbic Acid  (VITAMIN C ) 100 MG tablet Take 1,000 mg by mouth daily.   Yes [provider]  Biotin  1 MG CAPS Take 1 mg by mouth daily.   Yes [provider]  calcium  carbonate (OS-CAL - DOSED IN MG OF ELEMENTAL CALCIUM ) 1250 (500 Ca) MG tablet Take 2 tablets by mouth daily with breakfast.   Yes [provider]  cholecalciferol   (VITAMIN D ) 1000 UNITS tablet Take 1,000 Units by mouth daily.   Yes [provider]  Cholecalciferol  (VITAMIN D ) 50 MCG (2000 UT) CAPS Take 2,000 Units by mouth every evening.   Yes [provider]  estradiol  (ESTRACE ) 0.1 MG/GM vaginal cream Place 1 Applicatorful vaginally once a week. 10/27/22  Yes [provider]  estradiol  (VIVELLE -DOT) 0.025 MG/24HR Place 1 patch onto the skin 2 (two) times a week. 01/12/23  Yes Cleotilde Ronal RAMAN, MD  gabapentin  (NEURONTIN ) 100 MG capsule Take 200 mg by mouth at bedtime.   Yes [provider]  GEMTESA 75 MG TABS Take 75 mg by mouth daily.   Yes [provider]  hydrocortisone  (ANUSOL -HC) 2.5 % rectal cream Place 1 Application rectally 2 (two) times daily. Patient taking differently: Place 1 Application rectally 2 (two) times daily as needed for hemorrhoids. 12/09/21  Yes Wilkinson, Dana E, FNP  ipratropium (ATROVENT ) 0.06 % nasal spray Place 2 sprays into both nostrils 4 (four) times daily as needed for rhinitis. Patient taking differently: Place 2 sprays into both nostrils daily. 07/20/23  Yes Wendolyn Jenkins Jansky, MD  L-Arginine  1000 MG TABS Take 1,000 mg by mouth daily.   Yes [provider]  meloxicam  (MOBIC ) 15 MG tablet Take  1/2 to 1 tablet  Daily  with Food  for Pain & Inflammation 07/20/23  Yes Wendolyn Jenkins Jansky, MD  omeprazole  (PRILOSEC) 20 MG capsule Take 1 capsule (20 mg total) by mouth daily. 07/20/23  Yes Wendolyn Jenkins Jansky, MD  OXcarbazepine  (TRILEPTAL ) 150 MG tablet Take 1 tablet (150 mg total) by mouth 2 (two) times daily. 08/25/23  Yes Sater, Charlie LABOR, MD  Probiotic Product (PROBIOTIC PO) Take 1 tablet by mouth daily.   Yes [provider]  Pyridoxine HCl (B-6 PO) Take 1 tablet by mouth daily.   Yes [provider]  methylPREDNISolone  (MEDROL  DOSEPAK) 4 MG TBPK tablet Please take per packaging instructions. Patient not taking: Reported on 08/19/2023 07/28/23   Wendolyn Jenkins Jansky, MD      Review of Systems  Positive ROS: neg  All other systems have been reviewed and were otherwise negative with the exception of those mentioned in the HPI and as above.  Objective: Vital signs in last 24 hours: Temp:  [98 F (36.7 C)] 98 F (36.7 C) (07/11 1246) Pulse Rate:  [  69] 69 (07/11 1246) Resp:  [18] 18 (07/11 1246) BP: (135)/(66) 135/66 (07/11 1246) SpO2:  [99 %] 99 % (07/11 1246) Weight:  [48.1 kg] 48.1 kg (07/11 1246)  General Appearance: Alert, cooperative, no distress, appears stated age Head: Normocephalic, without obvious abnormality, atraumatic Eyes: PERRL, conjunctiva/corneas clear, EOM's intact      Neck: Supple, symmetrical, trachea midline, Back: Symmetric, no curvature, ROM normal, no CVA tenderness Lungs:  respirations unlabored Heart: Regular rate and rhythm Abdomen: Soft, non-tender Extremities: Extremities normal, atraumatic, no cyanosis or edema Pulses: 2+ and symmetric all extremities Skin: Skin color, texture, turgor normal, no rashes or lesions  NEUROLOGIC:  Mental status: Alert and oriented x4, no aphasia, good attention span, fund of knowledge and memory  Motor Exam - grossly normal Sensory Exam - grossly normal Reflexes: 1+ Coordination - grossly normal Gait - grossly normal Balance - grossly normal Cranial Nerves: I: smell Not tested  II: visual acuity  OS: nl    OD: nl  II: visual fields Full to confrontation  II: pupils Equal, round, reactive to light  III,VII: ptosis None  III,IV,VI: extraocular muscles  Full ROM  V: mastication Normal  V: facial light touch sensation  Normal  V,VII: corneal reflex  Present  VII: facial muscle function - upper  Normal  VII: facial muscle function - lower Normal  VIII: hearing Not tested  IX: soft palate elevation  Normal  IX,X: gag reflex Present  XI: trapezius strength  5/5  XI: sternocleidomastoid strength 5/5  XI: neck flexion strength  5/5  XII: tongue strength  Normal    Data  Review Lab Results  Component Value Date   WBC 7.8 08/25/2023   HGB 13.3 08/25/2023   HCT 39.5 08/25/2023   MCV 95.6 08/25/2023   PLT 288 08/25/2023   Lab Results  Component Value Date   NA 138 08/25/2023   K 4.4 08/25/2023   CL 101 08/25/2023   CO2 27 08/25/2023   BUN 11 08/25/2023   CREATININE 0.63 08/25/2023   GLUCOSE 96 08/25/2023   Lab Results  Component Value Date   INR 1.0 09/16/2006    Assessment:   Cervical neck pain with herniated nucleus pulposus/ spondylosis/ stenosis at C4-5. Estimated body mass index is 21.78 kg/m as calculated from the following:   Height as of this encounter: 4' 10.5 (1.486 m).   Weight as of this encounter: 48.1 kg.  Patient has failed conservative therapy. Planned surgery : CL/ PCF C4-5  Plan:   I explained the condition and procedure to the patient and answered any questions.  Patient wishes to proceed with procedure as planned. Understands risks/ benefits/ and expected or typical outcomes.  Alm GORMAN Molt 08/28/2023 2:46 PM

## 2023-08-29 MED ORDER — OXYCODONE-ACETAMINOPHEN 5-325 MG PO TABS: 1.0000 | ORAL_TABLET | ORAL | 0 refills | Status: DC | PRN

## 2023-08-29 MED ORDER — POLYETHYLENE GLYCOL 3350 17 G PO PACK: 17.0000 g | PACK | Freq: Every day | ORAL | 0 refills | Status: DC

## 2023-08-29 MED ORDER — OXYCODONE HCL 5 MG PO TABS
5.0000 mg | ORAL_TABLET | ORAL | Status: DC | PRN
  Administered 2023-08-29: 10 mg via ORAL
  Administered 2023-08-29: 5 mg via ORAL
  Filled 2023-08-29 (×2): qty 2

## 2023-08-29 MED ORDER — METHOCARBAMOL 500 MG PO TABS: 500.0000 mg | ORAL_TABLET | Freq: Four times a day (QID) | ORAL | 0 refills | Status: DC | PRN

## 2023-08-29 NOTE — Discharge Summary (Signed)
 Physician Discharge Summary     Providing Compassionate, Quality Care - Together   Patient ID: Helen Glover MRN: [id number] DOB/AGE: 24-Apr-1950 73 y.o.  Admit date: 08/28/2023 Discharge date: 08/29/2023  Admission Diagnoses:   Discharge Diagnoses: Cervical spinal stenosis Principal Problem:   S/P cervical spinal fusion   Discharged Condition: good  Hospital Course: Patient underwent a posterior cervical decompression and fusion at C4-5 by Dr. Joshua on 08/28/2023. She was admitted to 3C06  following recovery from anesthesia in the PACU. Her postoperative course has been uncomplicated. She has worked with occupational therapy who feels the patient is ready for discharge home. She is ambulating independently and without difficulty. She is tolerating a normal diet. She is not having any bowel or bladder dysfunction. Her pain is well-controlled with oral pain medication. She is ready for discharge home.   Consults: None  Significant Diagnostic Studies: radiology: DG C-Arm 1-60 Min-No Report Result Date: 08/28/2023 Fluoroscopy was utilized by the requesting physician.  No radiographic interpretation.   Results for orders placed or performed during the hospital encounter of 08/28/23 (from the past 24 hours)  Aerobic/Anaerobic Culture w Gram Stain (surgical/deep wound)     Status: None (Preliminary result)   Collection Time: 08/28/23  5:01 PM   Specimen: Wound; Abscess  Result Value Ref Range   Specimen Description WOUND    Special Requests ABSCESS    Gram Stain      NO WBC SEEN NO ORGANISMS SEEN Performed at [facility], [address], [place], [place] [address]    Culture PENDING    Report Status PENDING       Treatments: surgery:  1.  Decompressive cervical laminectomy medial facetectomy foraminotomies C4-5 bilaterally, 2.  Lateral mass arthrodesis C4-5 utilizing locally harvested morselized autologous bone graft and morselized allograft, 3.  Nonsegmental fixation  C4-5 utilizing Alphatec lateral mass screws   Discharge Exam: Blood pressure (!) 120/59, pulse 76, temperature 97.6 F (36.4 C), temperature source Oral, resp. rate 20, height 4' 10.5 (1.486 m), weight 48.1 kg, last menstrual period 10/18/2001, SpO2 100%.  Alert and oriented x 4 PERRLA CN II-XII grossly intact MAE, Strength and sensation intact Incision is covered with Honeycomb dressing and Steri Strips; Dressing is clean, dry, and intact   Disposition: Discharge disposition: 01-Home or Self Care       Discharge Instructions     Call MD for:  difficulty breathing, headache or visual disturbances   Complete by: As directed    Call MD for:  hives   Complete by: As directed    Call MD for:  persistant nausea and vomiting   Complete by: As directed    Call MD for:  redness, tenderness, or signs of infection (pain, swelling, redness, odor or green/yellow discharge around incision site)   Complete by: As directed    Call MD for:  severe uncontrolled pain   Complete by: As directed    Diet - low sodium heart healthy   Complete by: As directed    If the dressing is still on your incision site when you go home, remove it on the third day after your surgery date. Remove dressing if it begins to fall off, or if it is dirty or damaged before the third day.   Complete by: As directed    Increase activity slowly   Complete by: As directed       Allergies as of 08/29/2023       Reactions   Amoxicillin -pot Clavulanate  Other (See Comments)   Levofloxacin Diarrhea   Other    Band-aid   Codeine Hives   Sulfa Antibiotics Nausea And Vomiting        Medication List     TAKE these medications    B-6 PO Take 1 tablet by mouth daily.   Biotin  1 MG Caps Take 1 mg by mouth daily.   calcium  carbonate 1250 (500 Ca) MG tablet Commonly known as: OS-CAL - dosed in mg of elemental calcium  Take 2 tablets by mouth daily with breakfast.   cholecalciferol  1000 units tablet Commonly  known as: VITAMIN D  Take 1,000 Units by mouth daily.   Vitamin D  50 MCG (2000 UT) Caps Take 2,000 Units by mouth every evening.   estradiol  0.025 MG/24HR Commonly known as: VIVELLE -DOT Place 1 patch onto the skin 2 (two) times a week.   estradiol  0.1 MG/GM vaginal cream Commonly known as: ESTRACE  Place 1 Applicatorful vaginally once a week.   gabapentin  100 MG capsule Commonly known as: NEURONTIN  Take 200 mg by mouth at bedtime.   Gemtesa 75 MG Tabs Generic drug: Vibegron Take 75 mg by mouth daily.   hydrocortisone  2.5 % rectal cream Commonly known as: Anusol -HC Place 1 Application rectally 2 (two) times daily. What changed:  when to take this reasons to take this   ipratropium 0.06 % nasal spray Commonly known as: ATROVENT  Place 2 sprays into both nostrils 4 (four) times daily as needed for rhinitis. What changed: when to take this   L-Arginine  1000 MG Tabs Take 1,000 mg by mouth daily.   meloxicam  15 MG tablet Commonly known as: MOBIC  Take  1/2 to 1 tablet  Daily  with Food  for Pain & Inflammation   methocarbamol  500 MG tablet Commonly known as: ROBAXIN  Take 1 tablet (500 mg total) by mouth every 6 (six) hours as needed for muscle spasms.   methylPREDNISolone  4 MG Tbpk tablet Commonly known as: MEDROL  DOSEPAK Please take per packaging instructions.   omeprazole  20 MG capsule Commonly known as: PRILOSEC Take 1 capsule (20 mg total) by mouth daily.   OXcarbazepine  150 MG tablet Commonly known as: TRILEPTAL  Take 1 tablet (150 mg total) by mouth 2 (two) times daily.   oxyCODONE -acetaminophen  5-325 MG tablet Commonly known as: Percocet Take 1-2 tablets by mouth every 4 (four) hours as needed.   polyethylene glycol 17 g packet Commonly known as: MiraLax  Take 17 g by mouth daily.   PROBIOTIC PO Take 1 tablet by mouth daily.   vitamin C  100 MG tablet Take 1,000 mg by mouth daily.               Discharge Care Instructions  (From admission,  onward)           Start     Ordered   08/29/23 0000  If the dressing is still on your incision site when you go home, remove it on the third day after your surgery date. Remove dressing if it begins to fall off, or if it is dirty or damaged before the third day.        08/29/23 9047            Follow-up Information     Joshua Alm Hamilton, MD Follow up.   Specialty: Neurosurgery Contact information: [address] [place] [address] [phone]                 Signed: Gerard Beck, DNP, AGNP-C Nurse Practitioner  [facility] [address], [place], [place] [address] P: [phone]    F: [phone]  08/29/2023, 9:52 AM

## 2023-08-29 NOTE — Evaluation (Signed)
 Occupational Therapy Evaluation Patient Details Name: KENLEE VOGT MRN: [id number] DOB: 1950/04/09 Today's Date: 08/29/2023   History of Present Illness   Pt is a 73 y/o F s/p C4-5 bil ACDF on 7/11. PMH includes anxiety, arthritis, cataracts, GERD, neck surgery, back surgery     Clinical Impressions Pt ind at baseline with ADL/functional mobility, lives alone but her daughter plans to stay with her for a few days. Pt educated on cervical precautions and compensatory strategies for ADL/mobility, pt able to verbalize and demo understanding. Pt performing ADLs without assist and mod I for mobility. Pt presenting with impairments listed below, will follow acutely. Anticipate no OT follow up needs at d/c.      If plan is discharge home, recommend the following:   A little help with walking and/or transfers;A little help with bathing/dressing/bathroom;Assistance with cooking/housework;Assist for transportation     Functional Status Assessment   Patient has had a recent decline in their functional status and demonstrates the ability to make significant improvements in function in a reasonable and predictable amount of time.     Equipment Recommendations   None recommended by OT     Recommendations for Other Services         Precautions/Restrictions   Precautions Precautions: Cervical Precaution Booklet Issued: Yes (comment) Recall of Precautions/Restrictions: Intact Required Braces or Orthoses:  (no brace needed per MD order) Restrictions Weight Bearing Restrictions Per Provider Order: No     Mobility Bed Mobility Overal bed mobility: Modified Independent             General bed mobility comments: log roll technique    Transfers Overall transfer level: Modified independent                        Balance Overall balance assessment: No apparent balance deficits (not formally assessed)                                          ADL either performed or assessed with clinical judgement   ADL Overall ADL's : At baseline                                       General ADL Comments: performing ADLs utilizing compensatory strategies without assist     Vision Baseline Vision/History: 1 Wears glasses Vision Assessment?: No apparent visual deficits     Perception Perception: Not tested       Praxis Praxis: Not tested       Pertinent Vitals/Pain Pain Assessment Pain Assessment: No/denies pain     Extremity/Trunk Assessment Upper Extremity Assessment Upper Extremity Assessment: Overall WFL for tasks assessed   Lower Extremity Assessment Lower Extremity Assessment: Overall WFL for tasks assessed   Cervical / Trunk Assessment Cervical / Trunk Assessment: Neck Surgery   Communication Communication Communication: No apparent difficulties   Cognition Arousal: Alert Behavior During Therapy: WFL for tasks assessed/performed Cognition: No apparent impairments                               Following commands: Intact       Cueing  General Comments   Cueing Techniques: Verbal cues  VSS   Exercises     Shoulder Instructions  Home Living Family/patient expects to be discharged to:: Private residence Living Arrangements: Alone Available Help at Discharge: Family;Available PRN/intermittently Type of Home: House Home Access: Stairs to enter [employer] of Steps: 3 Entrance Stairs-Rails: Can reach both       Bathroom Shower/Tub: Producer, television/film/video: Standard     Home Equipment: Agricultural consultant (2 wheels);Shower seat          Prior Functioning/Environment Prior Level of Function : Independent/Modified Independent                    OT Problem List: Decreased strength;Decreased range of motion;Decreased activity tolerance;Impaired balance (sitting and/or standing)   OT Treatment/Interventions: Self-care/ADL  training;Therapeutic exercise;Energy conservation;DME and/or AE instruction;Therapeutic activities;Patient/family education;Balance training      OT Goals(Current goals can be found in the care plan section)   Acute Rehab OT Goals Patient Stated Goal: none stated OT Goal Formulation: With patient Time For Goal Achievement: 09/12/23 Potential to Achieve Goals: Good   OT Frequency:  Min 2X/week    Co-evaluation              AM-PAC OT 6 Clicks Daily Activity     Outcome Measure Help from another person eating meals?: None Help from another person taking care of personal grooming?: None Help from another person toileting, which includes using toliet, bedpan, or urinal?: None Help from another person bathing (including washing, rinsing, drying)?: A Little Help from another person to put on and taking off regular upper body clothing?: None Help from another person to put on and taking off regular lower body clothing?: None 6 Click Score: 23   End of Session Nurse Communication: Mobility status  Activity Tolerance: Patient tolerated treatment well Patient left: in bed;with call bell/phone within reach  OT Visit Diagnosis: Muscle weakness (generalized) (M62.81)                Time: 9261-9190 OT Time Calculation (min): 31 min Charges:  OT General Charges $OT Visit: 1 Visit OT Evaluation $OT Eval Low Complexity: 1 Low OT Treatments $Self Care/Home Management : 8-22 mins  Rendy Lazard K, OTD, OTR/L [employer] Preferred Acute Rehab [phone]   Vannie Hilgert K Koonce 08/29/2023, 8:28 AM

## 2023-08-29 NOTE — Progress Notes (Signed)
 Patient alert and oriented, void, ambulate. Surgical clean and dry no sign of infection. D/c instructions explain and given to the patient and daughter. Patient d/c home per order.

## 2023-08-29 NOTE — Discharge Instructions (Signed)
 Wound Care Keep incision covered and dry until post op day 3. You may remove the Honeycomb dressing on post op day 3. Leave steri-strips.  They will fall off by themselves. Do not put any creams, lotions, or ointments on incision. You are fine to shower. Let water run over incision and pat dry.  Activity Walk each and every day, increasing distance each day. No lifting greater than 5 lbs.  Avoid excessive neck motion. No driving for 2 weeks; may ride as a passenger locally.  Diet Resume your normal diet.   Return to Work Will be discussed at your follow up appointment.  Call Your Doctor If Any of These Occur Redness, drainage, or swelling at the wound.  Temperature greater than 101 degrees. Severe pain not relieved by pain medication. Incision starts to come apart.  Follow Up Appt Call [phone] today for appointment in 2-3 weeks if you don't already have one or for any problems.

## 2023-08-31 ENCOUNTER — Encounter (HOSPITAL_COMMUNITY): Payer: Self-pay | Admitting: Neurological Surgery

## 2023-08-31 NOTE — Telephone Encounter (Signed)
 Pt called and LVM wanting to speak to nurse that sent mychart message.

## 2023-09-01 NOTE — Anesthesia Postprocedure Evaluation (Signed)
 Anesthesia Post Note  Patient: Helen Glover  Procedure(s) Performed: CERVICAL FOUR-FIVE POSTERIOR CERVICAL FUSION     Patient location during evaluation: PACU Anesthesia Type: General Level of consciousness: awake and alert Pain management: pain level controlled Vital Signs Assessment: post-procedure vital signs reviewed and stable Respiratory status: spontaneous breathing, nonlabored ventilation and respiratory function stable Cardiovascular status: blood pressure returned to baseline and stable Postop Assessment: no apparent nausea or vomiting Anesthetic complications: no   No notable events documented.  Last Vitals:  Vitals:   08/29/23 0438 08/29/23 0722  BP:  (!) 120/59  Pulse:  76  Resp:  20  Temp: 36.5 C 36.4 C  SpO2:  100%    Last Pain:  Vitals:   08/29/23 0924  TempSrc:   PainSc: 3                  Butler Levander Pinal

## 2023-09-02 ENCOUNTER — Telehealth: Payer: Self-pay

## 2023-09-02 LAB — AEROBIC/ANAEROBIC CULTURE W GRAM STAIN (SURGICAL/DEEP WOUND)
Culture: NO GROWTH
Gram Stain: NONE SEEN

## 2023-09-02 NOTE — Telephone Encounter (Signed)
 Called pt back and got her scheduled with Dr. Vear on 10/01/2023 @ 1:30pm to talk about Trigeminal Nerve.

## 2023-09-02 NOTE — Telephone Encounter (Signed)
 Got pt scheduled with Dr. Vear on 10/01/23 @ 1:30pm. Please See phone note

## 2023-09-15 ENCOUNTER — Ambulatory Visit (INDEPENDENT_AMBULATORY_CARE_PROVIDER_SITE_OTHER)

## 2023-09-15 VITALS — Ht 58.5 in | Wt 106.0 lb

## 2023-09-15 DIAGNOSIS — Z0000 Encounter for general adult medical examination without abnormal findings: Secondary | ICD-10-CM | POA: Diagnosis not present

## 2023-09-15 NOTE — Progress Notes (Signed)
 Subjective:   Helen Glover is a 73 y.o. who presents for a Medicare Wellness preventive visit.  As a reminder, Annual Wellness Visits don't include a physical exam, and some assessments may be limited, especially if this visit is performed virtually. We may recommend an in-person follow-up visit with your provider if needed.  Visit Complete: Virtual I connected with  Helen Glover on 09/15/23 by a audio enabled telemedicine application and verified that I am speaking with the correct person using two identifiers.  Patient Location: Home  Provider Location: Office/Clinic  I discussed the limitations of evaluation and management by telemedicine. The patient expressed understanding and agreed to proceed.  Vital Signs: Because this visit was a virtual/telehealth visit, some criteria may be missing or patient reported. Any vitals not documented were not able to be obtained and vitals that have been documented are patient reported.  VideoDeclined- This patient declined Librarian, academic. Therefore the visit was completed with audio only.  Persons Participating in Visit: Patient.  AWV Questionnaire: No: Patient Medicare AWV questionnaire was not completed prior to this visit.  Cardiac Risk Factors include: advanced age (>23men, >38 women);dyslipidemia;hypertension     Objective:    Today's Vitals   09/15/23 1453  Weight: 106 lb (48.1 kg)  Height: 4' 10.5 (1.486 m)  PainSc: 6   PainLoc: Shoulder   Body mass index is 21.78 kg/m.     08/28/2023    1:18 PM 08/25/2023    1:33 PM 05/05/2023   10:08 AM 08/28/2022   10:18 AM 02/07/2022    5:12 AM 12/17/2021   11:27 AM 07/11/2021   10:23 AM  Advanced Directives  Does Patient Have a Medical Advance Directive? No Yes No Yes No Yes Yes  Type of Furniture conservator/restorer;Living will  Living will;Healthcare Power of [employer] Power of [place];Living will Healthcare Power of  [place];Living will  Does patient want to make changes to medical advance directive?  No - Patient declined  No - Patient declined   No - Patient declined  Copy of Healthcare Power of Attorney in Chart?  No - copy requested  No - copy requested   No - copy requested  Would patient like information on creating a medical advance directive?   No - Patient declined        Current Medications (verified) Outpatient Encounter Medications as of 09/15/2023  Medication Sig   Ascorbic Acid  (VITAMIN C ) 100 MG tablet Take 1,000 mg by mouth daily.   Biotin  1 MG CAPS Take 1 mg by mouth daily.   BOOSTRIX 5-2.5-18.5 LF-MCG/0.5 injection    calcium  carbonate (OS-CAL - DOSED IN MG OF ELEMENTAL CALCIUM ) 1250 (500 Ca) MG tablet Take 2 tablets by mouth daily with breakfast.   cholecalciferol  (VITAMIN D ) 1000 UNITS tablet Take 1,000 Units by mouth daily.   Cholecalciferol  (VITAMIN D ) 50 MCG (2000 UT) CAPS Take 2,000 Units by mouth every evening.   estradiol  (VIVELLE -DOT) 0.025 MG/24HR Place 1 patch onto the skin 2 (two) times a week.   gabapentin  (NEURONTIN ) 100 MG capsule Take 200 mg by mouth at bedtime.   GEMTESA 75 MG TABS Take 75 mg by mouth daily.   hydrocortisone  (ANUSOL -HC) 2.5 % rectal cream Place 1 Application rectally 2 (two) times daily.   ipratropium (ATROVENT ) 0.06 % nasal spray Place 2 sprays into both nostrils 4 (four) times daily as needed for rhinitis.   L-Arginine  1000 MG TABS Take 1,000 mg by  mouth daily.   meloxicam  (MOBIC ) 15 MG tablet Take  1/2 to 1 tablet  Daily  with Food  for Pain & Inflammation   methocarbamol  (ROBAXIN ) 500 MG tablet Take 1 tablet (500 mg total) by mouth every 6 (six) hours as needed for muscle spasms.   omeprazole  (PRILOSEC) 20 MG capsule Take 1 capsule (20 mg total) by mouth daily.   oxyCODONE -acetaminophen  (PERCOCET) 5-325 MG tablet Take 1-2 tablets by mouth every 4 (four) hours as needed.   polyethylene glycol (MIRALAX ) 17 g packet Take 17 g by mouth daily.    Probiotic Product (PROBIOTIC PO) Take 1 tablet by mouth daily.   Pyridoxine HCl (B-6 PO) Take 1 tablet by mouth daily.   estradiol  (ESTRACE ) 0.1 MG/GM vaginal cream Place 1 Applicatorful vaginally once a week. (Patient not taking: Reported on 09/15/2023)   OXcarbazepine  (TRILEPTAL ) 150 MG tablet Take 1 tablet (150 mg total) by mouth 2 (two) times daily. (Patient not taking: Reported on 09/15/2023)   [DISCONTINUED] methylPREDNISolone  (MEDROL  DOSEPAK) 4 MG TBPK tablet Please take per packaging instructions. (Patient not taking: Reported on 08/19/2023)   No facility-administered encounter medications on file as of 09/15/2023.    Allergies (verified) Amoxicillin -pot clavulanate, Levofloxacin, Other, Codeine, and Sulfa antibiotics   History: Past Medical History:  Diagnosis Date   Allergy    Anxiety    Arthritis    in neck   Bursitis of right hip    Carpal tunnel syndrome, bilateral    rt wrist   Cataract    Cervical vertebral fusion    C1-2 posterior fusion   Complete rotator cuff tear of left shoulder    Depression    Difficult intubation 2015   pt states she was told she was a difficult intubation several years ago but has had several intubations since then with no issues   Eagle's syndrome    GERD (gastroesophageal reflux disease)    Hearing loss of both ears 12/25/2014   Has hearing aids/tubes    Heart murmur    as child   HNP (herniated nucleus pulposus)    Ingrown toenail of right foot 2023   Interstitial cystitis    Kidney cysts    followed by Dr. Cam   Osteopenia    Plantar fasciitis    Spinal stenosis    Tingling    rt foot   Wears hearing aid    Past Surgical History:  Procedure Laterality Date   ABDOMINAL HYSTERECTOMY  2003   BSO-fibroids   ANAL SPHINCTEROTOMY     CARPAL TUNNEL RELEASE     LEFT   CARPAL TUNNEL RELEASE Right 2025   and trigger finger   CERVICAL FUSION  02/25/2018   c1-c2 with laminectomy   CESAREAN SECTION     CYSTOSCOPY      HEMORRHOID SURGERY N/A 1998   KNEE ARTHROSCOPY Right 06/22/2019   Dr. Kay    LUMBAR LAMINECTOMY     LUMBAR LAMINECTOMY/DECOMPRESSION MICRODISCECTOMY N/A 08/09/2014   Procedure: MICRO LUMBAR DECOMPRESSION L3-4,  REDO DECOMPRESSION, MICRODISCECTOMY L4-5, FORAMINOTOMY L3-L4, L4-L5 BILATERAL;  Surgeon: Reyes Billing, MD;  Location: [facility];  Service: Orthopedics;  Laterality: N/A;   MYRINGOTOMY     NASAL SINUS SURGERY     POSTERIOR CERVICAL FUSION/FORAMINOTOMY N/A 08/28/2023   Procedure: CERVICAL FOUR-FIVE POSTERIOR CERVICAL FUSION;  Surgeon: Joshua Alm Hamilton, MD;  Location: [facility] OR;  Service: Neurosurgery;  Laterality: N/A;  Posterior cervical fusion with lateral mass fixation - C4-C5, laminectomy C4-5   SPINE SURGERY  TONSILLECTOMY     LEFT only, due to Eagle Syndrome   TONSILLECTOMY  11/2012   right side due to Eagles syndrome   TRIGGER FINGER RELEASE     RIGHT   TYMPANOSTOMY TUBE PLACEMENT     Family History  Problem Relation Age of Onset   Diabetes Mother    Hypertension Mother    Hyperlipidemia Mother    Osteoporosis Mother    Arthritis Mother    Hearing loss Mother    Kidney disease Mother    Hypertension Brother    Hyperlipidemia Brother    Prostate cancer Father        Bone   Cancer Father    Alcohol abuse Paternal Uncle    Alcohol abuse Paternal Uncle    Social History   Socioeconomic History   Marital status: Divorced    Spouse name: Not on file   Number of children: 2   Years of education: Not on file   Highest education level: Master's degree (e.g., MA, MS, MEng, MEd, MSW, MBA)  Occupational History   Not on file  Tobacco Use   Smoking status: Former    Current packs/day: 0.00    Average packs/day: 0.5 packs/day for 15.0 years (7.5 ttl pk-yrs)    Types: Cigarettes    Start date: 12/04/1971    Quit date: 12/04/1986    Years since quitting: 36.8   Smokeless tobacco: Never  Vaping Use   Vaping status: Never Used  Substance and Sexual Activity    Alcohol use: Not Currently    Alcohol/week: 2.0 standard drinks of alcohol    Types: 2 Glasses of wine per week    Comment: once/wk-1-2   Drug use: No   Sexual activity: Not Currently    Partners: Male    Birth control/protection: Surgical    Comment: TAH/BSO  Other Topics Concern   Not on file  Social History Narrative   Right Handed   1 Cup of Hot Tea per Day   1 Coffee per Day      3 grands   Retired Runner, broadcasting/film/video   Social Drivers of Corporate investment banker Strain: Low Risk  (09/15/2023)   Overall Financial Resource Strain (CARDIA)    Difficulty of Paying Living Expenses: Not hard at all  Food Insecurity: No Food Insecurity (09/15/2023)   Hunger Vital Sign    Worried About Running Out of Food in the Last Year: Never true    Ran Out of Food in the Last Year: Never true  Transportation Needs: No Transportation Needs (09/15/2023)   PRAPARE - Administrator, Civil Service (Medical): No    Lack of Transportation (Non-Medical): No  Physical Activity: Inactive (09/15/2023)   Exercise Vital Sign    Days of Exercise per Week: 0 days    Minutes of Exercise per Session: 0 min  Stress: No Stress Concern Present (09/15/2023)   [employer] of Occupational Health - Occupational Stress Questionnaire    Feeling of Stress: Only a little  Recent Concern: Stress - Stress Concern Present (07/17/2023)   [employer] of Occupational Health - Occupational Stress Questionnaire    Feeling of Stress : To some extent  Social Connections: Moderately Isolated (09/15/2023)   Social Connection and Isolation Panel    Frequency of Communication with Friends and Family: More than three times a week    Frequency of Social Gatherings with Friends and Family: Three times a week    Attends Religious Services: Never  Active Member of Clubs or Organizations: Yes    Attends Banker Meetings: 1 to 4 times per year    Marital Status: Divorced    Tobacco Counseling Counseling  given: Not Answered    Clinical Intake:  Pre-visit preparation completed: Yes  Pain : 0-10 Pain Score: 6  Pain Type: Chronic pain Pain Location: Shoulder Pain Orientation: Left, Right Pain Descriptors / Indicators: Aching Pain Onset: 1 to 4 weeks ago Pain Frequency: Intermittent     BMI - recorded: 21.78 Diabetes: No  Lab Results  Component Value Date   HGBA1C 5.8 07/20/2023   HGBA1C 5.5 12/04/2022   HGBA1C 5.4 11/21/2021     How often do you need to have someone help you when you read instructions, pamphlets, or other written materials from your doctor or pharmacy?: 1 - Never  Interpreter Needed?: No  Information entered by :: Ellouise Haws, LPN   Activities of Daily Living     09/15/2023    2:57 PM 08/25/2023    1:37 PM  In your present state of health, do you have any difficulty performing the following activities:  Hearing? 1   Vision? 0   Difficulty concentrating or making decisions? 0   Walking or climbing stairs? 0   Dressing or bathing? 0   Doing errands, shopping? 0 0  Preparing Food and eating ? N   Using the Toilet? N   In the past six months, have you accidently leaked urine? N   Do you have problems with loss of bowel control? N   Managing your Medications? N   Managing your Finances? N   Housekeeping or managing your Housekeeping? N     Patient Care Team: Wendolyn Jenkins Jansky, MD as PCP - General (Family Medicine) Cleotilde Ronal RAMAN, MD as PCP - OBGYN (Obstetrics and Gynecology) Cleotilde Carlin Lenis, OD as Referring Physician (Optometry) Addie, Cordella Hamilton, MD as Consulting Physician (Orthopedic Surgery) Yvone Katz, MD as Referring Physician (Orthopedic Surgery) Cleotilde Ronal RAMAN, MD as Consulting Physician (Gynecology) Cam Katz ORN, MD as Attending Physician (Urology) Karis Clunes, MD as Consulting Physician (Otolaryngology) Burnette Fallow, MD as Consulting Physician (Gastroenterology) Quinn Odor, [facility] (Inactive) as Pharmacist  (Pharmacist)  I have updated your Care Teams any recent Medical Services you may have received from other providers in the past year.     Assessment:   This is a routine wellness examination for [place].  Hearing/Vision screen Hearing Screening - Comments:: Hearing aids  Vision Screening - Comments:: Wears rx glasses - up to date with routine eye exams with Dr Adine Glover    Goals Addressed             This Visit's Progress    Patient Stated       Get back to better health and walking more        Depression Screen     09/15/2023    2:59 PM 07/20/2023    8:19 AM 01/09/2023   11:10 AM 08/28/2022   10:18 AM 04/10/2022   10:07 AM 12/19/2021    2:13 PM 08/12/2021    3:26 PM  PHQ 2/9 Scores  PHQ - 2 Score 1 0 0 0 0 0 0    Fall Risk     09/15/2023    3:06 PM 07/20/2023    8:28 AM 01/09/2023   11:10 AM 08/28/2022   10:18 AM 07/11/2021   10:25 AM  Fall Risk   Falls in the past year? 1 1 0  0 0  Number falls in past yr: 1 1 0 0 0  Injury with Fall? 0 0 0 0 0  Risk for fall due to : History of fall(s) Impaired balance/gait  No Fall Risks Orthopedic patient  Follow up Falls prevention discussed Falls prevention discussed  Falls evaluation completed;Falls prevention discussed Falls evaluation completed;Follow up appointment      Data saved with a previous flowsheet row definition    MEDICARE RISK AT HOME:  Medicare Risk at Home Any stairs in or around the home?: Yes If so, are there any without handrails?: No Home free of loose throw rugs in walkways, pet beds, electrical cords, etc?: Yes Adequate lighting in your home to reduce risk of falls?: Yes Life alert?: No Use of a cane, walker or w/c?: No Grab bars in the bathroom?: Yes Shower chair or bench in shower?: No Elevated toilet seat or a handicapped toilet?: No  TIMED UP AND GO:  Was the test performed?  No  Cognitive Function: 6CIT completed        09/15/2023    3:08 PM 06/18/2018    4:18 PM  6CIT Screen  What  Year? 0 points 0 points  What month? 0 points 0 points  What time? 0 points 0 points  Count back from 20 0 points 0 points  Months in reverse 0 points 0 points  Repeat phrase 0 points 0 points  Total Score 0 points 0 points    Immunizations Immunization History  Administered Date(s) Administered   DTaP 10/04/2010   Fluad Quad(high Dose 65+) 10/26/2018, 12/04/2019, 10/23/2021   Influenza, High Dose Seasonal PF 11/24/2016, 12/04/2019, 11/11/2020, 10/28/2021, 10/23/2022   Influenza,inj,quad, With Preservative 11/17/2016, 10/19/2018   Influenza-Unspecified 11/29/2012, 11/29/2013, 11/17/2014, 11/27/2015, 11/24/2016, 10/04/2017, 10/23/2021   Moderna Covid-19 Vaccine Bivalent Booster 3yrs & up 06/08/2021, 04/25/2022   Moderna SARS-COV2 Booster Vaccination 11/05/2021   Moderna Sars-Covid-2 Vaccination 10/30/2020   PFIZER(Purple Top)SARS-COV-2 Vaccination 03/08/2019, 03/26/2019, 11/14/2019, 05/17/2020, 10/23/2022   Pfizer Covid-19 Vaccine Bivalent Booster 78yrs & up 10/27/2022   Pfizer(Comirnaty)Fall Seasonal Vaccine 12 years and older 10/23/2022, 06/06/2023   Pneumococcal Conjugate-13 12/21/2013   Pneumococcal Polysaccharide-23 12/25/2016   Pneumococcal-Unspecified 12/04/2002   Respiratory Syncytial Virus Vaccine,Recomb Aduvanted(Arexvy) 12/03/2021   Tdap 07/20/2023   Zoster Recombinant(Shingrix) 05/22/2016, 09/17/2016, 10/16/2016   Zoster, Live 12/03/2005    Screening Tests Health Maintenance  Topic Date Due   Colonoscopy  07/14/2023   INFLUENZA VACCINE  09/18/2023   MAMMOGRAM  09/22/2023   COVID-19 Vaccine (10 - Pfizer risk 2024-25 season) 12/06/2023   Medicare Annual Wellness (AWV)  09/14/2024   Pneumococcal Vaccine: 50+ Years  Completed   DEXA SCAN  Completed   Hepatitis C Screening  Completed   Zoster Vaccines- Shingrix  Completed   Hepatitis B Vaccines  Aged Out   HPV VACCINES  Aged Out   Meningococcal B Vaccine  Aged Out   DTaP/Tdap/Td  Discontinued    Health  Maintenance  Health Maintenance Due  Topic Date Due   Colonoscopy  07/14/2023   Health Maintenance Items Addressed: See Nurse Notes at the end of this note  Additional Screening:  Vision Screening: Recommended annual ophthalmology exams for early detection of glaucoma and other disorders of the eye. Would you like a referral to an eye doctor? No    Dental Screening: Recommended annual dental exams for proper oral hygiene  Community Resource Referral / Chronic Care Management: CRR required this visit?  No   CCM required this visit?  No  Plan:    I have personally reviewed and noted the following in the patient's chart:   Medical and social history Use of alcohol, tobacco or illicit drugs  Current medications and supplements including opioid prescriptions. Patient is currently taking opioid prescriptions. Information provided to patient regarding non-opioid alternatives. Patient advised to discuss non-opioid treatment plan with their provider. Functional ability and status Nutritional status Physical activity Advanced directives List of other physicians Hospitalizations, surgeries, and ER visits in previous 12 months Vitals Screenings to include cognitive, depression, and falls Referrals and appointments  In addition, I have reviewed and discussed with patient certain preventive protocols, quality metrics, and best practice recommendations. A written personalized care plan for preventive services as well as general preventive health recommendations were provided to patient.   Ellouise VEAR Haws, LPN   [id number]   After Visit Summary: (MyChart) Due to this being a telephonic visit, the after visit summary with patients personalized plan was offered to patient via MyChart   Notes: Nothing significant to report at this time. Pt wants to discuss with gastrologist Dr Burnette concerning colonoscopy

## 2023-09-15 NOTE — Patient Instructions (Signed)
 Helen Glover , Thank you for taking time out of your busy schedule to complete your Annual Wellness Visit with me. I enjoyed our conversation and look forward to speaking with you again next year. I, as well as your care team,  appreciate your ongoing commitment to your health goals. Please review the following plan we discussed and let me know if I can assist you in the future. Your Game plan/ To Do List    Referrals: If you haven't heard from the office you've been referred to, please reach out to them at the phone provided.   Follow up Visits: Next Medicare AWV with our clinical staff: 09/20/24   Have you seen your provider in the last 6 months (3 months if uncontrolled diabetes)? Yes Next Office Visit with your provider: 02/22/24  Clinician Recommendations:  Each day, aim for 6 glasses of water, plenty of protein in your diet and try to get up and walk/ stretch every hour for 5-10 minutes at a time.        This is a list of the screening recommended for you and due dates:  Health Maintenance  Topic Date Due   Colon Cancer Screening  07/14/2023   Flu Shot  09/18/2023   Mammogram  09/22/2023   COVID-19 Vaccine (10 - Pfizer risk 2024-25 season) 12/06/2023   Medicare Annual Wellness Visit  09/14/2024   Pneumococcal Vaccine for age over 64  Completed   DEXA scan (bone density measurement)  Completed   Hepatitis C Screening  Completed   Zoster (Shingles) Vaccine  Completed   Hepatitis B Vaccine  Aged Out   HPV Vaccine  Aged Out   Meningitis B Vaccine  Aged Out   DTaP/Tdap/Td vaccine  Discontinued    Advanced directives: (Copy Requested) Please bring a copy of your health care power of attorney and living will to the office to be added to your chart at your convenience. You can mail to [facility] [address] [place], [place] [address] or email to [email or web address] Advance Care Planning is important because it:  [x]  Makes sure you receive the medical care that  is consistent with your values, goals, and preferences  [x]  It provides guidance to your family and loved ones and reduces their decisional burden about whether or not they are making the right decisions based on your wishes.  Follow the link provided in your after visit summary or read over the paperwork we have mailed to you to help you started getting your Advance Directives in place. If you need assistance in completing these, please reach out to us  so that we can help you!  See attachments for Preventive Care and Fall Prevention Tips.

## 2023-09-21 ENCOUNTER — Ambulatory Visit: Payer: Self-pay

## 2023-09-21 NOTE — Telephone Encounter (Signed)
 FYI Only or Action Required?: FYI only for provider.  Patient was last seen in primary care on 07/28/2023 by Wendolyn Jenkins Jansky, MD.  Called Nurse Triage reporting Pain with swallowing food.  Symptoms began several days ago.  Interventions attempted: Rest, hydration, or home remedies.  Symptoms are: unchanged.  Triage Disposition: See PCP When Office is Open (Within 3 Days)-patient requested 09/25/2023 appointment due to her availability.   Patient/caregiver understands and will follow disposition?: Yes  Copied from CRM [id number]. Topic: Clinical - Red Word Triage >> Sep 21, 2023 11:18 AM Chiquita SQUIBB wrote: Red Word that prompted transfer to Nurse Triage: Patient is having trouble swallowing food and a pain with swallowing food. Patient states it is mostly with food and sometimes with drinks. Reason for Disposition  [1] Sore throat is the only symptom AND [2] present > 48 hours  Answer Assessment - Initial Assessment Questions 1. ONSET: When did the throat start hurting? (Hours or days ago)      Five days ago 2. SEVERITY: How bad is the sore throat? (Scale 1-10; mild, moderate or severe)     3  3. STREP EXPOSURE: Has there been any exposure to strep within the past week? If Yes, ask: What type of contact occurred?      no 4.  VIRAL SYMPTOMS: Are there any symptoms of a cold, such as a runny nose, cough, hoarse voice or red eyes?      no 5. FEVER: Do you have a fever? If Yes, ask: What is your temperature, how was it measured, and when did it start?     no 7. OTHER SYMPTOMS: Do you have any other symptoms? (e.g., difficulty breathing, headache, rash)     No  Patient had surgery to C4 and C5 on July 11. Patient requested an appointment on August 8th due to availability for herself. Patient given instructions to call back if symptoms worsen.  Protocols used: Sore Throat-A-AH

## 2023-09-21 NOTE — Telephone Encounter (Signed)
 FYI

## 2023-09-23 DIAGNOSIS — M65342 Trigger finger, left ring finger: Secondary | ICD-10-CM | POA: Diagnosis not present

## 2023-09-23 DIAGNOSIS — M65331 Trigger finger, right middle finger: Secondary | ICD-10-CM | POA: Diagnosis not present

## 2023-09-23 DIAGNOSIS — G5601 Carpal tunnel syndrome, right upper limb: Secondary | ICD-10-CM | POA: Diagnosis not present

## 2023-09-23 DIAGNOSIS — M65321 Trigger finger, right index finger: Secondary | ICD-10-CM | POA: Insufficient documentation

## 2023-09-23 DIAGNOSIS — M25532 Pain in left wrist: Secondary | ICD-10-CM | POA: Diagnosis not present

## 2023-09-23 DIAGNOSIS — Z4789 Encounter for other orthopedic aftercare: Secondary | ICD-10-CM | POA: Diagnosis not present

## 2023-09-25 ENCOUNTER — Encounter: Payer: Self-pay | Admitting: Family

## 2023-09-25 ENCOUNTER — Ambulatory Visit (INDEPENDENT_AMBULATORY_CARE_PROVIDER_SITE_OTHER): Admitting: Family

## 2023-09-25 VITALS — BP 122/73 | HR 59 | Temp 97.5°F | Ht 58.5 in | Wt 105.0 lb

## 2023-09-25 DIAGNOSIS — M2041 Other hammer toe(s) (acquired), right foot: Secondary | ICD-10-CM | POA: Insufficient documentation

## 2023-09-25 DIAGNOSIS — R1013 Epigastric pain: Secondary | ICD-10-CM

## 2023-09-25 DIAGNOSIS — Z981 Arthrodesis status: Secondary | ICD-10-CM | POA: Diagnosis not present

## 2023-09-25 DIAGNOSIS — K219 Gastro-esophageal reflux disease without esophagitis: Secondary | ICD-10-CM | POA: Diagnosis not present

## 2023-09-25 NOTE — Progress Notes (Signed)
 Patient ID: Helen Glover, female    DOB: [birth year], 73 y.o.   MRN: [id number]  Chief Complaint  Patient presents with   Throat pain    Pt c/o pain in upper stomach after swallowing, present for 1 week.   Discussed the use of AI scribe software for clinical note transcription with the patient, who gave verbal consent to proceed.  History of Present Illness Helen Glover is a 73 year old female who presents with difficulty swallowing and left-sided neck pain following a posterior cervical fusion four weeks ago.  Epigastric pain - Pain after swallowing, especially with solid foods, for 1 week - Painful sensation after swallowing that resolves quickly - No heartburn or reflux or nausea - Pain does not radiate to the back - Takes omeprazole  20 mg daily, started post-surgery  Neck pain - Left-sided neck pain following posterior cervical fusion 3.5 weeks ago  Postoperative musculoskeletal symptoms - Post-surgery deltoid pain led to MRI and subsequent surgery to remove a possible tumor - Infection was ruled out - Discontinued oxycodone , suspecting it contributed to symptoms - Continues meloxicam , a muscle relaxer, and gabapentin  (currently attempting to reduce gabapentin )  Gastrointestinal symptoms and bowel habits - Increased gas, more severe four days ago - History of irritable bowel syndrome with alternating bowel habits - Stopped Colace after discontinuing oxycodone   Dietary modifications - Low-acid diet due to interstitial cystitis - Avoids caffeine and acidic foods, except for tea and an espresso drink with Preleaf  Assessment & Plan Epigastric pain Post-surgery, primarily with food. No reflux or hx of hiatal hernia. Possibly stress-related d/t recent surgery and other procedures. - Increase omeprazole  to twice daily for 1-2 weeks, then reduce to daily. - Continue to reduce acid in diet including caffeine, chocolate, etc. - Call office if sx are not  improved  Postoperative pain following cervical spine fusion Persistent left-sided top of shoulder pain post-surgery. Managed with gabapentin  and muscle relaxers. Evaluating meloxicam 's acidity contribution. - Continue gabapentin  and muscle relaxers. - Ok to continue meloxicam  for now.  Gastroesophageal reflux disease GERD managed with omeprazole . Recent symptom increase possibly due to stress and medication changes. Meloxicam  may contribute to acidity. - Increase omeprazole  to twice daily for 1-2 weeks, then reduce back to 1 pill daily - Continue to avoid acidic foods including caffeine & chocolate. - Consider reducing meloxicam  once pain is better managed.  Hard corn of foot Hard corn present for five years. Dermatologist advised against podiatrist intervention. - Try over-the-counter corn pads with medication. - Consider podiatrist referral if corn persists.  Subjective:    Outpatient Medications Prior to Visit  Medication Sig Dispense Refill   Ascorbic Acid  (VITAMIN C ) 100 MG tablet Take 1,000 mg by mouth daily.     Biotin  1 MG CAPS Take 1 mg by mouth daily.     BOOSTRIX 5-2.5-18.5 LF-MCG/0.5 injection      calcium  carbonate (OS-CAL - DOSED IN MG OF ELEMENTAL CALCIUM ) 1250 (500 Ca) MG tablet Take 2 tablets by mouth daily with breakfast.     cholecalciferol  (VITAMIN D ) 1000 UNITS tablet Take 1,000 Units by mouth daily.     Cholecalciferol  (VITAMIN D ) 50 MCG (2000 UT) CAPS Take 2,000 Units by mouth every evening.     estradiol  (ESTRACE ) 0.1 MG/GM vaginal cream Place 1 Applicatorful vaginally once a week.     estradiol  (VIVELLE -DOT) 0.025 MG/24HR Place 1 patch onto the skin 2 (two) times a week. 24 patch 4   gabapentin  (NEURONTIN ) 100 MG  capsule Take 200 mg by mouth at bedtime.     GEMTESA 75 MG TABS Take 75 mg by mouth daily.     hydrocortisone  (ANUSOL -HC) 2.5 % rectal cream Place 1 Application rectally 2 (two) times daily. 30 g 0   ipratropium (ATROVENT ) 0.06 % nasal spray Place 2  sprays into both nostrils 4 (four) times daily as needed for rhinitis. 15 mL 3   L-Arginine  1000 MG TABS Take 1,000 mg by mouth daily.     meloxicam  (MOBIC ) 15 MG tablet Take  1/2 to 1 tablet  Daily  with Food  for Pain & Inflammation 90 tablet 3   methocarbamol  (ROBAXIN ) 500 MG tablet Take 1 tablet (500 mg total) by mouth every 6 (six) hours as needed for muscle spasms. 30 tablet 0   omeprazole  (PRILOSEC) 20 MG capsule Take 1 capsule (20 mg total) by mouth daily. 90 capsule 3   polyethylene glycol (MIRALAX ) 17 g packet Take 17 g by mouth daily. 14 each 0   Probiotic Product (PROBIOTIC PO) Take 1 tablet by mouth daily.     Pyridoxine HCl (B-6 PO) Take 1 tablet by mouth daily.     OXcarbazepine  (TRILEPTAL ) 150 MG tablet Take 1 tablet (150 mg total) by mouth 2 (two) times daily. (Patient not taking: Reported on 09/25/2023) 60 tablet 5   oxyCODONE -acetaminophen  (PERCOCET) 5-325 MG tablet Take 1-2 tablets by mouth every 4 (four) hours as needed. (Patient not taking: Reported on 09/25/2023) 30 tablet 0   No facility-administered medications prior to visit.   Past Medical History:  Diagnosis Date   Acquired hammer toe of right foot 09/25/2023   Acquired trigger finger of right index finger 09/23/2023   Allergy    Anxiety    Arthritis    in neck   Bursitis of right hip    Carpal tunnel syndrome, bilateral    rt wrist   Cataract    Cervical vertebral fusion    C1-2 posterior fusion   Complete rotator cuff tear of left shoulder    Depression    Difficult intubation 2015   pt states she was told she was a difficult intubation several years ago but has had several intubations since then with no issues   Eagle's syndrome    GERD (gastroesophageal reflux disease)    Hearing loss of both ears 12/25/2014   Has hearing aids/tubes    Heart murmur    as child   HNP (herniated nucleus pulposus)    Ingrown toenail of right foot 2023   Interstitial cystitis    Kidney cysts    followed by Dr.  Cam   Osteopenia    Plantar fasciitis    Spinal stenosis    Tingling    rt foot   Wears hearing aid    Past Surgical History:  Procedure Laterality Date   ABDOMINAL HYSTERECTOMY  2003   BSO-fibroids   ANAL SPHINCTEROTOMY     CARPAL TUNNEL RELEASE     LEFT   CARPAL TUNNEL RELEASE Right 2025   and trigger finger   CERVICAL FUSION  02/25/2018   c1-c2 with laminectomy   CESAREAN SECTION     CYSTOSCOPY     HEMORRHOID SURGERY N/A 1998   KNEE ARTHROSCOPY Right 06/22/2019   Dr. Kay    LUMBAR LAMINECTOMY     LUMBAR LAMINECTOMY/DECOMPRESSION MICRODISCECTOMY N/A 08/09/2014   Procedure: MICRO LUMBAR DECOMPRESSION L3-4,  REDO DECOMPRESSION, MICRODISCECTOMY L4-5, FORAMINOTOMY L3-L4, L4-L5 BILATERAL;  Surgeon: Reyes Billing, MD;  Location: [facility];  Service: Orthopedics;  Laterality: N/A;   MYRINGOTOMY     NASAL SINUS SURGERY     POSTERIOR CERVICAL FUSION/FORAMINOTOMY N/A 08/28/2023   Procedure: CERVICAL FOUR-FIVE POSTERIOR CERVICAL FUSION;  Surgeon: Joshua Alm Hamilton, MD;  Location: [facility];  Service: Neurosurgery;  Laterality: N/A;  Posterior cervical fusion with lateral mass fixation - C4-C5, laminectomy C4-5   SPINE SURGERY     TONSILLECTOMY     LEFT only, due to Eagle Syndrome   TONSILLECTOMY  11/2012   right side due to Eagles syndrome   TRIGGER FINGER RELEASE     RIGHT   TYMPANOSTOMY TUBE PLACEMENT     Allergies  Allergen Reactions   Amoxicillin -Pot Clavulanate Other (See Comments)   Levofloxacin Diarrhea   Other     Band-aid   Codeine Hives   Sulfa Antibiotics Nausea And Vomiting      Objective:    Physical Exam Vitals and nursing note reviewed.  Constitutional:      Appearance: Normal appearance.  Cardiovascular:     Rate and Rhythm: Normal rate and regular rhythm.  Pulmonary:     Effort: Pulmonary effort is normal.     Breath sounds: Normal breath sounds.  Abdominal:     General: Abdomen is flat. There is no distension.     Tenderness: There is no  abdominal tenderness.  Musculoskeletal:     Cervical back: Pain with movement (post surgery) present. Decreased range of motion (post surgery).  Feet:     Right foot:     Skin integrity: Callus (corn on 2nd lateral toe) present.  Skin:    General: Skin is warm and dry.  Neurological:     Mental Status: She is alert.  Psychiatric:        Mood and Affect: Mood normal.        Behavior: Behavior normal.    BP 122/73 (BP Location: Left Arm, Patient Position: Sitting, Cuff Size: Normal)   Pulse (!) 59   Temp (!) 97.5 F (36.4 C) (Temporal)   Ht 4' 10.5 (1.486 m)   Wt 105 lb (47.6 kg)   [name] 10/18/2001   SpO2 99%   BMI 21.57 kg/m  Wt Readings from Last 3 Encounters:  09/25/23 105 lb (47.6 kg)  09/15/23 106 lb (48.1 kg)  08/28/23 106 lb (48.1 kg)      Lucius Krabbe, NP

## 2023-09-28 DIAGNOSIS — Z1231 Encounter for screening mammogram for malignant neoplasm of breast: Secondary | ICD-10-CM | POA: Diagnosis not present

## 2023-10-01 ENCOUNTER — Encounter: Payer: Self-pay | Admitting: Neurology

## 2023-10-01 ENCOUNTER — Ambulatory Visit (INDEPENDENT_AMBULATORY_CARE_PROVIDER_SITE_OTHER): Admitting: Neurology

## 2023-10-01 VITALS — BP 129/77 | HR 66 | Ht 58.5 in | Wt 107.0 lb

## 2023-10-01 DIAGNOSIS — M4802 Spinal stenosis, cervical region: Secondary | ICD-10-CM

## 2023-10-01 DIAGNOSIS — G501 Atypical facial pain: Secondary | ICD-10-CM

## 2023-10-01 DIAGNOSIS — R200 Anesthesia of skin: Secondary | ICD-10-CM | POA: Diagnosis not present

## 2023-10-01 DIAGNOSIS — Z981 Arthrodesis status: Secondary | ICD-10-CM | POA: Diagnosis not present

## 2023-10-01 MED ORDER — OXCARBAZEPINE 150 MG PO TABS: 150.0000 mg | ORAL_TABLET | Freq: Two times a day (BID) | ORAL | 11 refills | Status: DC

## 2023-10-01 NOTE — Progress Notes (Signed)
 [facility]  PATIENT: Helen Glover DOB: 1950-06-15  REFERRING DOCTOR OR PCP: Elsie Richards, MD SOURCE: Patient, notes from primary care, imaging and lab reports, MRI images personally reviewed.  _________________________________   HISTORICAL  CHIEF COMPLAINT:  Chief Complaint  Patient presents with   Follow-up    Pt in room 11. Alone,Here for trigeminal neuralgia. Patient report concerns about facial symptoms, right side cheek, numbness on chin on left side, lip as well.     HISTORY OF PRESENT ILLNESS:  Helen Glover is a 73 y.o. woman with numbness and burning.  Update 10/01/2023 She had surgery by Dr. Joshua (C4C5 posterior fusion).   MRI 08/13/2023 for  left upper arm pain, shoulder pain showe spinal stenosis and C5 nerve root compression.   MRI showed unusual appearance of spinal stenosis and I had been concerned about infection though no evidence of infection at surgery.    She has had a good recovery.      She has  trigeminal neuralgia diagnosed by her dentist.   Pain is atypical with chronic dysesthesias, mostly left V3 and right V2.   She has had an implant right upper. She was on 100 mg gabapentin  at night (helped sleep)    Higher dose made her feel groggy.     She is also reporting chronic right sciatica and also had left sciatica, helped by an injection.   She will be having a NCV/EMG a [facility].      History of pain/numbness She reported increased pain the right foot since 2022, felt to be related to surgery at L4L5 (Dr. Duwayne) and L5S1 (Dr. Joshua).      The L4L5 decompression was first and she had had left > right leg pain leading to 2 operations at that level.   She had riht sciatica that was very painful in 2020 and had L5S1 decompression.  She felt weaker initially after the second operation but improved over time.     She had C1-C2 decompression in 2020 (Dr. Joshua) due to spinal stenosis      Since October 2023 she has had more right leg  tingling.  Since last month, she has also had more tingling in the left leg - not noted until she started to wear compression socks.    She notes mild right leg weakness.    She notes some numbness and tingling in her hands.   She has CTS and had surgery on the left in the past.   She wears a brace on the right.    She has itching sensation without redness on shoulders (had right before C1-C2 stenosis discovered)    She has some urinary urgency but has been diagnosed with IC.  She has no incontinence.  She has 1 x nocturia.      She used to drink heavily a couple times a week but no longer does so.     DATA IMAGING MRI of the lumbar spine 06/12/2020 showed Mild spinal stenosis at L1-L2 and L2-L3 and mild to moderate spinal stenosis at L3-L4 and L4-L5.  There is severe bilateral foraminal narrowing at L4-L5.  There has been prior decompression at L4-L5 and L5-S1.  There is enhancing scar posteriorly and in the right lateral recess at L4-L5 and posteriorly and at the lateral recesses at L5-S1  MRI of the cervical spine 10/13/2017 showed spinal stenosis at C2 due to degenerative pannus.  There is mild degenerative changes at C3-C4 through C6-C7 but no significant  spinal stenosis or nerve root compression.  MRI of the cervical 08/13/2023 showed At C4-C5, there is severe spinal stenosis due to a peripherally enhancing epidural mass.  This is concerning for an epidural abscess.  There is subtle T2 hyperintense signal within the spinal cord at this level above consistent with compression   At C1-C2, there is sequela of decompression surgery with artifact from pedicle screws noted.  The spinal stenosis seen in 2019 has resolved.    Degenerative changes at C3-C4, C5-C6 and C6-C7 as detailed above.  There is borderline spinal stenosis at C3-C4 and mild spinal stenosis at C5-C6 that has progressed compared to the 2019 MRI.  Labs: B12 ok, thiamine  is normal in 2023,   In 2025:    SSA/SSB, RA, SPEP/IEF, B1 and B12.   These were normal or unremarkable.   She had a NCV/EMG with Dr. Letha in 2022 (legs) but results are not available.  REVIEW OF SYSTEMS: Constitutional: No fevers, chills, sweats, or change in appetite Eyes: No visual changes, double vision, eye pain Ear, nose and throat: No ear pain, nasal congestion, sore throat.  Note some hearing loss Cardiovascular: No chest pain, palpitations Respiratory:  No shortness of breath at rest or with exertion.   No wheezes GastrointestinaI: No nausea, vomiting, diarrhea, abdominal pain, fecal incontinence Genitourinary:  No dysuria, urinary retention or frequency.  No nocturia. Musculoskeletal: Spine issues as above  integumentary: No rash, pruritus, skin lesions Neurological: as above Psychiatric: No depression at this time.  No anxiety Endocrine: No palpitations, diaphoresis, change in appetite, change in weigh or increased thirst Hematologic/Lymphatic:  No anemia, purpura, petechiae. Allergic/Immunologic: No itchy/runny eyes, nasal congestion, recent allergic reactions, rashes  ALLERGIES: Allergies  Allergen Reactions   Amoxicillin -Pot Clavulanate Other (See Comments)   Levofloxacin Diarrhea   Other     Band-aid   Codeine Hives   Sulfa Antibiotics Nausea And Vomiting    HOME MEDICATIONS:  Current Outpatient Medications:    Ascorbic Acid  (VITAMIN C ) 100 MG tablet, Take 1,000 mg by mouth daily., Disp: , Rfl:    Biotin  1 MG CAPS, Take 1 mg by mouth daily., Disp: , Rfl:    BOOSTRIX 5-2.5-18.5 LF-MCG/0.5 injection, , Disp: , Rfl:    calcium  carbonate (OS-CAL - DOSED IN MG OF ELEMENTAL CALCIUM ) 1250 (500 Ca) MG tablet, Take 2 tablets by mouth daily with breakfast., Disp: , Rfl:    cholecalciferol  (VITAMIN D ) 1000 UNITS tablet, Take 1,000 Units by mouth daily., Disp: , Rfl:    Cholecalciferol  (VITAMIN D ) 50 MCG (2000 UT) CAPS, Take 2,000 Units by mouth every evening., Disp: , Rfl:    estradiol  (ESTRACE ) 0.1 MG/GM vaginal cream, Place 1  Applicatorful vaginally once a week. (Patient taking differently: Place 1 Applicatorful vaginally once a week. Rare use), Disp: , Rfl:    estradiol  (VIVELLE -DOT) 0.025 MG/24HR, Place 1 patch onto the skin 2 (two) times a week., Disp: 24 patch, Rfl: 4   gabapentin  (NEURONTIN ) 100 MG capsule, Take 200 mg by mouth at bedtime., Disp: , Rfl:    GEMTESA 75 MG TABS, Take 75 mg by mouth daily., Disp: , Rfl:    hydrocortisone  (ANUSOL -HC) 2.5 % rectal cream, Place 1 Application rectally 2 (two) times daily., Disp: 30 g, Rfl: 0   ipratropium (ATROVENT ) 0.06 % nasal spray, Place 2 sprays into both nostrils 4 (four) times daily as needed for rhinitis., Disp: 15 mL, Rfl: 3   L-Arginine  1000 MG TABS, Take 1,000 mg by mouth daily., Disp: , Rfl:  meloxicam  (MOBIC ) 15 MG tablet, Take  1/2 to 1 tablet  Daily  with Food  for Pain & Inflammation, Disp: 90 tablet, Rfl: 3   methocarbamol  (ROBAXIN ) 500 MG tablet, Take 1 tablet (500 mg total) by mouth every 6 (six) hours as needed for muscle spasms. (Patient taking differently: Take 500 mg by mouth every 6 (six) hours as needed for muscle spasms. Take 1/2 tablet as needed), Disp: 30 tablet, Rfl: 0   omeprazole  (PRILOSEC) 20 MG capsule, Take 1 capsule (20 mg total) by mouth daily., Disp: 90 capsule, Rfl: 3   OXcarbazepine  (TRILEPTAL ) 150 MG tablet, Take 1 tablet (150 mg total) by mouth 2 (two) times daily., Disp: 60 tablet, Rfl: 11   polyethylene glycol (MIRALAX ) 17 g packet, Take 17 g by mouth daily., Disp: 14 each, Rfl: 0   Probiotic Product (PROBIOTIC PO), Take 1 tablet by mouth daily., Disp: , Rfl:    Pyridoxine HCl (B-6 PO), Take 1 tablet by mouth daily., Disp: , Rfl:   PAST MEDICAL HISTORY: Past Medical History:  Diagnosis Date   Acquired hammer toe of right foot 09/25/2023   Acquired trigger finger of right index finger 09/23/2023   Allergy    Anxiety    Arthritis    in neck   Bursitis of right hip    Carpal tunnel syndrome, bilateral    rt wrist    Cataract    Cervical vertebral fusion    C1-2 posterior fusion   Complete rotator cuff tear of left shoulder    Depression    Difficult intubation 2015   pt states she was told she was a difficult intubation several years ago but has had several intubations since then with no issues   Eagle's syndrome    GERD (gastroesophageal reflux disease)    Hearing loss of both ears 12/25/2014   Has hearing aids/tubes    Heart murmur    as child   HNP (herniated nucleus pulposus)    Ingrown toenail of right foot 2023   Interstitial cystitis    Kidney cysts    followed by Dr. Cam   Osteopenia    Plantar fasciitis    Spinal stenosis    Tingling    rt foot   Wears hearing aid     PAST SURGICAL HISTORY: Past Surgical History:  Procedure Laterality Date   ABDOMINAL HYSTERECTOMY  2003   BSO-fibroids   ANAL SPHINCTEROTOMY     CARPAL TUNNEL RELEASE     LEFT   CARPAL TUNNEL RELEASE Right 2025   and trigger finger   CERVICAL FUSION  02/25/2018   c1-c2 with laminectomy   CESAREAN SECTION     CYSTOSCOPY     HEMORRHOID SURGERY N/A 1998   KNEE ARTHROSCOPY Right 06/22/2019   Dr. Kay    LUMBAR LAMINECTOMY     LUMBAR LAMINECTOMY/DECOMPRESSION MICRODISCECTOMY N/A 08/09/2014   Procedure: MICRO LUMBAR DECOMPRESSION L3-4,  REDO DECOMPRESSION, MICRODISCECTOMY L4-5, FORAMINOTOMY L3-L4, L4-L5 BILATERAL;  Surgeon: Reyes Billing, MD;  Location: [facility];  Service: Orthopedics;  Laterality: N/A;   MYRINGOTOMY     NASAL SINUS SURGERY     POSTERIOR CERVICAL FUSION/FORAMINOTOMY N/A 08/28/2023   Procedure: CERVICAL FOUR-FIVE POSTERIOR CERVICAL FUSION;  Surgeon: Joshua Alm Hamilton, MD;  Location: [facility] OR;  Service: Neurosurgery;  Laterality: N/A;  Posterior cervical fusion with lateral mass fixation - C4-C5, laminectomy C4-5   SPINE SURGERY     TONSILLECTOMY     LEFT only, due to Eagle Syndrome   TONSILLECTOMY  11/2012   right side due to [facility] syndrome   TRIGGER FINGER RELEASE     RIGHT    TYMPANOSTOMY TUBE PLACEMENT      FAMILY HISTORY: Family History  Problem Relation Age of Onset   Diabetes Mother    Hypertension Mother    Hyperlipidemia Mother    Osteoporosis Mother    Arthritis Mother    Hearing loss Mother    Kidney disease Mother    Hypertension Brother    Hyperlipidemia Brother    Prostate cancer Father        Bone   Cancer Father    Alcohol abuse Paternal Uncle    Alcohol abuse Paternal Uncle     SOCIAL HISTORY: Social History   Socioeconomic History   Marital status: Divorced    Spouse name: Not on file   Number of children: 2   Years of education: Not on file   Highest education level: Master's degree (e.g., MA, MS, MEng, MEd, MSW, MBA)  Occupational History   Not on file  Tobacco Use   Smoking status: Former    Current packs/day: 0.00    Average packs/day: 0.5 packs/day for 15.0 years (7.5 ttl pk-yrs)    Types: Cigarettes    Start date: 12/04/1971    Quit date: 12/04/1986    Years since quitting: 36.8   Smokeless tobacco: Never  Vaping Use   Vaping status: Never Used  Substance and Sexual Activity   Alcohol use: Yes    Alcohol/week: 2.0 standard drinks of alcohol    Types: 2 Glasses of wine per week    Comment: once/wk-1-2   Drug use: No   Sexual activity: Not Currently    Partners: Male    Birth control/protection: Surgical    Comment: TAH/BSO  Other Topics Concern   Not on file  Social History Narrative   Right Handed   1 Cup of Hot Tea per Day   1 Coffee per Day      3 grands   Retired Runner, broadcasting/film/video   Social Drivers of Corporate investment banker Strain: Low Risk  (09/15/2023)   Overall Financial Resource Strain (CARDIA)    Difficulty of Paying Living Expenses: Not hard at all  Food Insecurity: No Food Insecurity (09/15/2023)   Hunger Vital Sign    Worried About Running Out of Food in the Last Year: Never true    Ran Out of Food in the Last Year: Never true  Transportation Needs: No Transportation Needs (09/15/2023)    PRAPARE - Administrator, Civil Service (Medical): No    Lack of Transportation (Non-Medical): No  Physical Activity: Inactive (09/15/2023)   Exercise Vital Sign    Days of Exercise per Week: 0 days    Minutes of Exercise per Session: 0 min  Stress: No Stress Concern Present (09/15/2023)   [employer] of Occupational Health - Occupational Stress Questionnaire    Feeling of Stress: Only a little  Recent Concern: Stress - Stress Concern Present (07/17/2023)   [employer] of Occupational Health - Occupational Stress Questionnaire    Feeling of Stress : To some extent  Social Connections: Moderately Isolated (09/15/2023)   Social Connection and Isolation Panel    Frequency of Communication with Friends and Family: More than three times a week    Frequency of Social Gatherings with Friends and Family: Three times a week    Attends Religious Services: Never    Active Member of Clubs or Organizations: Yes  Attends Banker Meetings: 1 to 4 times per year    Marital Status: Divorced  Intimate Partner Violence: Not At Risk (09/15/2023)   Humiliation, Afraid, Rape, and Kick questionnaire    Fear of Current or Ex-Partner: No    Emotionally Abused: No    Physically Abused: No    Sexually Abused: No       PHYSICAL EXAM  Vitals:   10/01/23 1322 10/01/23 1327  BP: (!) 150/75 129/77  Pulse:  66  Weight: 107 lb (48.5 kg)   Height: 4' 10.5 (1.486 m)     Body mass index is 21.98 kg/m.   General: The patient is well-developed and well-nourished and in no acute distress  HEENT:  Head is [place]/AT.  Sclera are anicteric.    Neck: No carotid bruits are noted.  The neck is nontender.  Cardiovascular: The heart has a regular rate and rhythm with a normal S1 and S2. There were no murmurs, gallops or rubs.    Skin: Extremities are without rash or  edema.  Musculoskeletal:  Back is nontender  Neurologic Exam  Mental status: The patient is alert and  oriented x 3 at the time of the examination. The patient has apparent normal recent and remote memory, with an apparently normal attention span and concentration ability.   Speech is normal.  Cranial nerves: Extraocular movements are full.  Facial strength and sensation was normal.  No dysarthria.  Motor:  Muscle bulk is normal.   Tone is normal. Strength is  5 / 5 in all 4 extremities.   Sensory: Sensory testing is intact to pinprick, soft touch and vibration sensation in all 4 extremities.  She had very slight reduced pp on right foot (L5 and S1) compared to left but vibration was equal.   She has altered sensation at the occiput (since C1-C2 surgery)  Coordination: Cerebellar testing reveals good finger-nose-finger and heel-to-shin bilaterally.  Gait and station: Station is normal.   Gait is normal. Tandem gait is normal for age. SABRA  No Romberg sign Reflexes: Deep tendon reflexes are symmetric and normal bilaterally.       DIAGNOSTIC DATA (LABS, IMAGING, TESTING) - I reviewed patient records, labs, notes, testing and imaging myself where available.  Lab Results  Component Value Date   WBC 7.8 08/25/2023   HGB 13.3 08/25/2023   HCT 39.5 08/25/2023   MCV 95.6 08/25/2023   PLT 288 08/25/2023      Component Value Date/Time   NA 138 08/25/2023 1400   K 4.4 08/25/2023 1400   CL 101 08/25/2023 1400   CO2 27 08/25/2023 1400   GLUCOSE 96 08/25/2023 1400   BUN 11 08/25/2023 1400   CREATININE 0.63 08/25/2023 1400   CREATININE 0.62 12/04/2022 1110   CALCIUM  9.6 08/25/2023 1400   PROT 6.7 07/20/2023 0917   PROT 6.6 07/08/2022 1106   ALBUMIN 4.5 07/20/2023 0917   AST 21 07/20/2023 0917   ALT 14 07/20/2023 0917   ALKPHOS 53 07/20/2023 0917   BILITOT 0.4 07/20/2023 0917   GFRNONAA >60 08/25/2023 1400   GFRNONAA 95 07/10/2020 1147   GFRAA 110 07/10/2020 1147   Lab Results  Component Value Date   CHOL 218 (H) 12/04/2022   HDL 92 12/04/2022   LDLCALC 110 (H) 12/04/2022   TRIG 70  12/04/2022   CHOLHDL 2.4 12/04/2022   Lab Results  Component Value Date   HGBA1C 5.8 07/20/2023   Lab Results  Component Value Date   VITAMINB12 1,068 (  H) 07/20/2023   Lab Results  Component Value Date   TSH 1.65 07/20/2023       ASSESSMENT AND PLAN  Cervical spinal stenosis  History of fusion of cervical spine  Numbness  Atypical face pain   She will continue qHS gabapentin  as it helps sleep.   Will add oxcarbazepine  150 mg po bid for atypical facial pain. Stay active and exercise as tolerated Rtc 7 months or prn  This visit is part of a comprehensive longitudinal care medical relationship regarding the patients primary diagnosis of degenerative spine disease and related concerns.     Elizabethann Lackey A. Vear, MD, [facility] 10/01/2023, 2:15 PM Certified in Neurology, Clinical Neurophysiology, Sleep Medicine and Neuroimaging  [facility] [address], [address] [place], [place] [address] [phone]

## 2023-10-05 ENCOUNTER — Encounter: Payer: Self-pay | Admitting: Family Medicine

## 2023-10-08 ENCOUNTER — Other Ambulatory Visit (HOSPITAL_BASED_OUTPATIENT_CLINIC_OR_DEPARTMENT_OTHER): Payer: Self-pay

## 2023-10-08 ENCOUNTER — Encounter (HOSPITAL_BASED_OUTPATIENT_CLINIC_OR_DEPARTMENT_OTHER): Payer: Self-pay | Admitting: Obstetrics & Gynecology

## 2023-10-08 DIAGNOSIS — M85851 Other specified disorders of bone density and structure, right thigh: Secondary | ICD-10-CM

## 2023-10-15 ENCOUNTER — Other Ambulatory Visit (HOSPITAL_BASED_OUTPATIENT_CLINIC_OR_DEPARTMENT_OTHER): Payer: Self-pay | Admitting: Obstetrics & Gynecology

## 2023-10-15 DIAGNOSIS — M542 Cervicalgia: Secondary | ICD-10-CM | POA: Diagnosis not present

## 2023-10-22 DIAGNOSIS — Z860100 Personal history of colon polyps, unspecified: Secondary | ICD-10-CM | POA: Diagnosis not present

## 2023-10-22 DIAGNOSIS — K649 Unspecified hemorrhoids: Secondary | ICD-10-CM | POA: Diagnosis not present

## 2023-10-22 DIAGNOSIS — R1013 Epigastric pain: Secondary | ICD-10-CM | POA: Diagnosis not present

## 2023-10-22 DIAGNOSIS — R110 Nausea: Secondary | ICD-10-CM | POA: Diagnosis not present

## 2023-10-30 DIAGNOSIS — H90A21 Sensorineural hearing loss, unilateral, right ear, with restricted hearing on the contralateral side: Secondary | ICD-10-CM | POA: Diagnosis not present

## 2023-10-30 DIAGNOSIS — H90A32 Mixed conductive and sensorineural hearing loss, unilateral, left ear with restricted hearing on the contralateral side: Secondary | ICD-10-CM | POA: Diagnosis not present

## 2023-11-11 DIAGNOSIS — K2970 Gastritis, unspecified, without bleeding: Secondary | ICD-10-CM | POA: Diagnosis not present

## 2023-11-11 DIAGNOSIS — Z09 Encounter for follow-up examination after completed treatment for conditions other than malignant neoplasm: Secondary | ICD-10-CM | POA: Diagnosis not present

## 2023-11-11 DIAGNOSIS — K5730 Diverticulosis of large intestine without perforation or abscess without bleeding: Secondary | ICD-10-CM | POA: Diagnosis not present

## 2023-11-11 DIAGNOSIS — D123 Benign neoplasm of transverse colon: Secondary | ICD-10-CM | POA: Diagnosis not present

## 2023-11-11 DIAGNOSIS — K2930 Chronic superficial gastritis without bleeding: Secondary | ICD-10-CM | POA: Diagnosis not present

## 2023-11-11 DIAGNOSIS — K649 Unspecified hemorrhoids: Secondary | ICD-10-CM | POA: Diagnosis not present

## 2023-11-11 DIAGNOSIS — Z860101 Personal history of adenomatous and serrated colon polyps: Secondary | ICD-10-CM | POA: Diagnosis not present

## 2023-11-11 DIAGNOSIS — R1013 Epigastric pain: Secondary | ICD-10-CM | POA: Diagnosis not present

## 2023-11-11 LAB — HM COLONOSCOPY

## 2023-11-13 DIAGNOSIS — K2930 Chronic superficial gastritis without bleeding: Secondary | ICD-10-CM | POA: Diagnosis not present

## 2023-11-13 DIAGNOSIS — D123 Benign neoplasm of transverse colon: Secondary | ICD-10-CM | POA: Diagnosis not present

## 2023-11-26 ENCOUNTER — Encounter: Payer: PPO | Admitting: Nurse Practitioner

## 2023-12-04 ENCOUNTER — Encounter: Payer: PPO | Admitting: Nurse Practitioner

## 2023-12-08 ENCOUNTER — Encounter (HOSPITAL_BASED_OUTPATIENT_CLINIC_OR_DEPARTMENT_OTHER): Payer: Self-pay

## 2023-12-15 ENCOUNTER — Ambulatory Visit (INDEPENDENT_AMBULATORY_CARE_PROVIDER_SITE_OTHER): Admitting: Audiology

## 2023-12-15 ENCOUNTER — Ambulatory Visit (INDEPENDENT_AMBULATORY_CARE_PROVIDER_SITE_OTHER): Admitting: Otolaryngology

## 2023-12-15 ENCOUNTER — Encounter (INDEPENDENT_AMBULATORY_CARE_PROVIDER_SITE_OTHER): Payer: Self-pay | Admitting: Otolaryngology

## 2023-12-15 VITALS — BP 121/77 | HR 67 | Ht 58.5 in | Wt 108.0 lb

## 2023-12-15 DIAGNOSIS — R0981 Nasal congestion: Secondary | ICD-10-CM | POA: Diagnosis not present

## 2023-12-15 DIAGNOSIS — H7202 Central perforation of tympanic membrane, left ear: Secondary | ICD-10-CM

## 2023-12-15 DIAGNOSIS — H903 Sensorineural hearing loss, bilateral: Secondary | ICD-10-CM

## 2023-12-15 DIAGNOSIS — J310 Chronic rhinitis: Secondary | ICD-10-CM

## 2023-12-15 DIAGNOSIS — H7292 Unspecified perforation of tympanic membrane, left ear: Secondary | ICD-10-CM | POA: Diagnosis not present

## 2023-12-15 DIAGNOSIS — H6983 Other specified disorders of Eustachian tube, bilateral: Secondary | ICD-10-CM | POA: Insufficient documentation

## 2023-12-15 NOTE — Progress Notes (Signed)
 Patient ID: Helen Glover, female   DOB: 05-21-1950, 73 y.o.   MRN: [id number]  Follow-up: Left tympanic membrane perforation, hearing loss, chronic rhinitis  HPI: The patient is a 73 year old female who returns today for her follow-up evaluation.  The patient has a history of bilateral hearing loss, chronic rhinitis, and eustachian tube dysfunction.  She was previously treated with multiple myringotomy and tube placement.  The tubes have since extruded.  At her last visit 6 months ago, she was noted to have a dry left tympanic membrane perforation.  She also has a history of chronic rhinitis with nasal mucosal congestion.  She was treated with nasal saline irrigation as needed.  She also has a history of bilateral sensorineural hearing loss.  She was recently fitted with bilateral hearing aids.  The patient returns today reporting no significant change in her hearing.  She has not noted any recent ear or sinus infections.  Exam: General: Communicates without difficulty, well nourished, no acute distress. Head: Normocephalic, no evidence injury, no tenderness, facial buttresses intact without stepoff. Face/sinus: No tenderness to palpation and percussion. Facial movement is normal and symmetric. Eyes: PERRL, EOMI. No scleral icterus, conjunctivae clear. Neuro: CN II exam reveals vision grossly intact. No nystagmus at any point of gaze. Ears: Auricles well formed without lesions.  The right tympanic membrane is intact and mobile.  The left tympanic membrane continues to have an inferior perforation.  No drainage is noted in either ear. Nose: External evaluation reveals normal support and skin without lesions. Dorsum is intact. Anterior rhinoscopy reveals congested mucosa over anterior aspect of inferior turbinates and intact septum. No purulence noted. Oral:  Oral cavity and oropharynx are intact, symmetric, without erythema or edema. Mucosa is moist without lesions. Neck: Full range of motion without pain.  There is no significant lymphadenopathy. No masses palpable. Thyroid  bed within normal limits to palpation. Parotid glands and submandibular glands equal bilaterally without mass. Trachea is midline. Neuro:  CN 2-12 grossly intact. Gait normal.   Assessment: 1.  The patient continues to have a dry left tympanic membrane perforation.  No infection is noted today. 2.  Subjectively stable bilateral sensorineural hearing loss. 3.  Chronic rhinitis with nasal mucosal congestion.  No infection is noted today.  Plan: 1.  The physical exam findings are reviewed with the patient. 2.  Continue with nasal saline irrigation as needed. 3.  Continue the use of her hearing aids. 4.  Dry ear precautions on the left side. 5.  The patient will return for reevaluation in 6 months.

## 2023-12-18 DIAGNOSIS — R1013 Epigastric pain: Secondary | ICD-10-CM | POA: Diagnosis not present

## 2023-12-18 DIAGNOSIS — K219 Gastro-esophageal reflux disease without esophagitis: Secondary | ICD-10-CM | POA: Diagnosis not present

## 2023-12-18 DIAGNOSIS — Z860100 Personal history of colon polyps, unspecified: Secondary | ICD-10-CM | POA: Diagnosis not present

## 2023-12-18 DIAGNOSIS — K649 Unspecified hemorrhoids: Secondary | ICD-10-CM | POA: Diagnosis not present

## 2023-12-24 DIAGNOSIS — D1801 Hemangioma of skin and subcutaneous tissue: Secondary | ICD-10-CM | POA: Diagnosis not present

## 2023-12-24 DIAGNOSIS — L814 Other melanin hyperpigmentation: Secondary | ICD-10-CM | POA: Diagnosis not present

## 2023-12-24 DIAGNOSIS — D692 Other nonthrombocytopenic purpura: Secondary | ICD-10-CM | POA: Diagnosis not present

## 2023-12-24 DIAGNOSIS — L821 Other seborrheic keratosis: Secondary | ICD-10-CM | POA: Diagnosis not present

## 2024-01-04 ENCOUNTER — Ambulatory Visit
Admission: EM | Admit: 2024-01-04 | Discharge: 2024-01-04 | Disposition: A | Discharge status: Home / Self Care | Attending: Urgent Care | Admitting: Urgent Care

## 2024-01-04 DIAGNOSIS — J040 Acute laryngitis: Secondary | ICD-10-CM | POA: Diagnosis not present

## 2024-01-04 DIAGNOSIS — B9789 Other viral agents as the cause of diseases classified elsewhere: Secondary | ICD-10-CM | POA: Diagnosis not present

## 2024-01-04 DIAGNOSIS — J310 Chronic rhinitis: Secondary | ICD-10-CM | POA: Diagnosis not present

## 2024-01-04 DIAGNOSIS — J988 Other specified respiratory disorders: Secondary | ICD-10-CM | POA: Diagnosis not present

## 2024-01-04 LAB — POC COVID19/FLU A&B COMBO
Covid Antigen, POC: NEGATIVE
Influenza A Antigen, POC: NEGATIVE
Influenza B Antigen, POC: NEGATIVE

## 2024-01-04 MED ORDER — PROMETHAZINE-DM 6.25-15 MG/5ML PO SYRP: 5.0000 mL | ORAL_SOLUTION | Freq: Every evening | ORAL | 0 refills | Status: DC | PRN

## 2024-01-04 MED ORDER — PREDNISONE 10 MG PO TABS: 30.0000 mg | ORAL_TABLET | Freq: Every day | ORAL | 0 refills | Status: DC

## 2024-01-04 NOTE — ED Triage Notes (Signed)
 Pt c/o mild cough, head/chest congestion, hoarse-sx started 3 days ago-taking tylenol , saline and afrin nasal spray-NAD-steady gait

## 2024-01-04 NOTE — Discharge Instructions (Addendum)
 We will manage this as a viral respiratory infection and laryngitis. For sore throat or cough try using a honey-based tea. Use 3 teaspoons of honey with juice squeezed from half lemon. Place shaved pieces of ginger into 1/2-1 cup of water and warm over stove top. Then mix the ingredients and repeat every 4 hours as needed. Please take Tylenol  500mg -650mg  once every 6 hours for fevers, aches and pains. Hydrate very well with at least 2 liters (64 ounces) of water. Eat light meals such as soups (chicken and noodles, chicken wild rice, vegetable).  Do not eat any foods that you are allergic to.  Start an antihistamine like Zyrtec (10mg  daily) for postnasal drainage, sinus congestion.  You can take this together with prednisone  for your chronic rhinitis and laryngitis. Use cough syrup as needed.

## 2024-01-04 NOTE — ED Provider Notes (Signed)
 [facility]  Note:  This document was prepared using Conservation officer, historic buildings and may include unintentional dictation errors.  MRN: [id number] DOB: 13-Apr-1950  Subjective:   Helen Glover is a 73 y.o. female presenting for 3 day history of sinus congestion, hoarseness, productive cough. No fever, chest congestion, chest pain, shob, wheezing. Has a history of chronic rhinitis. Use meloxicam  daily for her back.   No current facility-administered medications for this encounter.  Current Outpatient Medications:    Ascorbic Acid  (VITAMIN C ) 100 MG tablet, Take 1,000 mg by mouth daily., Disp: , Rfl:    Biotin  1 MG CAPS, Take 1 mg by mouth daily., Disp: , Rfl:    BOOSTRIX 5-2.5-18.5 LF-MCG/0.5 injection, , Disp: , Rfl:    calcium  carbonate (OS-CAL - DOSED IN MG OF ELEMENTAL CALCIUM ) 1250 (500 Ca) MG tablet, Take 2 tablets by mouth daily with breakfast., Disp: , Rfl:    cholecalciferol  (VITAMIN D ) 1000 UNITS tablet, Take 1,000 Units by mouth daily., Disp: , Rfl:    Cholecalciferol  (VITAMIN D ) 50 MCG (2000 UT) CAPS, Take 2,000 Units by mouth every evening., Disp: , Rfl:    estradiol  (ESTRACE ) 0.1 MG/GM vaginal cream, Place 1 Applicatorful vaginally once a week. (Patient taking differently: Place 1 Applicatorful vaginally once a week. Rare use), Disp: , Rfl:    estradiol  (VIVELLE -DOT) 0.025 MG/24HR, Place 1 patch onto the skin 2 (two) times a week., Disp: 24 patch, Rfl: 4   gabapentin  (NEURONTIN ) 100 MG capsule, Take 200 mg by mouth at bedtime., Disp: , Rfl:    GEMTESA 75 MG TABS, Take 75 mg by mouth daily., Disp: , Rfl:    hydrocortisone  (ANUSOL -HC) 2.5 % rectal cream, Place 1 Application rectally 2 (two) times daily., Disp: 30 g, Rfl: 0   ipratropium (ATROVENT ) 0.06 % nasal spray, Place 2 sprays into both nostrils 4 (four) times daily as needed for rhinitis., Disp: 15 mL, Rfl: 3   L-Arginine  1000 MG TABS, Take 1,000 mg by mouth daily., Disp: , Rfl:    meloxicam   (MOBIC ) 15 MG tablet, Take  1/2 to 1 tablet  Daily  with Food  for Pain & Inflammation, Disp: 90 tablet, Rfl: 3   methocarbamol  (ROBAXIN ) 500 MG tablet, Take 1 tablet (500 mg total) by mouth every 6 (six) hours as needed for muscle spasms. (Patient taking differently: Take 500 mg by mouth every 6 (six) hours as needed for muscle spasms. Take 1/2 tablet as needed), Disp: 30 tablet, Rfl: 0   omeprazole  (PRILOSEC) 20 MG capsule, Take 1 capsule (20 mg total) by mouth daily., Disp: 90 capsule, Rfl: 3   OXcarbazepine  (TRILEPTAL ) 150 MG tablet, Take 1 tablet (150 mg total) by mouth 2 (two) times daily., Disp: 60 tablet, Rfl: 11   polyethylene glycol (MIRALAX ) 17 g packet, Take 17 g by mouth daily., Disp: 14 each, Rfl: 0   Probiotic Product (PROBIOTIC PO), Take 1 tablet by mouth daily., Disp: , Rfl:    Pyridoxine HCl (B-6 PO), Take 1 tablet by mouth daily., Disp: , Rfl:    Allergies  Allergen Reactions   Amoxicillin -Pot Clavulanate Other (See Comments)   Levofloxacin Diarrhea   Other     Band-aid   Codeine Hives   Sulfa Antibiotics Nausea And Vomiting    Past Medical History:  Diagnosis Date   Acquired hammer toe of right foot 09/25/2023   Acquired trigger finger of right index finger 09/23/2023   Allergy    Anxiety  Arthritis    in neck   Bursitis of right hip    Carpal tunnel syndrome, bilateral    rt wrist   Cataract    Cervical vertebral fusion    C1-2 posterior fusion   Complete rotator cuff tear of left shoulder    Depression    Difficult intubation 2015   pt states she was told she was a difficult intubation several years ago but has had several intubations since then with no issues   Eagle's syndrome    GERD (gastroesophageal reflux disease)    Hearing loss of both ears 12/25/2014   Has hearing aids/tubes    Heart murmur    as child   HNP (herniated nucleus pulposus)    Ingrown toenail of right foot 2023   Interstitial cystitis    Kidney cysts    followed by Dr.  Cam   Osteopenia    Plantar fasciitis    Spinal stenosis    Tingling    rt foot   Wears hearing aid      Past Surgical History:  Procedure Laterality Date   ABDOMINAL HYSTERECTOMY  2003   BSO-fibroids   ANAL SPHINCTEROTOMY     CARPAL TUNNEL RELEASE     LEFT   CARPAL TUNNEL RELEASE Right 2025   and trigger finger   CERVICAL FUSION  02/25/2018   c1-c2 with laminectomy   CESAREAN SECTION     CYSTOSCOPY     HEMORRHOID SURGERY N/A 1998   KNEE ARTHROSCOPY Right 06/22/2019   Dr. Kay    LUMBAR LAMINECTOMY     LUMBAR LAMINECTOMY/DECOMPRESSION MICRODISCECTOMY N/A 08/09/2014   Procedure: MICRO LUMBAR DECOMPRESSION L3-4,  REDO DECOMPRESSION, MICRODISCECTOMY L4-5, FORAMINOTOMY L3-L4, L4-L5 BILATERAL;  Surgeon: Reyes Billing, MD;  Location: [facility];  Service: Orthopedics;  Laterality: N/A;   MYRINGOTOMY     NASAL SINUS SURGERY     POSTERIOR CERVICAL FUSION/FORAMINOTOMY N/A 08/28/2023   Procedure: CERVICAL FOUR-FIVE POSTERIOR CERVICAL FUSION;  Surgeon: Joshua Alm Hamilton, MD;  [facility] OR;  Service: Neurosurgery;  Laterality: N/A;  Posterior cervical fusion with lateral mass fixation - C4-C5, laminectomy C4-5   SPINE SURGERY     TONSILLECTOMY     LEFT only, due to Eagle Syndrome   TONSILLECTOMY  11/2012   right side due to Eagles syndrome   TRIGGER FINGER RELEASE     RIGHT   TYMPANOSTOMY TUBE PLACEMENT      Family History  Problem Relation Age of Onset   Diabetes Mother    Hypertension Mother    Hyperlipidemia Mother    Osteoporosis Mother    Arthritis Mother    Hearing loss Mother    Kidney disease Mother    Hypertension Brother    Hyperlipidemia Brother    Prostate cancer Father        Bone   Cancer Father    Alcohol abuse Paternal Uncle    Alcohol abuse Paternal Uncle     Social History   Tobacco Use   Smoking status: Former    Current packs/day: 0.00    Average packs/day: 0.5 packs/day for 15.0 years (7.5 ttl pk-yrs)    Types: Cigarettes    Start date:  12/04/1971    Quit date: 12/04/1986    Years since quitting: 37.1   Smokeless tobacco: Never  Vaping Use   Vaping status: Never Used  Substance Use Topics   Alcohol use: Not Currently    Comment: occ   Drug use: No    ROS  Objective:   Vitals: BP 121/61 (BP Location: Right Arm)   Pulse 95   Temp 97.9 F (36.6 C) (Oral)   Resp 17   [name] 10/18/2001   SpO2 99%   Physical Exam Constitutional:      General: She is not in acute distress.    Appearance: Normal appearance. She is well-developed and normal weight. She is not ill-appearing, toxic-appearing or diaphoretic.  HENT:     Head: Normocephalic and atraumatic.     Right Ear: Tympanic membrane, ear canal and external ear normal. No drainage or tenderness. No middle ear effusion. There is no impacted cerumen. Tympanic membrane is not erythematous or bulging.     Left Ear: Tympanic membrane, ear canal and external ear normal. No drainage or tenderness.  No middle ear effusion. There is no impacted cerumen. Tympanic membrane is not erythematous or bulging.     Nose: Nose normal. No congestion or rhinorrhea.     Mouth/Throat:     Mouth: Mucous membranes are moist. No oral lesions.     Pharynx: No pharyngeal swelling, oropharyngeal exudate, posterior oropharyngeal erythema or uvula swelling.     Tonsils: No tonsillar exudate or tonsillar abscesses.  Eyes:     General: No scleral icterus.       Right eye: No discharge.        Left eye: No discharge.     Extraocular Movements: Extraocular movements intact.     Right eye: Normal extraocular motion.     Left eye: Normal extraocular motion.     Conjunctiva/sclera: Conjunctivae normal.  Cardiovascular:     Rate and Rhythm: Normal rate and regular rhythm.     Heart sounds: Normal heart sounds. No murmur heard.    No friction rub. No gallop.  Pulmonary:     Effort: Pulmonary effort is normal. No respiratory distress.     Breath sounds: No stridor. No wheezing, rhonchi or rales.   Chest:     Chest wall: No tenderness.  Musculoskeletal:     Cervical back: Normal range of motion and neck supple.  Lymphadenopathy:     Cervical: No cervical adenopathy.  Skin:    General: Skin is warm and dry.  Neurological:     General: No focal deficit present.     Mental Status: She is alert and oriented to person, place, and time.  Psychiatric:        Mood and Affect: Mood normal.        Behavior: Behavior normal.     Assessment and Plan :   PDMP not reviewed this encounter.  1. Viral respiratory infection   2. Laryngitis   3. Chronic rhinitis    Deferred imaging given clear cardiopulmonary exam, hemodynamically stable vital signs. Patient has chronic rhinitis and laryngitis, therefore recommended prednisone .  Suspect viral URI. Physical exam findings reassuring and vital signs stable for discharge. Advised supportive care, offered symptomatic relief. Counseled patient on potential for adverse effects with medications prescribed/recommended today, ER and return-to-clinic precautions discussed, patient verbalized understanding.     Christopher Savannah, [place] 01/04/24 [id number]

## 2024-01-07 ENCOUNTER — Ambulatory Visit: Payer: Self-pay

## 2024-01-07 NOTE — Telephone Encounter (Signed)
 FYI Only or Action Required?: FYI only for provider: appointment scheduled on 01/08/2024.  Patient was last seen in primary care on 09/25/2023 by Lucius Krabbe, NP.  Called Nurse Triage reporting Cough.  Symptoms began several days ago.  Interventions attempted: OTC medications: Tylenol  and Prescription medications: Promethazine  and prednisone .  Symptoms are: unchanged.  Triage Disposition: See PCP When Office is Open (Within 3 Days)  Patient/caregiver understands and will follow disposition?: Yes        Copied from CRM #[id number]. Topic: Clinical - Red Word Triage >> Jan 07, 2024  8:19 AM Larissa RAMAN wrote: [facility] that prompted transfer to Nurse Triage: cough w/ green phlegm, fatigue Reason for Disposition  [1] Nasal discharge AND [2] present > 10 days  Answer Assessment - Initial Assessment Questions 1. ONSET: When did the cough begin?      Last Friday  2. SEVERITY: How bad is the cough today?      Mild 3. SPUTUM: Describe the color of your sputum (e.g., none, dry cough; clear, white, yellow, green)     Green  4. HEMOPTYSIS: Are you coughing up any blood? If Yes, ask: How much? (e.g., flecks, streaks, tablespoons, etc.)     Denies  5. DIFFICULTY BREATHING: Are you having difficulty breathing? If Yes, ask: How bad is it? (e.g., mild, moderate, severe)      Denies  6. FEVER: Do you have a fever? If Yes, ask: What is your temperature, how was it measured, and when did it start?     Denies but temp is 99.3  7. OTHER SYMPTOMS: Do you have any other symptoms? (e.g., runny nose, wheezing, chest pain)       Fatigue, sore throat, body aches   Taking Tylenol  for symptoms. Nasal drainage is green and has some dried blood coming from her nose as well.  Protocols used: Cough - Acute Productive-A-AH

## 2024-01-08 ENCOUNTER — Encounter: Payer: Self-pay | Admitting: Family Medicine

## 2024-01-08 ENCOUNTER — Ambulatory Visit: Admitting: Family Medicine

## 2024-01-08 VITALS — BP 138/78 | HR 86 | Temp 97.9°F | Ht 58.5 in | Wt 106.8 lb

## 2024-01-08 DIAGNOSIS — J0100 Acute maxillary sinusitis, unspecified: Secondary | ICD-10-CM

## 2024-01-08 MED ORDER — CEFDINIR 300 MG PO CAPS: 300.0000 mg | ORAL_CAPSULE | Freq: Two times a day (BID) | ORAL | 0 refills | Status: DC

## 2024-01-08 NOTE — Patient Instructions (Signed)
 L lysine.   Burning mouth syndrome

## 2024-01-08 NOTE — Progress Notes (Signed)
 Subjective:     Patient ID: Helen Glover, female    DOB: 1950-12-10, 73 y.o.   MRN: [id number]  Chief Complaint  Patient presents with   Cough    Pt stated that she had a cough with some congestion since Friday. COVID/Flu were both negative    Discussed the use of AI scribe software for clinical note transcription with the patient, who gave verbal consent to proceed.  History of Present Illness Helen Glover is a 73 year old female who presents with upper respiratory symptoms.  Her symptoms began a week ago on Friday, with significant worsening on Saturday. She experiences congestion, achiness, and a sore throat, which has since improved. No fever, with her normal temperature around 67F. She has a runny nose, facial pain, and congestion, with mucus described as grayish-green and occasionally containing blood due to dryness.  She visited urgent care on Monday and was diagnosed with laryngitis. She was prescribed prednisone  and cough medicine, although her cough was not severe. She has been using an expectorant since Saturday to prevent mucus from settling in her chest and has also been using saline and initially tried Zycam. She is concerned about her symptoms worsening despite treatment and is worried about the end of her steroid course.  She has a history of sinus infections but has not experienced one in years. Her current symptoms feel similar to past sinus infections. She has not had a fever. She has been avoiding visiting her 12 year old mother to prevent potential transmission of illness.  She mentions a reaction to Augmentin , which causes gastrointestinal upset, but no rash or hives. She is allergic to codeine, which causes a rash and hives. She has a history of ear tube placement.  She also reports a separate issue with her tongue, describing a burning sensation and occasional bumps over the past two months. No visible issues according to her dermatologist and has been  advised to see a dentist. She has resumed taking B12 supplements, suspecting a deficiency might be related to her symptoms.    There are no preventive care reminders to display for this patient.  Past Medical History:  Diagnosis Date   Acquired hammer toe of right foot 09/25/2023   Acquired trigger finger of right index finger 09/23/2023   Allergy    Anxiety    Arthritis    in neck   Bursitis of right hip    Carpal tunnel syndrome, bilateral    rt wrist   Cataract    Cervical vertebral fusion    C1-2 posterior fusion   Complete rotator cuff tear of left shoulder    Depression    Difficult intubation 2015   pt states she was told she was a difficult intubation several years ago but has had several intubations since then with no issues   Eagle's syndrome    GERD (gastroesophageal reflux disease)    Hearing loss of both ears 12/25/2014   Has hearing aids/tubes    Heart murmur    as child   HNP (herniated nucleus pulposus)    Ingrown toenail of right foot 2023   Interstitial cystitis    Kidney cysts    followed by Dr. Cam   Osteopenia    Plantar fasciitis    Spinal stenosis    Tingling    rt foot   Wears hearing aid     Past Surgical History:  Procedure Laterality Date   ABDOMINAL HYSTERECTOMY  2003   BSO-fibroids  ANAL SPHINCTEROTOMY     CARPAL TUNNEL RELEASE     LEFT   CARPAL TUNNEL RELEASE Right 2025   and trigger finger   CERVICAL FUSION  02/25/2018   c1-c2 with laminectomy   CESAREAN SECTION     CYSTOSCOPY     HEMORRHOID SURGERY N/A 1998   KNEE ARTHROSCOPY Right 06/22/2019   Dr. Kay    LUMBAR LAMINECTOMY     LUMBAR LAMINECTOMY/DECOMPRESSION MICRODISCECTOMY N/A 08/09/2014   Procedure: MICRO LUMBAR DECOMPRESSION L3-4,  REDO DECOMPRESSION, MICRODISCECTOMY L4-5, FORAMINOTOMY L3-L4, L4-L5 BILATERAL;  Surgeon: Reyes Billing, MD;  Location: [facility];  Service: Orthopedics;  Laterality: N/A;   MYRINGOTOMY     NASAL SINUS SURGERY     POSTERIOR CERVICAL  FUSION/FORAMINOTOMY N/A 08/28/2023   Procedure: CERVICAL FOUR-FIVE POSTERIOR CERVICAL FUSION;  Surgeon: Joshua Alm Hamilton, MD;  Location: [facility] OR;  Service: Neurosurgery;  Laterality: N/A;  Posterior cervical fusion with lateral mass fixation - C4-C5, laminectomy C4-5   SPINE SURGERY     TONSILLECTOMY     LEFT only, due to Eagle Syndrome   TONSILLECTOMY  11/2012   right side due to Eagles syndrome   TRIGGER FINGER RELEASE     RIGHT   TYMPANOSTOMY TUBE PLACEMENT       Current Outpatient Medications:    Ascorbic Acid  (VITAMIN C ) 100 MG tablet, Take 1,000 mg by mouth daily., Disp: , Rfl:    Biotin  1 MG CAPS, Take 1 mg by mouth daily., Disp: , Rfl:    BOOSTRIX 5-2.5-18.5 LF-MCG/0.5 injection, , Disp: , Rfl:    calcium  carbonate (OS-CAL - DOSED IN MG OF ELEMENTAL CALCIUM ) 1250 (500 Ca) MG tablet, Take 2 tablets by mouth daily with breakfast., Disp: , Rfl:    cefdinir  (OMNICEF ) 300 MG capsule, Take 1 capsule (300 mg total) by mouth 2 (two) times daily., Disp: 14 capsule, Rfl: 0   cholecalciferol  (VITAMIN D ) 1000 UNITS tablet, Take 1,000 Units by mouth daily., Disp: , Rfl:    Cholecalciferol  (VITAMIN D ) 50 MCG (2000 UT) CAPS, Take 2,000 Units by mouth every evening., Disp: , Rfl:    estradiol  (ESTRACE ) 0.1 MG/GM vaginal cream, Place 1 Applicatorful vaginally once a week. (Patient taking differently: Place 1 Applicatorful vaginally once a week. Rare use), Disp: , Rfl:    estradiol  (VIVELLE -DOT) 0.025 MG/24HR, Place 1 patch onto the skin 2 (two) times a week., Disp: 24 patch, Rfl: 4   gabapentin  (NEURONTIN ) 100 MG capsule, Take 200 mg by mouth at bedtime., Disp: , Rfl:    GEMTESA 75 MG TABS, Take 75 mg by mouth daily., Disp: , Rfl:    hydrocortisone  (ANUSOL -HC) 2.5 % rectal cream, Place 1 Application rectally 2 (two) times daily., Disp: 30 g, Rfl: 0   ipratropium (ATROVENT ) 0.06 % nasal spray, Place 2 sprays into both nostrils 4 (four) times daily as needed for rhinitis., Disp: 15 mL, Rfl: 3    L-Arginine  1000 MG TABS, Take 1,000 mg by mouth daily., Disp: , Rfl:    meloxicam  (MOBIC ) 15 MG tablet, Take  1/2 to 1 tablet  Daily  with Food  for Pain & Inflammation, Disp: 90 tablet, Rfl: 3   methocarbamol  (ROBAXIN ) 500 MG tablet, Take 1 tablet (500 mg total) by mouth every 6 (six) hours as needed for muscle spasms. (Patient taking differently: Take 500 mg by mouth every 6 (six) hours as needed for muscle spasms. Take 1/2 tablet as needed), Disp: 30 tablet, Rfl: 0   omeprazole  (PRILOSEC) 20 MG capsule, Take 1  capsule (20 mg total) by mouth daily., Disp: 90 capsule, Rfl: 3   OXcarbazepine  (TRILEPTAL ) 150 MG tablet, Take 1 tablet (150 mg total) by mouth 2 (two) times daily., Disp: 60 tablet, Rfl: 11   polyethylene glycol (MIRALAX ) 17 g packet, Take 17 g by mouth daily., Disp: 14 each, Rfl: 0   predniSONE  (DELTASONE ) 10 MG tablet, Take 3 tablets (30 mg total) by mouth daily with breakfast., Disp: 15 tablet, Rfl: 0   Probiotic Product (PROBIOTIC PO), Take 1 tablet by mouth daily., Disp: , Rfl:    promethazine -dextromethorphan (PROMETHAZINE -DM) 6.25-15 MG/5ML syrup, Take 5 mLs by mouth at bedtime as needed for cough., Disp: 100 mL, Rfl: 0   Pyridoxine HCl (B-6 PO), Take 1 tablet by mouth daily., Disp: , Rfl:   Allergies  Allergen Reactions   Amoxicillin -Pot Clavulanate Diarrhea   Levofloxacin Diarrhea   Other     Band-aid   Codeine Hives   Sulfa Antibiotics Nausea And Vomiting   ROS neg/noncontributory except as noted HPI/below      Objective:     BP 138/78   Pulse 86   Temp 97.9 F (36.6 C)   Ht 4' 10.5 (1.486 m)   Wt 106 lb 12.8 oz (48.4 kg)   [name] 10/18/2001   SpO2 98%   BMI 21.94 kg/m  Wt Readings from Last 3 Encounters:  01/08/24 106 lb 12.8 oz (48.4 kg)  12/15/23 108 lb (49 kg)  10/01/23 107 lb (48.5 kg)    Physical Exam VITALS: T- 97.0, SaO2- 98% GENERAL: Well developed, well nourished, no acute distress. HEAD EYES EARS NOSE THROAT: Normocephalic, atraumatic,  conjunctiva not injected, sclera nonicteric. Oral pharynx clear, moist without exudates. Sinuses tender to percussion, especially right maxillary. Left tympanic membrane has a chronic perforation. Right tympanic membrane has bubbles. Wears hearing aids. ocngested CARDIAC: Regular rate and rhythm, S1 S2 present,  NECK: Supple, no thyromegaly, no nodes, . LUNGS: Clear to auscultation bilaterally, no wheezes. EXTREMITIES: No edema. MUSCULOSKELETAL: No gross abnormalities. NEUROLOGICAL: Alert and oriented x3, cranial nerves II through XII intact. PSYCHIATRIC: Normal mood, good eye contact.       Assessment & Plan:  Acute non-recurrent maxillary sinusitis  Other orders -     Cefdinir ; Take 1 capsule (300 mg total) by mouth 2 (two) times daily.  Dispense: 14 capsule; Refill: 0    Assessment and Plan Assessment & Plan Acute maxillary sinusitis   She experiences congestion, facial pain, and drainage, with no fever. COVID and flu tests were negative. Examination shows tenderness over the right maxillary sinus and bubbles in the tympanic membrane, indicating possible fluid accumulation. Prednisone  provided temporary relief previously, but Augmentin  was avoided due to gastrointestinal side effects. Prescribe Omnicef  (cefdinir ) twice daily. Continue saline nasal spray and expectorant. Finish the current course of prednisone  and resume meloxicam  afterward. Use Tylenol  for achiness.  Chronic perforation of tympanic membrane, left   There is a chronic perforation of the left tympanic membrane with bubbles in the R suggesting possible fluid accumulation.  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 A previous dermatology evaluation was unremarkable. Possible contributing factors include B12 deficiency and Sensodyne toothpaste use. Burning mouth syndrome remains a differential  diagnosis. Take L-lysine supplement and continue B12 supplementation. Reschedule the dental appointment if symptoms persist.     Return if symptoms worsen or fail to improve.  Helen CHRISTELLA Carrel, MD

## 2024-01-09 ENCOUNTER — Encounter: Payer: Self-pay | Admitting: Family Medicine

## 2024-01-18 ENCOUNTER — Other Ambulatory Visit: Payer: Self-pay | Admitting: Family

## 2024-01-18 ENCOUNTER — Telehealth: Payer: Self-pay | Admitting: Family Medicine

## 2024-01-18 MED ORDER — METHYLPREDNISOLONE 4 MG PO TBPK: ORAL_TABLET | ORAL | 0 refills | Status: DC

## 2024-01-18 NOTE — Telephone Encounter (Signed)
 Still have ongoing symptoms / not getting better. Advised Home Care by Access Nurse  Patient Name First: Helen Last: Glover Gender: Female DOB: 02/28/1950 Age: 73 Y 7 M 21 D Return Phone Number: [phone] (Primary), [phone] (Secondary) Address: City/ State/ Zip: [place]  [address] Client [facility] [employer] Provider Wendolyn, Jenkins Contact Type Call Who Is Calling Patient / Member / Family / Caregiver Call Type Triage / Clinical Relationship To Patient Self Return Phone Number [phone] (Primary) Chief Complaint Nasal Congestion Reason for Call Symptomatic / Request for Health Information Initial Comment Caller states she was put on a antibiotic last week for a sinus infection , she is feeling a little better but states she still feels bad . she has aches and congestion Translation No Nurse Assessment Nurse: Merribeth, RN, Alm Date/Time Titus Time): 01/15/2024 8:43:19 AM Confirm and document reason for call. If symptomatic, describe symptoms. ---pt reports finishing antibiotic for sinus infection, c/o I still have congestion, my sore throat is not as bad as it was, I just feel sick, this is going on for 2 wks, I saw pcp 7 days ago, finished 7 day antibiotic, I normally would feel a lot better, denies fever, reports hydration normal, reports Does the patient have any new or worsening symptoms? ---Yes Will a triage be completed? ---Yes Related visit to physician within the last 2 weeks? ---Yes Does the PT have any chronic conditions? (i.e. diabetes, asthma, this includes High risk factors for pregnancy, etc.) ---No Is this a behavioral health or substance abuse call? ---No Guidelines Guideline Title Affirmed Question Affirmed Notes Nurse Date/Time Titus Time) Sinus Infection on Antibiotic Follow-up Call [1] Taking antibiotic AND [2] nose  still blocked Merribeth OBIE Alm 01/15/2024 8:46:58 AM Disp. Time Titus Time) Disposition Final User 01/15/2024 8:51:59 AM Home Care Yes Merribeth RN, Alm Final Disposition 01/15/2024 8:51:59 AM Home Care Yes Merribeth, RN, Alm [place] Disagree/Comply Comply Caller Understands Yes PreDisposition Call Doctor Care Advice Given Per Guideline * You should be able to treat this at home. * Difficulty breathing (and not relieved by cleaning out nose) * Fever lasts over 2 days on antibiotics * Symptoms don't improve by day 4 on antibiotics * You become worse Comments User: Alm Merribeth, RN Date/Time Titus Time): 01/15/2024 8:48:48 AM c/o pain pressure on right side, not severe User: Alm Merribeth, RN Date/Time Titus Time): 01/15/2024 8:49:34 AM pt states improved overall User: Alm Merribeth, RN Date/Time Titus Time): 01/15/2024 8:52:24 AM pt aware of recommended disposition Referrals REFERRED TO PCP OFFICE

## 2024-01-20 ENCOUNTER — Ambulatory Visit: Payer: Self-pay | Admitting: *Deleted

## 2024-01-20 NOTE — Telephone Encounter (Signed)
 FYI Only or Action Required?: FYI only for provider: appointment scheduled on 12/4.  Patient was last seen in primary care on 01/08/2024 by Wendolyn Jenkins Jansky, MD.  Called Nurse Triage reporting Sinusitis.  Symptoms began several weeks ago.  Interventions attempted: OTC medications: saline spray and Prescription medications: cefdinir .  Symptoms are: unchanged.  Triage Disposition: See PCP When Office is Open (Within 3 Days)  Patient/caregiver understands and will follow disposition?: Yes  Copied from CRM #[id number]. Topic: Clinical - Red Word Triage >> Jan 20, 2024  8:05 AM Franky GRADE wrote: Red Word that prompted transfer to Nurse Triage: Patient is calling because she was seen on 01/08/2024 for a possible sinus infection and was given an antibiotics and has finished the cycle but symptoms have not improved, patient is still not feeling well, green mucus , sinus pressure,  constantly tired. Reason for Disposition  [1] Taking antibiotic > 7 days AND [2] nasal discharge not improved  Answer Assessment - Initial Assessment Questions 1. ANTIBIOTIC: What antibiotic are you taking? How many times a day?     Cefdinir  300 mg Oral 2 times daily- finished 11/29 2. ONSET: When was the antibiotic started?     11/21 3. PAIN: How bad is the pain?   (Scale 0-10; or none, mild, moderate or severe)     Slight pain in sinus- patient is able to get mucus out 4. FEVER: Do you have a fever? If Yes, ask: What is it, how was it measured, and when did it start?      no 5. SYMPTOMS: Are there any other symptoms you're concerned about? If Yes, ask: When did it start?     Mainly green mucus, sore throat in am, fatigue, loose stool-mushy  Patient states initially she was given prednisone  from [facility]- before she can to office 11/21. Patient did get Rx notification about prednisone  Rx called to pharmacy -but she is hesitant to take it. Patient states she would like to be rechecked first- she has alo of  activity around other people- elder, very young.  Protocols used: Sinus Infection on Antibiotic Follow-up Call-[facility]

## 2024-01-21 ENCOUNTER — Encounter: Payer: Self-pay | Admitting: Family Medicine

## 2024-01-21 ENCOUNTER — Ambulatory Visit: Admitting: Family Medicine

## 2024-01-21 VITALS — BP 110/68 | HR 72 | Temp 97.4°F | Ht 58.5 in | Wt 104.8 lb

## 2024-01-21 DIAGNOSIS — J329 Chronic sinusitis, unspecified: Secondary | ICD-10-CM

## 2024-01-21 DIAGNOSIS — B9689 Other specified bacterial agents as the cause of diseases classified elsewhere: Secondary | ICD-10-CM

## 2024-01-21 MED ORDER — AZITHROMYCIN 250 MG PO TABS: ORAL_TABLET | ORAL | 0 refills | Status: AC

## 2024-01-21 NOTE — Patient Instructions (Addendum)
 Still think this is sinusitis- potentially bacterial  Since you did well years ago with azithromycin  and we are not 100% sure if you tolerated doxycycline  we opted to trial azithromycin  - please follow up if fail to improve or symptom(s) worsen  Recommended follow up: Return for as needed for new, worsening, persistent symptoms.

## 2024-01-21 NOTE — Progress Notes (Signed)
 Phone [phone] In person visit   Subjective:   Helen Glover is a 73 y.o. year old very pleasant female patient who presents for/with See problem oriented charting Chief Complaint  Patient presents with   Medical Management of Chronic Issues    Has not gotten better after being on antibiotics last dose 11/29; loose stools; headache; facial tenderness; sore throat every morning that resolves during the day; congestions with green mucus; fatigue; feels as if she is not getting well; negative flu and covid at urgent care;     Past Medical History-  Patient Active Problem List   Diagnosis Date Noted   Other specified disorders of eustachian tube, bilateral 12/15/2023   S/P cervical spinal fusion 08/28/2023   Central perforation of tympanic membrane of left ear 12/14/2022   Sensorineural hearing loss, bilateral 12/14/2022   Chronic rhinitis 12/14/2022   Postnasal drip 12/14/2022   Hemorrhoids 04/10/2021   Osteopenia 02/25/2021   Lumbar back pain with radiculopathy affecting right lower extremity 07/10/2020   Aortic atherosclerosis 07/07/2019   Hyperlipidemia    Hypertension    History of prediabetes    Vitamin D  deficiency    GERD (gastroesophageal reflux disease) 11/17/2011   Interstitial cystitis     Medications- reviewed and updated Current Outpatient Medications  Medication Sig Dispense Refill   Ascorbic Acid  (VITAMIN C ) 100 MG tablet Take 1,000 mg by mouth daily.     azithromycin  (ZITHROMAX ) 250 MG tablet Take 2 tablets on day 1, then 1 tablet daily on days 2 through 5 6 tablet 0   Biotin  1 MG CAPS Take 1 mg by mouth daily.     BISACODYL  5 MG EC tablet Take by mouth as directed.     calcium  carbonate (OS-CAL - DOSED IN MG OF ELEMENTAL CALCIUM ) 1250 (500 Ca) MG tablet Take 2 tablets by mouth daily with breakfast.     cholecalciferol  (VITAMIN D ) 1000 UNITS tablet Take 1,000 Units by mouth daily.     Cholecalciferol  (VITAMIN D ) 50 MCG (2000 UT) CAPS Take 2,000 Units by  mouth every evening.     estradiol  (ESTRACE ) 0.1 MG/GM vaginal cream Place 1 Applicatorful vaginally once a week. (Patient taking differently: Place 1 Applicatorful vaginally once a week. Rare use)     estradiol  (VIVELLE -DOT) 0.025 MG/24HR Place 1 patch onto the skin 2 (two) times a week. 24 patch 4   gabapentin  (NEURONTIN ) 100 MG capsule Take 200 mg by mouth at bedtime.     GEMTESA 75 MG TABS Take 75 mg by mouth daily.     hydrocortisone  (ANUSOL -HC) 2.5 % rectal cream Place 1 Application rectally 2 (two) times daily. 30 g 0   ipratropium (ATROVENT ) 0.06 % nasal spray Place 2 sprays into both nostrils 4 (four) times daily as needed for rhinitis. 15 mL 3   L-Arginine  1000 MG TABS Take 1,000 mg by mouth daily.     meloxicam  (MOBIC ) 15 MG tablet Take  1/2 to 1 tablet  Daily  with Food  for Pain & Inflammation 90 tablet 3   methocarbamol  (ROBAXIN ) 500 MG tablet Take 1 tablet (500 mg total) by mouth every 6 (six) hours as needed for muscle spasms. (Patient taking differently: Take 500 mg by mouth every 6 (six) hours as needed for muscle spasms. Take 1/2 tablet as needed) 30 tablet 0   omeprazole  (PRILOSEC) 20 MG capsule Take 1 capsule (20 mg total) by mouth daily. 90 capsule 3   OXcarbazepine  (TRILEPTAL ) 150 MG tablet Take 1 tablet (150  mg total) by mouth 2 (two) times daily. 60 tablet 11   polyethylene glycol (MIRALAX ) 17 g packet Take 17 g by mouth daily. 14 each 0   Probiotic Product (PROBIOTIC PO) Take 1 tablet by mouth daily.     promethazine -dextromethorphan (PROMETHAZINE -DM) 6.25-15 MG/5ML syrup Take 5 mLs by mouth at bedtime as needed for cough. 100 mL 0   Pyridoxine HCl (B-6 PO) Take 1 tablet by mouth daily.     methylPREDNISolone  (MEDROL  DOSEPAK) 4 MG TBPK tablet As directed (Patient not taking: Reported on 01/21/2024) 21 tablet 0   No current facility-administered medications for this visit.     Objective:  BP 110/68 (BP Location: Left Arm, Patient Position: Sitting, Cuff Size: Normal)    Pulse 72   Temp (!) 97.4 F (36.3 C) (Temporal)   Ht 4' 10.5 (1.486 m)   Wt 104 lb 12.8 oz (47.5 kg)   [name] 10/18/2001   SpO2 97%   BMI 21.53 kg/m  Gen: NAD, resting comfortably  HEENT: Turbinates erythematous with yellow drainage, TM cloudy on right but no air fluid level, perforation without drainage in left ear per baseline,  pharynx mildly erythematous with no tonsilar exudate or edema, bilaterally maxillary sinus tenderness but no frontal sinus tenderness CV: RRR no murmurs rubs or gallops Lungs: CTAB no crackles, wheeze, rhonchi Ext: no edema Skin: warm, dry Neuro:  hearing aids bilaterally      Assessment and Plan   # Sinusitis S: Patient was seen by Dr. Wendolyn on 01/08/2024 and diagnosed with maxillary sinusitis-she was treated with cefdinir  300 mg twice daily.  COVID and flu test were negative on 01/04/2024 at urgent care where she was started on prednisone .  She was tender on right maxillary sinus and had bubbles at the tympanic membrane concerning for fluid accumulation.  She has had significant GI side effects on Augmentin  in the past.  So we opted for cefdinir  instead as above. - Chronic perforation of left tympanic membrane was noted  Today she reports on last day of antibiotics 75% improvement from the worst-her last dose was on 01/16/2024.  She also noted loose stools with the antibiotic.  She still has some headaches and facial tenderness.  Has some sore throat in the morning which resolves as the day goes on.  Still getting green mucus from her nasal passages. No fever. Unfortunately after the 29th she has worsened and feels only perhaps 30% better than the worst of her symptoms A/P: Still think this is sinusitis- potentially bacterial (duration and double sickening) still but inadequately treated. Could be atypical bacteria as well so we considered doxycycline  and azithromycin . Didn't tolerate levaquin or Augmentin  at all in past. Ultimately...  Since you did well years  ago with azithromycin  and we are not 100% sure if you tolerated doxycycline  we opted to trial azithromycin  - please follow up if fail to improve or symptom(s) worsen   She has a history of sinus surgery x 3 and we discussed possible referral back to Dr. Teo for his opinion depending on progress but I would be willing to trial doxycycline  for 10 days even after the azithromycin  before making this referral and then she could have follow-up with Dr. Wendolyn in about a month before making that decision  Recommended follow up: Return for as needed for new, worsening, persistent symptoms. Future Appointments  Date Time Provider Department Center  02/22/2024  9:00 AM Wendolyn Jenkins Jansky, MD [facility]  03/08/2024 12:00 PM [facility] [id number]  04/08/2024 10:35 AM Cleotilde Ronal RAMAN, MD [facility] [phone] [address]  05/05/2024 11:30 AM Vear, Charlie LABOR, MD [facility] None  06/16/2024  1:10 PM Karis Clunes, MD [facility] None  09/20/2024  1:40 PM [facility] ANNUAL WELLNESS VISIT 1 [facility]    Lab/Order associations:   ICD-10-CM   1. Bacterial sinusitis  J32.9    B96.89       Meds ordered this encounter  Medications   azithromycin  (ZITHROMAX ) 250 MG tablet    Sig: Take 2 tablets on day 1, then 1 tablet daily on days 2 through 5    Dispense:  6 tablet    Refill:  0    Return precautions advised.  Garnette Lukes, MD

## 2024-01-25 ENCOUNTER — Ambulatory Visit (HOSPITAL_BASED_OUTPATIENT_CLINIC_OR_DEPARTMENT_OTHER): Admitting: Obstetrics & Gynecology

## 2024-01-28 ENCOUNTER — Encounter: Admitting: Family Medicine

## 2024-02-14 ENCOUNTER — Ambulatory Visit
Admission: EM | Admit: 2024-02-14 | Discharge: 2024-02-14 | Disposition: A | Discharge status: Home / Self Care | Attending: Family Medicine | Admitting: Family Medicine

## 2024-02-14 DIAGNOSIS — J101 Influenza due to other identified influenza virus with other respiratory manifestations: Secondary | ICD-10-CM

## 2024-02-14 DIAGNOSIS — R051 Acute cough: Secondary | ICD-10-CM

## 2024-02-14 LAB — POC COVID19/FLU A&B COMBO
Covid Antigen, POC: NEGATIVE
Influenza A Antigen, POC: POSITIVE — AB
Influenza B Antigen, POC: NEGATIVE

## 2024-02-14 MED ORDER — PROMETHAZINE-DM 6.25-15 MG/5ML PO SYRP: 5.0000 mL | ORAL_SOLUTION | Freq: Every evening | ORAL | 0 refills | Status: DC | PRN

## 2024-02-14 MED ORDER — OSELTAMIVIR PHOSPHATE 75 MG PO CAPS: 75.0000 mg | ORAL_CAPSULE | Freq: Two times a day (BID) | ORAL | 0 refills | Status: DC

## 2024-02-14 NOTE — Discharge Instructions (Addendum)
 You tested positive for influenza A.  We take Tamiflu  antiviral twice daily for 5 days.  This helps to prevent complication/hospitalization as well as severity of symptoms from the flu.  It does not make the flu go away.  You may do Promethazine  DM at night as needed for your cough.  Please note this will make you drowsy.  Do not drink alcohol or drive while on this medication.  You may do Tylenol  or ibuprofen  as needed over-the-counter for fever, body aches.  Lots of rest and fluids.  Please follow-up with your PCP if your symptoms do not improve.  Please go to the emergency room if you develop any worsening symptoms.  Hope you feel better soon!

## 2024-02-14 NOTE — ED Provider Notes (Addendum)
 " [facility]    CSN: [id number] Arrival date & time: 02/14/24  1139      History   Chief Complaint Chief Complaint  Patient presents with   Fever   Cough    HPI Helen Glover is a 73 y.o. female  presents for evaluation of URI symptoms for 3-4 days. Patient reports associated symptoms of cough, congestion sore throat with onset of fever of 101 today. Denies N/V/D, ear pain, body aches, shortness of breath. Patient does not have a hx of asthma. Patient is not an active smoker.   Reports sick contacts via grandchildren.  Was recently treated with 2 rounds of antibiotics for sinusitis with last being December 4 where she was placed on azithromycin  by her PCP.  She states symptoms resolved and these are likely new symptoms.  She denies any sinus pain at this time.  Pt has taken cough medicine OTC for symptoms. Pt has no other concerns at this time.    Fever Associated symptoms: congestion, cough and sore throat   Cough Associated symptoms: fever and sore throat     Past Medical History:  Diagnosis Date   Acquired hammer toe of right foot 09/25/2023   Acquired trigger finger of right index finger 09/23/2023   Allergy    Anxiety    Arthritis    in neck   Bursitis of right hip    Carpal tunnel syndrome, bilateral    rt wrist   Cataract    Cervical vertebral fusion    C1-2 posterior fusion   Complete rotator cuff tear of left shoulder    Depression    Difficult intubation 2015   pt states she was told she was a difficult intubation several years ago but has had several intubations since then with no issues   Eagle's syndrome    GERD (gastroesophageal reflux disease)    Hearing loss of both ears 12/25/2014   Has hearing aids/tubes    Heart murmur    as child   HNP (herniated nucleus pulposus)    Ingrown toenail of right foot 2023   Interstitial cystitis    Kidney cysts    followed by Dr. Cam   Osteopenia    Plantar fasciitis    Spinal stenosis     Tingling    rt foot   Wears hearing aid     Patient Active Problem List   Diagnosis Date Noted   Other specified disorders of eustachian tube, bilateral 12/15/2023   S/P cervical spinal fusion 08/28/2023   Central perforation of tympanic membrane of left ear 12/14/2022   Sensorineural hearing loss, bilateral 12/14/2022   Chronic rhinitis 12/14/2022   Postnasal drip 12/14/2022   Hemorrhoids 04/10/2021   Osteopenia 02/25/2021   Lumbar back pain with radiculopathy affecting right lower extremity 07/10/2020   Aortic atherosclerosis 07/07/2019   Hyperlipidemia    Hypertension    History of prediabetes    Vitamin D  deficiency    GERD (gastroesophageal reflux disease) 11/17/2011   Interstitial cystitis     Past Surgical History:  Procedure Laterality Date   ABDOMINAL HYSTERECTOMY  2003   BSO-fibroids   ANAL SPHINCTEROTOMY     CARPAL TUNNEL RELEASE     LEFT   CARPAL TUNNEL RELEASE Right 2025   and trigger finger   CERVICAL FUSION  02/25/2018   c1-c2 with laminectomy   CESAREAN SECTION     CYSTOSCOPY     HEMORRHOID SURGERY N/A 1998   KNEE ARTHROSCOPY Right 06/22/2019  Dr. Kay    LUMBAR LAMINECTOMY     LUMBAR LAMINECTOMY/DECOMPRESSION MICRODISCECTOMY N/A 08/09/2014   Procedure: MICRO LUMBAR DECOMPRESSION L3-4,  REDO DECOMPRESSION, MICRODISCECTOMY L4-5, FORAMINOTOMY L3-L4, L4-L5 BILATERAL;  Surgeon: Reyes Billing, MD;  Location: [facility];  Service: Orthopedics;  Laterality: N/A;   MYRINGOTOMY     NASAL SINUS SURGERY     POSTERIOR CERVICAL FUSION/FORAMINOTOMY N/A 08/28/2023   Procedure: CERVICAL FOUR-FIVE POSTERIOR CERVICAL FUSION;  Surgeon: Joshua Alm Hamilton, MD;  [facility];  Service: Neurosurgery;  Laterality: N/A;  Posterior cervical fusion with lateral mass fixation - C4-C5, laminectomy C4-5   SPINE SURGERY     TONSILLECTOMY     LEFT only, due to Eagle Syndrome   TONSILLECTOMY  11/2012   right side due to Eagles syndrome   TRIGGER FINGER RELEASE     RIGHT    TYMPANOSTOMY TUBE PLACEMENT      OB History     Gravida  2   Para  2   Term  1   Preterm  1   AB  0   Living  2      SAB  0   IAB  0   Ectopic  0   Multiple  0   Live Births               Home Medications    Prior to Admission medications  Medication Sig Start Date End Date Taking? Authorizing Provider  oseltamivir  (TAMIFLU ) 75 MG capsule Take 1 capsule (75 mg total) by mouth every 12 (twelve) hours. 02/14/24  Yes Shritha Bresee, Jodi R, NP  promethazine -dextromethorphan (PROMETHAZINE -DM) 6.25-15 MG/5ML syrup Take 5 mLs by mouth at bedtime as needed for cough. 02/14/24  Yes Zelta Enfield, Jodi R, NP  Ascorbic Acid  (VITAMIN C ) 100 MG tablet Take 1,000 mg by mouth daily.    [provider]  Biotin  1 MG CAPS Take 1 mg by mouth daily.    [provider]  BISACODYL  5 MG EC tablet Take by mouth as directed. 10/22/23   [provider]  calcium  carbonate (OS-CAL - DOSED IN MG OF ELEMENTAL CALCIUM ) 1250 (500 Ca) MG tablet Take 2 tablets by mouth daily with breakfast.    [provider]  cholecalciferol  (VITAMIN D ) 1000 UNITS tablet Take 1,000 Units by mouth daily.    [provider]  Cholecalciferol  (VITAMIN D ) 50 MCG (2000 UT) CAPS Take 2,000 Units by mouth every evening.    [provider]  estradiol  (ESTRACE ) 0.1 MG/GM vaginal cream Place 1 Applicatorful vaginally once a week. Patient taking differently: Place 1 Applicatorful vaginally once a week. Rare use 10/27/22   [provider]  estradiol  (VIVELLE -DOT) 0.025 MG/24HR Place 1 patch onto the skin 2 (two) times a week. 01/12/23   Cleotilde Ronal RAMAN, MD  gabapentin  (NEURONTIN ) 100 MG capsule Take 200 mg by mouth at bedtime.    [provider]  GEMTESA 75 MG TABS Take 75 mg by mouth daily.    [provider]  hydrocortisone  (ANUSOL -HC) 2.5 % rectal cream Place 1 Application rectally 2 (two) times daily. 12/09/21   Wilkinson, Dana E, NP  ipratropium (ATROVENT ) 0.06  % nasal spray Place 2 sprays into both nostrils 4 (four) times daily as needed for rhinitis. 07/20/23   Wendolyn Jenkins Jansky, MD  L-Arginine  1000 MG TABS Take 1,000 mg by mouth daily.    [provider]  meloxicam  (MOBIC ) 15 MG tablet Take  1/2 to 1 tablet  Daily  with Food  for Pain & Inflammation 07/20/23   Wendolyn Jenkins Jansky, MD  methocarbamol  (ROBAXIN ) 500 MG tablet Take 1 tablet (500 mg total) by mouth every 6 (six) hours as needed for muscle spasms. Patient taking differently: Take 500 mg by mouth every 6 (six) hours as needed for muscle spasms. Take 1/2 tablet as needed 08/29/23   Bergman, Meghan D, NP  methylPREDNISolone  (MEDROL  DOSEPAK) 4 MG TBPK tablet As directed Patient not taking: Reported on 01/21/2024 01/18/24   Webb, Padonda B, FNP  omeprazole  (PRILOSEC) 20 MG capsule Take 1 capsule (20 mg total) by mouth daily. 07/20/23   Wendolyn Jenkins Jansky, MD  OXcarbazepine  (TRILEPTAL ) 150 MG tablet Take 1 tablet (150 mg total) by mouth 2 (two) times daily. 10/01/23   Sater, Charlie LABOR, MD  polyethylene glycol (MIRALAX ) 17 g packet Take 17 g by mouth daily. 08/29/23   Bergman, Meghan D, NP  Probiotic Product (PROBIOTIC PO) Take 1 tablet by mouth daily.    [provider]  Pyridoxine HCl (B-6 PO) Take 1 tablet by mouth daily.    [provider]    Family History Family History  Problem Relation Age of Onset   Diabetes Mother    Hypertension Mother    Hyperlipidemia Mother    Osteoporosis Mother    Arthritis Mother    Hearing loss Mother    Kidney disease Mother    Hypertension Brother    Hyperlipidemia Brother    Prostate cancer Father        Bone   Cancer Father    Alcohol abuse Paternal Uncle    Alcohol abuse Paternal Uncle     Social History Social History[1]   Allergies   Amoxicillin -pot clavulanate, Levofloxacin, Other, Codeine, and Sulfa antibiotics   Review of Systems Review of Systems  Constitutional:  Positive for fever.  HENT:  Positive for congestion and  sore throat.   Respiratory:  Positive for cough.      Physical Exam Triage Vital Signs ED Triage Vitals  Encounter Vitals Group     BP 02/14/24 1312 (!) 123/59     Girls Systolic BP Percentile --      Girls Diastolic BP Percentile --      Boys Systolic BP Percentile --      Boys Diastolic BP Percentile --      Pulse Rate 02/14/24 1312 98     Resp 02/14/24 1312 19     Temp 02/14/24 1312 98.2 F (36.8 C)     Temp Source 02/14/24 1312 Oral     SpO2 02/14/24 1312 95 %     Weight --      Height --      Head Circumference --      Peak Flow --      Pain Score 02/14/24 1311 7     Pain Loc --      Pain Education --      Exclude from Growth Chart --    No data found.  Updated Vital Signs BP (!) 123/59 (BP Location: Right Arm)   Pulse 98   Temp 98.2 F (36.8 C) (Oral)   Resp 19   [name] 10/18/2001   SpO2 95%   Visual Acuity Right Eye Distance:   Left Eye Distance:   Bilateral Distance:    Right Eye Near:   Left Eye Near:    Bilateral Near:     Physical Exam Vitals and nursing note reviewed.  Constitutional:      General: She is  not in acute distress.    Appearance: She is well-developed. She is not ill-appearing.  HENT:     Head: Normocephalic and atraumatic.     Right Ear: Tympanic membrane and ear canal normal.     Left Ear: Tympanic membrane and ear canal normal.     Nose: Congestion present.     Right Sinus: No maxillary sinus tenderness or frontal sinus tenderness.     Left Sinus: No maxillary sinus tenderness or frontal sinus tenderness.     Mouth/Throat:     Mouth: Mucous membranes are moist.     Pharynx: Oropharynx is clear. Uvula midline. Postnasal drip present. No oropharyngeal exudate or posterior oropharyngeal erythema.     Tonsils: No tonsillar exudate or tonsillar abscesses.  Eyes:     Conjunctiva/sclera: Conjunctivae normal.     Pupils: Pupils are equal, round, and reactive to light.  Cardiovascular:     Rate and Rhythm: Normal rate and regular  rhythm.     Heart sounds: Normal heart sounds.  Pulmonary:     Effort: Pulmonary effort is normal.     Breath sounds: Normal breath sounds. No wheezing, rhonchi or rales.  Musculoskeletal:     Cervical back: Normal range of motion and neck supple.  Lymphadenopathy:     Cervical: No cervical adenopathy.  Skin:    General: Skin is warm and dry.  Neurological:     General: No focal deficit present.     Mental Status: She is alert and oriented to person, place, and time.  Psychiatric:        Mood and Affect: Mood normal.        Behavior: Behavior normal.      UC Treatments / Results  Labs (all labs ordered are listed, but only abnormal results are displayed) Labs Reviewed  POC COVID19/FLU A&B COMBO - Abnormal; Notable for the following components:      Result Value   Influenza A Antigen, POC Positive (*)    All other components within normal limits   Basic metabolic panel per protocol Order: [id number]  Status: Final result     Next appt: 02/22/2024 at 09:00 AM in Family Medicine Gerard CHRISTELLA Carrel, MD)     Dx: Hypertension, unspecified type   Test Result Released: Yes (seen)   0 Result Notes          Component Ref Range & Units (hover) 5 mo ago (08/25/23) 6 mo ago (07/20/23) 1 yr ago (12/04/22) 1 yr ago (08/28/22) 2 yr ago (02/07/22) 2 yr ago (11/21/21) 2 yr ago (07/11/21)  Sodium 138 135 R 139 R 136 R 141 138 R 139 R  Potassium 4.4 4.2 R 4.3 R 4.8 R 3.5 4.2 R 4.4 R  Chloride 101 101 R 101 R 101 R 106 103 R 104 R  CO2 27 28 R 27 R 28 R 28 27 R 26 R  Glucose, Bld 96 78 80 R, CM 88 R, CM 107 High  CM 80 R, CM 90 R, CM  Comment: Glucose reference range applies only to samples taken after fasting for at least 8 hours.  BUN 11 13 R 13 R 16 R 15 12 R 15 R  Creatinine, Ser 0.63 0.64 R 0.62 R 0.62 R 0.60 0.69 R 0.68 R  Calcium  9.6 9.2 R 9.4 R 9.4 R 9.1 9.2 R 9.3 R  GFR, Estimated >60    >60 CM    Comment: (NOTE) Calculated using the CKD-EPI Creatinine Equation (2021)  Anion  gap 10     7 CM    Comment: Performed at [facility], [address], [place], [place] [address]  Resulting Agency [facility] [place] [facility] [place] [facility] [place] [place] [facility] [place]        Specimen Collected: 08/25/23 14:00 Last Resulted: 08/25/23 15:30   EKG   Radiology No results found.  Procedures Procedures (including critical care time)  Medications Ordered in UC Medications - No data to display  Initial Impression / Assessment and Plan / UC Course  I have reviewed the triage vital signs and the nursing notes.  Pertinent labs & imaging results that were available during my care of the patient were reviewed by me and considered in my medical decision making (see chart for details).     Reviewed exam and symptoms with patient.  No red flags.  Positive influenza A, start Tamiflu .  Promethazine  DM as needed for cough and patient is tolerated well in the past.  Discussed viral illness and continued symptomatic treatment.  PCP follow-up if symptoms do not improve.  ER precautions reviewed. Final Clinical Impressions(s) / UC Diagnoses   Final diagnoses:  Acute cough  Influenza A     Discharge Instructions      You tested positive for influenza A.  We take Tamiflu  antiviral twice daily for 5 days.  This helps to prevent complication/hospitalization as well as severity of symptoms from the flu.  It does not make the flu go away.  You may do Promethazine  DM at night as needed for your cough.  Please note this will make you drowsy.  Do not drink alcohol or drive while on this medication.  You may do Tylenol  or ibuprofen  as needed over-the-counter for fever, body aches.  Lots of rest and fluids.  Please follow-up with your PCP if your symptoms do not improve.  Please go to the emergency room if you develop any worsening symptoms.  Hope you feel better soon!     ED Prescriptions      Medication Sig Dispense Auth. Provider   oseltamivir  (TAMIFLU ) 75 MG capsule Take 1 capsule (75 mg total) by mouth every 12 (twelve) hours. 10 capsule Arling Cerone, Jodi R, NP   promethazine -dextromethorphan (PROMETHAZINE -DM) 6.25-15 MG/5ML syrup Take 5 mLs by mouth at bedtime as needed for cough. 50 mL Tel Hevia, Jodi R, NP      PDMP not reviewed this encounter.    Loreda Myla SAUNDERS, NP 02/14/24 1346     [1]  Social History Tobacco Use   Smoking status: Former    Current packs/day: 0.00    Average packs/day: 0.5 packs/day for 15.0 years (7.5 ttl pk-yrs)    Types: Cigarettes    Start date: 12/04/1971    Quit date: 12/04/1986    Years since quitting: 37.2   Smokeless tobacco: Never  Vaping Use   Vaping status: Never Used  Substance Use Topics   Alcohol use: Not Currently    Comment: occ   Drug use: No     Loreda Myla SAUNDERS, NP 02/14/24 1347  "

## 2024-02-14 NOTE — ED Triage Notes (Signed)
 Pt present with c/o a cough and chest congestion x 1 month. States she was taking Zithromax , was feeling better and now the symptoms are back and worse. Pt states she woke up this morning with a fever. Pt took tylenol  at 0930. C/o a headache.

## 2024-02-16 ENCOUNTER — Ambulatory Visit: Payer: Self-pay

## 2024-02-16 ENCOUNTER — Ambulatory Visit
Admission: RE | Admit: 2024-02-16 | Discharge: 2024-02-16 | Disposition: A | Discharge status: Home / Self Care | Source: Ambulatory Visit | Attending: Family Medicine

## 2024-02-16 VITALS — BP 125/81 | HR 76 | Temp 97.9°F | Resp 17

## 2024-02-16 DIAGNOSIS — J101 Influenza due to other identified influenza virus with other respiratory manifestations: Secondary | ICD-10-CM

## 2024-02-16 DIAGNOSIS — H65192 Other acute nonsuppurative otitis media, left ear: Secondary | ICD-10-CM

## 2024-02-16 MED ORDER — AZITHROMYCIN 250 MG PO TABS: ORAL_TABLET | ORAL | 0 refills | Status: DC

## 2024-02-16 NOTE — ED Triage Notes (Signed)
 Pt has c/o left ear fullness and left throat irritation that started yesterday. Pt was previously seen and diagnosed with Flu A, and has some lingering symptoms. Pt has been taking tylenol  and tamiflu  at home and states has been feeling better. Last dose of tylenol  was 630 am.

## 2024-02-16 NOTE — ED Provider Notes (Signed)
 " [facility]  Note:  This document was prepared using Conservation officer, historic buildings and may include unintentional dictation errors.  MRN: [id number] DOB: Oct 03, 1950  Subjective:   Helen Glover is a 73 y.o. female presenting for recheck on persistent malaise, 2-day history of left ear fullness and pain, left-sided throat pain.  Patient was diagnosed with influenza A 02/14/2024.  Has a history of sinus infections and ear infections.  Patient last underwent a course of azithromycin  with good relief in early December.  Has a history of a ruptured TM due to having ear tubes placed in the left side.  No ongoing chest pain, shortness of breath, wheezing.  Current Outpatient Medications  Medication Instructions   Biotin  1 mg, Daily   BISACODYL  5 MG EC tablet As directed   calcium  carbonate (OS-CAL - DOSED IN MG OF ELEMENTAL CALCIUM ) 1250 (500 Ca) MG tablet 2 tablets, Daily with breakfast   cholecalciferol  (VITAMIN D ) 1,000 Units, Daily   estradiol  (ESTRACE ) 0.1 MG/GM vaginal cream 1 Applicatorful, Weekly   estradiol  (VIVELLE -DOT) 0.025 MG/24HR 1 patch, Transdermal, 2 times weekly   gabapentin  (NEURONTIN ) 200 mg, Nightly   Gemtesa 75 mg, Daily   hydrocortisone  (ANUSOL -HC) 2.5 % rectal cream 1 Application, Rectal, 2 times daily   ipratropium (ATROVENT ) 0.06 % nasal spray 2 sprays, Each Nare, 4 times daily PRN   L-Arginine  1,000 mg, Daily   meloxicam  (MOBIC ) 15 MG tablet Take  1/2 to 1 tablet  Daily  with Food  for Pain & Inflammation   methocarbamol  (ROBAXIN ) 500 mg, Oral, Every 6 hours PRN   methylPREDNISolone  (MEDROL  DOSEPAK) 4 MG TBPK tablet As directed   omeprazole  (PRILOSEC) 20 mg, Oral, Daily   oseltamivir  (TAMIFLU ) 75 mg, Oral, Every 12 hours   OXcarbazepine  (TRILEPTAL ) 150 mg, Oral, 2 times daily   polyethylene glycol (MIRALAX ) 17 g, Oral, Daily   Probiotic Product (PROBIOTIC PO) 1 tablet, Daily   promethazine -dextromethorphan (PROMETHAZINE -DM) 6.25-15  MG/5ML syrup 5 mLs, Oral, At bedtime PRN   Pyridoxine HCl (B-6 PO) 1 tablet, Daily   vitamin C  1,000 mg, Daily   Vitamin D  2,000 Units, Every evening    Allergies[1]  Past Medical History:  Diagnosis Date   Acquired hammer toe of right foot 09/25/2023   Acquired trigger finger of right index finger 09/23/2023   Allergy    Anxiety    Arthritis    in neck   Bursitis of right hip    Carpal tunnel syndrome, bilateral    rt wrist   Cataract    Cervical vertebral fusion    C1-2 posterior fusion   Complete rotator cuff tear of left shoulder    Depression    Difficult intubation 2015   pt states she was told she was a difficult intubation several years ago but has had several intubations since then with no issues   Eagle's syndrome    GERD (gastroesophageal reflux disease)    Hearing loss of both ears 12/25/2014   Has hearing aids/tubes    Heart murmur    as child   HNP (herniated nucleus pulposus)    Ingrown toenail of right foot 2023   Interstitial cystitis    Kidney cysts    followed by Dr. Cam   Osteopenia    Plantar fasciitis    Spinal stenosis    Tingling    rt foot   Wears hearing aid      Past Surgical History:  Procedure Laterality Date  ABDOMINAL HYSTERECTOMY  2003   BSO-fibroids   ANAL SPHINCTEROTOMY     CARPAL TUNNEL RELEASE     LEFT   CARPAL TUNNEL RELEASE Right 2025   and trigger finger   CERVICAL FUSION  02/25/2018   c1-c2 with laminectomy   CESAREAN SECTION     CYSTOSCOPY     HEMORRHOID SURGERY N/A 1998   KNEE ARTHROSCOPY Right 06/22/2019   Dr. Kay    LUMBAR LAMINECTOMY     LUMBAR LAMINECTOMY/DECOMPRESSION MICRODISCECTOMY N/A 08/09/2014   Procedure: MICRO LUMBAR DECOMPRESSION L3-4,  REDO DECOMPRESSION, MICRODISCECTOMY L4-5, FORAMINOTOMY L3-L4, L4-L5 BILATERAL;  Surgeon: Reyes Billing, MD;  Location: [facility];  Service: Orthopedics;  Laterality: N/A;   MYRINGOTOMY     NASAL SINUS SURGERY     POSTERIOR CERVICAL FUSION/FORAMINOTOMY N/A  08/28/2023   Procedure: CERVICAL FOUR-FIVE POSTERIOR CERVICAL FUSION;  Surgeon: Joshua Alm Hamilton, MD;  [facility];  Service: Neurosurgery;  Laterality: N/A;  Posterior cervical fusion with lateral mass fixation - C4-C5, laminectomy C4-5   SPINE SURGERY     TONSILLECTOMY     LEFT only, due to Eagle Syndrome   TONSILLECTOMY  11/2012   right side due to Eagles syndrome   TRIGGER FINGER RELEASE     RIGHT   TYMPANOSTOMY TUBE PLACEMENT      Family History  Problem Relation Age of Onset   Diabetes Mother    Hypertension Mother    Hyperlipidemia Mother    Osteoporosis Mother    Arthritis Mother    Hearing loss Mother    Kidney disease Mother    Hypertension Brother    Hyperlipidemia Brother    Prostate cancer Father        Bone   Cancer Father    Alcohol abuse Paternal Uncle    Alcohol abuse Paternal Uncle     Social History   Occupational History   Not on file  Tobacco Use   Smoking status: Former    Current packs/day: 0.00    Average packs/day: 0.5 packs/day for 15.0 years (7.5 ttl pk-yrs)    Types: Cigarettes    Start date: 12/04/1971    Quit date: 12/04/1986    Years since quitting: 37.2   Smokeless tobacco: Never  Vaping Use   Vaping status: Never Used  Substance and Sexual Activity   Alcohol use: Not Currently    Comment: occ   Drug use: No   Sexual activity: Not Currently    Partners: Male    Birth control/protection: Surgical    Comment: TAH/BSO     ROS   Objective:   Vitals: BP 125/81 (BP Location: Left Arm)   Pulse 76   Temp 97.9 F (36.6 C) (Oral)   Resp 17   [name] 10/18/2001   SpO2 98%   Physical Exam Constitutional:      General: She is not in acute distress.    Appearance: Normal appearance. She is well-developed. She is not ill-appearing, toxic-appearing or diaphoretic.  HENT:     Head: Normocephalic and atraumatic.     Right Ear: Tympanic membrane, ear canal and external ear normal. No tenderness. There is no impacted cerumen.  Tympanic membrane is not injected, perforated, erythematous or bulging.     Left Ear: Ear canal and external ear normal. No tenderness. There is no impacted cerumen. Tympanic membrane is not injected, perforated, erythematous or bulging.     Ears:     Comments: Perforated TM with significant ear effusion.    Nose: Nose normal.  Mouth/Throat:     Mouth: Mucous membranes are moist.     Pharynx: No pharyngeal swelling, oropharyngeal exudate, posterior oropharyngeal erythema or uvula swelling.     Tonsils: No tonsillar exudate or tonsillar abscesses. 0 on the right. 0 on the left.  Eyes:     General: No scleral icterus.       Right eye: No discharge.        Left eye: No discharge.     Extraocular Movements: Extraocular movements intact.  Cardiovascular:     Rate and Rhythm: Normal rate and regular rhythm.     Heart sounds: Normal heart sounds. No murmur heard.    No friction rub. No gallop.  Pulmonary:     Effort: Pulmonary effort is normal. No respiratory distress.     Breath sounds: No stridor. No wheezing, rhonchi or rales.  Chest:     Chest wall: No tenderness.  Skin:    General: Skin is warm and dry.  Neurological:     General: No focal deficit present.     Mental Status: She is alert and oriented to person, place, and time.  Psychiatric:        Mood and Affect: Mood normal.        Behavior: Behavior normal.     Assessment and Plan :   PDMP not reviewed this encounter.  1. Other non-recurrent acute nonsuppurative otitis media of left ear   2. Influenza A      Will cover for secondary otitis media of the left side with azithromycin .  Recommend supportive care otherwise.  Counseled patient on potential for adverse effects with medications prescribed/recommended today, ER and return-to-clinic precautions discussed, patient verbalized understanding.     [1]  Allergies Allergen Reactions   Amoxicillin -Pot Clavulanate Diarrhea   Levofloxacin Diarrhea   Other      Band-aid   Codeine Hives   Sulfa Antibiotics Nausea And Vomiting     Christopher Savannah, PA-C 02/16/24 1912  "

## 2024-02-16 NOTE — Telephone Encounter (Signed)
" °  FYI Only or Action Required?: FYI only for provider: UC advised.  Patient was last seen in primary care on 01/21/2024 by Katrinka Garnette KIDD, MD.  Called Nurse Triage reporting Otalgia.  Symptoms began today.  Interventions attempted: Nothing.  Symptoms are: stable.  Triage Disposition: See Physician Within 24 Hours  Patient/caregiver understands and will follow disposition?:     Copied from CRM #[id number]. Topic: Clinical - Red Word Triage >> Feb 16, 2024  8:46 AM Helen Glover wrote: Patient was seen 02/14/2024 at [facility] at [facility] - her left ear and throat are painful and wants to know what to do. She tested positive for the flu and wants to know if the symptoms are related. Reason for Disposition  Earache  (Exceptions: Brief ear pain of lasting less than 60 minutes, or earache occurring during air travel.)  Answer Assessment - Initial Assessment Questions 1. LOCATION: Which ear is involved?     left 2. ONSET: When did the ear pain start?      Last night 3. SEVERITY: How bad is the pain?  (Scale 1-10; mild, moderate or severe)     6/10 4. URI SYMPTOMS: Do you have a runny nose or cough?     Dx with flu 12/28, has cough, runny nose 5. FEVER: Do you have a fever? If Yes, ask: What is your temperature, how was it measured, and when did it start?     denies 6. CAUSE: Have you been swimming recently?, How often do you use Q-TIPS?, Have you had any recent air travel or scuba diving?     NO 7. OTHER SYMPTOMS: Do you have any other symptoms? (e.g., decreased hearing, dizziness, headache, stiff neck, vomiting)     no  Protocols used: Earache-A-AH  "

## 2024-02-22 ENCOUNTER — Encounter: Payer: Self-pay | Admitting: Family Medicine

## 2024-02-22 ENCOUNTER — Encounter: Admitting: Family Medicine

## 2024-02-22 VITALS — BP 116/68 | HR 74 | Temp 97.4°F | Ht 58.5 in | Wt 106.0 lb

## 2024-02-22 DIAGNOSIS — Z0000 Encounter for general adult medical examination without abnormal findings: Secondary | ICD-10-CM

## 2024-02-22 DIAGNOSIS — K219 Gastro-esophageal reflux disease without esophagitis: Secondary | ICD-10-CM

## 2024-02-22 DIAGNOSIS — Z87898 Personal history of other specified conditions: Secondary | ICD-10-CM

## 2024-02-22 DIAGNOSIS — Z79899 Other long term (current) drug therapy: Secondary | ICD-10-CM | POA: Diagnosis not present

## 2024-02-22 DIAGNOSIS — E559 Vitamin D deficiency, unspecified: Secondary | ICD-10-CM

## 2024-02-22 LAB — CBC WITH DIFFERENTIAL/PLATELET
Basophils Absolute: 0 K/uL (ref 0.0–0.1)
Basophils Relative: 0.5 % (ref 0.0–3.0)
Eosinophils Absolute: 0.1 K/uL (ref 0.0–0.7)
Eosinophils Relative: 1.2 % (ref 0.0–5.0)
HCT: 36.1 % (ref 36.0–46.0)
Hemoglobin: 12.4 g/dL (ref 12.0–15.0)
Lymphocytes Relative: 33.8 % (ref 12.0–46.0)
Lymphs Abs: 1.6 K/uL (ref 0.7–4.0)
MCHC: 34.4 g/dL (ref 30.0–36.0)
MCV: 93.3 fl (ref 78.0–100.0)
Monocytes Absolute: 0.4 K/uL (ref 0.1–1.0)
Monocytes Relative: 9.5 % (ref 3.0–12.0)
Neutro Abs: 2.5 K/uL (ref 1.4–7.7)
Neutrophils Relative %: 55 % (ref 43.0–77.0)
Platelets: 306 K/uL (ref 150.0–400.0)
RBC: 3.87 Mil/uL (ref 3.87–5.11)
RDW: 13.6 % (ref 11.5–15.5)
WBC: 4.6 K/uL (ref 4.0–10.5)

## 2024-02-22 LAB — COMPREHENSIVE METABOLIC PANEL WITH GFR
ALT: 31 U/L (ref 3–35)
AST: 20 U/L (ref 5–37)
Albumin: 4.3 g/dL (ref 3.5–5.2)
Alkaline Phosphatase: 61 U/L (ref 39–117)
BUN: 13 mg/dL (ref 6–23)
CO2: 29 meq/L (ref 19–32)
Calcium: 8.7 mg/dL (ref 8.4–10.5)
Chloride: 102 meq/L (ref 96–112)
Creatinine, Ser: 0.56 mg/dL (ref 0.40–1.20)
GFR: 90.97 mL/min
Glucose, Bld: 82 mg/dL (ref 70–99)
Potassium: 4 meq/L (ref 3.5–5.1)
Sodium: 135 meq/L (ref 135–145)
Total Bilirubin: 0.4 mg/dL (ref 0.2–1.2)
Total Protein: 6.8 g/dL (ref 6.0–8.3)

## 2024-02-22 LAB — HEMOGLOBIN A1C: Hgb A1c MFr Bld: 5.6 % (ref 4.6–6.5)

## 2024-02-22 NOTE — Progress Notes (Signed)
 " Phone [phone]   Subjective:   Patient is a 74 y.o. female presenting for annual physical.    Chief Complaint  Patient presents with   Annual Exam    Pt is here for CPE    Discussed the use of AI scribe software for clinical note transcription with the patient, who gave verbal consent to proceed.  History of Present Illness Helen Glover is a 74 year old female who presents for her annual physical exam.  She has been experiencing issues with acid reflux, for which she takes omeprazole  daily. She mentions a persistent 'tongue thing' that is not as severe as before, and she sometimes wakes up with a sore throat that resolves upon getting up, which she suspects might be related to acid reflux. She is scheduled to see her gastroenterologist at the end of the month.  She continues to take her medications, including Vivelle -Dot, Gemtesa for bladder issues, and omeprazole  daily. She also takes meloxicam  and Robaxin  as needed, though she notes that meloxicam  is taken occasionally and is only somewhat effective. She is starting physical therapy for her back pain and plans to see a pain management specialist.  She recently had the flu and a sinus infection, which led to a sore throat and congestion. Her throat soreness resolves upon waking, and she has been experiencing a cough with occasional yellow phlegm, which she believes is improving.  She has a history of balance issues, which she attributes to ear problems. She practices balance exercises daily to improve her stability.  She reports urinary issues, noting that she sometimes retains urine at night and gets up twice, which she was told is normal for her age.  She has a corn between her toes that has persisted for five years, for which she has used over-the-counter treatments without success.  She has been feeling down due to her recent illnesses but is not suicidal. She has scheduled an appointment with her therapist to discuss  her feelings.  No major headaches, dizziness, or chest pain. She notes decreased stamina and fatigue after prolonged activity, such as walking for three hours with her grandchildren on Halloween.    See problem oriented charting- ROS- ROS: Gen: no fever, chills  Skin: no rash, itching ENT: HPI Eyes: no blurry vision, double vision Resp: HPI CV: no CP, palpitations, LE edema,  GI: no heartburn, n/v/d/c, abd pain GU: doing better on gemtesa MSK: back Neuro: no dizziness, headache, weakness, vertigo Psych: no depression, anxiety, insomnia, SI   The following were reviewed and entered/updated in epic: Past Medical History:  Diagnosis Date   Acquired hammer toe of right foot 09/25/2023   Acquired trigger finger of right index finger 09/23/2023   Allergy    Anxiety    Arthritis    in neck   Bursitis of right hip    Carpal tunnel syndrome, bilateral    rt wrist   Cataract    Cervical vertebral fusion    C1-2 posterior fusion   Complete rotator cuff tear of left shoulder    Depression    Difficult intubation 2015   pt states she was told she was a difficult intubation several years ago but has had several intubations since then with no issues   Eagle's syndrome    GERD (gastroesophageal reflux disease)    Hearing loss of both ears 12/25/2014   Has hearing aids/tubes    Heart murmur    as child   HNP (herniated nucleus pulposus)  Ingrown toenail of right foot 2023   Interstitial cystitis    Kidney cysts    followed by Dr. Cam   Osteopenia    Plantar fasciitis    Spinal stenosis    Tingling    rt foot   Wears hearing aid    Patient Active Problem List   Diagnosis Date Noted   Other specified disorders of eustachian tube, bilateral 12/15/2023   S/P cervical spinal fusion 08/28/2023   Central perforation of tympanic membrane of left ear 12/14/2022   Sensorineural hearing loss, bilateral 12/14/2022   Chronic rhinitis 12/14/2022   Postnasal drip 12/14/2022    Hemorrhoids 04/10/2021   Osteopenia 02/25/2021   Lumbar back pain with radiculopathy affecting right lower extremity 07/10/2020   Aortic atherosclerosis 07/07/2019   Hyperlipidemia    Hypertension    History of prediabetes    Vitamin D  deficiency    GERD (gastroesophageal reflux disease) 11/17/2011   Interstitial cystitis    Past Surgical History:  Procedure Laterality Date   ABDOMINAL HYSTERECTOMY  2003   BSO-fibroids   ANAL SPHINCTEROTOMY     CARPAL TUNNEL RELEASE     LEFT   CARPAL TUNNEL RELEASE Right 2025   and trigger finger   CERVICAL FUSION  02/25/2018   c1-c2 with laminectomy   CESAREAN SECTION     CYSTOSCOPY     HEMORRHOID SURGERY N/A 1998   KNEE ARTHROSCOPY Right 06/22/2019   Dr. Kay    LUMBAR LAMINECTOMY     LUMBAR LAMINECTOMY/DECOMPRESSION MICRODISCECTOMY N/A 08/09/2014   Procedure: MICRO LUMBAR DECOMPRESSION L3-4,  REDO DECOMPRESSION, MICRODISCECTOMY L4-5, FORAMINOTOMY L3-L4, L4-L5 BILATERAL;  Surgeon: Reyes Billing, MD;  Location: [facility];  Service: Orthopedics;  Laterality: N/A;   MYRINGOTOMY     NASAL SINUS SURGERY     POSTERIOR CERVICAL FUSION/FORAMINOTOMY N/A 08/28/2023   Procedure: CERVICAL FOUR-FIVE POSTERIOR CERVICAL FUSION;  Surgeon: Joshua Alm Hamilton, MD;  Location: [facility] OR;  Service: Neurosurgery;  Laterality: N/A;  Posterior cervical fusion with lateral mass fixation - C4-C5, laminectomy C4-5   SPINE SURGERY     TONSILLECTOMY     LEFT only, due to Eagle Syndrome   TONSILLECTOMY  11/2012   right side due to Eagles syndrome   TRIGGER FINGER RELEASE     RIGHT   TYMPANOSTOMY TUBE PLACEMENT      Family History  Problem Relation Age of Onset   Diabetes Mother    Hypertension Mother    Hyperlipidemia Mother    Osteoporosis Mother    Arthritis Mother    Hearing loss Mother    Kidney disease Mother    Hypertension Brother    Hyperlipidemia Brother    Prostate cancer Father        Bone   Cancer Father    Alcohol abuse Paternal Uncle    Alcohol  abuse Paternal Uncle     Medications- reviewed and updated Current Outpatient Medications  Medication Sig Dispense Refill   Ascorbic Acid  (VITAMIN C ) 100 MG tablet Take 1,000 mg by mouth daily.     Biotin  1 MG CAPS Take 1 mg by mouth daily.     BISACODYL  5 MG EC tablet Take by mouth as directed.     calcium  carbonate (OS-CAL - DOSED IN MG OF ELEMENTAL CALCIUM ) 1250 (500 Ca) MG tablet Take 2 tablets by mouth daily with breakfast.     Cholecalciferol  (VITAMIN D ) 50 MCG (2000 UT) CAPS Take 2,000 Units by mouth every evening.     estradiol  (VIVELLE -DOT) 0.025 MG/24HR  Place 1 patch onto the skin 2 (two) times a week. 24 patch 4   GEMTESA 75 MG TABS Take 75 mg by mouth daily.     hydrocortisone  (ANUSOL -HC) 2.5 % rectal cream Place 1 Application rectally 2 (two) times daily. 30 g 0   ipratropium (ATROVENT ) 0.06 % nasal spray Place 2 sprays into both nostrils 4 (four) times daily as needed for rhinitis. 15 mL 3   L-Arginine  1000 MG TABS Take 1,000 mg by mouth daily.     meloxicam  (MOBIC ) 15 MG tablet Take  1/2 to 1 tablet  Daily  with Food  for Pain & Inflammation 90 tablet 3   methocarbamol  (ROBAXIN ) 500 MG tablet Take 1 tablet (500 mg total) by mouth every 6 (six) hours as needed for muscle spasms. 30 tablet 0   omeprazole  (PRILOSEC) 20 MG capsule Take 1 capsule (20 mg total) by mouth daily. 90 capsule 3   Probiotic Product (PROBIOTIC PO) Take 1 tablet by mouth daily.     Pyridoxine HCl (B-6 PO) Take 1 tablet by mouth daily.     No current facility-administered medications for this visit.    Allergies-reviewed and updated Allergies[1]  Social History   Social History Narrative   Right Handed   1 Cup of Hot Tea per Day   1 Coffee per Day      3 grands   Retired runner, broadcasting/film/video   Objective  Objective:  BP 116/68 (BP Location: Left Arm, Patient Position: Sitting, Cuff Size: Normal)   Pulse 74   Temp (!) 97.4 F (36.3 C) (Temporal)   Ht 4' 10.5 (1.486 m)   Wt 106 lb (48.1 kg)   [name]  10/18/2001   SpO2 98%   BMI 21.78 kg/m  Physical Exam  Gen: WDWN NAD HEENT: NCAT, conjunctiva not injected, sclera nonicteric TM WNL R, chronic hole in L, OP moist, no exudates  NECK:  supple, no thyromegaly, no nodes, no carotid bruits CARDIAC: RRR, S1S2+, no murmur. DP 2+B LUNGS: CTAB. No wheezes ABDOMEN:  BS+, soft, NTND, No HSM, no masses EXT:  no edema MSK: no gross abnormalities. MS 5/5 all 4 NEURO: A&O x3.  CN II-XII intact.  PSYCH: normal mood. Good eye contact     Assessment and Plan   Health Maintenance counseling: 1. Anticipatory guidance: Patient counseled regarding regular dental exams q6 months, eye exams,  avoiding smoking and second hand smoke, limiting alcohol to 1 beverage per day, no illicit drugs.   2. Risk factor reduction:  Advised patient of need for regular exercise and diet rich and fruits and vegetables to reduce risk of heart attack and stroke. Exercise- encouraged.  Wt Readings from Last 3 Encounters:  02/22/24 106 lb (48.1 kg)  01/21/24 104 lb 12.8 oz (47.5 kg)  01/08/24 106 lb 12.8 oz (48.4 kg)   3. Immunizations/screenings/ancillary studies Immunization History  Administered Date(s) Administered   DTaP 10/04/2010   Fluad Quad(high Dose 65+) 10/26/2018, 12/04/2019, 10/23/2021   INFLUENZA, HIGH DOSE SEASONAL PF 11/24/2016, 12/04/2019, 11/11/2020, 10/28/2021, 10/23/2022, 11/03/2023   Influenza,inj,quad, With Preservative 11/17/2016, 10/19/2018   Influenza-Unspecified 11/29/2012, 11/29/2013, 11/17/2014, 11/27/2015, 11/24/2016, 10/04/2017, 10/23/2021   Moderna Covid-19 Vaccine Bivalent Booster 6yrs & up 06/08/2021, 04/25/2022   Moderna SARS-COV2 Booster Vaccination 11/05/2021   Moderna Sars-Covid-2 Vaccination 10/30/2020   PFIZER(Purple Top)SARS-COV-2 Vaccination 03/08/2019, 03/26/2019, 11/14/2019, 05/17/2020, 10/23/2022   Pfizer Covid-19 Vaccine Bivalent Booster 55yrs & up 10/27/2022   Pfizer(Comirnaty)Fall Seasonal Vaccine 12 years and older  10/23/2022, 06/06/2023, 11/03/2023   Pneumococcal Conjugate-13  12/21/2013   Pneumococcal Polysaccharide-23 12/25/2016   Pneumococcal-Unspecified 12/04/2002   Respiratory Syncytial Virus Vaccine,Recomb Aduvanted(Arexvy) 12/03/2021   Tdap 07/20/2023   Zoster Recombinant(Shingrix) 05/22/2016, 09/17/2016, 10/16/2016   Zoster, Live 12/03/2005   There are no preventive care reminders to display for this patient.  4. Cervical cancer screening- n/a 5. Breast cancer screening-  mammogram 09/27/24 6. Colon cancer screening - 11/10/28 7. Skin cancer screening- advised regular sunscreen use. Denies worrisome, changing, or new skin lesions.  8. Birth control/STD check- post men 21. Osteoporosis screening- 03/08/24 10. Smoking associated screening - non smoker  Wellness examination  Gastroesophageal reflux disease without esophagitis  History of prediabetes -     Hemoglobin A1c  High risk medication use -     CBC with Differential/Platelet -     Comprehensive metabolic panel with GFR -     Vitamin B12  Vitamin D  deficiency -     VITAMIN D  25 Hydroxy (Vit-D Deficiency, Fractures)    Assessment and Plan Assessment & Plan Gastroesophageal reflux disease   She experiences intermittent sore throat upon waking, likely due to acid reflux. Symptoms have improved but persist. Continue omeprazole  daily. If symptoms worsen, consider increasing omeprazole  to twice daily until the gastroenterology appointment at the end of the month.  Chronic low back pain   Managed with meloxicam  as needed. She will start physical therapy and consult with a pain management specialist. Spinal fusion is under consideration if other treatments fail. Continue meloxicam  as needed.  Overactive bladder   Managed with Gemtesa. Occasional nocturia is normal for her age. Continue Gemtesa for bladder control. Monitor urinary symptoms and consult with urology if needed.  Prediabetes   Emphasis on lifestyle modifications to  manage blood sugar levels. Encourage regular exercise and a healthy diet.  Corn on left foot   Chronic corn between toes on the left foot has been present for five years. Over-the-counter treatments have been ineffective. Referred to a podiatrist for evaluation and possible removal.  General Health Maintenance   Routine health maintenance discussed, including screenings and lifestyle modifications. A bone density test is scheduled for January 20th. Mammogram and colonoscopy are up to date. Hysterectomy negates the need for Pap smears. Continue with the scheduled bone density test and maintain a regular exercise routine to support immune system and overall health.     Recommended follow up: Return in about 6 months (around 08/21/2024) for chronic follow-up.  Lab/Order associations:n/a fasting  Jenkins CHRISTELLA Carrel, MD      [1]  Allergies Allergen Reactions   Amoxicillin -Pot Clavulanate Diarrhea   Levofloxacin Diarrhea   Other     Band-aid   Codeine Hives   Sulfa Antibiotics Nausea And Vomiting   "

## 2024-02-22 NOTE — Therapy (Signed)
 " OUTPATIENT PHYSICAL THERAPY THORACOLUMBAR EVALUATION   Patient Name: Helen Glover MRN: [id number] DOB:03-Oct-1950, 74 y.o., female Today's Date: 02/23/2024  END OF SESSION:  PT End of Session - 02/23/24 1235     Visit Number 1    Date for Recertification  04/19/24    Authorization Type HTA    Progress Note Due on Visit 10    PT Start Time 1236    PT Stop Time 1321    PT Time Calculation (min) 45 min    Activity Tolerance Patient tolerated treatment well    Behavior During Therapy [facility] for tasks assessed/performed          Past Medical History:  Diagnosis Date   Acquired hammer toe of right foot 09/25/2023   Acquired trigger finger of right index finger 09/23/2023   Allergy    Anxiety    Arthritis    in neck   Bursitis of right hip    Carpal tunnel syndrome, bilateral    rt wrist   Cataract    Cervical vertebral fusion    C1-2 posterior fusion   Complete rotator cuff tear of left shoulder    Depression    Difficult intubation 2015   pt states she was told she was a difficult intubation several years ago but has had several intubations since then with no issues   Eagle's syndrome    GERD (gastroesophageal reflux disease)    Hearing loss of both ears 12/25/2014   Has hearing aids/tubes    Heart murmur    as child   HNP (herniated nucleus pulposus)    Ingrown toenail of right foot 2023   Interstitial cystitis    Kidney cysts    followed by Dr. Cam   Osteopenia    Plantar fasciitis    Spinal stenosis    Tingling    rt foot   Wears hearing aid    Past Surgical History:  Procedure Laterality Date   ABDOMINAL HYSTERECTOMY  2003   BSO-fibroids   ANAL SPHINCTEROTOMY     CARPAL TUNNEL RELEASE     LEFT   CARPAL TUNNEL RELEASE Right 2025   and trigger finger   CERVICAL FUSION  02/25/2018   c1-c2 with laminectomy   CESAREAN SECTION     CYSTOSCOPY     HEMORRHOID SURGERY N/A 1998   KNEE ARTHROSCOPY Right 06/22/2019   Dr. Kay    LUMBAR LAMINECTOMY      LUMBAR LAMINECTOMY/DECOMPRESSION MICRODISCECTOMY N/A 08/09/2014   Procedure: MICRO LUMBAR DECOMPRESSION L3-4,  REDO DECOMPRESSION, MICRODISCECTOMY L4-5, FORAMINOTOMY L3-L4, L4-L5 BILATERAL;  Surgeon: Reyes Billing, MD;  Location: [facility];  Service: Orthopedics;  Laterality: N/A;   MYRINGOTOMY     NASAL SINUS SURGERY     POSTERIOR CERVICAL FUSION/FORAMINOTOMY N/A 08/28/2023   Procedure: CERVICAL FOUR-FIVE POSTERIOR CERVICAL FUSION;  Surgeon: Joshua Alm Hamilton, MD;  Location: [facility] OR;  Service: Neurosurgery;  Laterality: N/A;  Posterior cervical fusion with lateral mass fixation - C4-C5, laminectomy C4-5   SPINE SURGERY     TONSILLECTOMY     LEFT only, due to Eagle Syndrome   TONSILLECTOMY  11/2012   right side due to [facility] syndrome   TRIGGER FINGER RELEASE     RIGHT   TYMPANOSTOMY TUBE PLACEMENT     Patient Active Problem List   Diagnosis Date Noted   Other specified disorders of eustachian tube, bilateral 12/15/2023   S/P cervical spinal fusion 08/28/2023   Central perforation of tympanic membrane of left ear 12/14/2022  Sensorineural hearing loss, bilateral 12/14/2022   Chronic rhinitis 12/14/2022   Postnasal drip 12/14/2022   Hemorrhoids 04/10/2021   Osteopenia 02/25/2021   Lumbar back pain with radiculopathy affecting right lower extremity 07/10/2020   Aortic atherosclerosis 07/07/2019   Hyperlipidemia    Hypertension    History of prediabetes    Vitamin D  deficiency    GERD (gastroesophageal reflux disease) 11/17/2011   Interstitial cystitis     PCP: Wendolyn Jenkins Jansky, MD   REFERRING PROVIDER: Joshua Alm Hamilton, MD   REFERRING DIAG: M54.16 (ICD-10-CM) - Radiculopathy, lumbar region   Rationale for Evaluation and Treatment: Rehabilitation  THERAPY DIAG:  Other low back pain  Radiculopathy, lumbar region  Cramp and spasm  Cervicalgia  Pain in left hip  ONSET DATE: Nov 2025  SUBJECTIVE:                                                                                                                                                                                            SUBJECTIVE STATEMENT: Currently in low back. Hurts to lie on it. Last spring I had really bad sciatica into L side. Got bad again before Christmas, in left low back and lying on it hurt, but waited it out and it went way. I have not been walking until today due to illness (1.5 months)  PERTINENT HISTORY:  Lumar laminectomy/decompressin L3-5,  C1/2 post fusion, C4/5 fusion July 2025  PAIN:  Are you having pain? Yes: NPRS scale: 4/10 today up to 6/10 Pain location: low back Pain description: constant ache Aggravating factors: lying on it, sitting Relieving factors: walking and lying down  PRECAUTIONS: None  RED FLAGS: None   WEIGHT BEARING RESTRICTIONS: No  FALLS:  Has patient fallen in last 6 months? No  LIVING ENVIRONMENT: Lives with: lives with their family Lives in: House/apartment Stairs: Yes: Internal: 13 steps; can reach both and External: 2 steps; can reach both  OCCUPATION: retired  PLOF: Independent  PATIENT GOALS: alleviate some back pain and sit longer  NEXT MD VISIT: none scheduled; seeing pain management PA in same practice  OBJECTIVE:  Note: Objective measures were completed at Evaluation unless otherwise noted.  DIAGNOSTIC FINDINGS:  none  PATIENT SURVEYS:  Modified Oswestry: 22 / 50 = 44.0 %   COGNITION: Overall cognitive status: Within functional limits for tasks assessed     SENSATION: WFL  MUSCLE LENGTH: Tightness in  B HS, quads, hip flexors, piriformis   POSTURE: decreased lumbar lordosis  PALPATION: Palpation: TTP at B gluteals and piriformis, lumbar and B hip ADDuctors. Increased tissue tension in R lumbar, B gluteals Spinal Mobility: UPA mobs  decreased due to muscle tightness, painful at L4/5 Left, feels good on right   LUMBAR ROM:   AROM eval  Flexion full  Extension 50%  Right lateral flexion 14  Left lateral  flexion 12  Right rotation 50%  Left rotation 50%   (Blank rows = not tested)  LOWER EXTREMITY ROM:   WNL for tasks assessed.   LOWER EXTREMITY MMT:  grossly 5/5 B; core weakness   LUMBAR SPECIAL TESTS:  Straight leg raise test: Negative and FABER test: Positive left  FUNCTIONAL TESTS:  5 times sit to stand: 8.63 sec  GAIT: WNL  TREATMENT DATE:                                                                                                                               02/23/24 See pt ed and HEP    PATIENT EDUCATION:  Education details: PT eval findings, anticipated POC, initial HEP, and TPDN rational, procedure, outcomes, potential side effects, and recommended post-treatment exercises/activity   Person educated: Patient Education method: Explanation, Demonstration, Tactile cues, Verbal cues, and Handouts Education comprehension: verbalized understanding and returned demonstration  HOME EXERCISE PROGRAM: Access Code: [email or web address] URL: [email or web address] Date: 02/23/2024 Prepared by: Mliss  Exercises - Supine Hamstring Stretch with Strap  - 2 x daily - 7 x weekly - 1 sets - 3 reps - 30-60 sec hold - Supine ITB Stretch with Strap  - 2 x daily - 7 x weekly - 1 sets - 3 reps - 30-60 sec hold - Seated Piriformis Stretch  - 2 x daily - 7 x weekly - 1 sets - 3 reps - 30-60 sec hold - Hip Adductors and Hamstring Stretch with Strap  - 2 x daily - 7 x weekly - 1 sets - 3 reps - 30-6060 seconds hold - Supine Lower Trunk Rotation  - 2 x daily - 7 x weekly - 1 sets - 5 reps - 10 sec hold - Supine Single Knee to Chest Stretch  - 2 x daily - 7 x weekly - 1 sets - 3 reps - 15 hold - Prone Hip Extension  - 1 x daily - 3 x weekly - 2 sets - 10 reps  ASSESSMENT:  CLINICAL IMPRESSION: Patient is a 74 y.o. female who was seen today for physical therapy evaluation and treatment for lumbar radiculopathy. She also reports B neck pain (upper traps) since her fusion in July.  Patient  demonstrates deficits in  lumbar and cervical ROM, core strength and LE flexibility.  Her Modified Oswestry score indicates severe disability at 44%.  Pain and deficits affect her ability to sit, drive and lie on her back. She also demonstrates L hip joint pain anteriorly and medially.  She will benefit from skilled PT to address these deficits and those listed below.  .   OBJECTIVE IMPAIRMENTS: decreased activity tolerance, decreased mobility, decreased ROM, decreased strength, hypomobility, increased muscle spasms, impaired flexibility, postural dysfunction, and pain.  ACTIVITY LIMITATIONS: carrying, lifting, bending, sitting, standing, squatting, bathing, toileting, dressing, hygiene/grooming, and locomotion level  PARTICIPATION LIMITATIONS: meal prep, cleaning, laundry, driving, shopping, and community activity  PERSONAL FACTORS: Age and 3+ comorbidities: neck fusion, lumbar surgeries, R knee scope, left hip joint pain are also affecting patient's functional outcome.   REHAB POTENTIAL: Excellent  CLINICAL DECISION MAKING: Evolving/moderate complexity  EVALUATION COMPLEXITY: Moderate   GOALS: Goals reviewed with patient? Yes  SHORT TERM GOALS: Target date: 03/22/2024   Patient will be independent with initial HEP.  Baseline:  Goal status: INITIAL  2.  Patient will be able to lie down without pain. Baseline:  Goal status: INITIAL  3.  Decreased back and hip pain by 25%. Baseline:  Goal status: INITIAL   LONG TERM GOALS: Target date: 04/19/2024   Patient will be independent with advanced/ongoing HEP to improve outcomes and carryover.  Baseline:  Goal status: INITIAL  2.  Patient will report 75% improvement in low back and hip pain with ADLs to improve QOL.  Baseline:  Goal status: INITIAL  3.  Patient will report decreased pain with driving by 49% or more.  Baseline:  Goal status: INITIAL  4.   Patient will score 16 points or lower on the Modified Oswestry  demonstrating improved functional ability.  Baseline: 22 Goal status: INITIAL  5.   Patient able to perform her HEP without restrictions from her left hip. Baseline:  Goal status: INITIAL     PLAN:  PT FREQUENCY: 2x/week  PT DURATION: 8 weeks  PLANNED INTERVENTIONS: 97110-Therapeutic exercises, 97530- Therapeutic activity, 97112- Neuromuscular re-education, 97535- Self Care, 02859- Manual therapy, [phone]- Aquatic Therapy, [phone]- Electrical stimulation (unattended), [id number]- Ultrasound, [id number]- Traction (mechanical), [id number]- Ionotophoresis 4mg /ml Dexamethasone , 79439 (1-2 muscles), 20561 (3+ muscles)- Dry Needling, Patient/Family education, Taping, Joint mobilization, Spinal mobilization, Cryotherapy, and Moist heat.  PLAN FOR NEXT SESSION: Review and progress HEP, lumbar and left hip ROM, core strengthening,  manual/DN to B lumbar, gluteals, piriformis and ADDuctors, left hip distraction (DN to B UT if order sent by MD)   Mliss Cummins, PT 02/23/2024 1:50 PM   "

## 2024-02-22 NOTE — Patient Instructions (Signed)

## 2024-02-23 ENCOUNTER — Other Ambulatory Visit: Payer: Self-pay

## 2024-02-23 ENCOUNTER — Ambulatory Visit: Payer: Self-pay | Admitting: Family Medicine

## 2024-02-23 ENCOUNTER — Ambulatory Visit: Attending: Neurological Surgery | Admitting: Physical Therapy

## 2024-02-23 ENCOUNTER — Encounter: Payer: Self-pay | Admitting: Physical Therapy

## 2024-02-23 DIAGNOSIS — R252 Cramp and spasm: Secondary | ICD-10-CM

## 2024-02-23 DIAGNOSIS — M25552 Pain in left hip: Secondary | ICD-10-CM

## 2024-02-23 DIAGNOSIS — M5416 Radiculopathy, lumbar region: Secondary | ICD-10-CM | POA: Insufficient documentation

## 2024-02-23 DIAGNOSIS — M5459 Other low back pain: Secondary | ICD-10-CM

## 2024-02-23 DIAGNOSIS — Z79890 Hormone replacement therapy (postmenopausal): Secondary | ICD-10-CM

## 2024-02-23 DIAGNOSIS — M542 Cervicalgia: Secondary | ICD-10-CM | POA: Diagnosis not present

## 2024-02-23 LAB — VITAMIN B12: Vitamin B-12: >1500 pg/mL — ABNORMAL HIGH (ref 211–911)

## 2024-02-23 LAB — VITAMIN D 25 HYDROXY (VIT D DEFICIENCY, FRACTURES): VITD: 62.88 ng/mL (ref 30.00–100.00)

## 2024-02-23 NOTE — Progress Notes (Signed)
 Labs great If taking B12-can stop or decrease Can decrease vitamin D  dose by 1000iu/day

## 2024-02-28 NOTE — Therapy (Signed)
 " OUTPATIENT PHYSICAL THERAPY THORACOLUMBAR TREATMENT   Patient Name: SIENNAH BARRASSO MRN: [id number] DOB:1950-03-19, 74 y.o., female Today's Date: 02/29/2024  END OF SESSION:  PT End of Session - 02/29/24 1104     Visit Number 2    Date for Recertification  04/19/24    Authorization Type HTA    Progress Note Due on Visit 10    PT Start Time 1102    PT Stop Time 1147    PT Time Calculation (min) 45 min    Activity Tolerance Patient tolerated treatment well    Behavior During Therapy [facility] for tasks assessed/performed           Past Medical History:  Diagnosis Date   Acquired hammer toe of right foot 09/25/2023   Acquired trigger finger of right index finger 09/23/2023   Allergy    Anxiety    Arthritis    in neck   Bursitis of right hip    Carpal tunnel syndrome, bilateral    rt wrist   Cataract    Cervical vertebral fusion    C1-2 posterior fusion   Complete rotator cuff tear of left shoulder    Depression    Difficult intubation 2015   pt states she was told she was a difficult intubation several years ago but has had several intubations since then with no issues   Eagle's syndrome    GERD (gastroesophageal reflux disease)    Hearing loss of both ears 12/25/2014   Has hearing aids/tubes    Heart murmur    as child   HNP (herniated nucleus pulposus)    Ingrown toenail of right foot 2023   Interstitial cystitis    Kidney cysts    followed by Dr. Cam   Osteopenia    Plantar fasciitis    Spinal stenosis    Tingling    rt foot   Wears hearing aid    Past Surgical History:  Procedure Laterality Date   ABDOMINAL HYSTERECTOMY  2003   BSO-fibroids   ANAL SPHINCTEROTOMY     CARPAL TUNNEL RELEASE     LEFT   CARPAL TUNNEL RELEASE Right 2025   and trigger finger   CERVICAL FUSION  02/25/2018   c1-c2 with laminectomy   CESAREAN SECTION     CYSTOSCOPY     HEMORRHOID SURGERY N/A 1998   KNEE ARTHROSCOPY Right 06/22/2019   Dr. Kay    LUMBAR LAMINECTOMY      LUMBAR LAMINECTOMY/DECOMPRESSION MICRODISCECTOMY N/A 08/09/2014   Procedure: MICRO LUMBAR DECOMPRESSION L3-4,  REDO DECOMPRESSION, MICRODISCECTOMY L4-5, FORAMINOTOMY L3-L4, L4-L5 BILATERAL;  Surgeon: Reyes Billing, MD;  Location: [facility];  Service: Orthopedics;  Laterality: N/A;   MYRINGOTOMY     NASAL SINUS SURGERY     POSTERIOR CERVICAL FUSION/FORAMINOTOMY N/A 08/28/2023   Procedure: CERVICAL FOUR-FIVE POSTERIOR CERVICAL FUSION;  Surgeon: Joshua Alm Hamilton, MD;  Location: [facility] OR;  Service: Neurosurgery;  Laterality: N/A;  Posterior cervical fusion with lateral mass fixation - C4-C5, laminectomy C4-5   SPINE SURGERY     TONSILLECTOMY     LEFT only, due to Eagle Syndrome   TONSILLECTOMY  11/2012   right side due to Eagles syndrome   TRIGGER FINGER RELEASE     RIGHT   TYMPANOSTOMY TUBE PLACEMENT     Patient Active Problem List   Diagnosis Date Noted   Other specified disorders of eustachian tube, bilateral 12/15/2023   S/P cervical spinal fusion 08/28/2023   Central perforation of tympanic membrane of left ear  12/14/2022   Sensorineural hearing loss, bilateral 12/14/2022   Chronic rhinitis 12/14/2022   Postnasal drip 12/14/2022   Hemorrhoids 04/10/2021   Osteopenia 02/25/2021   Lumbar back pain with radiculopathy affecting right lower extremity 07/10/2020   Aortic atherosclerosis 07/07/2019   Hyperlipidemia    Hypertension    History of prediabetes    Vitamin D  deficiency    GERD (gastroesophageal reflux disease) 11/17/2011   Interstitial cystitis     PCP: Wendolyn Jenkins Jansky, MD   REFERRING PROVIDER: Joshua Alm Hamilton, MD   REFERRING DIAG: M54.16 (ICD-10-CM) - Radiculopathy, lumbar region   Rationale for Evaluation and Treatment: Rehabilitation  THERAPY DIAG:  Other low back pain  Radiculopathy, lumbar region  Cramp and spasm  Cervicalgia  Pain in left hip  ONSET DATE: Nov 2025  SUBJECTIVE:                                                                                                                                                                                            SUBJECTIVE STATEMENT: I've been doing the stretches once per day.   Eval:Currently in low back. Hurts to lie on it. Last spring I had really bad sciatica into L side. Got bad again before Christmas, in left low back and lying on it hurt, but waited it out and it went way. I have not been walking until today due to illness (1.5 months)  PERTINENT HISTORY:  Lumar laminectomy/decompressin L3-5,  C1/2 post fusion, C4/5 fusion July 2025  PAIN:  Are you having pain? Yes: NPRS scale: 2/10 today Pain location: low back Pain description: constant ache Aggravating factors: lying on it, sitting Relieving factors: walking and lying down  PRECAUTIONS: None  RED FLAGS: None   WEIGHT BEARING RESTRICTIONS: No  FALLS:  Has patient fallen in last 6 months? No  LIVING ENVIRONMENT: Lives with: lives with their family Lives in: House/apartment Stairs: Yes: Internal: 13 steps; can reach both and External: 2 steps; can reach both  OCCUPATION: retired  PLOF: Independent  PATIENT GOALS: alleviate some back pain and sit longer  NEXT MD VISIT: none scheduled; seeing pain management PA in same practice  OBJECTIVE:  Note: Objective measures were completed at Evaluation unless otherwise noted.  DIAGNOSTIC FINDINGS:  none  PATIENT SURVEYS:  Modified Oswestry: 22 / 50 = 44.0 %   COGNITION: Overall cognitive status: Within functional limits for tasks assessed     SENSATION: WFL  MUSCLE LENGTH: Tightness in  B HS, quads, hip flexors, piriformis   POSTURE: decreased lumbar lordosis  PALPATION: Palpation: TTP at B gluteals and piriformis, lumbar and B hip ADDuctors. Increased tissue  tension in R lumbar, B gluteals Spinal Mobility: UPA mobs decreased due to muscle tightness, painful at L4/5 Left, feels good on right   LUMBAR ROM:   AROM eval  Flexion full  Extension  50%  Right lateral flexion 14  Left lateral flexion 12  Right rotation 50%  Left rotation 50%   (Blank rows = not tested)  LOWER EXTREMITY ROM:   WNL for tasks assessed.   LOWER EXTREMITY MMT:  grossly 5/5 B; core weakness   LUMBAR SPECIAL TESTS:  Straight leg raise test: Negative and FABER test: Positive left  FUNCTIONAL TESTS:  5 times sit to stand: 8.63 sec  GAIT: WNL  TREATMENT DATE:                                                                                                                               02/29/24 HS with strap and opp knee bent x 30 sec; modified to sciatic nerve tensioner x 10 with 5 sec hold ADDuctor LTR SKTC no problem Prone hip ext x 10 B Trigger Point Dry Needling  Initial Treatment: Pt instructed on Dry Needling rational, procedures, and possible side effects. Pt instructed to expect mild to moderate muscle soreness later in the day and/or into the next day.  Pt instructed in methods to reduce muscle soreness. Pt instructed to continue prescribed HEP. Because Dry Needling was performed over or adjacent to a lung field, pt was educated on S/S of pneumothorax and to seek immediate medical attention should they occur.  Patient was educated on signs and symptoms of infection and other risk factors and advised to seek medical attention should they occur.  Patient verbalized understanding of these instructions and education.   Patient Verbal Consent Given: Yes Education Handout Provided: Yes Muscles Treated: B UT, gluteals min/med/max Electrical Stimulation Performed: No Treatment Response/Outcome: Utilized skilled palpation to identify bony landmarks and trigger points.  Able to illicit twitch response and muscle elongation.  Soft tissue mobilization to muscles needled to further promote tissue elongation and decreased pain.         02/23/24 See pt ed and HEP    PATIENT EDUCATION:  Education details: PT eval findings, anticipated POC, initial  HEP, and TPDN rational, procedure, outcomes, potential side effects, and recommended post-treatment exercises/activity   Person educated: Patient Education method: Explanation, Demonstration, Tactile cues, Verbal cues, and Handouts Education comprehension: verbalized understanding and returned demonstration  HOME EXERCISE PROGRAM: Access Code: [email or web address] URL: [email or web address] Date: 02/23/2024 Prepared by: Mliss  Exercises - Supine Hamstring Stretch with Strap  - 2 x daily - 7 x weekly - 1 sets - 3 reps - 30-60 sec hold - Supine ITB Stretch with Strap  - 2 x daily - 7 x weekly - 1 sets - 3 reps - 30-60 sec hold - Seated Piriformis Stretch  - 2 x daily - 7 x weekly - 1 sets - 3 reps - 30-60 sec hold - Hip Adductors  and Hamstring Stretch with Strap  - 2 x daily - 7 x weekly - 1 sets - 3 reps - 30-6060 seconds hold - Supine Lower Trunk Rotation  - 2 x daily - 7 x weekly - 1 sets - 5 reps - 10 sec hold - Supine Single Knee to Chest Stretch  - 2 x daily - 7 x weekly - 1 sets - 3 reps - 15 hold - Prone Hip Extension  - 1 x daily - 3 x weekly - 2 sets - 10 reps  ASSESSMENT:  CLINICAL IMPRESSION: Patient returns for first f/u visit and reports her back is feeling a little better since starting the stretches. She has some burning with HS stretch which occasionally resolves after holding for a period of time. We tried sciatic nerve tensioner as alternative and patient will assess at home which is better. She wanted to proceed with DN today. An order was received for her neck, so this was addressed as well. She had excellent twitch responses in B gluteals and upper traps. She reported less tension in her neck at the end of session.    Eval: Patient is a 74 y.o. female who was seen today for physical therapy evaluation and treatment for lumbar radiculopathy. She also reports B neck pain (upper traps) since her fusion in July.  Patient demonstrates deficits in  lumbar and cervical ROM,  core strength and LE flexibility.  Her Modified Oswestry score indicates severe disability at 44%.  Pain and deficits affect her ability to sit, drive and lie on her back. She also demonstrates L hip joint pain anteriorly and medially.  She will benefit from skilled PT to address these deficits and those listed below.  .   OBJECTIVE IMPAIRMENTS: decreased activity tolerance, decreased mobility, decreased ROM, decreased strength, hypomobility, increased muscle spasms, impaired flexibility, postural dysfunction, and pain.   ACTIVITY LIMITATIONS: carrying, lifting, bending, sitting, standing, squatting, bathing, toileting, dressing, hygiene/grooming, and locomotion level  PARTICIPATION LIMITATIONS: meal prep, cleaning, laundry, driving, shopping, and community activity  PERSONAL FACTORS: Age and 3+ comorbidities: neck fusion, lumbar surgeries, R knee scope, left hip joint pain are also affecting patient's functional outcome.   REHAB POTENTIAL: Excellent  CLINICAL DECISION MAKING: Evolving/moderate complexity  EVALUATION COMPLEXITY: Moderate   GOALS: Goals reviewed with patient? Yes  SHORT TERM GOALS: Target date: 03/22/2024   Patient will be independent with initial HEP.  Baseline:  Goal status: INITIAL  2.  Patient will be able to lie down without pain. Baseline:  Goal status: INITIAL  3.  Decreased back and hip pain by 25%. Baseline:  Goal status: INITIAL   LONG TERM GOALS: Target date: 04/19/2024   Patient will be independent with advanced/ongoing HEP to improve outcomes and carryover.  Baseline:  Goal status: INITIAL  2.  Patient will report 75% improvement in low back and hip pain with ADLs to improve QOL.  Baseline:  Goal status: INITIAL  3.  Patient will report decreased pain with driving by 49% or more.  Baseline:  Goal status: INITIAL  4.   Patient will score 16 points or lower on the Modified Oswestry demonstrating improved functional ability.  Baseline: 22 Goal  status: INITIAL  5.   Patient able to perform her HEP without restrictions from her left hip. Baseline:  Goal status: INITIAL     PLAN:  PT FREQUENCY: 2x/week  PT DURATION: 8 weeks  PLANNED INTERVENTIONS: 97110-Therapeutic exercises, 97530- Therapeutic activity, [id number]- Neuromuscular re-education, 97535- Self Care, 02859- Manual therapy,  02886- Aquatic Therapy, [phone]- Electrical stimulation (unattended), [id number]- Ultrasound, [id number]- Traction (mechanical), [id number]- Ionotophoresis 4mg /ml Dexamethasone , 20560 (1-2 muscles), 20561 (3+ muscles)- Dry Needling, Patient/Family education, Taping, Joint mobilization, Spinal mobilization, Cryotherapy, and Moist heat.  PLAN FOR NEXT SESSION: Assess response to DN and sciatic nerve tensioner. Review and progress HEP, lumbar and left hip ROM,  possible left hip distraction, core strengthening,  continue prn: manual/DN to B lumbar, gluteals, piriformis and ADDuctors and UT   Mliss Cummins, PT 02/29/2024 12:49 PM   "

## 2024-02-29 ENCOUNTER — Telehealth (INDEPENDENT_AMBULATORY_CARE_PROVIDER_SITE_OTHER): Payer: Self-pay | Admitting: Otolaryngology

## 2024-02-29 ENCOUNTER — Ambulatory Visit: Admitting: Physical Therapy

## 2024-02-29 ENCOUNTER — Encounter: Payer: Self-pay | Admitting: Physical Therapy

## 2024-02-29 DIAGNOSIS — M542 Cervicalgia: Secondary | ICD-10-CM

## 2024-02-29 DIAGNOSIS — M5416 Radiculopathy, lumbar region: Secondary | ICD-10-CM | POA: Diagnosis not present

## 2024-02-29 DIAGNOSIS — M25552 Pain in left hip: Secondary | ICD-10-CM

## 2024-02-29 DIAGNOSIS — R252 Cramp and spasm: Secondary | ICD-10-CM

## 2024-02-29 DIAGNOSIS — M5459 Other low back pain: Secondary | ICD-10-CM

## 2024-02-29 NOTE — Telephone Encounter (Signed)
 The patient called in requesting an appointment with Dr Karis to see her for sinusitis.  I left a message on both her mobile [phone]) and home [phone])  that we can get her on the schedule and asked for her to call us  back.  She is an existing patient with Dr Karis, this would be a new issue.

## 2024-03-03 ENCOUNTER — Ambulatory Visit: Admitting: Physical Therapy

## 2024-03-03 ENCOUNTER — Ambulatory Visit (INDEPENDENT_AMBULATORY_CARE_PROVIDER_SITE_OTHER): Admitting: Otolaryngology

## 2024-03-03 ENCOUNTER — Encounter: Payer: Self-pay | Admitting: Physical Therapy

## 2024-03-03 VITALS — BP 119/70 | HR 72 | Ht <= 58 in | Wt 107.0 lb

## 2024-03-03 DIAGNOSIS — H903 Sensorineural hearing loss, bilateral: Secondary | ICD-10-CM

## 2024-03-03 DIAGNOSIS — R0982 Postnasal drip: Secondary | ICD-10-CM

## 2024-03-03 DIAGNOSIS — J0101 Acute recurrent maxillary sinusitis: Secondary | ICD-10-CM | POA: Insufficient documentation

## 2024-03-03 DIAGNOSIS — M25552 Pain in left hip: Secondary | ICD-10-CM

## 2024-03-03 DIAGNOSIS — M5416 Radiculopathy, lumbar region: Secondary | ICD-10-CM

## 2024-03-03 DIAGNOSIS — H7202 Central perforation of tympanic membrane, left ear: Secondary | ICD-10-CM | POA: Diagnosis not present

## 2024-03-03 DIAGNOSIS — J310 Chronic rhinitis: Secondary | ICD-10-CM

## 2024-03-03 DIAGNOSIS — M5459 Other low back pain: Secondary | ICD-10-CM

## 2024-03-03 DIAGNOSIS — R252 Cramp and spasm: Secondary | ICD-10-CM

## 2024-03-03 DIAGNOSIS — H6983 Other specified disorders of Eustachian tube, bilateral: Secondary | ICD-10-CM | POA: Diagnosis not present

## 2024-03-03 NOTE — Progress Notes (Signed)
 Patient ID: Helen Glover, female   DOB: 05/23/1950, 74 y.o.   MRN: [id number]  Follow up: Left tympanic membrane perforation, eustachian tube dysfunction, hearing loss New complaint: Acute sinusitis, nasal drainage  History of Present Illness Helen Glover is a 74 year old female with eustachian tube dysfunction and left tympanic membrane perforation who presents with new complaint of recent acute sinusitis.  From November 11th to December 11th, she experienced a prolonged episode of sinusitis with green nasal discharge, congestive cough, and significant facial pain and pressure, predominantly on the right. Symptoms did not fully resolve with initial treatment.  On December 26th, she developed recurrent sinus symptoms and was diagnosed with influenza on December 28th. During this period, symptoms shifted to the left side, with increased facial pressure and burning localized to the left maxillary region. She also developed a left ear infection with fluid accumulation, but denied otorrhea.  She received three courses of antibiotics: the first was ineffective, the second (azithromycin ) provided partial improvement with persistent yellow drainage, and the third was for the left ear infection. She completed a three-day course of prednisone  prior to antibiotics.  Her congestive cough persisted until the Monday prior to this visit but has since resolved. Currently, she reports persistent facial pressure and burning in the left sinus, nasal obstruction, and absence of rhinorrhea on the left. She continues to use ipratropium nasal spray once daily for right-sided rhinorrhea, but avoids steroid nasal sprays due to sore throat.  Exam: General: Communicates without difficulty, well nourished, no acute distress. Head: Normocephalic, no evidence injury, no tenderness, facial buttresses intact without stepoff. Face/sinus: No tenderness to palpation and percussion. Facial movement is normal and symmetric.  Eyes: PERRL, EOMI. No scleral icterus, conjunctivae clear. Neuro: CN II exam reveals vision grossly intact.  No nystagmus at any point of gaze. Ears: Auricles well formed without lesions.  Ear canals are intact without mass or lesion.  No erythema or edema is appreciated.  A persistent left TM perforation is noted.  Nose: External evaluation reveals normal support and skin without lesions.  Dorsum is intact.  Anterior rhinoscopy reveals congested mucosa over anterior aspect of inferior turbinates and intact septum.  No purulence noted. Oral:  Oral cavity and oropharynx are intact, symmetric, without erythema or edema.  Mucosa is moist without lesions. Neck: Full range of motion without pain.  There is no significant lymphadenopathy.  No masses palpable.  Thyroid  bed within normal limits to palpation.  Parotid glands and submandibular glands equal bilaterally without mass.  Trachea is midline. Neuro:  CN 2-12 grossly intact.    Procedure:  Flexible Nasal Endoscopy: Description: Risks, benefits, and alternatives of flexible endoscopy were explained to the patient.  Specific mention was made of the risk of throat numbness with difficulty swallowing, possible bleeding from the nose and mouth, and pain from the procedure.  The patient gave oral consent to proceed.  The flexible scope was inserted into the right nasal cavity.  Endoscopy of the interior nasal cavity, superior, inferior, and middle meatus was performed. The sphenoid-ethmoid recess was examined. Edematous mucosa was noted.  No polyp, mass, or lesion was appreciated. Olfactory cleft was clear.  Nasopharynx was clear.  Turbinates were hypertrophied but without mass.  The procedure was repeated on the contralateral side with similar findings.  The patient tolerated the procedure well.   Assessment & Plan Acute maxillary sinusitis She has recurrent maxillary sinusitis with recent episodes now resolved following multiple courses of antibiotics and a short  course of oral corticosteroids. Examination revealed persistent mucosal swelling without active infection or purulent drainage. Residual inflammation is likely causing ongoing facial pressure and burning.  Short-term, low-dose corticosteroids are recommended given her intolerance to higher doses. - Prescribed a six-day, half-dose prednisone  taper to reduce mucosal swelling. - Advised against further antibiotics due to lack of evidence for ongoing infection.  Chronic rhinitis with nasal mucosal congestion She has persistent nasal congestion and mucosal swelling, predominantly on the left, with intolerance to standard fluticasone propionate (Flonase) due to pharyngitis. Examination confirmed ongoing congestion. Flonase Sensimist is recommended for improved tolerability. Continued ipratropium nasal spray is appropriate for right-sided rhinorrhea. - Recommended switching to Flonase Sensimist (mist formulation) for the left nasal passage to minimize throat irritation and improve tolerability. - Advised continued use of ipratropium nasal spray for right-sided rhinorrhea as needed.  Eustachian tube dysfunction and left TM perforation - The patient is reassured and no acute ear infection is noted today. - Sensimist nasal spray 2 sprays each nostril daily. - Continue dry ear precautions on the left side.

## 2024-03-03 NOTE — Therapy (Signed)
 " OUTPATIENT PHYSICAL THERAPY THORACOLUMBAR TREATMENT   Patient Name: Helen Glover MRN: [id number] DOB:Jul 05, 1950, 74 y.o., female Today's Date: 03/03/2024  END OF SESSION:  PT End of Session - 03/03/24 0843     Visit Number 3    Date for Recertification  04/19/24    Authorization Type HTA    Progress Note Due on Visit 10    PT Start Time [phone]    PT Stop Time 0930    PT Time Calculation (min) 47 min    Activity Tolerance Patient tolerated treatment well    Behavior During Therapy [facility] for tasks assessed/performed            Past Medical History:  Diagnosis Date   Acquired hammer toe of right foot 09/25/2023   Acquired trigger finger of right index finger 09/23/2023   Allergy    Anxiety    Arthritis    in neck   Bursitis of right hip    Carpal tunnel syndrome, bilateral    rt wrist   Cataract    Cervical vertebral fusion    C1-2 posterior fusion   Complete rotator cuff tear of left shoulder    Depression    Difficult intubation 2015   pt states she was told she was a difficult intubation several years ago but has had several intubations since then with no issues   Eagle's syndrome    GERD (gastroesophageal reflux disease)    Hearing loss of both ears 12/25/2014   Has hearing aids/tubes    Heart murmur    as child   HNP (herniated nucleus pulposus)    Ingrown toenail of right foot 2023   Interstitial cystitis    Kidney cysts    followed by Dr. Cam   Osteopenia    Plantar fasciitis    Spinal stenosis    Tingling    rt foot   Wears hearing aid    Past Surgical History:  Procedure Laterality Date   ABDOMINAL HYSTERECTOMY  2003   BSO-fibroids   ANAL SPHINCTEROTOMY     CARPAL TUNNEL RELEASE     LEFT   CARPAL TUNNEL RELEASE Right 2025   and trigger finger   CERVICAL FUSION  02/25/2018   c1-c2 with laminectomy   CESAREAN SECTION     CYSTOSCOPY     HEMORRHOID SURGERY N/A 1998   KNEE ARTHROSCOPY Right 06/22/2019   Dr. Kay    LUMBAR  LAMINECTOMY     LUMBAR LAMINECTOMY/DECOMPRESSION MICRODISCECTOMY N/A 08/09/2014   Procedure: MICRO LUMBAR DECOMPRESSION L3-4,  REDO DECOMPRESSION, MICRODISCECTOMY L4-5, FORAMINOTOMY L3-L4, L4-L5 BILATERAL;  Surgeon: Reyes Billing, MD;  Location: [facility];  Service: Orthopedics;  Laterality: N/A;   MYRINGOTOMY     NASAL SINUS SURGERY     POSTERIOR CERVICAL FUSION/FORAMINOTOMY N/A 08/28/2023   Procedure: CERVICAL FOUR-FIVE POSTERIOR CERVICAL FUSION;  Surgeon: Joshua Alm Hamilton, MD;  Location: [facility] OR;  Service: Neurosurgery;  Laterality: N/A;  Posterior cervical fusion with lateral mass fixation - C4-C5, laminectomy C4-5   SPINE SURGERY     TONSILLECTOMY     LEFT only, due to Eagle Syndrome   TONSILLECTOMY  11/2012   right side due to Eagles syndrome   TRIGGER FINGER RELEASE     RIGHT   TYMPANOSTOMY TUBE PLACEMENT     Patient Active Problem List   Diagnosis Date Noted   Other specified disorders of eustachian tube, bilateral 12/15/2023   S/P cervical spinal fusion 08/28/2023   Central perforation of tympanic membrane of left  ear 12/14/2022   Sensorineural hearing loss, bilateral 12/14/2022   Chronic rhinitis 12/14/2022   Postnasal drip 12/14/2022   Hemorrhoids 04/10/2021   Osteopenia 02/25/2021   Lumbar back pain with radiculopathy affecting right lower extremity 07/10/2020   Aortic atherosclerosis 07/07/2019   Hyperlipidemia    Hypertension    History of prediabetes    Vitamin D  deficiency    GERD (gastroesophageal reflux disease) 11/17/2011   Interstitial cystitis     PCP: Wendolyn Jenkins Jansky, MD   REFERRING PROVIDER: Joshua Alm Hamilton, MD   REFERRING DIAG: M54.16 (ICD-10-CM) - Radiculopathy, lumbar region   Rationale for Evaluation and Treatment: Rehabilitation  THERAPY DIAG:  Other low back pain  Radiculopathy, lumbar region  Cramp and spasm  Pain in left hip  ONSET DATE: Nov 2025  SUBJECTIVE:                                                                                                                                                                                            SUBJECTIVE STATEMENT: I think the DN helped.  I don't notice much difference between the tensioner and the stretch.  I have burning in the back.  I need to try my stretches in the bed vs the floor. My neck is bothering me from the fusion.   Eval:Currently in low back. Hurts to lie on it. Last spring I had really bad sciatica into L side. Got bad again before Christmas, in left low back and lying on it hurt, but waited it out and it went way. I have not been walking until today due to illness (1.5 months)  PERTINENT HISTORY:  Lumar laminectomy/decompressin L3-5,  C1/2 post fusion, C4/5 fusion July 2025  PAIN:  Are you having pain? Yes: NPRS scale: 2/10 today Pain location: low back Pain description: constant ache Aggravating factors: lying on it, sitting Relieving factors: walking and lying down  PRECAUTIONS: None  RED FLAGS: None   WEIGHT BEARING RESTRICTIONS: No  FALLS:  Has patient fallen in last 6 months? No  LIVING ENVIRONMENT: Lives with: lives with their family Lives in: House/apartment Stairs: Yes: Internal: 13 steps; can reach both and External: 2 steps; can reach both  OCCUPATION: retired  PLOF: Independent  PATIENT GOALS: alleviate some back pain and sit longer  NEXT MD VISIT: none scheduled; seeing pain management PA in same practice  OBJECTIVE:  Note: Objective measures were completed at Evaluation unless otherwise noted.  DIAGNOSTIC FINDINGS:  none  PATIENT SURVEYS:  Modified Oswestry: 22 / 50 = 44.0 %   COGNITION: Overall cognitive status: Within functional limits for tasks assessed  SENSATION: WFL  MUSCLE LENGTH: Tightness in  B HS, quads, hip flexors, piriformis   POSTURE: decreased lumbar lordosis  PALPATION: Palpation: TTP at B gluteals and piriformis, lumbar and B hip ADDuctors. Increased tissue tension in R lumbar, B  gluteals Spinal Mobility: UPA mobs decreased due to muscle tightness, painful at L4/5 Left, feels good on right   LUMBAR ROM:   AROM eval  Flexion full  Extension 50%  Right lateral flexion 14  Left lateral flexion 12  Right rotation 50%  Left rotation 50%   (Blank rows = not tested)  LOWER EXTREMITY ROM:   WNL for tasks assessed.   LOWER EXTREMITY MMT:  grossly 5/5 B; core weakness   LUMBAR SPECIAL TESTS:  Straight leg raise test: Negative and FABER test: Positive left  FUNCTIONAL TESTS:  5 times sit to stand: 8.63 sec  GAIT: WNL  TREATMENT DATE:                                                                                                                               03/03/24 DKTC and SKTC trial - Pt stated no change in lumbar burning Supine bridge 8x5 - feels good on lumbar spine but tight hamstrings Supine sciatic flossing x10 each LE using strap behind thigh  Prone lying x 2' with TA indraw cues intermittently - no improvement in lumbar burning Standing bil yellow tband pullbacks x10 to highlight core, added mini squat to stand with pullbacks x10 Standing bil shoulder flexion yellow tband x10 for core and deep multifdi activation Step up 6 with march to 3rd step light UE support on bil rails - felt great on Rt LE but pain with Lt LE Lateral step up 4 x10 light UE support each LE - no pain Hip flexor stretch standing with foot on 2nd step 2x30 each LE - feels good Sit to stand from chair with ball between knees x10 - (ball helped Lt hip pain with initial reps w/o ball) Standing T at counter x8 each side Reviewed options for HS stretch - sitting and standing due to pain with this in supine - preferred standing so she could stretch without bending trunk forward (updated HEP)  02/29/24 HS with strap and opp knee bent x 30 sec; modified to sciatic nerve tensioner x 10 with 5 sec hold ADDuctor LTR SKTC no problem Prone hip ext x 10 B Trigger Point Dry  Needling  Initial Treatment: Pt instructed on Dry Needling rational, procedures, and possible side effects. Pt instructed to expect mild to moderate muscle soreness later in the day and/or into the next day.  Pt instructed in methods to reduce muscle soreness. Pt instructed to continue prescribed HEP. Because Dry Needling was performed over or adjacent to a lung field, pt was educated on S/S of pneumothorax and to seek immediate medical attention should they occur.  Patient was educated on signs and symptoms of infection and other risk factors and advised  to seek medical attention should they occur.  Patient verbalized understanding of these instructions and education.   Patient Verbal Consent Given: Yes Education Handout Provided: Yes Muscles Treated: B UT, gluteals min/med/max Electrical Stimulation Performed: No Treatment Response/Outcome: Utilized skilled palpation to identify bony landmarks and trigger points.  Able to illicit twitch response and muscle elongation.  Soft tissue mobilization to muscles needled to further promote tissue elongation and decreased pain.         02/23/24 See pt ed and HEP    PATIENT EDUCATION:  Education details: PT eval findings, anticipated POC, initial HEP, and TPDN rational, procedure, outcomes, potential side effects, and recommended post-treatment exercises/activity   Person educated: Patient Education method: Explanation, Demonstration, Tactile cues, Verbal cues, and Handouts Education comprehension: verbalized understanding and returned demonstration  HOME EXERCISE PROGRAM: Access Code: [email or web address] URL: [email or web address] Date: 03/03/2024 Prepared by: Orvil Marrio Scribner  Exercises - Supine ITB Stretch with Strap  - 2 x daily - 7 x weekly - 1 sets - 3 reps - 30-60 sec hold - Seated Piriformis Stretch  - 2 x daily - 7 x weekly - 1 sets - 3 reps - 30-60 sec hold - Hip Adductors and Hamstring Stretch with Strap  - 2 x daily - 7 x  weekly - 1 sets - 3 reps - 30-6060 seconds hold - Supine Lower Trunk Rotation  - 2 x daily - 7 x weekly - 1 sets - 5 reps - 10 sec hold - Supine Single Knee to Chest Stretch  - 2 x daily - 7 x weekly - 1 sets - 3 reps - 15 hold - Prone Hip Extension  - 1 x daily - 3 x weekly - 2 sets - 10 reps - Hip Flexor Stretch with Chair  - 1 x daily - 7 x weekly - 1 sets - 2 reps - 20 hold - Standing Hamstring Stretch on Chair  - 1 x daily - 7 x weekly - 1 sets - 2 reps - 20-30 hold  ASSESSMENT:  CLINICAL IMPRESSION: Session spent focusing on assessing for positional preference and management of burning in lumbar spine.  Pt does not seem to prefer supine or prone and reports ongoing burning in both positions.  She has pain with attempts to get into piriformis stretch and is very tender along Lt SI joint.  Pt felt improvement with mobility in neutral posture and with addition of hip flexor stretch in standing and adjustment of HS stretch to standing with foot propped high enough to allow neutral posture.  She is interested in repeat of DN to Lt hip next time to see if this allows more comfort with stretching.   Eval: Patient is a 74 y.o. female who was seen today for physical therapy evaluation and treatment for lumbar radiculopathy. She also reports B neck pain (upper traps) since her fusion in July.  Patient demonstrates deficits in  lumbar and cervical ROM, core strength and LE flexibility.  Her Modified Oswestry score indicates severe disability at 44%.  Pain and deficits affect her ability to sit, drive and lie on her back. She also demonstrates L hip joint pain anteriorly and medially.  She will benefit from skilled PT to address these deficits and those listed below.  .   OBJECTIVE IMPAIRMENTS: decreased activity tolerance, decreased mobility, decreased ROM, decreased strength, hypomobility, increased muscle spasms, impaired flexibility, postural dysfunction, and pain.   ACTIVITY LIMITATIONS: carrying,  lifting, bending, sitting, standing, squatting, bathing, toileting, dressing, hygiene/grooming, and locomotion level  PARTICIPATION LIMITATIONS: meal prep, cleaning, laundry, driving, shopping, and community activity  PERSONAL FACTORS: Age and 3+ comorbidities: neck fusion, lumbar surgeries, R knee scope, left hip joint pain are also affecting patient's functional outcome.   REHAB POTENTIAL: Excellent  CLINICAL DECISION MAKING: Evolving/moderate complexity  EVALUATION COMPLEXITY: Moderate   GOALS: Goals reviewed with patient? Yes  SHORT TERM GOALS: Target date: 03/22/2024   Patient will be independent with initial HEP.  Baseline:  Goal status: INITIAL  2.  Patient will be able to lie down without pain. Baseline:  Goal status: INITIAL  3.  Decreased back and hip pain by 25%. Baseline:  Goal status: INITIAL   LONG TERM GOALS: Target date: 04/19/2024   Patient will be independent with advanced/ongoing HEP to improve outcomes and carryover.  Baseline:  Goal status: INITIAL  2.  Patient will report 75% improvement in low back and hip pain with ADLs to improve QOL.  Baseline:  Goal status: INITIAL  3.  Patient will report decreased pain with driving by 49% or more.  Baseline:  Goal status: INITIAL  4.   Patient will score 16 points or lower on the Modified Oswestry demonstrating improved functional ability.  Baseline: 22 Goal status: INITIAL  5.   Patient able to perform her HEP without restrictions from her left hip. Baseline:  Goal status: INITIAL     PLAN:  PT FREQUENCY: 2x/week  PT DURATION: 8 weeks  PLANNED INTERVENTIONS: 97110-Therapeutic exercises, 97530- Therapeutic activity, 97112- Neuromuscular re-education, 97535- Self Care, [id number]- Manual therapy, [phone]- Aquatic Therapy, [phone]- Electrical stimulation (unattended), [id number]- Ultrasound, [id number]- Traction (mechanical), [id number]- Ionotophoresis 4mg /ml Dexamethasone , [id number] (1-2 muscles), [id number] (3+ muscles)- Dry  Needling, Patient/Family education, Taping, Joint mobilization, Spinal mobilization, Cryotherapy, and Moist heat.  PLAN FOR NEXT SESSION: review standing stretches added last time, Pt seems to do better in standing for therex with neutral spine posture.  Might benefit from repeat DN (Lt hip especially) paired with Lt hip distraction. Review and progress HEP, lumbar and left hip ROM,  possible left hip distraction, core strengthening,  continue prn: manual/DN to B lumbar, gluteals, piriformis and ADDuctors and UT  Tyrrell Stephens, PT 03/03/24 9:36 AM   "

## 2024-03-06 NOTE — Therapy (Signed)
 " OUTPATIENT PHYSICAL THERAPY THORACOLUMBAR TREATMENT   Patient Name: Helen Glover MRN: [id number] DOB:June 12, 1950, 74 y.o., female Today's Date: 03/07/2024  END OF SESSION:  PT End of Session - 03/07/24 0921     Visit Number 4    Date for Recertification  04/19/24    Authorization Type HTA    Progress Note Due on Visit 10    PT Start Time 0922    PT Stop Time 1002    PT Time Calculation (min) 40 min    Activity Tolerance Patient tolerated treatment well    Behavior During Therapy [facility] for tasks assessed/performed             Past Medical History:  Diagnosis Date   Acquired hammer toe of right foot 09/25/2023   Acquired trigger finger of right index finger 09/23/2023   Allergy    Anxiety    Arthritis    in neck   Bursitis of right hip    Carpal tunnel syndrome, bilateral    rt wrist   Cataract    Cervical vertebral fusion    C1-2 posterior fusion   Complete rotator cuff tear of left shoulder    Depression    Difficult intubation 2015   pt states she was told she was a difficult intubation several years ago but has had several intubations since then with no issues   Eagle's syndrome    GERD (gastroesophageal reflux disease)    Hearing loss of both ears 12/25/2014   Has hearing aids/tubes    Heart murmur    as child   HNP (herniated nucleus pulposus)    Ingrown toenail of right foot 2023   Interstitial cystitis    Kidney cysts    followed by Dr. Cam   Osteopenia    Plantar fasciitis    Spinal stenosis    Tingling    rt foot   Wears hearing aid    Past Surgical History:  Procedure Laterality Date   ABDOMINAL HYSTERECTOMY  2003   BSO-fibroids   ANAL SPHINCTEROTOMY     CARPAL TUNNEL RELEASE     LEFT   CARPAL TUNNEL RELEASE Right 2025   and trigger finger   CERVICAL FUSION  02/25/2018   c1-c2 with laminectomy   CESAREAN SECTION     CYSTOSCOPY     HEMORRHOID SURGERY N/A 1998   KNEE ARTHROSCOPY Right 06/22/2019   Dr. Kay    LUMBAR  LAMINECTOMY     LUMBAR LAMINECTOMY/DECOMPRESSION MICRODISCECTOMY N/A 08/09/2014   Procedure: MICRO LUMBAR DECOMPRESSION L3-4,  REDO DECOMPRESSION, MICRODISCECTOMY L4-5, FORAMINOTOMY L3-L4, L4-L5 BILATERAL;  Surgeon: Reyes Billing, MD;  Location: [facility];  Service: Orthopedics;  Laterality: N/A;   MYRINGOTOMY     NASAL SINUS SURGERY     POSTERIOR CERVICAL FUSION/FORAMINOTOMY N/A 08/28/2023   Procedure: CERVICAL FOUR-FIVE POSTERIOR CERVICAL FUSION;  Surgeon: Joshua Alm Hamilton, MD;  Location: [facility] OR;  Service: Neurosurgery;  Laterality: N/A;  Posterior cervical fusion with lateral mass fixation - C4-C5, laminectomy C4-5   SPINE SURGERY     TONSILLECTOMY     LEFT only, due to Eagle Syndrome   TONSILLECTOMY  11/2012   right side due to [facility] syndrome   TRIGGER FINGER RELEASE     RIGHT   TYMPANOSTOMY TUBE PLACEMENT     Patient Active Problem List   Diagnosis Date Noted   Acute recurrent maxillary sinusitis 03/03/2024   Other specified disorders of eustachian tube, bilateral 12/15/2023   S/P cervical spinal fusion 08/28/2023  Central perforation of tympanic membrane of left ear 12/14/2022   Sensorineural hearing loss, bilateral 12/14/2022   Chronic rhinitis 12/14/2022   Postnasal drip 12/14/2022   Hemorrhoids 04/10/2021   Osteopenia 02/25/2021   Lumbar back pain with radiculopathy affecting right lower extremity 07/10/2020   Aortic atherosclerosis 07/07/2019   Hyperlipidemia    Hypertension    History of prediabetes    Vitamin D  deficiency    GERD (gastroesophageal reflux disease) 11/17/2011   Interstitial cystitis     PCP: Wendolyn Jenkins Jansky, MD   REFERRING PROVIDER: Joshua Alm Hamilton, MD   REFERRING DIAG: M54.16 (ICD-10-CM) - Radiculopathy, lumbar region   Rationale for Evaluation and Treatment: Rehabilitation  THERAPY DIAG:  Other low back pain  Radiculopathy, lumbar region  Cramp and spasm  Pain in left hip  Cervicalgia  ONSET DATE: Nov 2025  SUBJECTIVE:                                                                                                                                                                                            SUBJECTIVE STATEMENT: I feel like I'm making some progress. Injection scheduled for the 29th.    Eval:Currently in low back. Hurts to lie on it. Last spring I had really bad sciatica into L side. Got bad again before Christmas, in left low back and lying on it hurt, but waited it out and it went way. I have not been walking until today due to illness (1.5 months)  PERTINENT HISTORY:  Lumar laminectomy/decompressin L3-5,  C1/2 post fusion, C4/5 fusion July 2025  PAIN:  Are you having pain? Yes: NPRS scale: 0/10 today Pain location: low back Pain description: constant ache Aggravating factors: lying on it, sitting Relieving factors: walking and lying down  PRECAUTIONS: None  RED FLAGS: None   WEIGHT BEARING RESTRICTIONS: No  FALLS:  Has patient fallen in last 6 months? No  LIVING ENVIRONMENT: Lives with: lives with their family Lives in: House/apartment Stairs: Yes: Internal: 13 steps; can reach both and External: 2 steps; can reach both  OCCUPATION: retired  PLOF: Independent  PATIENT GOALS: alleviate some back pain and sit longer  NEXT MD VISIT: none scheduled; seeing pain management PA in same practice  OBJECTIVE:  Note: Objective measures were completed at Evaluation unless otherwise noted.  DIAGNOSTIC FINDINGS:  none  PATIENT SURVEYS:  Modified Oswestry: 22 / 50 = 44.0 %   COGNITION: Overall cognitive status: Within functional limits for tasks assessed     SENSATION: WFL  MUSCLE LENGTH: Tightness in  B HS, quads, hip flexors, piriformis   POSTURE: decreased lumbar lordosis  PALPATION: Palpation:  TTP at B gluteals and piriformis, lumbar and B hip ADDuctors. Increased tissue tension in R lumbar, B gluteals Spinal Mobility: UPA mobs decreased due to muscle tightness, painful  at L4/5 Left, feels good on right   LUMBAR ROM:   AROM eval  Flexion full  Extension 50%  Right lateral flexion 14  Left lateral flexion 12  Right rotation 50%  Left rotation 50%   (Blank rows = not tested)  LOWER EXTREMITY ROM:   WNL for tasks assessed.   LOWER EXTREMITY MMT:  grossly 5/5 B; core weakness   LUMBAR SPECIAL TESTS:  Straight leg raise test: Negative and FABER test: Positive left  FUNCTIONAL TESTS:  5 times sit to stand: 8.63 sec  GAIT: WNL  TREATMENT DATE:                                                                                                                               03/07/24 Standing bil yellow tband pullbacks 2x10 (second set with PPT after below exercises helped with pain) to highlight core, added mini squat to stand with pullbacks x 3 burning in back Standing rows yellow tband x 10 a little pain at end  Standing march with alt march x 10 B - relief  Standing bil shoulder flexion yellow tband under one foot x10  B for core and deep multifdi activation - added PPT to help with pain Step up 6 with march to 3rd step light UE support on bil rails - reviewed and cues for TA Lateral step up 6 x12 light UE support each LE - no pain Hip flexor stretch standing with foot on 2nd step 2x30 each LE - feels good HS stretch with foot on 3rd step 2x30 sec B Sit to stand from chair with ball between knees x10 feels in back some  Standing T at counter x  10 each side Rockerboard x 1 min     03/03/24 DKTC and SKTC trial - Pt stated no change in lumbar burning Supine bridge 8x5 - feels good on lumbar spine but tight hamstrings Supine sciatic flossing x10 each LE using strap behind thigh  Prone lying x 2' with TA indraw cues intermittently - no improvement in lumbar burning Standing bil yellow tband pullbacks x10 to highlight core, added mini squat to stand with pullbacks x10 Standing bil shoulder flexion yellow tband x10 for core and deep multifdi  activation Step up 6 with march to 3rd step light UE support on bil rails - felt great on Rt LE but pain with Lt LE Lateral step up 4 x10 light UE support each LE - no pain Hip flexor stretch standing with foot on 2nd step 2x30 each LE - feels good Sit to stand from chair with ball between knees x10 - (ball helped Lt hip pain with initial reps w/o ball) Standing T at counter x8 each side Reviewed options for HS stretch - sitting and standing due to  pain with this in supine - preferred standing so she could stretch without bending trunk forward (updated HEP)  02/29/24 HS with strap and opp knee bent x 30 sec; modified to sciatic nerve tensioner x 10 with 5 sec hold ADDuctor LTR SKTC no problem Prone hip ext x 10 B Trigger Point Dry Needling  Initial Treatment: Pt instructed on Dry Needling rational, procedures, and possible side effects. Pt instructed to expect mild to moderate muscle soreness later in the day and/or into the next day.  Pt instructed in methods to reduce muscle soreness. Pt instructed to continue prescribed HEP. Because Dry Needling was performed over or adjacent to a lung field, pt was educated on S/S of pneumothorax and to seek immediate medical attention should they occur.  Patient was educated on signs and symptoms of infection and other risk factors and advised to seek medical attention should they occur.  Patient verbalized understanding of these instructions and education.   Patient Verbal Consent Given: Yes Education Handout Provided: Yes Muscles Treated: B UT, gluteals min/med/max Electrical Stimulation Performed: No Treatment Response/Outcome: Utilized skilled palpation to identify bony landmarks and trigger points.  Able to illicit twitch response and muscle elongation.  Soft tissue mobilization to muscles needled to further promote tissue elongation and decreased pain.        02/23/24 See pt ed and HEP    PATIENT EDUCATION:  Education details: PT eval  findings, anticipated POC, initial HEP, and TPDN rational, procedure, outcomes, potential side effects, and recommended post-treatment exercises/activity   Person educated: Patient Education method: Explanation, Demonstration, Tactile cues, Verbal cues, and Handouts Education comprehension: verbalized understanding and returned demonstration  HOME EXERCISE PROGRAM: Access Code: [email or web address] URL: [email or web address] Date: 03/07/2024 Prepared by: Mliss  Exercises - Supine ITB Stretch with Strap  - 2 x daily - 7 x weekly - 1 sets - 3 reps - 30-60 sec hold - Seated Piriformis Stretch  - 2 x daily - 7 x weekly - 1 sets - 3 reps - 30-60 sec hold - Hip Adductors and Hamstring Stretch with Strap  - 2 x daily - 7 x weekly - 1 sets - 3 reps - 30-6060 seconds hold - Supine Lower Trunk Rotation  - 2 x daily - 7 x weekly - 1 sets - 5 reps - 10 sec hold - Supine Single Knee to Chest Stretch  - 2 x daily - 7 x weekly - 1 sets - 3 reps - 15 hold - Prone Hip Extension  - 1 x daily - 3 x weekly - 2 sets - 10 reps - Hip Flexor Stretch with Chair  - 1 x daily - 7 x weekly - 1 sets - 2 reps - 20 hold - Standing Hamstring Stretch on Chair  - 1 x daily - 7 x weekly - 1 sets - 2 reps - 20-30 hold - Resistance Pulldown with March  - 1 x daily - 3 x weekly - 2 sets - 10 reps - Standing Bilateral Low Shoulder Row with Anchored Resistance  - 1 x daily - 3 x weekly - 2-3 sets - 10 reps  ASSESSMENT:  CLINICAL IMPRESSION: Patient presents with no pain, but experienced increase pain with standing exercises when not engaging her core or PPT. She tolerated other exercises well when her core was engaged. She wanted to delay DN until next visit due to her current round of steroids. She is scheduled for an injection on 1/29. She continues to demonstrate potential for improvement and would  benefit from continued skilled therapy to address remaining impairments.     Eval: Patient is a 74 y.o. female who was seen  today for physical therapy evaluation and treatment for lumbar radiculopathy. She also reports B neck pain (upper traps) since her fusion in July.  Patient demonstrates deficits in  lumbar and cervical ROM, core strength and LE flexibility.  Her Modified Oswestry score indicates severe disability at 44%.  Pain and deficits affect her ability to sit, drive and lie on her back. She also demonstrates L hip joint pain anteriorly and medially.  She will benefit from skilled PT to address these deficits and those listed below.  .   OBJECTIVE IMPAIRMENTS: decreased activity tolerance, decreased mobility, decreased ROM, decreased strength, hypomobility, increased muscle spasms, impaired flexibility, postural dysfunction, and pain.   ACTIVITY LIMITATIONS: carrying, lifting, bending, sitting, standing, squatting, bathing, toileting, dressing, hygiene/grooming, and locomotion level  PARTICIPATION LIMITATIONS: meal prep, cleaning, laundry, driving, shopping, and community activity  PERSONAL FACTORS: Age and 3+ comorbidities: neck fusion, lumbar surgeries, R knee scope, left hip joint pain are also affecting patient's functional outcome.   REHAB POTENTIAL: Excellent  CLINICAL DECISION MAKING: Evolving/moderate complexity  EVALUATION COMPLEXITY: Moderate   GOALS: Goals reviewed with patient? Yes  SHORT TERM GOALS: Target date: 03/22/2024   Patient will be independent with initial HEP.  Baseline:  Goal status: INITIAL  2.  Patient will be able to lie down without pain. Baseline:  Goal status: INITIAL  3.  Decreased back and hip pain by 25%. Baseline:  Goal status: INITIAL   LONG TERM GOALS: Target date: 04/19/2024   Patient will be independent with advanced/ongoing HEP to improve outcomes and carryover.  Baseline:  Goal status: INITIAL  2.  Patient will report 75% improvement in low back and hip pain with ADLs to improve QOL.  Baseline:  Goal status: INITIAL  3.  Patient will report  decreased pain with driving by 49% or more.  Baseline:  Goal status: INITIAL  4.   Patient will score 16 points or lower on the Modified Oswestry demonstrating improved functional ability.  Baseline: 22 Goal status: INITIAL  5.   Patient able to perform her HEP without restrictions from her left hip. Baseline:  Goal status: INITIAL     PLAN:  PT FREQUENCY: 2x/week  PT DURATION: 8 weeks  PLANNED INTERVENTIONS: 97110-Therapeutic exercises, 97530- Therapeutic activity, 97112- Neuromuscular re-education, 97535- Self Care, [id number]- Manual therapy, [phone]- Aquatic Therapy, [phone]- Electrical stimulation (unattended), [id number]- Ultrasound, [id number]- Traction (mechanical), [id number]- Ionotophoresis 4mg /ml Dexamethasone , [id number] (1-2 muscles), [id number] (3+ muscles)- Dry Needling, Patient/Family education, Taping, Joint mobilization, Spinal mobilization, Cryotherapy, and Moist heat.  PLAN FOR NEXT SESSION: review standing stretches added last time, Pt seems to do better in standing for therex with neutral spine posture.  Might benefit from repeat DN (Lt hip especially) paired with Lt hip distraction. Review and progress HEP, lumbar and left hip ROM,  possible left hip distraction, core strengthening,  continue prn: manual/DN to B lumbar, gluteals, piriformis and ADDuctors and UT  Mliss Cummins, PT  03/07/24 10:05 AM   "

## 2024-03-07 ENCOUNTER — Encounter: Payer: Self-pay | Admitting: Physical Therapy

## 2024-03-07 ENCOUNTER — Ambulatory Visit: Admitting: Physical Therapy

## 2024-03-07 DIAGNOSIS — M542 Cervicalgia: Secondary | ICD-10-CM

## 2024-03-07 DIAGNOSIS — R252 Cramp and spasm: Secondary | ICD-10-CM

## 2024-03-07 DIAGNOSIS — M5416 Radiculopathy, lumbar region: Secondary | ICD-10-CM

## 2024-03-07 DIAGNOSIS — M5459 Other low back pain: Secondary | ICD-10-CM

## 2024-03-07 DIAGNOSIS — M25552 Pain in left hip: Secondary | ICD-10-CM

## 2024-03-08 ENCOUNTER — Ambulatory Visit (HOSPITAL_BASED_OUTPATIENT_CLINIC_OR_DEPARTMENT_OTHER)
Admission: RE | Admit: 2024-03-08 | Discharge: 2024-03-08 | Disposition: A | Discharge status: Home / Self Care | Source: Ambulatory Visit | Attending: Obstetrics & Gynecology | Admitting: Obstetrics & Gynecology

## 2024-03-08 DIAGNOSIS — M85851 Other specified disorders of bone density and structure, right thigh: Secondary | ICD-10-CM | POA: Diagnosis present

## 2024-03-08 DIAGNOSIS — M85852 Other specified disorders of bone density and structure, left thigh: Secondary | ICD-10-CM | POA: Insufficient documentation

## 2024-03-09 ENCOUNTER — Telehealth (INDEPENDENT_AMBULATORY_CARE_PROVIDER_SITE_OTHER): Payer: Self-pay

## 2024-03-09 ENCOUNTER — Other Ambulatory Visit (INDEPENDENT_AMBULATORY_CARE_PROVIDER_SITE_OTHER): Payer: Self-pay | Admitting: Otolaryngology

## 2024-03-09 DIAGNOSIS — J324 Chronic pansinusitis: Secondary | ICD-10-CM

## 2024-03-09 NOTE — Telephone Encounter (Signed)
 Patient called call was answered. Patient stated that she finishes her Prednisone  Pack today, and she is still in pain and having discharge. She also states that there is some ear pressure as well. She would like to be advised on what else she can do to help relieve the pain and pressure she is experiencing she is going out of town today at 1:30 Pm today but will be back in town tomorrow. There is also yellow discharge that has been there since the second week in November.

## 2024-03-09 NOTE — Telephone Encounter (Signed)
 Returned patient call and  left her a voice mail that Dr. Karis put in an ordered for a sinus CT.

## 2024-03-10 ENCOUNTER — Other Ambulatory Visit (INDEPENDENT_AMBULATORY_CARE_PROVIDER_SITE_OTHER): Payer: Self-pay | Admitting: Otolaryngology

## 2024-03-10 MED ORDER — AZITHROMYCIN 250 MG PO TABS: ORAL_TABLET | ORAL | 0 refills | Status: AC

## 2024-03-10 NOTE — Telephone Encounter (Signed)
 Patient called back and stated that she had just finished her Prednisone  pack and needs an antibiotic sent over before she can get the scan done due to the weather this weekend.

## 2024-03-10 NOTE — Telephone Encounter (Signed)
 Returned patient phone call, told her that Dr. Karis called in Azithromycin  at [facility] on [address].

## 2024-03-11 ENCOUNTER — Encounter: Payer: Self-pay | Admitting: Physical Therapy

## 2024-03-11 ENCOUNTER — Ambulatory Visit: Admitting: Physical Therapy

## 2024-03-11 DIAGNOSIS — M5459 Other low back pain: Secondary | ICD-10-CM

## 2024-03-11 DIAGNOSIS — M5416 Radiculopathy, lumbar region: Secondary | ICD-10-CM

## 2024-03-11 DIAGNOSIS — R252 Cramp and spasm: Secondary | ICD-10-CM

## 2024-03-11 DIAGNOSIS — M542 Cervicalgia: Secondary | ICD-10-CM

## 2024-03-11 DIAGNOSIS — M25552 Pain in left hip: Secondary | ICD-10-CM

## 2024-03-11 NOTE — Therapy (Signed)
 " OUTPATIENT PHYSICAL THERAPY THORACOLUMBAR TREATMENT   Patient Name: Helen Glover MRN: [id number] DOB:15-Jun-1950, 74 y.o., female Today's Date: 03/11/2024  END OF SESSION:  PT End of Session - 03/11/24 0842     Visit Number 5    Date for Recertification  04/19/24    Authorization Type HTA    Progress Note Due on Visit 10    PT Start Time [id number]    PT Stop Time 0930    PT Time Calculation (min) 48 min    Activity Tolerance Patient tolerated treatment well    Behavior During Therapy [facility] for tasks assessed/performed              Past Medical History:  Diagnosis Date   Acquired hammer toe of right foot 09/25/2023   Acquired trigger finger of right index finger 09/23/2023   Allergy    Anxiety    Arthritis    in neck   Bursitis of right hip    Carpal tunnel syndrome, bilateral    rt wrist   Cataract    Cervical vertebral fusion    C1-2 posterior fusion   Complete rotator cuff tear of left shoulder    Depression    Difficult intubation 2015   pt states she was told she was a difficult intubation several years ago but has had several intubations since then with no issues   Eagle's syndrome    GERD (gastroesophageal reflux disease)    Hearing loss of both ears 12/25/2014   Has hearing aids/tubes    Heart murmur    as child   HNP (herniated nucleus pulposus)    Ingrown toenail of right foot 2023   Interstitial cystitis    Kidney cysts    followed by Dr. Cam   Osteopenia    Plantar fasciitis    Spinal stenosis    Tingling    rt foot   Wears hearing aid    Past Surgical History:  Procedure Laterality Date   ABDOMINAL HYSTERECTOMY  2003   BSO-fibroids   ANAL SPHINCTEROTOMY     CARPAL TUNNEL RELEASE     LEFT   CARPAL TUNNEL RELEASE Right 2025   and trigger finger   CERVICAL FUSION  02/25/2018   c1-c2 with laminectomy   CESAREAN SECTION     CYSTOSCOPY     HEMORRHOID SURGERY N/A 1998   KNEE ARTHROSCOPY Right 06/22/2019   Dr. Kay    LUMBAR  LAMINECTOMY     LUMBAR LAMINECTOMY/DECOMPRESSION MICRODISCECTOMY N/A 08/09/2014   Procedure: MICRO LUMBAR DECOMPRESSION L3-4,  REDO DECOMPRESSION, MICRODISCECTOMY L4-5, FORAMINOTOMY L3-L4, L4-L5 BILATERAL;  Surgeon: Reyes Billing, MD;  Location: [facility];  Service: Orthopedics;  Laterality: N/A;   MYRINGOTOMY     NASAL SINUS SURGERY     POSTERIOR CERVICAL FUSION/FORAMINOTOMY N/A 08/28/2023   Procedure: CERVICAL FOUR-FIVE POSTERIOR CERVICAL FUSION;  Surgeon: Joshua Alm Hamilton, MD;  Location: [facility] OR;  Service: Neurosurgery;  Laterality: N/A;  Posterior cervical fusion with lateral mass fixation - C4-C5, laminectomy C4-5   SPINE SURGERY     TONSILLECTOMY     LEFT only, due to Eagle Syndrome   TONSILLECTOMY  11/2012   right side due to [facility] syndrome   TRIGGER FINGER RELEASE     RIGHT   TYMPANOSTOMY TUBE PLACEMENT     Patient Active Problem List   Diagnosis Date Noted   Acute recurrent maxillary sinusitis 03/03/2024   Other specified disorders of eustachian tube, bilateral 12/15/2023   S/P cervical spinal fusion 08/28/2023  Central perforation of tympanic membrane of left ear 12/14/2022   Sensorineural hearing loss, bilateral 12/14/2022   Chronic rhinitis 12/14/2022   Postnasal drip 12/14/2022   Hemorrhoids 04/10/2021   Osteopenia 02/25/2021   Lumbar back pain with radiculopathy affecting right lower extremity 07/10/2020   Aortic atherosclerosis 07/07/2019   Hyperlipidemia    Hypertension    History of prediabetes    Vitamin D  deficiency    GERD (gastroesophageal reflux disease) 11/17/2011   Interstitial cystitis     PCP: Wendolyn Jenkins Jansky, MD   REFERRING PROVIDER: Joshua Alm Hamilton, MD   REFERRING DIAG: M54.16 (ICD-10-CM) - Radiculopathy, lumbar region   Rationale for Evaluation and Treatment: Rehabilitation  THERAPY DIAG:  Other low back pain  Radiculopathy, lumbar region  Cramp and spasm  Pain in left hip  Cervicalgia  ONSET DATE: Nov 2025  SUBJECTIVE:                                                                                                                                                                                            SUBJECTIVE STATEMENT: Having trouble with one of the exercises. I traveled to [facility] Wednesday. Going there was fine, coming back was    Eval:Currently in low back. Hurts to lie on it. Last spring I had really bad sciatica into L side. Got bad again before Christmas, in left low back and lying on it hurt, but waited it out and it went way. I have not been walking until today due to illness (1.5 months)  PERTINENT HISTORY:  Lumar laminectomy/decompressin L3-5,  C1/2 post fusion, C4/5 fusion July 2025  PAIN:  Are you having pain? Yes: NPRS scale: 1-2/10 today Pain location: low back Pain description: constant ache Aggravating factors: lying on it, sitting Relieving factors: walking and lying down  PRECAUTIONS: None  RED FLAGS: None   WEIGHT BEARING RESTRICTIONS: No  FALLS:  Has patient fallen in last 6 months? No  LIVING ENVIRONMENT: Lives with: lives with their family Lives in: House/apartment Stairs: Yes: Internal: 13 steps; can reach both and External: 2 steps; can reach both  OCCUPATION: retired  PLOF: Independent  PATIENT GOALS: alleviate some back pain and sit longer  NEXT MD VISIT: none scheduled; seeing pain management PA in same practice  OBJECTIVE:  Note: Objective measures were completed at Evaluation unless otherwise noted.  DIAGNOSTIC FINDINGS:  none  PATIENT SURVEYS:  Modified Oswestry: 22 / 50 = 44.0 %   COGNITION: Overall cognitive status: Within functional limits for tasks assessed     SENSATION: WFL  MUSCLE LENGTH: Tightness in  B HS, quads, hip flexors, piriformis  POSTURE: decreased lumbar lordosis  PALPATION: Palpation: TTP at B gluteals and piriformis, lumbar and B hip ADDuctors. Increased tissue tension in R lumbar, B gluteals Spinal Mobility: UPA mobs  decreased due to muscle tightness, painful at L4/5 Left, feels good on right   LUMBAR ROM:   AROM eval  Flexion full  Extension 50%  Right lateral flexion 14  Left lateral flexion 12  Right rotation 50%  Left rotation 50%   (Blank rows = not tested)  LOWER EXTREMITY ROM:   WNL for tasks assessed.   LOWER EXTREMITY MMT:  grossly 5/5 B; core weakness   LUMBAR SPECIAL TESTS:  Straight leg raise test: Negative and FABER test: Positive left  FUNCTIONAL TESTS:  5 times sit to stand: 8.63 sec  GAIT: WNL  TREATMENT DATE:                                                                                                                               03/11/24 Standing bil yellow tband pullbacks 2x10 from high in door Standing rows yellow x 10 Pallof with dowel and yellow band x 10 B, then with small rotation x 10 B   Trigger Point Dry Needling  Initial Treatment: Pt instructed on Dry Needling rational, procedures, and possible side effects. Pt instructed to expect mild to moderate muscle soreness later in the day and/or into the next day.  Pt instructed in methods to reduce muscle soreness. Pt instructed to continue prescribed HEP. Because Dry Needling was performed over or adjacent to a lung field, pt was educated on S/S of pneumothorax and to seek immediate medical attention should they occur.  Patient was educated on signs and symptoms of infection and other risk factors and advised to seek medical attention should they occur.  Patient verbalized understanding of these instructions and education.   Patient Verbal Consent Given: Yes Education Handout Provided: Yes Muscles Treated:  B UT and Adductors, L gluteals min/med/max, piriformis Electrical Stimulation Performed: No Treatment Response/Outcome: Utilized skilled palpation to identify bony landmarks and trigger points.  Able to illicit twitch response and muscle elongation.  Soft tissue mobilization to muscles needled to  further promote tissue elongation and decreased pain.      03/07/24 Standing bil yellow tband pullbacks 2x10 (second set with PPT after below exercises helped with pain) to highlight core, added mini squat to stand with pullbacks x 3 burning in back Standing rows yellow tband x 10 a little pain at end  Standing march with alt march x 10 B - relief  Standing bil shoulder flexion yellow tband under one foot x10  B for core and deep multifdi activation - added PPT to help with pain Step up 6 with march to 3rd step light UE support on bil rails - reviewed and cues for TA Lateral step up 6 x12 light UE support each LE - no pain Hip flexor stretch standing with foot on 2nd step 2x30 each  LE - feels good HS stretch with foot on 3rd step 2x30 sec B Sit to stand from chair with ball between knees x10 feels in back some  Standing T at counter x  10 each side Rockerboard x 1 min     03/03/24 DKTC and SKTC trial - Pt stated no change in lumbar burning Supine bridge 8x5 - feels good on lumbar spine but tight hamstrings Supine sciatic flossing x10 each LE using strap behind thigh  Prone lying x 2' with TA indraw cues intermittently - no improvement in lumbar burning Standing bil yellow tband pullbacks x10 to highlight core, added mini squat to stand with pullbacks x10 Standing bil shoulder flexion yellow tband x10 for core and deep multifdi activation Step up 6 with march to 3rd step light UE support on bil rails - felt great on Rt LE but pain with Lt LE Lateral step up 4 x10 light UE support each LE - no pain Hip flexor stretch standing with foot on 2nd step 2x30 each LE - feels good Sit to stand from chair with ball between knees x10 - (ball helped Lt hip pain with initial reps w/o ball) Standing T at counter x8 each side Reviewed options for HS stretch - sitting and standing due to pain with this in supine - preferred standing so she could stretch without bending trunk forward (updated  HEP)  02/29/24 HS with strap and opp knee bent x 30 sec; modified to sciatic nerve tensioner x 10 with 5 sec hold ADDuctor LTR SKTC no problem Prone hip ext x 10 B Trigger Point Dry Needling  Initial Treatment: Pt instructed on Dry Needling rational, procedures, and possible side effects. Pt instructed to expect mild to moderate muscle soreness later in the day and/or into the next day.  Pt instructed in methods to reduce muscle soreness. Pt instructed to continue prescribed HEP. Because Dry Needling was performed over or adjacent to a lung field, pt was educated on S/S of pneumothorax and to seek immediate medical attention should they occur.  Patient was educated on signs and symptoms of infection and other risk factors and advised to seek medical attention should they occur.  Patient verbalized understanding of these instructions and education.   Patient Verbal Consent Given: Yes Education Handout Provided: Yes Muscles Treated: B UT, gluteals min/med/max Electrical Stimulation Performed: No Treatment Response/Outcome: Utilized skilled palpation to identify bony landmarks and trigger points.  Able to illicit twitch response and muscle elongation.  Soft tissue mobilization to muscles needled to further promote tissue elongation and decreased pain.        02/23/24 See pt ed and HEP    PATIENT EDUCATION:  Education details: PT eval findings, anticipated POC, initial HEP, and TPDN rational, procedure, outcomes, potential side effects, and recommended post-treatment exercises/activity   Person educated: Patient Education method: Explanation, Demonstration, Tactile cues, Verbal cues, and Handouts Education comprehension: verbalized understanding and returned demonstration  HOME EXERCISE PROGRAM: Access Code: [email or web address] URL: [email or web address] Date: 03/07/2024 Prepared by: Mliss  Exercises - Supine ITB Stretch with Strap  - 2 x daily - 7 x weekly - 1 sets - 3 reps -  30-60 sec hold - Seated Piriformis Stretch  - 2 x daily - 7 x weekly - 1 sets - 3 reps - 30-60 sec hold - Hip Adductors and Hamstring Stretch with Strap  - 2 x daily - 7 x weekly - 1 sets - 3 reps - 30-6060 seconds hold - Supine Lower Trunk Rotation  -  2 x daily - 7 x weekly - 1 sets - 5 reps - 10 sec hold - Supine Single Knee to Chest Stretch  - 2 x daily - 7 x weekly - 1 sets - 3 reps - 15 hold - Prone Hip Extension  - 1 x daily - 3 x weekly - 2 sets - 10 reps - Hip Flexor Stretch with Chair  - 1 x daily - 7 x weekly - 1 sets - 2 reps - 20 hold - Standing Hamstring Stretch on Chair  - 1 x daily - 7 x weekly - 1 sets - 2 reps - 20-30 hold - Resistance Pulldown with March  - 1 x daily - 3 x weekly - 2 sets - 10 reps - Standing Bilateral Low Shoulder Row with Anchored Resistance  - 1 x daily - 3 x weekly - 2-3 sets - 10 reps  ASSESSMENT:  CLINICAL IMPRESSION: Helen Glover had good tolerance to exercise today with no significant increase in pain. Pallof exercises caused some discomfort in the left low back, but this was minor. Excellent response to DN in the R UT and L hip today. Adductors as well with immediate decrease in tissue tension. Patient scheduled for injection next Thursday 1/29.    Eval: Patient is a 74 y.o. female who was seen today for physical therapy evaluation and treatment for lumbar radiculopathy. She also reports B neck pain (upper traps) since her fusion in July.  Patient demonstrates deficits in  lumbar and cervical ROM, core strength and LE flexibility.  Her Modified Oswestry score indicates severe disability at 44%.  Pain and deficits affect her ability to sit, drive and lie on her back. She also demonstrates L hip joint pain anteriorly and medially.  She will benefit from skilled PT to address these deficits and those listed below.  .   OBJECTIVE IMPAIRMENTS: decreased activity tolerance, decreased mobility, decreased ROM, decreased strength, hypomobility, increased muscle spasms,  impaired flexibility, postural dysfunction, and pain.   ACTIVITY LIMITATIONS: carrying, lifting, bending, sitting, standing, squatting, bathing, toileting, dressing, hygiene/grooming, and locomotion level  PARTICIPATION LIMITATIONS: meal prep, cleaning, laundry, driving, shopping, and community activity  PERSONAL FACTORS: Age and 3+ comorbidities: neck fusion, lumbar surgeries, R knee scope, left hip joint pain are also affecting patient's functional outcome.   REHAB POTENTIAL: Excellent  CLINICAL DECISION MAKING: Evolving/moderate complexity  EVALUATION COMPLEXITY: Moderate   GOALS: Goals reviewed with patient? Yes  SHORT TERM GOALS: Target date: 03/22/2024   Patient will be independent with initial HEP.  Baseline:  Goal status: INITIAL  2.  Patient will be able to lie down without pain. Baseline:  Goal status: INITIAL  3.  Decreased back and hip pain by 25%. Baseline:  Goal status: INITIAL   LONG TERM GOALS: Target date: 04/19/2024   Patient will be independent with advanced/ongoing HEP to improve outcomes and carryover.  Baseline:  Goal status: INITIAL  2.  Patient will report 75% improvement in low back and hip pain with ADLs to improve QOL.  Baseline:  Goal status: INITIAL  3.  Patient will report decreased pain with driving by 49% or more.  Baseline:  Goal status: INITIAL  4.   Patient will score 16 points or lower on the Modified Oswestry demonstrating improved functional ability.  Baseline: 22 Goal status: INITIAL  5.   Patient able to perform her HEP without restrictions from her left hip. Baseline:  Goal status: INITIAL     PLAN:  PT FREQUENCY: 2x/week  PT  DURATION: 8 weeks  PLANNED INTERVENTIONS: 97110-Therapeutic exercises, 97530- Therapeutic activity, [id number]- Neuromuscular re-education, 97535- Self Care, [id number]- Manual therapy, [phone]- Aquatic Therapy, [id number]- Electrical stimulation (unattended), [id number]- Ultrasound, [id number]- Traction (mechanical),  [id number]- Ionotophoresis 4mg /ml Dexamethasone , [id number] (1-2 muscles), [id number] (3+ muscles)- Dry Needling, Patient/Family education, Taping, Joint mobilization, Spinal mobilization, Cryotherapy, and Moist heat.  PLAN FOR NEXT SESSION: review standing stretches added last time, Pt seems to do better in standing for therex with neutral spine posture.  Might benefit from repeat DN (Lt hip especially) paired with Lt hip distraction. Review and progress HEP, lumbar and left hip ROM,  possible left hip distraction, core strengthening,  continue prn: manual/DN to B lumbar, gluteals, piriformis and ADDuctors and UT  Mliss Cummins, PT  03/11/24 9:35 AM   "

## 2024-03-14 ENCOUNTER — Ambulatory Visit: Admitting: Physical Therapy

## 2024-03-16 ENCOUNTER — Encounter (HOSPITAL_BASED_OUTPATIENT_CLINIC_OR_DEPARTMENT_OTHER): Payer: Self-pay | Admitting: Obstetrics & Gynecology

## 2024-03-18 ENCOUNTER — Ambulatory Visit

## 2024-03-18 ENCOUNTER — Ambulatory Visit (HOSPITAL_COMMUNITY)
Admission: RE | Admit: 2024-03-18 | Discharge: 2024-03-18 | Disposition: A | Source: Ambulatory Visit | Attending: Otolaryngology | Admitting: Otolaryngology

## 2024-03-18 DIAGNOSIS — J324 Chronic pansinusitis: Secondary | ICD-10-CM | POA: Insufficient documentation

## 2024-03-21 ENCOUNTER — Ambulatory Visit: Admitting: Physical Therapy

## 2024-03-24 ENCOUNTER — Ambulatory Visit: Admitting: Physical Therapy

## 2024-03-28 ENCOUNTER — Ambulatory Visit: Admitting: Physical Therapy

## 2024-03-31 ENCOUNTER — Ambulatory Visit: Admitting: Physical Therapy

## 2024-04-04 ENCOUNTER — Ambulatory Visit: Admitting: Physical Therapy

## 2024-04-06 ENCOUNTER — Ambulatory Visit (HOSPITAL_BASED_OUTPATIENT_CLINIC_OR_DEPARTMENT_OTHER): Admitting: Obstetrics & Gynecology

## 2024-04-07 ENCOUNTER — Ambulatory Visit

## 2024-04-08 ENCOUNTER — Ambulatory Visit (HOSPITAL_BASED_OUTPATIENT_CLINIC_OR_DEPARTMENT_OTHER): Admitting: Obstetrics & Gynecology

## 2024-05-05 ENCOUNTER — Ambulatory Visit: Admitting: Neurology

## 2024-06-16 ENCOUNTER — Ambulatory Visit (INDEPENDENT_AMBULATORY_CARE_PROVIDER_SITE_OTHER): Admitting: Otolaryngology

## 2024-08-29 ENCOUNTER — Ambulatory Visit: Admitting: Family Medicine

## 2024-09-20 ENCOUNTER — Ambulatory Visit

## 2025-02-23 ENCOUNTER — Encounter: Admitting: Family Medicine
